# Patient Record
Sex: Male | Born: 1955 | Race: White | Hispanic: No | Marital: Married | Smoking: Never smoker
Health system: Southern US, Community
[De-identification: ages and names within clinical notes are randomized; demographics above are authoritative.]

## PROBLEM LIST (undated history)

## (undated) DIAGNOSIS — I499 Cardiac arrhythmia, unspecified: Secondary | ICD-10-CM

## (undated) DIAGNOSIS — I509 Heart failure, unspecified: Secondary | ICD-10-CM

## (undated) DIAGNOSIS — I4891 Unspecified atrial fibrillation: Secondary | ICD-10-CM

## (undated) DIAGNOSIS — G4730 Sleep apnea, unspecified: Secondary | ICD-10-CM

## (undated) DIAGNOSIS — J302 Other seasonal allergic rhinitis: Secondary | ICD-10-CM

## (undated) DIAGNOSIS — Z87442 Personal history of urinary calculi: Secondary | ICD-10-CM

## (undated) DIAGNOSIS — N400 Enlarged prostate without lower urinary tract symptoms: Secondary | ICD-10-CM

## (undated) DIAGNOSIS — K52832 Lymphocytic colitis: Secondary | ICD-10-CM

## (undated) DIAGNOSIS — E669 Obesity, unspecified: Secondary | ICD-10-CM

## (undated) DIAGNOSIS — K219 Gastro-esophageal reflux disease without esophagitis: Secondary | ICD-10-CM

## (undated) DIAGNOSIS — E782 Mixed hyperlipidemia: Secondary | ICD-10-CM

## (undated) DIAGNOSIS — I4819 Other persistent atrial fibrillation: Secondary | ICD-10-CM

## (undated) DIAGNOSIS — N200 Calculus of kidney: Secondary | ICD-10-CM

## (undated) DIAGNOSIS — IMO0002 Reserved for concepts with insufficient information to code with codable children: Secondary | ICD-10-CM

## (undated) DIAGNOSIS — I10 Essential (primary) hypertension: Secondary | ICD-10-CM

## (undated) HISTORY — DX: Lymphocytic colitis: K52.832

## (undated) HISTORY — DX: Calculus of kidney: N20.0

## (undated) HISTORY — DX: Obesity, unspecified: E66.9

## (undated) HISTORY — DX: Benign prostatic hyperplasia without lower urinary tract symptoms: N40.0

## (undated) HISTORY — DX: Essential (primary) hypertension: I10

## (undated) HISTORY — DX: Other seasonal allergic rhinitis: J30.2

## (undated) HISTORY — DX: Gastro-esophageal reflux disease without esophagitis: K21.9

## (undated) HISTORY — DX: Reserved for concepts with insufficient information to code with codable children: IMO0002

## (undated) HISTORY — DX: Mixed hyperlipidemia: E78.2

## (undated) HISTORY — DX: Other persistent atrial fibrillation: I48.19

---

## 2007-10-01 ENCOUNTER — Emergency Department (HOSPITAL_COMMUNITY): Admission: EM | Admit: 2007-10-01 | Discharge: 2007-10-01 | Payer: Self-pay | Admitting: Emergency Medicine

## 2008-01-12 ENCOUNTER — Ambulatory Visit (HOSPITAL_COMMUNITY): Admission: RE | Admit: 2008-01-12 | Discharge: 2008-01-13 | Payer: Self-pay | Admitting: Surgery

## 2008-09-23 ENCOUNTER — Emergency Department (HOSPITAL_COMMUNITY): Admission: EM | Admit: 2008-09-23 | Discharge: 2008-09-23 | Payer: Self-pay | Admitting: Emergency Medicine

## 2008-09-26 ENCOUNTER — Encounter: Admission: RE | Admit: 2008-09-26 | Discharge: 2008-09-26 | Payer: Self-pay | Admitting: Orthopedic Surgery

## 2008-09-27 ENCOUNTER — Encounter: Admission: RE | Admit: 2008-09-27 | Discharge: 2008-09-27 | Payer: Self-pay | Admitting: Orthopedic Surgery

## 2010-06-09 NOTE — Op Note (Signed)
[name], [name]                 ACCOUNT NO.:  [id number]   MEDICAL RECORD NO.:  [id number]          PATIENT TYPE:  OIB   LOCATION:  [id number]                         FACILITY:  [facility]   PHYSICIAN:  Isabel Caprice. Hassell Done, MD  DATE OF BIRTH:  Apr 02, 1955   DATE OF PROCEDURE:  01/12/2008  DATE OF DISCHARGE:                               OPERATIVE REPORT   PREOPERATIVE INDICATIONS:  Recurrent left inguinal hernia.  The patient  is 55 year old white male who had bilateral inguinal hernia repairs in  1992 by Dr. Elesa Hacker, done open.  He has had a bulging mass in his left  groin getting bigger for the last 2 years.   PROCEDURE:  Laparoscopic left inguinal hernia repair with extra large  Davol 3DMax mesh (large on back order).   SURGEON:  Isabel Caprice. Hassell Done, M.D.   ASSISTANT:  None.   ANESTHESIA:  General.   DESCRIPTION OF PROCEDURE:  [name] was taken to room-1 on Friday,  January 12, 2008, and given general anesthesia.  The abdomen was  prepped with a Techni-Care equivalent by using a chlorhexidine base  prep.  Previously he had clipped himself the night before.  I made a  transverse incision slightly to the left of the umbilicus and did a  digital dissection down along the rectus and then inserted the balloon  dissector.  The scope was then inserted behind that and it was  insufflated.  I got a reasonably good dissection, although because he  had bilateral hernias, there was a rent on the right side that enabled  gas to get into the peritoneum so we had a pneumoperitoneum.  I  counteracted that with a Veress needle placed on the left side which  adequately deflated the abdomen.  I then put my two 5-mm trocars in  place under laparoscopic vision and with those I then began dissecting  this large hernia that was bulging out about the size between a plum and  an orange size mass in his left groin.  Initially it did not want to  come back but I went ahead and worked diligently on the  inside and  gently tugged and eventually got complete reduction of this mass, which  likely was more probably omental material that was stuck up in that  area.  It left this ring-sized defect which I have photographed and  placed in the chart with the large sac.  The sac went back out into this  little sac but the ring was very visible.  I then had everything  dissected nicely.  Because the large 3DMax mesh was on back order, I got  an extra large left.  I inserted this, tacked it across the midline, and  tacked along Cooper ligament as well as all around the hernia defect and  got good coverage.  Laterally where ever I tacked I could feel where the  tacker was so as not to get the lateral femoral cutaneous nerve.  Good  deployment and good coverage of the hernia defect was present.  It was  interesting, in dissecting the sac  I found what appeared to be a Prolene  suture that had actually broke and the knot appeared intact.   Area looked to be good.  There was no bleeding noted.  I then deflated  the abdomen and the preperitoneal space.  The fascial defect on the left  side was closed with a 0 Vicryl and then the wounds were closed with 4-0  Vicryl with  Benzoin and Steri-Strips.  The patient seemed to tolerate the procedure  well and was taken to the recovery room in satisfactory condition.  He  will be given a prescription for Tylox to take if needed for pain.  He  will be followed up in the office in a couple of weeks.      Isabel Caprice Hassell Done, MD  Electronically Signed     MBM/MEDQ  D:  01/12/2008  T:  01/13/2008  Job:  [id number]   cc:   Delanna Ahmadi, M.D.  Fax: [phone]

## 2010-10-30 LAB — BASIC METABOLIC PANEL
CO2: 26 mEq/L (ref 19–32)
Calcium: 9 mg/dL (ref 8.4–10.5)
Chloride: 107 mEq/L (ref 96–112)
Potassium: 4.1 mEq/L (ref 3.5–5.1)
Sodium: 138 mEq/L (ref 135–145)

## 2010-10-30 LAB — HEMOGLOBIN AND HEMATOCRIT, BLOOD
HCT: 42.6 % (ref 39.0–52.0)
Hemoglobin: 14.3 g/dL (ref 13.0–17.0)

## 2011-07-26 DIAGNOSIS — K52832 Lymphocytic colitis: Secondary | ICD-10-CM

## 2011-07-26 HISTORY — DX: Lymphocytic colitis: K52.832

## 2012-01-26 DIAGNOSIS — N400 Enlarged prostate without lower urinary tract symptoms: Secondary | ICD-10-CM

## 2012-01-26 HISTORY — DX: Benign prostatic hyperplasia without lower urinary tract symptoms: N40.0

## 2012-04-25 DIAGNOSIS — N200 Calculus of kidney: Secondary | ICD-10-CM

## 2012-04-25 HISTORY — DX: Calculus of kidney: N20.0

## 2015-02-20 ENCOUNTER — Other Ambulatory Visit: Payer: Self-pay | Admitting: Internal Medicine

## 2015-02-20 ENCOUNTER — Ambulatory Visit
Admission: RE | Admit: 2015-02-20 | Discharge: 2015-02-20 | Disposition: A | Discharge status: Home / Self Care | Payer: BLUE CROSS/BLUE SHIELD | Source: Ambulatory Visit | Attending: Internal Medicine | Admitting: Internal Medicine

## 2015-02-20 DIAGNOSIS — M25571 Pain in right ankle and joints of right foot: Secondary | ICD-10-CM

## 2015-08-08 DIAGNOSIS — R972 Elevated prostate specific antigen [PSA]: Secondary | ICD-10-CM | POA: Diagnosis not present

## 2015-08-08 DIAGNOSIS — N401 Enlarged prostate with lower urinary tract symptoms: Secondary | ICD-10-CM | POA: Diagnosis not present

## 2015-08-08 DIAGNOSIS — R351 Nocturia: Secondary | ICD-10-CM | POA: Diagnosis not present

## 2015-08-12 DIAGNOSIS — I10 Essential (primary) hypertension: Secondary | ICD-10-CM | POA: Diagnosis not present

## 2015-09-02 DIAGNOSIS — E782 Mixed hyperlipidemia: Secondary | ICD-10-CM | POA: Diagnosis not present

## 2015-09-02 DIAGNOSIS — K219 Gastro-esophageal reflux disease without esophagitis: Secondary | ICD-10-CM | POA: Diagnosis not present

## 2015-09-02 DIAGNOSIS — Z0000 Encounter for general adult medical examination without abnormal findings: Secondary | ICD-10-CM | POA: Diagnosis not present

## 2015-09-02 DIAGNOSIS — I10 Essential (primary) hypertension: Secondary | ICD-10-CM | POA: Diagnosis not present

## 2015-09-08 DIAGNOSIS — M25571 Pain in right ankle and joints of right foot: Secondary | ICD-10-CM | POA: Diagnosis not present

## 2016-01-13 DIAGNOSIS — J9801 Acute bronchospasm: Secondary | ICD-10-CM | POA: Diagnosis not present

## 2016-01-13 DIAGNOSIS — J209 Acute bronchitis, unspecified: Secondary | ICD-10-CM | POA: Diagnosis not present

## 2016-03-12 ENCOUNTER — Other Ambulatory Visit: Payer: Self-pay | Admitting: Internal Medicine

## 2016-03-12 DIAGNOSIS — I480 Paroxysmal atrial fibrillation: Secondary | ICD-10-CM

## 2016-03-16 NOTE — Progress Notes (Signed)
Cardiology Office Note   Date:  03/26/2016   ID:  [name], DOB [birth year], MRN [id number]  PCP:  Irven Shelling, MD  Cardiologist:   Jenkins Rouge, MD   Chief Complaint  Patient presents with  . Atrial Fibrillation  . Establish Care      History of Present Illness: [name] is a 61 y.o. male who presents for evaluation of afib.  Seen by Dr Laurann Montana 03/09/16 started on cardizem And eliquis  History of HTN and asthma .  LDL is 119.  Cr 1.25 K 4.0 PLT 214 Hct 41.6 TSH 1.14 03/03/16   Echo 03/17/16 EF 25-30%  Diffuse hypokinesis Root 40 mm Mild MR  LA/RA  severely dilated  RV moderately dilated    Dyspnea much worse last 2 months no chest pain palpitations or syncope does have PND and orthopnea with LE edema  Has been taking eliquis Since 03/03/16  No bleeding issues. No family history of DCM. Denies ETOH.    Long discussion with him about afib. Strategies of rate control, anticoagulation and rhythm control This patients CHA2DS2-VASc Score and unadjusted Ischemic Stroke Rate (% per year) is equal to 2.2 % stroke rate/year from a score of 2  Above score calculated as 1 point each if present [CHF, HTN, DM, Vascular=MI/PAD/Aortic Plaque, Age if 65-74, or Male] Above score calculated as 2 points each if present [Age > 75, or Stroke/TIA/TE]  Also long discussion about diagnosis of CHF and cardiomyopathy. Need for ischemic w/u and conversion To NSR if possible for AV synchrony and improved EF   Past Medical History:  Diagnosis Date  . Asthma, persistent   . BPH (benign prostatic hyperplasia) 2014  . Combined hyperlipidemia   . GERD (gastroesophageal reflux disease)   . Hypertension   . 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 Obesity   . Renal stone 04/2012  . Seasonal allergic rhinitis     History reviewed. No pertinent surgical history.   Current Outpatient Prescriptions  Medication Sig Dispense Refill  . albuterol (PROVENTIL HFA;VENTOLIN HFA) 108  (90 Base) MCG/ACT inhaler Inhale 2 puffs into the lungs every 6 (six) hours as needed for wheezing or shortness of breath.    Marland Kitchen apixaban (ELIQUIS) 5 MG TABS tablet Take 5 mg by mouth 2 (two) times daily.    . Avanafil 200 MG TABS Take 1 tablet by mouth daily as needed.    . carvedilol (COREG) 6.25 MG tablet Take 1 tablet (6.25 mg total) by mouth 2 (two) times daily. 180 tablet 3  . cholestyramine (QUESTRAN) 4 GM/DOSE powder Take 4 g by mouth 2 (two) times daily as needed.    . fluticasone furoate-vilanterol (BREO ELLIPTA) 100-25 MCG/INH AEPB Inhale 1 puff into the lungs daily.    . irbesartan (AVAPRO) 300 MG tablet Take 300 mg by mouth daily.    . pantoprazole (PROTONIX) 40 MG tablet Take 40 mg by mouth daily.    . furosemide (LASIX) 20 MG tablet Take 1 tablet (20 mg total) by mouth daily. 90 tablet 3   No current facility-administered medications for this visit.     Allergies:   Lisinopril    Social History:  The patient  reports that he has never smoked. He has never used smokeless tobacco. He reports that he does not drink alcohol or use drugs.   Family History:  The patient's family history is not on file.    ROS:  Please see the history of present illness.  Otherwise, review of systems are positive for none.   All other systems are reviewed and negative.    PHYSICAL EXAM: VS:  BP 126/90   Pulse 71   Ht 6' (1.829 m)   Wt 244 lb 6.4 oz (110.9 kg)   SpO2 97%   BMI 33.15 kg/m  , BMI Body mass index is 33.15 kg/m. Affect appropriate Healthy:  appears stated age 57: normal Neck supple with no adenopathy JVP normal no bruits no thyromegaly Lungs clear with no wheezing and good diaphragmatic motion Heart:  S1/S2 no murmur, no rub, gallop or click PMI normal Abdomen: benighn, BS positve, no tenderness, no AAA no bruit.  No HSM or HJR Distal pulses intact with no bruits Plus one bilateral edema Neuro non-focal Skin warm and dry No muscular weakness    EKG:  Afib rate  97 ICRBBB no MI    Recent Labs: No results found for requested labs within last 8760 hours.    Lipid Panel No results found for: CHOL, TRIG, HDL, CHOLHDL, VLDL, LDLCALC, LDLDIRECT    Wt Readings from Last 3 Encounters:  03/26/16 244 lb 6.4 oz (110.9 kg)      Other studies Reviewed: Additional studies/ records that were reviewed today include: Notes [facility] Laurann Montana labs .    ASSESSMENT AND PLAN:  1.  Afib: stop cardizem increase coreg to 6.25 bid continue eliquis. Scheduled [place] for 04/07/16 Will be 5 weeks on eliquis. Risk of stroke pacing and need for intubation discussed Endoscopy Called and scheduled for 3/14 2:00 pm   2. Decreased EF continue Avapro cough with ACE increase coreg add lasix 20 mg will need myovue after [place]   3. Dyspnea previously related to asthma now afib and DCM see med changes above   4. Anticoagulation  Continue eliquis on since 03/03/16 has used samples from Dr Laurann Montana  5. HTN:  .nlp  6. Lipids  On [place] labs with Dr Laurann Montana    Current medicines are reviewed at length with the patient today.  The patient does not have concerns regarding medicines.  The following changes have been made:  See above   Labs/ tests ordered today include: Pre [facility] labs   Orders Placed This Encounter  Procedures  . Protime-INR  . CBC with Differential/Platelet  . Basic metabolic panel  . EKG 12-Lead     Disposition:   FU with me post [facility]      Signed, Jenkins Rouge, MD  03/26/2016 2:55 PM    [facility] [place], [place], [place]  [address] Phone: [phone]; Fax: [phone]

## 2016-03-17 ENCOUNTER — Encounter (HOSPITAL_COMMUNITY): Payer: Self-pay

## 2016-03-17 ENCOUNTER — Ambulatory Visit (HOSPITAL_COMMUNITY): Payer: Self-pay | Attending: Internal Medicine

## 2016-03-17 ENCOUNTER — Encounter: Payer: Self-pay | Admitting: Cardiovascular Disease

## 2016-03-17 ENCOUNTER — Other Ambulatory Visit: Payer: Self-pay

## 2016-03-17 DIAGNOSIS — I77819 Aortic ectasia, unspecified site: Secondary | ICD-10-CM | POA: Insufficient documentation

## 2016-03-17 DIAGNOSIS — I480 Paroxysmal atrial fibrillation: Secondary | ICD-10-CM | POA: Insufficient documentation

## 2016-03-17 DIAGNOSIS — I517 Cardiomegaly: Secondary | ICD-10-CM | POA: Insufficient documentation

## 2016-03-17 NOTE — Progress Notes (Signed)
[name] presented for echocardiogram this afternoon referred by Dr. [name] from [facility]. During the test, his HR was noted between 100 and 140 bpm with EF of 25%. DOD (Dr. Angelena Form) was notified and he recommended to call his primary doctor and see if there is another type of medicine he can take to control his HR and recommended to stick with his appointment with Dr. Johnsie Cancel on 03/26/16.  Wyatt Mage, [place] 03/17/2016

## 2016-03-26 ENCOUNTER — Encounter (INDEPENDENT_AMBULATORY_CARE_PROVIDER_SITE_OTHER): Payer: Self-pay

## 2016-03-26 ENCOUNTER — Encounter: Payer: Self-pay | Admitting: Cardiovascular Disease

## 2016-03-26 ENCOUNTER — Ambulatory Visit (INDEPENDENT_AMBULATORY_CARE_PROVIDER_SITE_OTHER): Payer: PRIVATE HEALTH INSURANCE | Admitting: Cardiovascular Disease

## 2016-03-26 VITALS — BP 126/90 | HR 71 | Ht 72.0 in | Wt 244.4 lb

## 2016-03-26 DIAGNOSIS — I4891 Unspecified atrial fibrillation: Secondary | ICD-10-CM

## 2016-03-26 DIAGNOSIS — Z01812 Encounter for preprocedural laboratory examination: Secondary | ICD-10-CM

## 2016-03-26 DIAGNOSIS — Z7689 Persons encountering health services in other specified circumstances: Secondary | ICD-10-CM

## 2016-03-26 MED ORDER — CARVEDILOL 6.25 MG PO TABS: 6.2500 mg | ORAL_TABLET | Freq: Two times a day (BID) | ORAL | 3 refills | Status: DC

## 2016-03-26 MED ORDER — FUROSEMIDE 20 MG PO TABS: 20.0000 mg | ORAL_TABLET | Freq: Every day | ORAL | 3 refills | Status: DC

## 2016-03-26 NOTE — Patient Instructions (Addendum)
Medication Instructions:  Your physician has recommended you make the following change in your medication:  1-STOP cardizem 2-INCREASE Coreg 6.25 mg by mouth twice daily 3-START Lasix 20 mg by mouth daily   Labwork: Your physician recommends that you return for lab work on 04/05/16 for BMET, CBC, PT/INR  Testing/Procedures: Your physician has recommended that you have a Cardioversion (DCCV). Electrical Cardioversion uses a jolt of electricity to your heart either through paddles or wired patches attached to your chest. This is a controlled, usually prescheduled, procedure. Defibrillation is done under light anesthesia in the hospital, and you usually go home the day of the procedure. This is done to get your heart back into a normal rhythm. You are not awake for the procedure. Please see the instruction sheet given to you today.  Follow-Up: Your physician wants you to follow-up in: 1 month with Dr. Johnsie Cancel or PA/NP.   If you need a refill on your cardiac medications before your next appointment, please call your pharmacy.

## 2016-03-29 ENCOUNTER — Telehealth: Payer: Self-pay | Admitting: Cardiovascular Disease

## 2016-03-29 DIAGNOSIS — R0602 Shortness of breath: Secondary | ICD-10-CM

## 2016-03-29 NOTE — Telephone Encounter (Signed)
Patient is having problems breathing when he lays down at night. Patient stated this is not new, but has not improved with the medication changes at his office visit last Friday.  1-STOP cardizem 2-INCREASE Coreg 6.25 mg by mouth twice daily 3-START Lasix 20 mg by mouth daily   Patient stated that one morning he took coreg early at 2:00 am and that did help. Patient stated he is fine through out the day when he is ambulating or sitting. Patient was wondering if his dose needed increasing. Will forward to Dr. Johnsie Cancel for advisement.

## 2016-03-29 NOTE — Telephone Encounter (Signed)
No have him come in for CXR BMET and BNP first

## 2016-03-29 NOTE — Telephone Encounter (Signed)
Left message for patient to call back  

## 2016-03-29 NOTE — Telephone Encounter (Signed)
New Message:   Pt says he can not sleep at night ,when he laid down he can not breathe.Pt wonder if his medicine needs to be changed?

## 2016-03-29 NOTE — Telephone Encounter (Signed)
Patient will come in for lab work tomorrow and have chest xray done tomorrow as well.

## 2016-03-30 ENCOUNTER — Other Ambulatory Visit (INDEPENDENT_AMBULATORY_CARE_PROVIDER_SITE_OTHER): Payer: PRIVATE HEALTH INSURANCE

## 2016-03-30 ENCOUNTER — Ambulatory Visit
Admission: RE | Admit: 2016-03-30 | Discharge: 2016-03-30 | Disposition: A | Discharge status: Home / Self Care | Payer: PRIVATE HEALTH INSURANCE | Source: Ambulatory Visit | Attending: Cardiovascular Disease | Admitting: Cardiovascular Disease

## 2016-03-30 DIAGNOSIS — R0602 Shortness of breath: Secondary | ICD-10-CM

## 2016-03-30 DIAGNOSIS — Z01812 Encounter for preprocedural laboratory examination: Secondary | ICD-10-CM

## 2016-03-30 DIAGNOSIS — I4891 Unspecified atrial fibrillation: Secondary | ICD-10-CM | POA: Diagnosis not present

## 2016-03-31 ENCOUNTER — Telehealth: Payer: Self-pay | Admitting: Cardiovascular Disease

## 2016-03-31 ENCOUNTER — Encounter (HOSPITAL_COMMUNITY): Payer: Self-pay | Admitting: Emergency Medicine

## 2016-03-31 ENCOUNTER — Emergency Department (HOSPITAL_COMMUNITY): Payer: PRIVATE HEALTH INSURANCE

## 2016-03-31 ENCOUNTER — Emergency Department (HOSPITAL_COMMUNITY)
Admission: EM | Admit: 2016-03-31 | Discharge: 2016-03-31 | Disposition: A | Discharge status: Home / Self Care | Payer: PRIVATE HEALTH INSURANCE | Attending: Emergency Medicine | Admitting: Emergency Medicine

## 2016-03-31 DIAGNOSIS — I10 Essential (primary) hypertension: Secondary | ICD-10-CM | POA: Diagnosis not present

## 2016-03-31 DIAGNOSIS — I4891 Unspecified atrial fibrillation: Secondary | ICD-10-CM | POA: Diagnosis not present

## 2016-03-31 DIAGNOSIS — R0602 Shortness of breath: Secondary | ICD-10-CM | POA: Diagnosis present

## 2016-03-31 DIAGNOSIS — Z7901 Long term (current) use of anticoagulants: Secondary | ICD-10-CM | POA: Insufficient documentation

## 2016-03-31 DIAGNOSIS — J45909 Unspecified asthma, uncomplicated: Secondary | ICD-10-CM | POA: Insufficient documentation

## 2016-03-31 DIAGNOSIS — Z79899 Other long term (current) drug therapy: Secondary | ICD-10-CM | POA: Insufficient documentation

## 2016-03-31 LAB — CBC WITH DIFFERENTIAL/PLATELET
BASOS: 0 %
Basophils Absolute: 0 10*3/uL (ref 0.0–0.2)
Basophils Absolute: 0 10*3/uL (ref 0.0–0.1)
Basophils Relative: 0 %
EOS (ABSOLUTE): 0.1 10*3/uL (ref 0.0–0.4)
EOS: 1 %
Eosinophils Absolute: 0 10*3/uL (ref 0.0–0.7)
Eosinophils Relative: 0 %
HCT: 42.4 % (ref 39.0–52.0)
HEMATOCRIT: 40.7 % (ref 37.5–51.0)
Hemoglobin: 13.5 g/dL (ref 13.0–17.7)
Hemoglobin: 14.1 g/dL (ref 13.0–17.0)
IMMATURE GRANS (ABS): 0 10*3/uL (ref 0.0–0.1)
IMMATURE GRANULOCYTES: 0 %
Lymphocytes Absolute: 1.1 10*3/uL (ref 0.7–3.1)
Lymphocytes Relative: 18 %
Lymphs Abs: 1.5 10*3/uL (ref 0.7–4.0)
Lymphs: 17 %
MCH: 29 pg (ref 26.6–33.0)
MCH: 29.6 pg (ref 26.0–34.0)
MCHC: 33.2 g/dL (ref 31.5–35.7)
MCHC: 33.3 g/dL (ref 30.0–36.0)
MCV: 88 fL (ref 79–97)
MCV: 88.9 fL (ref 78.0–100.0)
MONOS ABS: 0.8 10*3/uL (ref 0.1–0.9)
Monocytes Absolute: 0.7 10*3/uL (ref 0.1–1.0)
Monocytes Relative: 9 %
Monocytes: 12 %
NEUTROS ABS: 4.5 10*3/uL (ref 1.4–7.0)
NEUTROS PCT: 70 %
Neutro Abs: 6 10*3/uL (ref 1.7–7.7)
Neutrophils Relative %: 73 %
Platelets: 234 10*3/uL (ref 150–379)
Platelets: 227 10*3/uL (ref 150–400)
RBC: 4.65 x10E6/uL (ref 4.14–5.80)
RBC: 4.77 MIL/uL (ref 4.22–5.81)
RDW: 13.3 % (ref 12.3–15.4)
RDW: 13.4 % (ref 11.5–15.5)
WBC: 6.5 10*3/uL (ref 3.4–10.8)
WBC: 8.2 10*3/uL (ref 4.0–10.5)

## 2016-03-31 LAB — BASIC METABOLIC PANEL
Anion gap: 9 (ref 5–15)
BUN/Creatinine Ratio: 15 (ref 10–24)
BUN: 23 mg/dL (ref 8–27)
BUN: 25 mg/dL — ABNORMAL HIGH (ref 6–20)
CHLORIDE: 107 mmol/L — AB (ref 96–106)
CO2: 20 mmol/L (ref 18–29)
CO2: 25 mmol/L (ref 22–32)
CREATININE: 1.52 mg/dL — AB (ref 0.76–1.27)
Calcium: 8.6 mg/dL (ref 8.6–10.2)
Calcium: 9 mg/dL (ref 8.9–10.3)
Chloride: 109 mmol/L (ref 101–111)
Creatinine, Ser: 1.68 mg/dL — ABNORMAL HIGH (ref 0.61–1.24)
GFR calc Af Amer: 57 mL/min/{1.73_m2} — ABNORMAL LOW (ref >59)
GFR calc Af Amer: 49 mL/min — ABNORMAL LOW (ref >60)
GFR calc non Af Amer: 49 mL/min/{1.73_m2} — ABNORMAL LOW (ref >59)
GFR calc non Af Amer: 43 mL/min — ABNORMAL LOW (ref >60)
GLUCOSE: 95 mg/dL (ref 65–99)
Glucose, Bld: 89 mg/dL (ref 65–99)
Potassium: 4.2 mmol/L (ref 3.5–5.2)
Potassium: 4 mmol/L (ref 3.5–5.1)
SODIUM: 145 mmol/L — AB (ref 134–144)
Sodium: 143 mmol/L (ref 135–145)

## 2016-03-31 LAB — PROTIME-INR
INR: 1.2 (ref 0.8–1.2)
Prothrombin Time: 12.4 s — ABNORMAL HIGH (ref 9.1–12.0)

## 2016-03-31 LAB — TROPONIN I: Troponin I: 0.06 ng/mL (ref <0.03)

## 2016-03-31 LAB — PRO B NATRIURETIC PEPTIDE: NT-PRO BNP: 3730 pg/mL — AB (ref 0–210)

## 2016-03-31 LAB — MAGNESIUM: Magnesium: 2.1 mg/dL (ref 1.7–2.4)

## 2016-03-31 MED ORDER — DILTIAZEM LOAD VIA INFUSION
20.0000 mg | Freq: Once | INTRAVENOUS | Status: AC
  Administered 2016-03-31: 20 mg via INTRAVENOUS
  Filled 2016-03-31: qty 20

## 2016-03-31 MED ORDER — PROPOFOL 10 MG/ML IV BOLUS
0.5000 mg/kg | Freq: Once | INTRAVENOUS | Status: AC
  Administered 2016-03-31: 40 mg via INTRAVENOUS
  Filled 2016-03-31: qty 20

## 2016-03-31 MED ORDER — DILTIAZEM HCL 100 MG IV SOLR
5.0000 mg/h | INTRAVENOUS | Status: DC
  Administered 2016-03-31: 10 mg/h via INTRAVENOUS
  Filled 2016-03-31 (×2): qty 100

## 2016-03-31 NOTE — ED Notes (Signed)
Hooked patient to the monitor patient is resting 

## 2016-03-31 NOTE — Telephone Encounter (Signed)
Called patient about his symptoms. Dr. Johnsie Cancel reviewed patient's chest xray and lab work, and recommend patient to go to ED. Patient will go to ED at [facility]. Patient has new on set A. FIb and Heart Failure. Called ED nurse to inform them that patient is coming. Left message for Trish our office contact for cardiology at the hospital.

## 2016-03-31 NOTE — ED Notes (Signed)
Pharmacy notified of need for cardizem drip

## 2016-03-31 NOTE — ED Notes (Signed)
Dr. Wilson Singer cardiovert at 150J, pt still in a fib HR jumping around from 75-120 bpm in sinus rhythm and a fib. Dr. Wilson Singer verbalized to pause the cardizem and monitor the pt before cardioverting again.

## 2016-03-31 NOTE — ED Provider Notes (Signed)
[facility] DEPT Provider Note   By signing my name below, I, Bea Graff, attest that this documentation has been prepared under the direction and in the presence of Virgel Manifold, MD. Electronically Signed: Bea Graff, ED Scribe. 03/31/16. 4:01 PM.    History   Chief Complaint Chief Complaint  Patient presents with  . Shortness of Breath    The history is provided by the patient and medical records. No language interpreter was used.    [name] is a 61 y.o. male with PMHx of asthma, BPH, HLD, HTN and atrial fibrillation who presents to the Emergency Department complaining of SOB that began about 3 days ago. He reports associated orthopnea for the past three nights. He also reports some mild, intermittent CP and lower extremity swelling. He states he was just recently diagnosed with atrial fibrillation, has already had an ECG and is supposed to be cardioverted in one week by Dr. Johnsie Cancel. He states he called Dr. Johnsie Cancel today and was told to come to the ED. He has not done anything to treat his symptoms. Lying flat increases his SOB. He denies alleviating factors. He denies fever, chills, nausea, vomiting.Marland Kitchen He reports being on Eliquis for the last month approximately.    Past Medical History:  Diagnosis Date  . Asthma, persistent   . BPH (benign prostatic hyperplasia) 2014  . Combined hyperlipidemia   . GERD (gastroesophageal reflux disease)   . Hypertension   . 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 Obesity   . Renal stone 04/2012  . Seasonal allergic rhinitis     There are no active problems to display for this patient.   History reviewed. No pertinent surgical history.     Home Medications    Prior to Admission medications   Medication Sig Start Date End Date Taking? Authorizing Provider  albuterol (PROVENTIL HFA;VENTOLIN HFA) 108 (90 Base) MCG/ACT inhaler Inhale 2 puffs into the lungs every 6 (six) hours as needed for wheezing or shortness of  breath.    Historical Provider, MD  apixaban (ELIQUIS) 5 MG TABS tablet Take 5 mg by mouth 2 (two) times daily.    Historical Provider, MD  Avanafil 200 MG TABS Take 1 tablet by mouth daily as needed.    Historical Provider, MD  carvedilol (COREG) 6.25 MG tablet Take 1 tablet (6.25 mg total) by mouth 2 (two) times daily. 03/26/16   Josue Hector, MD  cholestyramine Lucrezia Starch) 4 GM/DOSE powder Take 4 g by mouth 2 (two) times daily as needed.    Historical Provider, MD  fluticasone furoate-vilanterol (BREO ELLIPTA) 100-25 MCG/INH AEPB Inhale 1 puff into the lungs daily.    Historical Provider, MD  furosemide (LASIX) 20 MG tablet Take 1 tablet (20 mg total) by mouth daily. 03/26/16 06/24/16  Josue Hector, MD  irbesartan (AVAPRO) 300 MG tablet Take 300 mg by mouth daily.    Historical Provider, MD  pantoprazole (PROTONIX) 40 MG tablet Take 40 mg by mouth daily.    Historical Provider, MD    Family History No family history on file.  Social History Social History  Substance Use Topics  . Smoking status: Never Smoker  . Smokeless tobacco: Never Used  . Alcohol use No     Allergies   Lisinopril   Review of Systems Review of Systems A complete 10 system review of systems was obtained and all systems are negative except as noted in the HPI and PMH.    Physical Exam Updated Vital Signs  BP (!) 126/111 (BP Location: Right Arm)   Pulse (!) 160   Temp 98.8 F (37.1 C) (Oral)   Resp 18   Ht 6' (1.829 m)   Wt 244 lb (110.7 kg)   SpO2 100%   BMI 33.09 kg/m   Physical Exam  Constitutional: He is oriented to person, place, and time. He appears well-developed and well-nourished.  HENT:  Head: Normocephalic.  Eyes: EOM are normal.  Neck: Normal range of motion.  Cardiovascular: An irregularly irregular rhythm present. Tachycardia present.   Markedly tachycardic. Symmetric pitting lower extremity edema.  Pulmonary/Chest: Effort normal and breath sounds normal. No respiratory distress. He  has no wheezes. He has no rales.  Abdominal: He exhibits no distension.  Musculoskeletal: Normal range of motion.  Neurological: He is alert and oriented to person, place, and time.  Psychiatric: He has a normal mood and affect.  Nursing note and vitals reviewed.    ED Treatments / Results  DIAGNOSTIC STUDIES: Oxygen Saturation is 100% on RA, normal by my interpretation.   COORDINATION OF CARE: 1:41 PM- Will consult cardiology. Pt verbalizes understanding and agrees to plan.  Medications  diltiazem (CARDIZEM) 1 mg/mL load via infusion 20 mg (20 mg Intravenous Bolus from Bag 03/31/16 1439)    And  diltiazem (CARDIZEM) 100 mg in dextrose 5 % 100 mL (1 mg/mL) infusion (0 mg/hr Intravenous Paused 03/31/16 1515)  propofol (DIPRIVAN) 10 mg/mL bolus/IV push 55.4 mg (40 mg Intravenous Given 03/31/16 1517)    Labs (all labs ordered are listed, but only abnormal results are displayed) Labs Reviewed  BASIC METABOLIC PANEL - Abnormal; Notable for the following:       Result Value   BUN 25 (*)    Creatinine, Ser 1.68 (*)    GFR calc non Af Amer 43 (*)    GFR calc Af Amer 49 (*)    All other components within normal limits  TROPONIN I - Abnormal; Notable for the following:    Troponin I 0.06 (*)    All other components within normal limits  CBC WITH DIFFERENTIAL/PLATELET  MAGNESIUM    EKG  EKG Interpretation  Date/Time:  Wednesday March 31 2016 13:14:15 EST Ventricular Rate:  157 PR Interval:    QRS Duration: 96 QT Interval:  312 QTC Calculation: 504 R Axis:   93 Text Interpretation:  Atrial fibrillation with rapid ventricular response Rightward axis Incomplete right bundle branch block Abnormal ECG Confirmed by Wilson Singer  MD, Zaiyden Strozier [phone]) on 03/31/2016 2:36:28 PM       Radiology Dg Chest 2 View  Result Date: 03/30/2016 CLINICAL DATA:  Preprocedural evaluation, cardioversion. History of atrial fibrillation, shortness of breath, hypertension. EXAM: CHEST  2 VIEW COMPARISON:  Chest  radiograph January 12, 2008 FINDINGS: The cardiac silhouette is mildly enlarged. Mediastinal silhouette is nonsuspicious. Strandy densities LEFT lung base. Pleural thickening RIGHT fissure without pleural effusion. No pneumothorax. Wire fragments project over LEFT scapula, unchanged. Osseous structures are nonsuspicious. IMPRESSION: Mild cardiomegaly.  LEFT lung base atelectasis/ scarring. Electronically Signed   By: Elon Alas M.D.   On: 03/30/2016 14:17   Dg Chest Portable 1 View  Result Date: 03/31/2016 CLINICAL DATA:  Shortness of breath EXAM: PORTABLE CHEST 1 VIEW COMPARISON:  Chest x-ray of 03/30/2016 FINDINGS: Mild left basilar atelectasis or scarring remains. No definite pneumonia is seen. A tiny left pleural effusion cannot be excluded. Cardiomegaly is stable. IMPRESSION: 1. Mild left basilar atelectasis. 2. Cannot exclude small left pleural effusion. Electronically Signed   By:  Ivar Drape M.D.   On: 03/31/2016 14:32    Procedures .Cardioversion Date/Time: 03/31/2016 3:16 PM Performed by: Virgel Manifold Authorized by: Virgel Manifold   Consent:    Consent obtained:  Written   Consent given by:  Patient   Alternatives discussed:  Rate-control medication and anti-coagulation medication Pre-procedure details:    Cardioversion basis:  Elective   Rhythm:  Atrial fibrillation Attempt one:    Cardioversion mode:  Synchronous   Waveform:  Biphasic   Shock (Joules):  150   Shock outcome:  Conversion to normal sinus rhythm Post-procedure details:    Patient status:  Awake   Patient tolerance of procedure:  Tolerated well, no immediate complications       Procedural sedation Performed by: Wilson Singer MD, Ellesse Antenucci Consent: Verbal consent obtained. Risks and benefits: risks, benefits and alternatives were discussed Required items: required blood products, implants, devices, and special equipment available Patient identity confirmed: arm band and provided demographic data Time out:  Immediately prior to procedure a "time out" was called to verify the correct patient, procedure, equipment, support staff and site/side marked as required.  Sedation type: moderate (conscious) sedation NPO time confirmed and considedered  Sedatives: propofol  Physician Time at Bedside: 35  Vitals: Vital signs were monitored during sedation. Cardiac Monitor, pulse oximeter Patient tolerance: Patient tolerated the procedure well with no immediate complications. Comments: Pt with uneventful recovered. Returned to pre-procedural sedation baseline  CRITICAL CARE Performed by: Virgel Manifold Total critical care time: 35 minutes Critical care time was exclusive of separately billable procedures and treating other patients. Critical care was necessary to treat or prevent imminent or life-threatening deterioration. Critical care was time spent personally by me on the following activities: development of treatment plan with patient and/or surrogate as well as nursing, discussions with consultants, evaluation of patient's response to treatment, examination of patient, obtaining history from patient or surrogate, ordering and performing treatments and interventions, ordering and review of laboratory studies, ordering and review of radiographic studies, pulse oximetry and re-evaluation of patient's condition.   (including critical care time)  Medications Ordered in ED Medications  diltiazem (CARDIZEM) 1 mg/mL load via infusion 20 mg (20 mg Intravenous Bolus from Bag 03/31/16 1439)    And  diltiazem (CARDIZEM) 100 mg in dextrose 5 % 100 mL (1 mg/mL) infusion (0 mg/hr Intravenous Paused 03/31/16 1515)  propofol (DIPRIVAN) 10 mg/mL bolus/IV push 55.4 mg (40 mg Intravenous Given 03/31/16 1517)     Initial Impression / Assessment and Plan / ED Course  I have reviewed the triage vital signs and the nursing notes.  Pertinent labs & imaging results that were available during my care of the patient were  reviewed by me and considered in my medical decision making (see chart for details).     81yM with newly diagnosed afib. Seen by PCP last month and started on cardizem/eliquis. Had ECHO and followed up with cardiology. Cardizem stopped and Coreg to 6.25 BID. Scheduled for cardioversion on 3/14. Now in afib with HR 140-160 and pretty symptomatic. Will discuss with cards with regards to continued medical management versus going ahead and cardioverting today.   Has been anticoagulated 3+ weeks. Pt cardioverted. Feels much better. Minimal troponin elevation noted. Suspect demand ischemia. All symptoms resolved after restoration of sinus rhythm. Close outpt cardiology FU.   I personally preformed the services scribed in my presence. The recorded information has been reviewed is accurate. Virgel Manifold, MD.   Final Clinical Impressions(s) / ED Diagnoses   Final  diagnoses:  Atrial fibrillation with rapid ventricular response [facility])    New Prescriptions New Prescriptions   No medications on file     Virgel Manifold, MD 04/13/16 1329

## 2016-03-31 NOTE — Telephone Encounter (Signed)
Pt c/o Shortness Of Breath: STAT if SOB developed within the last 24 hours or pt is noticeably SOB on the phone  1. Are you currently SOB (can you hear that pt is SOB on the phone)? No  2. How long have you been experiencing SOB? Last night  3. Are you SOB when sitting or when up moving around? Laying down   4. Are you currently experiencing any other symptoms? no

## 2016-03-31 NOTE — ED Triage Notes (Signed)
Pt has scheduled cardioversion with Jenkins Rouge MD on 3/14. For the last 2 days pt has been feeling sob and was told my MD to come to ED. Pt in afib on arrival rate 150-160.

## 2016-04-01 ENCOUNTER — Telehealth: Payer: Self-pay | Admitting: *Deleted

## 2016-04-01 ENCOUNTER — Encounter (HOSPITAL_COMMUNITY): Payer: Self-pay | Admitting: Nurse Practitioner

## 2016-04-01 ENCOUNTER — Telehealth: Payer: Self-pay | Admitting: Cardiovascular Disease

## 2016-04-01 ENCOUNTER — Ambulatory Visit (HOSPITAL_COMMUNITY)
Admission: RE | Admit: 2016-04-01 | Discharge: 2016-04-01 | Disposition: A | Discharge status: Home / Self Care | Payer: PRIVATE HEALTH INSURANCE | Source: Ambulatory Visit | Attending: Nurse Practitioner | Admitting: Nurse Practitioner

## 2016-04-01 VITALS — BP 130/90 | HR 79 | Ht 72.0 in | Wt 240.8 lb

## 2016-04-01 DIAGNOSIS — R0602 Shortness of breath: Secondary | ICD-10-CM

## 2016-04-01 DIAGNOSIS — E669 Obesity, unspecified: Secondary | ICD-10-CM | POA: Insufficient documentation

## 2016-04-01 DIAGNOSIS — Z79899 Other long term (current) drug therapy: Secondary | ICD-10-CM | POA: Diagnosis not present

## 2016-04-01 DIAGNOSIS — Z888 Allergy status to other drugs, medicaments and biological substances status: Secondary | ICD-10-CM | POA: Diagnosis not present

## 2016-04-01 DIAGNOSIS — J45909 Unspecified asthma, uncomplicated: Secondary | ICD-10-CM | POA: Insufficient documentation

## 2016-04-01 DIAGNOSIS — E782 Mixed hyperlipidemia: Secondary | ICD-10-CM | POA: Insufficient documentation

## 2016-04-01 DIAGNOSIS — Z87442 Personal history of urinary calculi: Secondary | ICD-10-CM | POA: Insufficient documentation

## 2016-04-01 DIAGNOSIS — I4891 Unspecified atrial fibrillation: Secondary | ICD-10-CM | POA: Insufficient documentation

## 2016-04-01 DIAGNOSIS — N400 Enlarged prostate without lower urinary tract symptoms: Secondary | ICD-10-CM | POA: Diagnosis not present

## 2016-04-01 DIAGNOSIS — K219 Gastro-esophageal reflux disease without esophagitis: Secondary | ICD-10-CM | POA: Insufficient documentation

## 2016-04-01 DIAGNOSIS — Z7901 Long term (current) use of anticoagulants: Secondary | ICD-10-CM | POA: Diagnosis not present

## 2016-04-01 DIAGNOSIS — I10 Essential (primary) hypertension: Secondary | ICD-10-CM | POA: Diagnosis not present

## 2016-04-01 MED ORDER — FUROSEMIDE 20 MG PO TABS: 20.0000 mg | ORAL_TABLET | ORAL | 3 refills | Status: DC

## 2016-04-01 NOTE — Telephone Encounter (Signed)
Patient notified 04/01/16

## 2016-04-01 NOTE — Telephone Encounter (Signed)
-----   Message from Juluis Mire, RN sent at 04/01/2016  1:59 PM EST ----- Regarding: sleep study Pt needs sleep study for afib. Pt will be expecting call. Thanks! [facility]

## 2016-04-01 NOTE — Telephone Encounter (Signed)
Called patient about his questions. Patient stated he slept fine last night after discharge from ED. Patient stated today he is having SOB again, and is concerned. Asked patient to get his BP and HR and our office would call him back. Consulted [place] PA and she recommends patient coming in for an EKG. Will forward to triage.

## 2016-04-01 NOTE — Telephone Encounter (Signed)
Spoke w patient. He felt like he was back in A Fib this morning, having some Shortness of breath. Did not have HR or BP readings when we spoke but had talked w Pam and was aware of potential plan for EKG visit. I discussed w him and informed him we would find opening on APP or RN schedule for evaluation - informed him APP would be most appropriate -- given recent cardioversion and recurrence of symptoms, he would likely need med titration, referral for EP consult, etc.  Called A Fib clinic to discuss case, possible add on. Stacy voiced Butch Penny able to see patient at 1:30pm today.   I called patient back to confirm instruction and give directions to clinic. He's agreeable to this and voiced thanks for call -- will show for A Fib clinic appt.

## 2016-04-01 NOTE — Telephone Encounter (Signed)
-----   Message from Juluis Mire, RN sent at 04/01/2016  1:59 PM EST ----- Regarding: sleep study Pt needs sleep study for afib. Pt will be expecting call. Thanks! [facility]

## 2016-04-01 NOTE — Telephone Encounter (Signed)
New message     Pt needs you to call he would like to speak to you about the cardio version done in the emergency room yesterday

## 2016-04-01 NOTE — Progress Notes (Signed)
Primary Care Physician: Irven Shelling, MD Referring Physician: [facility] ER f/u Cardiologist: Dr. Carlyn Reichert [name] is a 61 y.o. male with a h/o new onset afib dx by PCP and started on cardizem and eliquis. He was then seen by Dr. Johnsie Cancel and set up for cardioversion 3/14. Echo showed LV dysfunction 25-30%. He presented to the ER yesterday because he was not tolerating the afib with v rate of 160 bpm and he was cardioverted in the ER.  He called the [facility] office to report that he felt short of breath this am, improved from in afib but not back to baseline, and thought he was back in afib. EKG shows SR. His weight is down, he is not fluid overloaded and by recent labs, he may be dry. He slept well last night. He is not using excess caffeine, no alcohol, no tobacco but usually sleeps poorly, wakes up multiple times, has been told that he snores and has daytime somnolence. Dr, Johnsie Cancel wanted to test for ischemia when he returned to [place].  Today, he denies symptoms of palpitations, chest pain, orthopnea, PND, lower extremity edema, dizziness, presyncope, syncope, or neurologic sequela. Positive for shortness of breath. The patient is tolerating medications without difficulties and is otherwise without complaint today.   Past Medical History:  Diagnosis Date  . Asthma, persistent   . BPH (benign prostatic hyperplasia) 2014  . Combined hyperlipidemia   . GERD (gastroesophageal reflux disease)   . Hypertension   . 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 Obesity   . Renal stone 04/2012  . Seasonal allergic rhinitis    No past surgical history on file.  Current Outpatient Prescriptions  Medication Sig Dispense Refill  . albuterol (PROVENTIL HFA;VENTOLIN HFA) 108 (90 Base) MCG/ACT inhaler Inhale 2 puffs into the lungs every 6 (six) hours as needed for wheezing or shortness of breath.    Marland Kitchen apixaban (ELIQUIS) 5 MG TABS tablet Take 5 mg by mouth 2 (two) times daily.    . carvedilol  (COREG) 6.25 MG tablet Take 1 tablet (6.25 mg total) by mouth 2 (two) times daily. 180 tablet 3  . fluticasone furoate-vilanterol (BREO ELLIPTA) 100-25 MCG/INH AEPB Inhale 1 puff into the lungs daily.    Derrill Memo ON 04/02/2016] furosemide (LASIX) 20 MG tablet Take 1 tablet (20 mg total) by mouth every Monday, Wednesday, and Friday. 90 tablet 3  . irbesartan (AVAPRO) 300 MG tablet Take 300 mg by mouth daily.    . pantoprazole (PROTONIX) 40 MG tablet Take 40 mg by mouth daily.     No current facility-administered medications for this encounter.     Allergies  Allergen Reactions  . Lisinopril Cough    Social History   Social History  . Marital status: Married    Spouse name: N/A  . Number of children: N/A  . Years of education: N/A   Occupational History  . 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 Smoking status: Never Smoker  . Smokeless tobacco: Never Used  . Alcohol use No  . Drug use: No  . Sexual activity: Not on file   Other Topics Concern  . Not on file   Social History Narrative  . No narrative on file    No family history on file.  ROS- All systems are reviewed and negative except as per the HPI above  Physical Exam: Vitals:   04/01/16 1321  BP: 130/90  Pulse: 79  Weight: 240  lb 12.8 oz (109.2 kg)  Height: 6' (1.829 m)   Wt Readings from Last 3 Encounters:  04/01/16 240 lb 12.8 oz (109.2 kg)  03/31/16 244 lb (110.7 kg)  03/26/16 244 lb 6.4 oz (110.9 kg)    Labs: Lab Results  Component Value Date   NA 143 03/31/2016   K 4.0 03/31/2016   CL 109 03/31/2016   CO2 25 03/31/2016   GLUCOSE 89 03/31/2016   BUN 25 (H) 03/31/2016   CREATININE 1.68 (H) 03/31/2016   CALCIUM 9.0 03/31/2016   MG 2.1 03/31/2016   Lab Results  Component Value Date   INR 1.2 03/30/2016   No results found for: CHOL, HDL, LDLCALC, TRIG   GEN- The patient is well appearing, alert and oriented x 3 today.   Head- normocephalic, atraumatic Eyes-  Sclera clear,  conjunctiva pink Ears- hearing intact Oropharynx- clear Neck- supple, no JVP Lymph- no cervical lymphadenopathy Lungs- Clear to ausculation bilaterally, normal work of breathing Heart- Slightly irregular(pc's) rate and rhythm, no murmurs, rubs or gallops, PMI not laterally displaced GI- soft, NT, ND, + BS Extremities- no clubbing, cyanosis, or edema MS- no significant deformity or atrophy Skin- no rash or lesion Psych- euthymic mood, full affect Neuro- strength and sensation are intact  EKG- SR with PAC's pr int 140 bpm, qrs int 100 ms, qtc 525 ms(prolonged) Echo-LV EF: 25% -   30%  ------------------------------------------------------------------- Indications:      Atrial fibrillation (I48).  ------------------------------------------------------------------- History:   Risk factors:  Hypertension. Obese. Dyslipidemia.  ------------------------------------------------------------------- Study Conclusions  - Left ventricle: The cavity size was severely dilated. Wall   thickness was normal. Systolic function was severely reduced. The   estimated ejection fraction was in the range of 25% to 30%.   Diffuse hypokinesis. The study is not technically sufficient to   allow evaluation of LV diastolic function. - Aorta: Aortic root dimension: 40 mm (ED). Ascending aortic   diameter: 39 mm (S). - Ascending aorta: The ascending aorta was mildly dilated. - Mitral valve: Mildly thickened leaflets . There was mild   regurgitation. - Left atrium: Severely dilated. - Right ventricle: Moderately dilated. Wall thickness was normal.   Systolic function is moderately reduced. - Right atrium: Severely dilated. - Tricuspid valve: There was mild regurgitation. - Pulmonary arteries: PA peak pressure: 31 mm Hg (S). - Inferior vena cava: The vessel was dilated. The respirophasic   diameter changes were blunted (< 50%), consistent with elevated   central venous pressure.  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   Assessment and Plan: 1. New onset symptomatic afib Successful cardioversion yesterday although he still feels short of breath Weight is down 4 lbs since on lasix No evidence of excess fluid today Decrease lasix to 20 mg 3x a week with creat/bun yesterday at 1.68/25 Watch for increased fluid weight with reduction of lasix Continue eliquis without missed doses Continue carvedilol Sleep study  Will message Dr. Johnsie Cancel to let him know cardioversion was cancelled for 3/14 and see if he wants to go ahead and schedule stress test before he sees him back 4/17.  Geroge Baseman [name], [facility] [address] [place], [place] [address] [phone]

## 2016-04-01 NOTE — Patient Instructions (Signed)
Your physician has recommended you make the following change in your medication:  1)Decrease lasix to 20mg  on Monday, Wednesday, Friday only. Weigh yourself every morning. If more than 3lb weight gain overnight please call.

## 2016-04-02 NOTE — Telephone Encounter (Signed)
This encounter was created in error - please disregard.

## 2016-04-04 ENCOUNTER — Emergency Department (HOSPITAL_COMMUNITY)
Admission: EM | Admit: 2016-04-04 | Discharge: 2016-04-04 | Disposition: A | Discharge status: Home / Self Care | Payer: PRIVATE HEALTH INSURANCE | Attending: Emergency Medicine | Admitting: Emergency Medicine

## 2016-04-04 ENCOUNTER — Encounter (HOSPITAL_COMMUNITY): Payer: Self-pay

## 2016-04-04 ENCOUNTER — Emergency Department (HOSPITAL_COMMUNITY): Payer: PRIVATE HEALTH INSURANCE

## 2016-04-04 DIAGNOSIS — J45909 Unspecified asthma, uncomplicated: Secondary | ICD-10-CM | POA: Insufficient documentation

## 2016-04-04 DIAGNOSIS — Z7901 Long term (current) use of anticoagulants: Secondary | ICD-10-CM | POA: Diagnosis not present

## 2016-04-04 DIAGNOSIS — I4891 Unspecified atrial fibrillation: Secondary | ICD-10-CM | POA: Diagnosis not present

## 2016-04-04 DIAGNOSIS — I10 Essential (primary) hypertension: Secondary | ICD-10-CM | POA: Insufficient documentation

## 2016-04-04 DIAGNOSIS — R0602 Shortness of breath: Secondary | ICD-10-CM | POA: Diagnosis present

## 2016-04-04 LAB — CBC WITH DIFFERENTIAL/PLATELET
BASOS PCT: 0 %
Basophils Absolute: 0 10*3/uL (ref 0.0–0.1)
Eosinophils Absolute: 0.1 10*3/uL (ref 0.0–0.7)
Eosinophils Relative: 2 %
HEMATOCRIT: 42 % (ref 39.0–52.0)
Hemoglobin: 13.9 g/dL (ref 13.0–17.0)
LYMPHS ABS: 1.2 10*3/uL (ref 0.7–4.0)
LYMPHS PCT: 20 %
MCH: 29.6 pg (ref 26.0–34.0)
MCHC: 33.1 g/dL (ref 30.0–36.0)
MCV: 89.4 fL (ref 78.0–100.0)
MONO ABS: 0.8 10*3/uL (ref 0.1–1.0)
MONOS PCT: 13 %
NEUTROS ABS: 3.9 10*3/uL (ref 1.7–7.7)
Neutrophils Relative %: 65 %
Platelets: 220 10*3/uL (ref 150–400)
RBC: 4.7 MIL/uL (ref 4.22–5.81)
RDW: 13.5 % (ref 11.5–15.5)
WBC: 6 10*3/uL (ref 4.0–10.5)

## 2016-04-04 LAB — BASIC METABOLIC PANEL
Anion gap: 7 (ref 5–15)
BUN: 19 mg/dL (ref 6–20)
CHLORIDE: 111 mmol/L (ref 101–111)
CO2: 23 mmol/L (ref 22–32)
Calcium: 8.9 mg/dL (ref 8.9–10.3)
Creatinine, Ser: 1.43 mg/dL — ABNORMAL HIGH (ref 0.61–1.24)
GFR calc Af Amer: >60 mL/min (ref >60)
GFR calc non Af Amer: 52 mL/min — ABNORMAL LOW (ref >60)
GLUCOSE: 85 mg/dL (ref 65–99)
POTASSIUM: 3.9 mmol/L (ref 3.5–5.1)
Sodium: 141 mmol/L (ref 135–145)

## 2016-04-04 LAB — I-STAT TROPONIN, ED: Troponin i, poc: 0.05 ng/mL (ref 0.00–0.08)

## 2016-04-04 MED ORDER — SODIUM CHLORIDE 0.9 % IV SOLN
INTRAVENOUS | Status: DC
  Administered 2016-04-04: 15:00:00 via INTRAVENOUS

## 2016-04-04 MED ORDER — ETOMIDATE 2 MG/ML IV SOLN
16.0000 mg | Freq: Once | INTRAVENOUS | Status: DC
  Filled 2016-04-04: qty 10

## 2016-04-04 MED ORDER — ONDANSETRON HCL 4 MG/2ML IJ SOLN
4.0000 mg | Freq: Once | INTRAMUSCULAR | Status: AC
  Administered 2016-04-04: 4 mg via INTRAVENOUS
  Filled 2016-04-04: qty 2

## 2016-04-04 MED ORDER — ETOMIDATE 2 MG/ML IV SOLN
INTRAVENOUS | Status: AC | PRN
  Administered 2016-04-04: 6 mg via INTRAVENOUS
  Administered 2016-04-04: 10 mg via INTRAVENOUS

## 2016-04-04 NOTE — Progress Notes (Signed)
Yes schedule exercise myovue thanks

## 2016-04-04 NOTE — ED Provider Notes (Signed)
[facility] DEPT Provider Note   CSN: [id number] Arrival date & time: 04/04/16  1062     History   Chief Complaint No chief complaint on file.   HPI [name] is a 61 y.o. male.  Patient with fairly new diagnosis atrial fibrillation. Patient is on Coreg. Patient will present emergency department rapid atrial fib on March 7. Was cardioverted. Went to normal sinus rhythm. Last evening patient started at the same feeling of shortness of breath is not able to tell palpitations no chest pain. Came in today an EKG showed rapid atrial flutter with heart rate 150-160.       Past Medical History:  Diagnosis Date  . Asthma, persistent   . BPH (benign prostatic hyperplasia) 2014  . Combined hyperlipidemia   . GERD (gastroesophageal reflux disease)   . Hypertension   . 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 Obesity   . Renal stone 04/2012  . Seasonal allergic rhinitis     There are no active problems to display for this patient.   History reviewed. No pertinent surgical history.     Home Medications    Prior to Admission medications   Medication Sig Start Date End Date Taking? Authorizing Provider  albuterol (PROVENTIL HFA;VENTOLIN HFA) 108 (90 Base) MCG/ACT inhaler Inhale 2 puffs into the lungs every 6 (six) hours as needed for wheezing or shortness of breath.   Yes Historical Provider, MD  apixaban (ELIQUIS) 5 MG TABS tablet Take 5 mg by mouth 2 (two) times daily.   Yes Historical Provider, MD  carvedilol (COREG) 6.25 MG tablet Take 1 tablet (6.25 mg total) by mouth 2 (two) times daily. 03/26/16  Yes Josue Hector, MD  fluticasone furoate-vilanterol (BREO ELLIPTA) 100-25 MCG/INH AEPB Inhale 1 puff into the lungs daily.   Yes Historical Provider, MD  furosemide (LASIX) 20 MG tablet Take 1 tablet (20 mg total) by mouth every Monday, Wednesday, and Friday. 04/02/16 07/01/16 Yes Sherran Needs, NP  irbesartan (AVAPRO) 300 MG tablet Take 300 mg by mouth daily.   Yes  Historical Provider, MD  pantoprazole (PROTONIX) 40 MG tablet Take 40 mg by mouth daily.   Yes Historical Provider, MD  pseudoephedrine-acetaminophen (TYLENOL SINUS) 30-500 MG TABS tablet Take 2 tablets by mouth every 4 (four) hours as needed (congestion).   Yes Historical Provider, MD    Family History No family history on file.  Social History Social History  Substance Use Topics  . Smoking status: Never Smoker  . Smokeless tobacco: Never Used  . Alcohol use No     Allergies   Lisinopril   Review of Systems Review of Systems  Constitutional: Negative for diaphoresis and fever.  HENT: Negative for congestion.   Eyes: Negative for visual disturbance.  Respiratory: Positive for shortness of breath.   Cardiovascular: Negative for chest pain.  Gastrointestinal: Negative for abdominal pain, nausea and vomiting.  Genitourinary: Negative for hematuria.  Musculoskeletal: Negative for back pain.  Neurological: Positive for weakness. Negative for syncope and headaches.  Hematological: Bruises/bleeds easily.  Psychiatric/Behavioral: Negative for confusion.     Physical Exam Updated Vital Signs BP (!) 129/101   Pulse (!) 50   Temp 97.5 F (36.4 C) (Oral)   Resp 13   Ht 6' (1.829 m)   Wt 108.9 kg   SpO2 98%   BMI 32.55 kg/m   Physical Exam  Constitutional: He is oriented to person, place, and time. He appears well-developed and well-nourished. No distress.  HENT:  Head: Normocephalic and atraumatic.  Mouth/Throat: Oropharynx is clear and moist.  Eyes: Conjunctivae and EOM are normal. Pupils are equal, round, and reactive to light.  Neck: Normal range of motion. Neck supple.  Cardiovascular:  Tachycardic and irregular  Pulmonary/Chest: Effort normal and breath sounds normal. No respiratory distress.  Abdominal: Soft. Bowel sounds are normal. There is no tenderness.  Musculoskeletal: Normal range of motion. He exhibits no edema.  Neurological: He is alert and oriented  to person, place, and time. No cranial nerve deficit or sensory deficit. He exhibits normal muscle tone. Coordination normal.  Skin: Skin is warm. No rash noted.  Nursing note and vitals reviewed.    ED Treatments / Results  Labs (all labs ordered are listed, but only abnormal results are displayed) Labs Reviewed  BASIC METABOLIC PANEL - Abnormal; Notable for the following:       Result Value   Creatinine, Ser 1.43 (*)    GFR calc non Af Amer 52 (*)    All other components within normal limits  CBC WITH DIFFERENTIAL/PLATELET  Randolm Idol, ED    EKG  EKG Interpretation  Date/Time:  Sunday April 04 2016 14:35:08 EDT Ventricular Rate:  82 PR Interval:    QRS Duration: 104 QT Interval:  367 QTC Calculation: 429 R Axis:   96 Text Interpretation:  Sinus rhythm Multiform ventricular premature complexes Consider left atrial enlargement Right axis deviation Borderline T wave abnormalities Post cardioversion Back to sinus rhythm Confirmed by Rogene Houston  MD, Nicki Reaper ([id number]) on 04/04/2016 3:09:46 PM Also confirmed by Rogene Houston  MD, Arvella Massingale [id number]), editor [place] CT, Leda Gauze [phone])  on 04/04/2016 3:26:25 PM       Radiology Dg Chest 2 View  Result Date: 04/04/2016 CLINICAL DATA:  Shortness of breath since last night. EXAM: CHEST  2 VIEW COMPARISON:  03/31/2016 FINDINGS: There is a trace left pleural effusion. There is no focal consolidation. There is mild bilateral interstitial thickening. There is no pneumothorax. There is mild stable cardiomegaly. The osseous structures are unremarkable. IMPRESSION: 1. Cardiomegaly with mild interstitial edema. Electronically Signed   By: Kathreen Devoid   On: 04/04/2016 10:48    Procedures .Cardioversion Date/Time: 04/04/2016 3:43 PM Performed by: Fredia Sorrow Authorized by: Fredia Sorrow   Consent:    Consent obtained:  Written   Consent given by:  Patient   Risks discussed:  Death, induced arrhythmia, pain and cutaneous burn   Alternatives  discussed:  Rate-control medication Pre-procedure details:    Cardioversion basis:  Emergent   Rhythm:  Atrial fibrillation   Electrode placement:  Anterior-posterior Attempt one:    Cardioversion mode:  Synchronous   Waveform:  Biphasic   Shock (Joules):  120   Shock outcome:  Conversion to normal sinus rhythm Post-procedure details:    Patient status:  Awake   Patient tolerance of procedure:  Tolerated well, no immediate complications   (including critical care time)  CRITICAL CARE Performed by: Hurley Sobel Total critical care time: 30 minutes Critical care time was exclusive of separately billable procedures and treating other patients. Critical care was necessary to treat or prevent imminent or life-threatening deterioration. Critical care was time spent personally by me on the following activities: development of treatment plan with patient and/or surrogate as well as nursing, discussions with consultants, evaluation of patient's response to treatment, examination of patient, obtaining history from patient or surrogate, ordering and performing treatments and interventions, ordering and review of laboratory studies, ordering and review of radiographic studies, pulse oximetry and re-evaluation of  patient's condition.  Procedural sedation Performed by: Fredia Sorrow Consent: Verbal consent obtained. Risks and benefits: risks, benefits and alternatives were discussed Required items: required blood products, implants, devices, and special equipment available Patient identity confirmed: arm band and provided demographic data Time out: Immediately prior to procedure a "time out" was called to verify the correct patient, procedure, equipment, support staff and site/side marked as required.  Sedation type: moderate (conscious) sedation NPO time confirmed and considedered  Sedatives: ETOMIDATE  Physician Time at Bedside: 30  Vitals: Vital signs were monitored during sedation.  Cardiac Monitor, pulse oximeter Patient tolerance: Patient tolerated the procedure well with no immediate complications. Comments: Pt with uneventful recovered. Returned to pre-procedural sedation baseline     Medications Ordered in ED Medications  0.9 %  sodium chloride infusion ( Intravenous New Bag/Given 04/04/16 1430)  etomidate (AMIDATE) injection 16 mg (not administered)  ondansetron (ZOFRAN) injection 4 mg (4 mg Intravenous Given 04/04/16 1430)  etomidate (AMIDATE) injection (6 mg Intravenous Given 04/04/16 1434)     Initial Impression / Assessment and Plan / ED Course  I have reviewed the triage vital signs and the nursing notes.  Pertinent labs & imaging results that were available during my care of the patient were reviewed by me and considered in my medical decision making (see chart for details).     Patient status post cardioversion on March 7 which was Wednesday. Patient last night started to have recurrent symptoms of feeling short of breath which usually means heart rates going fast. He is not able to experience palpitations.  Patient's labs here without sniffing abnormality chest x-ray negative. Patient's blood pressure fine. Patient's anticoagulated with Eliquis.  Patient followed by cardiology. Patient is on Coreg.  Discussed cardioversion with patient again. This time cardioverted using etomidate for sedation and cardioverted at 120 J. Went well patient returns normal sinus rhythm. Outpatient follow-up with cardiology tomorrow. He will return for any newer worse symptoms.  Final Clinical Impressions(s) / ED Diagnoses   Final diagnoses:  Atrial fibrillation with rapid ventricular response [facility])    New Prescriptions New Prescriptions   No medications on file     Fredia Sorrow, MD 04/04/16 1550

## 2016-04-04 NOTE — ED Notes (Signed)
Pt cardioverted with 120 J synced.  Rhythm now SR, EKG done.

## 2016-04-04 NOTE — ED Triage Notes (Signed)
Patient complains of shortness of breath since last night, had cardioversion on Wednesday for A. Fib, denies CP. Appears congested and coughing on arrival, NAD

## 2016-04-04 NOTE — Discharge Instructions (Signed)
Make appointment to follow-up with cardiology early this week. If her heart rate starts going fast again and last for 40 minutes return. Return for any new or worse symptoms.

## 2016-04-04 NOTE — ED Notes (Signed)
Pt awake now.

## 2016-04-05 ENCOUNTER — Other Ambulatory Visit: Payer: PRIVATE HEALTH INSURANCE

## 2016-04-05 ENCOUNTER — Ambulatory Visit (HOSPITAL_COMMUNITY)
Admission: RE | Admit: 2016-04-05 | Discharge: 2016-04-05 | Disposition: A | Discharge status: Home / Self Care | Payer: PRIVATE HEALTH INSURANCE | Source: Ambulatory Visit | Attending: Nurse Practitioner | Admitting: Nurse Practitioner

## 2016-04-05 ENCOUNTER — Telehealth: Payer: Self-pay | Admitting: Cardiovascular Disease

## 2016-04-05 ENCOUNTER — Encounter (HOSPITAL_COMMUNITY): Payer: Self-pay | Admitting: Nurse Practitioner

## 2016-04-05 VITALS — BP 160/110 | HR 83 | Ht 72.0 in | Wt 241.8 lb

## 2016-04-05 DIAGNOSIS — K219 Gastro-esophageal reflux disease without esophagitis: Secondary | ICD-10-CM | POA: Insufficient documentation

## 2016-04-05 DIAGNOSIS — I481 Persistent atrial fibrillation: Secondary | ICD-10-CM

## 2016-04-05 DIAGNOSIS — Z7901 Long term (current) use of anticoagulants: Secondary | ICD-10-CM | POA: Diagnosis not present

## 2016-04-05 DIAGNOSIS — Z79899 Other long term (current) drug therapy: Secondary | ICD-10-CM | POA: Diagnosis not present

## 2016-04-05 DIAGNOSIS — I4819 Other persistent atrial fibrillation: Secondary | ICD-10-CM

## 2016-04-05 DIAGNOSIS — Z87442 Personal history of urinary calculi: Secondary | ICD-10-CM | POA: Insufficient documentation

## 2016-04-05 DIAGNOSIS — I10 Essential (primary) hypertension: Secondary | ICD-10-CM | POA: Diagnosis not present

## 2016-04-05 DIAGNOSIS — E669 Obesity, unspecified: Secondary | ICD-10-CM | POA: Diagnosis not present

## 2016-04-05 DIAGNOSIS — I4891 Unspecified atrial fibrillation: Secondary | ICD-10-CM | POA: Insufficient documentation

## 2016-04-05 DIAGNOSIS — N400 Enlarged prostate without lower urinary tract symptoms: Secondary | ICD-10-CM | POA: Insufficient documentation

## 2016-04-05 DIAGNOSIS — J45909 Unspecified asthma, uncomplicated: Secondary | ICD-10-CM | POA: Insufficient documentation

## 2016-04-05 DIAGNOSIS — E782 Mixed hyperlipidemia: Secondary | ICD-10-CM | POA: Insufficient documentation

## 2016-04-05 DIAGNOSIS — Z888 Allergy status to other drugs, medicaments and biological substances status: Secondary | ICD-10-CM | POA: Insufficient documentation

## 2016-04-05 MED ORDER — CARVEDILOL 12.5 MG PO TABS: 12.5000 mg | ORAL_TABLET | Freq: Two times a day (BID) | ORAL | 3 refills | Status: DC

## 2016-04-05 NOTE — Patient Instructions (Signed)
Your physician has recommended you make the following change in your medication:  1)Increase coreg to 12.5mg  twice a day 2)May use coreg 6.25mg  -- as needed in between doses if you have breakthrough afib.  Avoid decongestants.

## 2016-04-05 NOTE — Telephone Encounter (Signed)
New message   Pt has a question about what procedures he will possibly have done in the future and requests a call back.

## 2016-04-05 NOTE — Progress Notes (Signed)
Primary Care Physician: Irven Shelling, MD Referring Physician: [facility] ER f/u Cardiologist: Dr. Carlyn Reichert [name] is a 61 y.o. male with a h/o new onset afib dx by PCP and started on cardizem and eliquis. He was then seen by Dr. Johnsie Cancel and set up for cardioversion 3/14. Echo showed LV dysfunction 25-30%. He presented to the ER yesterday because he was not tolerating the afib with v rate of 160 bpm and he was cardioverted in the ER.  He called the [facility] street office to report that he felt short of breath this am, improved from in afib but not back to baseline, and thought he was back in afib. EKG shows SR. His weight is down, he is not fluid overloaded and by recent labs, he may be dry. He slept well last night. He is not using excess caffeine, no alcohol, no tobacco but usually sleeps poorly, wakes up multiple times, has been told that he snores and has daytime somnolence. Dr, Johnsie Cancel wanted to test for ischemia when he returned to [place].  Return to afib clinic f/u ER visit yesterday for return of afib with rvr. Successfully cardioverted. He reports that he took a sinus preparation over the weekend that had pseudoephedrine in it, which may have been a trigger. He is in SR today. BP is up and will increase carvedilol to help with BP and discourage afib.   Today, he denies symptoms of palpitations, chest pain, orthopnea, PND, lower extremity edema, dizziness, presyncope, syncope, or neurologic sequela. Positive for shortness of breath. The patient is tolerating medications without difficulties and is otherwise without complaint today.   Past Medical History:  Diagnosis Date  . Asthma, persistent   . BPH (benign prostatic hyperplasia) 2014  . Combined hyperlipidemia   . GERD (gastroesophageal reflux disease)   . Hypertension   . 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 Obesity   . Renal stone 04/2012  . Seasonal allergic rhinitis    No past surgical history on file.  Current  Outpatient Prescriptions  Medication Sig Dispense Refill  . albuterol (PROVENTIL HFA;VENTOLIN HFA) 108 (90 Base) MCG/ACT inhaler Inhale 2 puffs into the lungs every 6 (six) hours as needed for wheezing or shortness of breath.    Marland Kitchen apixaban (ELIQUIS) 5 MG TABS tablet Take 5 mg by mouth 2 (two) times daily.    . carvedilol (COREG) 12.5 MG tablet Take 1 tablet (12.5 mg total) by mouth 2 (two) times daily. 60 tablet 3  . fluticasone furoate-vilanterol (BREO ELLIPTA) 100-25 MCG/INH AEPB Inhale 1 puff into the lungs daily.    . furosemide (LASIX) 20 MG tablet Take 1 tablet (20 mg total) by mouth every Monday, Wednesday, and Friday. 90 tablet 3  . irbesartan (AVAPRO) 300 MG tablet Take 300 mg by mouth daily.    . pantoprazole (PROTONIX) 40 MG tablet Take 40 mg by mouth daily.    . pseudoephedrine-acetaminophen (TYLENOL SINUS) 30-500 MG TABS tablet Take 2 tablets by mouth every 4 (four) hours as needed (congestion).     No current facility-administered medications for this encounter.     Allergies  Allergen Reactions  . Lisinopril Cough    Social History   Social History  . Marital status: Married    Spouse name: N/A  . Number of children: N/A  . Years of education: N/A   Occupational History  . 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 Smoking status: Never Smoker  . Smokeless tobacco:  Never Used  . Alcohol use No  . Drug use: No  . Sexual activity: Not on file   Other Topics Concern  . Not on file   Social History Narrative  . No narrative on file    No family history on file.  ROS- All systems are reviewed and negative except as per the HPI above  Physical Exam: Vitals:   04/05/16 1106  BP: (!) 160/110  Pulse: 83  Weight: 241 lb 12.8 oz (109.7 kg)  Height: 6' (1.829 m)   Wt Readings from Last 3 Encounters:  04/05/16 241 lb 12.8 oz (109.7 kg)  04/04/16 240 lb (108.9 kg)  04/01/16 240 lb 12.8 oz (109.2 kg)    Labs: Lab Results  Component Value  Date   NA 141 04/04/2016   K 3.9 04/04/2016   CL 111 04/04/2016   CO2 23 04/04/2016   GLUCOSE 85 04/04/2016   BUN 19 04/04/2016   CREATININE 1.43 (H) 04/04/2016   CALCIUM 8.9 04/04/2016   MG 2.1 03/31/2016   Lab Results  Component Value Date   INR 1.2 03/30/2016   No results found for: CHOL, HDL, LDLCALC, TRIG   GEN- The patient is well appearing, alert and oriented x 3 today.   Head- normocephalic, atraumatic Eyes-  Sclera clear, conjunctiva pink Ears- hearing intact Oropharynx- clear Neck- supple, no JVP Lymph- no cervical lymphadenopathy Lungs- Clear to ausculation bilaterally, normal work of breathing Heart- Regular rate and rhythm, no murmurs, rubs or gallops, PMI not laterally displaced GI- soft, NT, ND, + BS Extremities- no clubbing, cyanosis, or edema MS- no significant deformity or atrophy Skin- no rash or lesion Psych- euthymic mood, full affect Neuro- strength and sensation are intact  EKG- SR, pr int 123 ms, qrs int 98 ms, qtc 535 ms, IRBBB. Twave abnormality, consider anterior ischemia.  Echo- LV EF: 25% -   30%  ------------------------------------------------------------------- Indications:      Atrial fibrillation (I48).  ------------------------------------------------------------------- History:   Risk factors:  Hypertension. Obese. Dyslipidemia.  ------------------------------------------------------------------- Study Conclusions  - Left ventricle: The cavity size was severely dilated. Wall   thickness was normal. Systolic function was severely reduced. The   estimated ejection fraction was in the range of 25% to 30%.   Diffuse hypokinesis. The study is not technically sufficient to   allow evaluation of LV diastolic function. - Aorta: Aortic root dimension: 40 mm (ED). Ascending aortic   diameter: 39 mm (S). - Ascending aorta: The ascending aorta was mildly dilated. - Mitral valve: Mildly thickened leaflets . There was mild    regurgitation. - Left atrium: Severely dilated.54 mm - Right ventricle: Moderately dilated. Wall thickness was normal.   Systolic function is moderately reduced. - Right atrium: Severely dilated. - Tricuspid valve: There was mild regurgitation. - Pulmonary arteries: PA peak pressure: 31 mm Hg (S). - Inferior vena cava: The vessel was dilated. The respirophasic   diameter changes were blunted (< 50%), consistent with elevated   central venous pressure.  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   Assessment and Plan: 1. New onset symptomatic afib Successful cardioversion 3/7 with unfortunate return to afib 3/11, with another successful cardioversion in the ER. Possible trigger was that pt took sinus medicine over the weekend with psuedophredrine Weight is stable and  labs improved with lasix 3x a week BP is elevated and will increase carvedilol to 12.5 mg bid, as well as to encourage SR and/or hopefully Afib will not be as fast if he returns to afib. Can take an extra 6.25 mg as needed for breakthrough afib Discussed antiarrythmic's with pt. He is not a candidate for multaq or flecainide due to EF of 20%. His EKG was reviewed with Dr. Rayann Heman and he has a prolonged qtc and is not a candidate for sotalol or tikosyn. With left atrium size of 54 mm, it may be difficult to keep in SR. Not an ideal  ablation candidate with enlarged left atrium. That leaves amiodarone( can tolerate qtc of up to 600 ms) and I would hesitate to start yet, because of young age, unless it remains difficult to keep in [place]. I am hoping the decongestant was the trigger and he was asked to avoid stimulants going forward. He needs a stress test with reduced EF to assess for ischemia and EKG suggests anterior ischemia. Dr, Kyla Balzarine  office is to schedule. Continue eliquis without missed doses Sleep study pending  Geroge Baseman. Garl Speigner, [facility] [address] [place], [place] [address] [phone]

## 2016-04-05 NOTE — Telephone Encounter (Signed)
Pt states he had cardioversion last week in ER. Pt states he became short of breath yesterday, went to ED yesterday, had cardioversion in ED yesterday.

## 2016-04-05 NOTE — Telephone Encounter (Signed)
Pt states ED told him yesterday he needed sooner follow up for his atrial fib since he had been cardioverted 2 times in the last week.  Pt states he is some short of breath today, feels like SR is not going to hold, requesting appt today.  Pt advised he has been scheduled to see Roderic Palau, NP in [facility]  today at 11:30AM. Pt is scheduled to see Dr Johnsie Cancel 05/11/16 and is requesting sooner appt with Dr Johnsie Cancel. Pt advised I have rescheduled 05/11/16 appt with Dr Johnsie Cancel to 04/16/16 8:15AM.

## 2016-04-07 ENCOUNTER — Ambulatory Visit (HOSPITAL_COMMUNITY): Admit: 2016-04-07 | Payer: PRIVATE HEALTH INSURANCE | Admitting: Cardiovascular Disease

## 2016-04-07 ENCOUNTER — Telehealth: Payer: Self-pay

## 2016-04-07 ENCOUNTER — Encounter (HOSPITAL_COMMUNITY): Payer: Self-pay

## 2016-04-07 DIAGNOSIS — R0602 Shortness of breath: Secondary | ICD-10-CM

## 2016-04-07 SURGERY — CARDIOVERSION
Anesthesia: Monitor Anesthesia Care

## 2016-04-07 NOTE — Progress Notes (Signed)
See note from 04/02/14 yes can schedule lexiscan myovue

## 2016-04-07 NOTE — Telephone Encounter (Signed)
-----   Message from Josue Hector, MD sent at 04/07/2016  4:36 PM EDT ----- Yes lexiscan myovue

## 2016-04-07 NOTE — Telephone Encounter (Signed)
Called patient about Dr. Johnsie Cancel wanting him to have a Lexiscan myoview. Informed patient of instructions for stress test and someone will be calling to schedule patient for test.

## 2016-04-09 ENCOUNTER — Ambulatory Visit (HOSPITAL_COMMUNITY)
Admission: RE | Admit: 2016-04-09 | Discharge: 2016-04-09 | Disposition: A | Discharge status: Home / Self Care | Payer: PRIVATE HEALTH INSURANCE | Source: Ambulatory Visit | Attending: Nurse Practitioner | Admitting: Nurse Practitioner

## 2016-04-09 ENCOUNTER — Telehealth: Payer: Self-pay | Admitting: Student

## 2016-04-09 ENCOUNTER — Telehealth (HOSPITAL_COMMUNITY): Payer: Self-pay | Admitting: *Deleted

## 2016-04-09 ENCOUNTER — Encounter (HOSPITAL_COMMUNITY): Payer: Self-pay | Admitting: Nurse Practitioner

## 2016-04-09 ENCOUNTER — Other Ambulatory Visit (HOSPITAL_COMMUNITY): Payer: Self-pay | Admitting: *Deleted

## 2016-04-09 VITALS — BP 144/98 | HR 143 | Ht 72.0 in

## 2016-04-09 DIAGNOSIS — I4891 Unspecified atrial fibrillation: Secondary | ICD-10-CM | POA: Insufficient documentation

## 2016-04-09 DIAGNOSIS — E782 Mixed hyperlipidemia: Secondary | ICD-10-CM | POA: Insufficient documentation

## 2016-04-09 DIAGNOSIS — Z7901 Long term (current) use of anticoagulants: Secondary | ICD-10-CM | POA: Diagnosis not present

## 2016-04-09 DIAGNOSIS — J45909 Unspecified asthma, uncomplicated: Secondary | ICD-10-CM | POA: Diagnosis not present

## 2016-04-09 DIAGNOSIS — N400 Enlarged prostate without lower urinary tract symptoms: Secondary | ICD-10-CM | POA: Insufficient documentation

## 2016-04-09 DIAGNOSIS — I481 Persistent atrial fibrillation: Secondary | ICD-10-CM

## 2016-04-09 DIAGNOSIS — I452 Bifascicular block: Secondary | ICD-10-CM | POA: Insufficient documentation

## 2016-04-09 DIAGNOSIS — Z888 Allergy status to other drugs, medicaments and biological substances status: Secondary | ICD-10-CM | POA: Diagnosis not present

## 2016-04-09 DIAGNOSIS — Z79899 Other long term (current) drug therapy: Secondary | ICD-10-CM | POA: Insufficient documentation

## 2016-04-09 DIAGNOSIS — Z87442 Personal history of urinary calculi: Secondary | ICD-10-CM | POA: Diagnosis not present

## 2016-04-09 DIAGNOSIS — K219 Gastro-esophageal reflux disease without esophagitis: Secondary | ICD-10-CM | POA: Diagnosis not present

## 2016-04-09 DIAGNOSIS — E669 Obesity, unspecified: Secondary | ICD-10-CM | POA: Diagnosis not present

## 2016-04-09 DIAGNOSIS — I10 Essential (primary) hypertension: Secondary | ICD-10-CM | POA: Diagnosis not present

## 2016-04-09 DIAGNOSIS — I4819 Other persistent atrial fibrillation: Secondary | ICD-10-CM

## 2016-04-09 MED ORDER — AMIODARONE HCL 200 MG PO TABS: 400.0000 mg | ORAL_TABLET | Freq: Two times a day (BID) | ORAL | 1 refills | Status: DC

## 2016-04-09 MED ORDER — APIXABAN 5 MG PO TABS: 5.0000 mg | ORAL_TABLET | Freq: Two times a day (BID) | ORAL | 3 refills | Status: DC

## 2016-04-09 NOTE — Patient Instructions (Signed)
Your physician has recommended you make the following change in your medication:  1)Amiodarone 400mg  twice a day (2 tablets of the 200mg  tablets twice a day)  2)keep coreg at current dose of 12.5mg  twice a day unless you go into normal rhythm and you heart rate is consistently staying less than 50 -- in this case you would go down to coreg 6.25mg  twice a day

## 2016-04-09 NOTE — Telephone Encounter (Signed)
Patient called the answering service reporting his HR has been elevated overnight and he was unable to sleep (varying in the 80's - 130's). Breathing at baseline.   He took an additional 6.25mg  of Coreg at 2300 then another 6.25mg  at 0300. BP most recently in 130's/low-100's. Instructed him to take his morning dose of 12.5mg .  Sent staff message to Roderic Palau, NP with the atrial fibrillation clinic in regards to further medication adjustments as he was just seen there this past Monday. Patient will call the clinic if he has not heard back by noon today.  Signed, Erma Heritage, PA-C 04/09/2016, 6:56 AM

## 2016-04-09 NOTE — Telephone Encounter (Signed)
Called patient per note from [place] PA from earlier this morning. Pt states he has been short of breath over night and this morning HR has ranged from 80-120s. Pt sounds very anxious over the phone. Discussed with Roderic Palau NP will bring in for EKG to confirm rhythm and possibly look at starting Amiodarone. Pt agreeable to this. Will come at 11am today.

## 2016-04-09 NOTE — Progress Notes (Signed)
Primary Care Physician: Irven Shelling, MD Referring Physician: [facility] ER f/u Cardiologist: Dr. Carlyn Reichert DEMPSY DAMIANO is a 61 y.o. male with a h/o new onset afib dx by PCP and started on cardizem and eliquis. He was then seen by Dr. Johnsie Cancel and set up for cardioversion 3/14. Echo showed LV dysfunction 25-30%. He presented to the ER yesterday because he was not tolerating the afib with v rate of 160 bpm and he was cardioverted in the ER.  He called the [facility] street office to report that he felt short of breath this am, improved from in afib but not back to baseline, and thought he was back in afib. EKG shows SR. His weight is down, he is not fluid overloaded and by recent labs, he may be dry. He slept well last night. He is not using excess caffeine, no alcohol, no tobacco but usually sleeps poorly, wakes up multiple times, has been told that he snores and has daytime somnolence. Dr, Johnsie Cancel wanted to test for ischemia when he returned to [place].  Return to afib clinic f/u ER visit yesterday for return of afib with rvr. Successfully cardioverted. He reports that he took a sinus preparation over the weekend that had pseudoephedrine in it, which may have been a trigger. He is in SR today. BP is up and will increase carvedilol to help with BP and discourage afib.   Pt returns to afib clinic this am for return of afib with rvr last night. He again has trouble sleeping and feels short of breath when is afib. He is not a good candidate for any antiarrhythmic other than amiodarone due to long qtc. H/o asthma. Has a left atrium of 54 mm which is undermining ability to stay in SR. EF is also reduced at 20-25%. He continues to stay on xarelto.  Today, he denies symptoms of palpitations, chest pain, orthopnea, PND, lower extremity edema, dizziness, presyncope, syncope, or neurologic sequela. Positive for shortness of breath. The patient is tolerating medications without difficulties and is otherwise without  complaint today.   Past Medical History:  Diagnosis Date  . Asthma, persistent   . BPH (benign prostatic hyperplasia) 2014  . Combined hyperlipidemia   . GERD (gastroesophageal reflux disease)   . Hypertension   . 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 Obesity   . Renal stone 04/2012  . Seasonal allergic rhinitis    No past surgical history on file.  Current Outpatient Prescriptions  Medication Sig Dispense Refill  . albuterol (PROVENTIL HFA;VENTOLIN HFA) 108 (90 Base) MCG/ACT inhaler Inhale 2 puffs into the lungs every 6 (six) hours as needed for wheezing or shortness of breath.    Marland Kitchen apixaban (ELIQUIS) 5 MG TABS tablet Take 5 mg by mouth 2 (two) times daily.    . carvedilol (COREG) 12.5 MG tablet Take 1 tablet (12.5 mg total) by mouth 2 (two) times daily. 60 tablet 3  . fluticasone furoate-vilanterol (BREO ELLIPTA) 100-25 MCG/INH AEPB Inhale 1 puff into the lungs daily.    . furosemide (LASIX) 20 MG tablet Take 1 tablet (20 mg total) by mouth every Monday, Wednesday, and Friday. 90 tablet 3  . irbesartan (AVAPRO) 300 MG tablet Take 300 mg by mouth daily.    . pantoprazole (PROTONIX) 40 MG tablet Take 40 mg by mouth daily.    . pseudoephedrine-acetaminophen (TYLENOL SINUS) 30-500 MG TABS tablet Take 2 tablets by mouth every 4 (four) hours as needed (congestion).    Marland Kitchen amiodarone (PACERONE)  200 MG tablet Take 2 tablets (400 mg total) by mouth 2 (two) times daily. 90 tablet 1   No current facility-administered medications for this encounter.     Allergies  Allergen Reactions  . Lisinopril Cough    Social History   Social History  . Marital status: Married    Spouse name: N/A  . Number of children: N/A  . Years of education: N/A   Occupational History  . 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 Smoking status: Never Smoker  . Smokeless tobacco: Never Used  . Alcohol use No  . Drug use: No  . Sexual activity: Not on file   Other Topics Concern  .  Not on file   Social History Narrative  . No narrative on file    No family history on file.  ROS- All systems are reviewed and negative except as per the HPI above  Physical Exam: Vitals:   04/09/16 1115  BP: (!) 144/98  Pulse: (!) 143  Height: 6' (1.829 m)   Wt Readings from Last 3 Encounters:  04/05/16 241 lb 12.8 oz (109.7 kg)  04/04/16 240 lb (108.9 kg)  04/01/16 240 lb 12.8 oz (109.2 kg)    Labs: Lab Results  Component Value Date   NA 141 04/04/2016   K 3.9 04/04/2016   CL 111 04/04/2016   CO2 23 04/04/2016   GLUCOSE 85 04/04/2016   BUN 19 04/04/2016   CREATININE 1.43 (H) 04/04/2016   CALCIUM 8.9 04/04/2016   MG 2.1 03/31/2016   Lab Results  Component Value Date   INR 1.2 03/30/2016   No results found for: CHOL, HDL, LDLCALC, TRIG   GEN- The patient is well appearing, alert and oriented x 3 today.   Head- normocephalic, atraumatic Eyes-  Sclera clear, conjunctiva pink Ears- hearing intact Oropharynx- clear Neck- supple, no JVP Lymph- no cervical lymphadenopathy Lungs- Clear to ausculation bilaterally, normal work of breathing Heart- Regular rate and rhythm, no murmurs, rubs or gallops, PMI not laterally displaced GI- soft, NT, ND, + BS Extremities- no clubbing, cyanosis, or edema MS- no significant deformity or atrophy Skin- no rash or lesion Psych- euthymic mood, full affect Neuro- strength and sensation are intact  EKG- afib at 143 bpm, qrs int 96 ms, qtc 496 ms, IRBBB, LAFB  Echo- LV EF: 25% -   30%  ------------------------------------------------------------------- Indications:      Atrial fibrillation (I48).  ------------------------------------------------------------------- History:   Risk factors:  Hypertension. Obese. Dyslipidemia.  ------------------------------------------------------------------- Study Conclusions  - Left ventricle: The cavity size was severely dilated. Wall   thickness was normal. Systolic function was  severely reduced. The   estimated ejection fraction was in the range of 25% to 30%.   Diffuse hypokinesis. The study is not technically sufficient to   allow evaluation of LV diastolic function. - Aorta: Aortic root dimension: 40 mm (ED). Ascending aortic   diameter: 39 mm (S). - Ascending aorta: The ascending aorta was mildly dilated. - Mitral valve: Mildly thickened leaflets . There was mild   regurgitation. - Left atrium: Severely dilated.54 mm - Right ventricle: Moderately dilated. Wall thickness was normal.   Systolic function is moderately reduced. - Right atrium: Severely dilated. - Tricuspid valve: There was mild regurgitation. - Pulmonary arteries: PA peak pressure: 31 mm Hg (S). - Inferior vena cava: The vessel was dilated. The respirophasic   diameter changes were blunted (< 50%), consistent with elevated   central venous pressure.  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   Assessment and Plan: 1. New onset symptomatic afib Successful cardioversion 3/7 with unfortunate return to afib 3/11, with another successful cardioversion in the ER. Now back in symptomatic afib Weight is stable  Continue carvedilol at 12.5 mg bid Discussed antiarrythmic's with pt. He is not a candidate for multaq or flecainide due to EF of 20%. His EKG shows a  prolonged qtc(in SR over 500 ms) and is not a candidate for sotalol or tikosyn. With left atrium size of 54 mm, it may be difficult to keep in SR. Not an ideal  ablation candidate with enlarged left atrium. That leaves amiodarone( can tolerate qtc of up to 600 ms) and discussed with Dr. Rayann Heman and we feel that this is his only option right now. Hopefully, if he can stay in SR, he may remodel left atrium and be an candidate for ablation. He will start  amiodarone 400 mg bid and recheck here on Monday. Depending if he has control of v rate, will decide if stress test has be delayed. Dr. Rayann Heman feels that if he does not have control of v rate by Monday or if he becomes more symptomatic, he will be placed in the hospital for possible IV amiodarone at which time a cath can be done.  Dr, Kyla Balzarine office has scheduled a stress test for Tuesday. Will decide on Monday if this will need to be cancelled or resheduled. Continue eliquis without missed doses Sleep study pending  I told pt that if his condition over the weekend  gets worse go to ER with probable admission.  Pt discussed with Dr. Rayann Heman who assisted in the plan of care.    Geroge Baseman Taylore Hinde, [facility] [address] [place], [place] [address] [phone]

## 2016-04-12 ENCOUNTER — Encounter (HOSPITAL_COMMUNITY): Payer: Self-pay | Admitting: Nurse Practitioner

## 2016-04-12 ENCOUNTER — Other Ambulatory Visit (HOSPITAL_COMMUNITY): Payer: Self-pay | Admitting: *Deleted

## 2016-04-12 ENCOUNTER — Ambulatory Visit (HOSPITAL_COMMUNITY)
Admission: RE | Admit: 2016-04-12 | Discharge: 2016-04-12 | Disposition: A | Discharge status: Home / Self Care | Payer: No Typology Code available for payment source | Source: Ambulatory Visit | Attending: Nurse Practitioner | Admitting: Nurse Practitioner

## 2016-04-12 VITALS — BP 132/96 | HR 118 | Ht 72.0 in | Wt 237.0 lb

## 2016-04-12 DIAGNOSIS — K219 Gastro-esophageal reflux disease without esophagitis: Secondary | ICD-10-CM | POA: Insufficient documentation

## 2016-04-12 DIAGNOSIS — Z87442 Personal history of urinary calculi: Secondary | ICD-10-CM | POA: Diagnosis not present

## 2016-04-12 DIAGNOSIS — Z79899 Other long term (current) drug therapy: Secondary | ICD-10-CM | POA: Diagnosis not present

## 2016-04-12 DIAGNOSIS — E782 Mixed hyperlipidemia: Secondary | ICD-10-CM | POA: Diagnosis not present

## 2016-04-12 DIAGNOSIS — I10 Essential (primary) hypertension: Secondary | ICD-10-CM | POA: Diagnosis not present

## 2016-04-12 DIAGNOSIS — I4819 Other persistent atrial fibrillation: Secondary | ICD-10-CM

## 2016-04-12 DIAGNOSIS — J45909 Unspecified asthma, uncomplicated: Secondary | ICD-10-CM | POA: Insufficient documentation

## 2016-04-12 DIAGNOSIS — I481 Persistent atrial fibrillation: Secondary | ICD-10-CM | POA: Diagnosis not present

## 2016-04-12 DIAGNOSIS — I4891 Unspecified atrial fibrillation: Secondary | ICD-10-CM | POA: Insufficient documentation

## 2016-04-12 DIAGNOSIS — N400 Enlarged prostate without lower urinary tract symptoms: Secondary | ICD-10-CM | POA: Diagnosis not present

## 2016-04-12 DIAGNOSIS — E669 Obesity, unspecified: Secondary | ICD-10-CM | POA: Insufficient documentation

## 2016-04-12 NOTE — Progress Notes (Signed)
Primary Care Physician: Irven Shelling, MD Referring Physician: [facility] ER f/u Cardiologist: Dr. Carlyn Reichert [name] is a 61 y.o. male with a h/o new onset afib dx by PCP and started on cardizem and eliquis. He was then seen by Dr. Johnsie Cancel and set up for cardioversion 3/14. Echo showed LV dysfunction 25-30%. He presented to the ER yesterday because he was not tolerating the afib with v rate of 160 bpm and he was cardioverted in the ER.  He called the [facility] street office to report that he felt short of breath this am, improved from in afib but not back to baseline, and thought he was back in afib. EKG shows SR. His weight is down, he is not fluid overloaded and by recent labs, he may be dry. He slept well last night. He is not using excess caffeine, no alcohol, no tobacco but usually sleeps poorly, wakes up multiple times, has been told that he snores and has daytime somnolence. Dr, Johnsie Cancel wanted to test for ischemia when he returned to [place].  Return to afib clinic f/u ER visit yesterday for return of afib with rvr. Successfully cardioverted. He reports that he took a sinus preparation over the weekend that had pseudoephedrine in it, which may have been a trigger. He is in SR today. BP is up and will increase carvedilol to help with BP and discourage afib.   Pt returns to afib clinic this am for return of afib with rvr last night. He again has trouble sleeping and feels short of breath when is afib. He is not a good candidate for any antiarrhythmic other than amiodarone due to long qtc. H/o asthma. Has a left atrium of 54 mm which is undermining ability to stay in SR. EF is also reduced at 20-25%. He continues to stay on xarelto.  F/u in afib clinic after start of amiodarone load Friday pm. He feels better with less shortness of breath despite being in afib . His heart rate is improved but still around 118 bpm. I suspect this to improve with more loading of amiodarone. He had a stress test  scheduled for tomorrow but will have to cancel until his v rate is better controlled.   Today, he denies symptoms of palpitations, chest pain, orthopnea, PND, lower extremity edema, dizziness, presyncope, syncope, or neurologic sequela. Positive for shortness of breath, improved. The patient is tolerating medications without difficulties and is otherwise without complaint today.   Past Medical History:  Diagnosis Date  . Asthma, persistent   . BPH (benign prostatic hyperplasia) 2014  . Combined hyperlipidemia   . GERD (gastroesophageal reflux disease)   . Hypertension   . 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 Obesity   . Renal stone 04/2012  . Seasonal allergic rhinitis    No past surgical history on file.  Current Outpatient Prescriptions  Medication Sig Dispense Refill  . albuterol (PROVENTIL HFA;VENTOLIN HFA) 108 (90 Base) MCG/ACT inhaler Inhale 2 puffs into the lungs every 6 (six) hours as needed for wheezing or shortness of breath.    Marland Kitchen amiodarone (PACERONE) 200 MG tablet Take 2 tablets (400 mg total) by mouth 2 (two) times daily. 90 tablet 1  . apixaban (ELIQUIS) 5 MG TABS tablet Take 1 tablet (5 mg total) by mouth 2 (two) times daily. 60 tablet 3  . carvedilol (COREG) 12.5 MG tablet Take 1 tablet (12.5 mg total) by mouth 2 (two) times daily. 60 tablet 3  . fluticasone furoate-vilanterol (BREO  ELLIPTA) 100-25 MCG/INH AEPB Inhale 1 puff into the lungs daily.    . furosemide (LASIX) 20 MG tablet Take 1 tablet (20 mg total) by mouth every Monday, Wednesday, and Friday. 90 tablet 3  . irbesartan (AVAPRO) 300 MG tablet Take 300 mg by mouth daily.    . pantoprazole (PROTONIX) 40 MG tablet Take 40 mg by mouth daily.     No current facility-administered medications for this encounter.     Allergies  Allergen Reactions  . Lisinopril Cough    Social History   Social History  . Marital status: Married    Spouse name: N/A  . Number of children: N/A  . Years of education:  N/A   Occupational History  . 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 Smoking status: Never Smoker  . Smokeless tobacco: Never Used  . Alcohol use No  . Drug use: No  . Sexual activity: Not on file   Other Topics Concern  . Not on file   Social History Narrative  . No narrative on file    No family history on file.  ROS- All systems are reviewed and negative except as per the HPI above  Physical Exam: Vitals:   04/12/16 1054  BP: (!) 132/96  Pulse: (!) 118  SpO2: 95%  Weight: 237 lb (107.5 kg)  Height: 6' (1.829 m)   Wt Readings from Last 3 Encounters:  04/12/16 237 lb (107.5 kg)  04/05/16 241 lb 12.8 oz (109.7 kg)  04/04/16 240 lb (108.9 kg)    Labs: Lab Results  Component Value Date   NA 141 04/04/2016   K 3.9 04/04/2016   CL 111 04/04/2016   CO2 23 04/04/2016   GLUCOSE 85 04/04/2016   BUN 19 04/04/2016   CREATININE 1.43 (H) 04/04/2016   CALCIUM 8.9 04/04/2016   MG 2.1 03/31/2016   Lab Results  Component Value Date   INR 1.2 03/30/2016   No results found for: CHOL, HDL, LDLCALC, TRIG   GEN- The patient is well appearing, alert and oriented x 3 today.   Head- normocephalic, atraumatic Eyes-  Sclera clear, conjunctiva pink Ears- hearing intact Oropharynx- clear Neck- supple, no JVP Lymph- no cervical lymphadenopathy Lungs- Clear to ausculation bilaterally, normal work of breathing Heart- Regular rate and rhythm, no murmurs, rubs or gallops, PMI not laterally displaced GI- soft, NT, ND, + BS Extremities- no clubbing, cyanosis, or edema MS- no significant deformity or atrophy Skin- no rash or lesion Psych- euthymic mood, full affect Neuro- strength and sensation are intact  EKG- afib at 118 bpm, qrs int 104 ms, qtc 398 ms, IRBBB,  Non specific t wave abnormality  Echo- LV EF: 25% -   30%  ------------------------------------------------------------------- Indications:      Atrial fibrillation  (I48).  ------------------------------------------------------------------- History:   Risk factors:  Hypertension. Obese. Dyslipidemia.  ------------------------------------------------------------------- Study Conclusions  - Left ventricle: The cavity size was severely dilated. Wall   thickness was normal. Systolic function was severely reduced. The   estimated ejection fraction was in the range of 25% to 30%.   Diffuse hypokinesis. The study is not technically sufficient to   allow evaluation of LV diastolic function. - Aorta: Aortic root dimension: 40 mm (ED). Ascending aortic   diameter: 39 mm (S). - Ascending aorta: The ascending aorta was mildly dilated. - Mitral valve: Mildly thickened leaflets . There was mild   regurgitation. - Left atrium: Severely dilated.54 mm - Right ventricle: Moderately dilated. Wall thickness  was normal.   Systolic function is moderately reduced. - Right atrium: Severely dilated. - Tricuspid valve: There was mild regurgitation. - Pulmonary arteries: PA peak pressure: 31 mm Hg (S). - Inferior vena cava: The vessel was dilated. The respirophasic   diameter changes were blunted (< 50%), consistent with elevated   central venous pressure.  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   Assessment and Plan: 1. New onset symptomatic afib Successful cardioversion 3/7 with unfortunate return to afib 3/11, with another successful cardioversion in the ER,. with ERAF symptomatic . Weight is stable  Continue carvedilol at 12.5 mg bid He is not a candidate for multaq or flecainide due to EF of 20%. His EKG shows a  prolonged qtc(in SR over 500 ms) and is not a candidate for sotalol or tikosyn. With left atrium size of 54 mm, it may be difficult to keep in SR. Not  an ideal  ablation candidate with enlarged left atrium. That left amiodarone when discussed with Dr. Rayann Heman on Friday which was started at 400 mg bid x one week, we  felt that this is his only option right now. Hopefully, if he can stay in SR, he may remodel left atrium and be an candidate for ablation.  He is feeling better today but still too fast to have a stress test tomorrow, test cancelled.  It was discussed on Friday, because pt was so symptomatic, depending on status today, it might be necessary to admit for a cath today. He would have to be bridged since his last cardioversion was just a week ago, as not to interrupt anticoagulation. However, he does appear improved today so will defer this to Dr. Johnsie Cancel for determination of work up for possible ischemia when he sees on Friday. Continue eliquis without missed doses Sleep study pending Decrease amiodarone to 200 mg bid on Saturday the 24th  I will see back back in two weeks for timing of cardioversion after loading drug for a month.   Geroge Baseman Liliann File, [facility] [address] [place], [name] [address] [phone]

## 2016-04-12 NOTE — Progress Notes (Deleted)
Cardiology Office Note   Date:  04/12/2016   ID:  [name], DOB 07/03/1955, MRN [id number]  PCP:  Irven Shelling, MD  Cardiologist:   Jenkins Rouge, MD   No chief complaint on file.     History of Present Illness: [name] is a 61 y.o. male first seen in March 2017 for evaluation of afib.   Seen by Dr Laurann Montana 03/09/16 started on cardizem And eliquis  History of HTN and asthma .  LDL is 119.  Cr 1.25 K 4.0 PLT 214 Hct 41.6 TSH 1.14 03/03/16   Echo 03/17/16 EF 25-30%  Diffuse hypokinesis Root 40 mm Mild MR  LA/RA  severely dilated  RV moderately dilated    Dyspnea much worse last 2 months no chest pain palpitations or syncope does have PND and orthopnea with LE edema  Has been taking eliquis Since 03/03/16  No bleeding issues. No family history of DCM. Denies ETOH.    Long discussion with him about afib. Strategies of rate control, anticoagulation and rhythm control This patients CHA2DS2-VASc Score and unadjusted Ischemic Stroke Rate (% per year) is equal to 2.2 % stroke rate/year from a score of 2  Above score calculated as 1 point each if present [CHF, HTN, DM, Vascular=MI/PAD/Aortic Plaque, Age if 65-74, or Male] Above score calculated as 2 points each if present [Age > 75, or Stroke/TIA/TE]  Subsequently was anticoagulated Hospitalized and cardioverted in ER 04/04/16  Reverted back to afib And started on Amiodarone by Doristine Devoid. 04/09/16.  Less dyspnea despite being in afib still  His QT Is prolonged and not a candidate for tikosyn or sotolol.    Sleep study pending and still needs ischemic evaluation.  HR too high and myovue cancelled last week   Past Medical History:  Diagnosis Date  . Asthma, persistent   . BPH (benign prostatic hyperplasia) 2014  . Combined hyperlipidemia   . GERD (gastroesophageal reflux disease)   . Hypertension   . 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 Obesity   . Renal stone 04/2012  . Seasonal allergic rhinitis      No past surgical history on file.   Current Outpatient Prescriptions  Medication Sig Dispense Refill  . albuterol (PROVENTIL HFA;VENTOLIN HFA) 108 (90 Base) MCG/ACT inhaler Inhale 2 puffs into the lungs every 6 (six) hours as needed for wheezing or shortness of breath.    Marland Kitchen amiodarone (PACERONE) 200 MG tablet Take 2 tablets (400 mg total) by mouth 2 (two) times daily. 90 tablet 1  . apixaban (ELIQUIS) 5 MG TABS tablet Take 1 tablet (5 mg total) by mouth 2 (two) times daily. 60 tablet 3  . carvedilol (COREG) 12.5 MG tablet Take 1 tablet (12.5 mg total) by mouth 2 (two) times daily. 60 tablet 3  . fluticasone furoate-vilanterol (BREO ELLIPTA) 100-25 MCG/INH AEPB Inhale 1 puff into the lungs daily.    . furosemide (LASIX) 20 MG tablet Take 1 tablet (20 mg total) by mouth every Monday, Wednesday, and Friday. 90 tablet 3  . irbesartan (AVAPRO) 300 MG tablet Take 300 mg by mouth daily.    . pantoprazole (PROTONIX) 40 MG tablet Take 40 mg by mouth daily.     No current facility-administered medications for this visit.     Allergies:   Lisinopril    Social History:  The patient  reports that he has never smoked. He has never used smokeless tobacco. He reports that he does not drink alcohol or use  drugs.   Family History:  The patient's family history is not on file.    ROS:  Please see the history of present illness.   Otherwise, review of systems are positive for none.   All other systems are reviewed and negative.    PHYSICAL EXAM: VS:  There were no vitals taken for this visit. , BMI There is no height or weight on file to calculate BMI. Affect appropriate Healthy:  appears stated age 34: normal Neck supple with no adenopathy JVP normal no bruits no thyromegaly Lungs clear with no wheezing and good diaphragmatic motion Heart:  S1/S2 no murmur, no rub, gallop or click PMI normal Abdomen: benighn, BS positve, no tenderness, no AAA no bruit.  No HSM or HJR Distal pulses  intact with no bruits Plus one bilateral edema Neuro non-focal Skin warm and dry No muscular weakness    EKG:  Afib rate 97 ICRBBB no MI    Recent Labs: 03/30/2016: NT-Pro BNP 3,730 03/31/2016: Magnesium 2.1 04/04/2016: BUN 19; Creatinine, Ser 1.43; Hemoglobin 13.9; Platelets 220; Potassium 3.9; Sodium 141    Lipid Panel No results found for: CHOL, TRIG, HDL, CHOLHDL, VLDL, LDLCALC, LDLDIRECT    Wt Readings from Last 3 Encounters:  04/12/16 237 lb (107.5 kg)  04/05/16 241 lb 12.8 oz (109.7 kg)  04/04/16 240 lb (108.9 kg)      Other studies Reviewed: Additional studies/ records that were reviewed today include: Notes [facility] Laurann Montana labs .    ASSESSMENT AND PLAN:  1.  Afib: continue eliquis, coreg and amiodarone will arrange f/u [facility] in about 4 weeks   2. Decreased EF rate control better will arrange for myovue next week try to avoid cath And stopping anticoagulation.   3. Dyspnea previously related to asthma now afib and DCM see med changes above   4. Anticoagulation  Continue eliquis on since 03/03/16 has used samples from Dr Laurann Montana  5. HTN:  Well controlled.  Continue current medications and low sodium Dash type diet.    6. Lipids  On questran labs with Dr Jacqulyn Cane

## 2016-04-13 ENCOUNTER — Encounter (HOSPITAL_COMMUNITY): Payer: PRIVATE HEALTH INSURANCE

## 2016-04-15 ENCOUNTER — Emergency Department (HOSPITAL_COMMUNITY): Payer: PRIVATE HEALTH INSURANCE

## 2016-04-15 ENCOUNTER — Encounter (HOSPITAL_COMMUNITY): Payer: Self-pay

## 2016-04-15 DIAGNOSIS — I130 Hypertensive heart and chronic kidney disease with heart failure and stage 1 through stage 4 chronic kidney disease, or unspecified chronic kidney disease: Principal | ICD-10-CM | POA: Diagnosis present

## 2016-04-15 DIAGNOSIS — J45909 Unspecified asthma, uncomplicated: Secondary | ICD-10-CM | POA: Diagnosis present

## 2016-04-15 DIAGNOSIS — N183 Chronic kidney disease, stage 3 (moderate): Secondary | ICD-10-CM | POA: Diagnosis present

## 2016-04-15 DIAGNOSIS — R001 Bradycardia, unspecified: Secondary | ICD-10-CM | POA: Diagnosis not present

## 2016-04-15 DIAGNOSIS — I428 Other cardiomyopathies: Secondary | ICD-10-CM | POA: Diagnosis present

## 2016-04-15 DIAGNOSIS — Z6832 Body mass index (BMI) 32.0-32.9, adult: Secondary | ICD-10-CM

## 2016-04-15 DIAGNOSIS — G4730 Sleep apnea, unspecified: Secondary | ICD-10-CM | POA: Diagnosis present

## 2016-04-15 DIAGNOSIS — I480 Paroxysmal atrial fibrillation: Secondary | ICD-10-CM | POA: Diagnosis present

## 2016-04-15 DIAGNOSIS — E876 Hypokalemia: Secondary | ICD-10-CM | POA: Diagnosis present

## 2016-04-15 DIAGNOSIS — N400 Enlarged prostate without lower urinary tract symptoms: Secondary | ICD-10-CM | POA: Diagnosis present

## 2016-04-15 DIAGNOSIS — K219 Gastro-esophageal reflux disease without esophagitis: Secondary | ICD-10-CM | POA: Diagnosis present

## 2016-04-15 DIAGNOSIS — Z9109 Other allergy status, other than to drugs and biological substances: Secondary | ICD-10-CM

## 2016-04-15 DIAGNOSIS — E669 Obesity, unspecified: Secondary | ICD-10-CM | POA: Diagnosis present

## 2016-04-15 DIAGNOSIS — Z7901 Long term (current) use of anticoagulants: Secondary | ICD-10-CM

## 2016-04-15 DIAGNOSIS — I5043 Acute on chronic combined systolic (congestive) and diastolic (congestive) heart failure: Secondary | ICD-10-CM | POA: Diagnosis present

## 2016-04-15 DIAGNOSIS — Z79899 Other long term (current) drug therapy: Secondary | ICD-10-CM

## 2016-04-15 DIAGNOSIS — Z7951 Long term (current) use of inhaled steroids: Secondary | ICD-10-CM

## 2016-04-15 DIAGNOSIS — I240 Acute coronary thrombosis not resulting in myocardial infarction: Secondary | ICD-10-CM | POA: Diagnosis present

## 2016-04-15 DIAGNOSIS — E782 Mixed hyperlipidemia: Secondary | ICD-10-CM | POA: Diagnosis present

## 2016-04-15 DIAGNOSIS — N179 Acute kidney failure, unspecified: Secondary | ICD-10-CM | POA: Diagnosis present

## 2016-04-15 LAB — CBC
HCT: 40.6 % (ref 39.0–52.0)
Hemoglobin: 13.5 g/dL (ref 13.0–17.0)
MCH: 29.5 pg (ref 26.0–34.0)
MCHC: 33.3 g/dL (ref 30.0–36.0)
MCV: 88.8 fL (ref 78.0–100.0)
PLATELETS: 196 10*3/uL (ref 150–400)
RBC: 4.57 MIL/uL (ref 4.22–5.81)
RDW: 13.9 % (ref 11.5–15.5)
WBC: 6.5 10*3/uL (ref 4.0–10.5)

## 2016-04-15 LAB — BASIC METABOLIC PANEL
ANION GAP: 10 (ref 5–15)
BUN: 31 mg/dL — ABNORMAL HIGH (ref 6–20)
CALCIUM: 8.8 mg/dL — AB (ref 8.9–10.3)
CO2: 22 mmol/L (ref 22–32)
CREATININE: 2.1 mg/dL — AB (ref 0.61–1.24)
Chloride: 109 mmol/L (ref 101–111)
GFR, EST AFRICAN AMERICAN: 38 mL/min — AB (ref >60)
GFR, EST NON AFRICAN AMERICAN: 33 mL/min — AB (ref >60)
Glucose, Bld: 108 mg/dL — ABNORMAL HIGH (ref 65–99)
Potassium: 3.6 mmol/L (ref 3.5–5.1)
Sodium: 141 mmol/L (ref 135–145)

## 2016-04-15 LAB — I-STAT TROPONIN, ED: TROPONIN I, POC: 0.04 ng/mL (ref 0.00–0.08)

## 2016-04-15 NOTE — ED Triage Notes (Signed)
Pt reports he started feeling bad tonight around 5 pm and describes symptoms such as nausea, dizziness, lightheadedness, weakness and heart palpitations. HR-107 afib at triage. Hx of afib

## 2016-04-15 NOTE — ED Notes (Signed)
Pt denies CP

## 2016-04-16 ENCOUNTER — Telehealth: Payer: Self-pay | Admitting: Cardiovascular Disease

## 2016-04-16 ENCOUNTER — Ambulatory Visit: Payer: PRIVATE HEALTH INSURANCE | Admitting: Cardiovascular Disease

## 2016-04-16 ENCOUNTER — Other Ambulatory Visit (HOSPITAL_COMMUNITY): Payer: PRIVATE HEALTH INSURANCE

## 2016-04-16 ENCOUNTER — Inpatient Hospital Stay (HOSPITAL_COMMUNITY)
Admission: EM | Admit: 2016-04-16 | Discharge: 2016-04-27 | DRG: 291 | Disposition: A | Discharge status: Home / Self Care | Payer: PRIVATE HEALTH INSURANCE | Attending: Cardiovascular Disease | Admitting: Cardiovascular Disease

## 2016-04-16 DIAGNOSIS — I5021 Acute systolic (congestive) heart failure: Secondary | ICD-10-CM | POA: Diagnosis present

## 2016-04-16 DIAGNOSIS — I5041 Acute combined systolic (congestive) and diastolic (congestive) heart failure: Secondary | ICD-10-CM | POA: Diagnosis present

## 2016-04-16 DIAGNOSIS — I4891 Unspecified atrial fibrillation: Secondary | ICD-10-CM

## 2016-04-16 DIAGNOSIS — I5023 Acute on chronic systolic (congestive) heart failure: Secondary | ICD-10-CM

## 2016-04-16 LAB — BRAIN NATRIURETIC PEPTIDE: B Natriuretic Peptide: 1201.3 pg/mL — ABNORMAL HIGH (ref 0.0–100.0)

## 2016-04-16 LAB — HIV ANTIBODY (ROUTINE TESTING W REFLEX): HIV Screen 4th Generation wRfx: NONREACTIVE

## 2016-04-16 LAB — TSH: TSH: 1.522 u[IU]/mL (ref 0.350–4.500)

## 2016-04-16 LAB — MRSA PCR SCREENING: MRSA BY PCR: NEGATIVE

## 2016-04-16 MED ORDER — ONDANSETRON HCL 4 MG/2ML IJ SOLN: 4.0000 mg | Freq: Four times a day (QID) | INTRAMUSCULAR | Status: DC | PRN

## 2016-04-16 MED ORDER — PANTOPRAZOLE SODIUM 40 MG PO TBEC
40.0000 mg | DELAYED_RELEASE_TABLET | Freq: Every day | ORAL | Status: DC
  Administered 2016-04-16 – 2016-04-27 (×12): 40 mg via ORAL
  Filled 2016-04-16 (×12): qty 1

## 2016-04-16 MED ORDER — FUROSEMIDE 10 MG/ML IJ SOLN
80.0000 mg | Freq: Two times a day (BID) | INTRAMUSCULAR | Status: DC
  Administered 2016-04-16 – 2016-04-19 (×8): 80 mg via INTRAVENOUS
  Filled 2016-04-16 (×8): qty 8

## 2016-04-16 MED ORDER — SODIUM CHLORIDE 0.9 % IV SOLN
250.0000 mL | INTRAVENOUS | Status: DC | PRN
  Administered 2016-04-23: 14:00:00 via INTRAVENOUS

## 2016-04-16 MED ORDER — SODIUM CHLORIDE 0.9% FLUSH: 3.0000 mL | INTRAVENOUS | Status: DC | PRN

## 2016-04-16 MED ORDER — APIXABAN 5 MG PO TABS
5.0000 mg | ORAL_TABLET | Freq: Two times a day (BID) | ORAL | Status: DC
  Administered 2016-04-16 – 2016-04-17 (×3): 5 mg via ORAL
  Filled 2016-04-16 (×4): qty 1

## 2016-04-16 MED ORDER — CARVEDILOL 12.5 MG PO TABS
12.5000 mg | ORAL_TABLET | Freq: Two times a day (BID) | ORAL | Status: DC
  Administered 2016-04-16 – 2016-04-19 (×8): 12.5 mg via ORAL
  Filled 2016-04-16 (×8): qty 1

## 2016-04-16 MED ORDER — ACETAMINOPHEN 325 MG PO TABS: 650.0000 mg | ORAL_TABLET | ORAL | Status: DC | PRN

## 2016-04-16 MED ORDER — AMIODARONE HCL 200 MG PO TABS
400.0000 mg | ORAL_TABLET | Freq: Two times a day (BID) | ORAL | Status: DC
  Administered 2016-04-16 – 2016-04-19 (×7): 400 mg via ORAL
  Filled 2016-04-16 (×7): qty 2

## 2016-04-16 MED ORDER — SODIUM CHLORIDE 0.9 % IV BOLUS (SEPSIS)
500.0000 mL | Freq: Once | INTRAVENOUS | Status: AC
  Administered 2016-04-16: 500 mL via INTRAVENOUS

## 2016-04-16 MED ORDER — LIVING BETTER WITH HEART FAILURE BOOK
Freq: Once | Status: AC
  Administered 2016-04-16: 17:00:00

## 2016-04-16 MED ORDER — SODIUM CHLORIDE 0.9% FLUSH
3.0000 mL | Freq: Two times a day (BID) | INTRAVENOUS | Status: DC
  Administered 2016-04-16 – 2016-04-20 (×8): 3 mL via INTRAVENOUS

## 2016-04-16 NOTE — ED Provider Notes (Signed)
[facility] DEPT Provider Note   CSN: [id number] Arrival date & time: 04/15/16  2252  By signing my name below, I, Oleh Genin, attest that this documentation has been prepared under the direction and in the presence of Sherwood Gambler, MD. Electronically Signed: Oleh Genin, Scribe. 04/16/16. 2:15 AM.   History   Chief Complaint Chief Complaint  Patient presents with  . Atrial Fibrillation    HPI [name] is a 61 y.o. male with recent history of new onset A-fib on carvedilol, amiodarone, and Eliquis who presents to the ED for evaluation of lightheadedness. This patient states that approximately 7-8 hours ago he was at home eating when he gradually became lightheaded and globally weak with racing heart rate. At interview, he states that he is experiencing only mild lightheadedness and otherwise in good health. No chest pain or dyspnea today. He has developed bilateral pedal edema in the recent weeks which have worsened in the last 2 days. When asked, does report some orthopnea; but does not feel these symptoms are necessarily worsening. No vomiting or diarrhea. Of note, he was recently seen by cardiology who decreased his amiodarone to 200mg  bid in an appointment 4 days ago.  The history is provided by the patient. No language interpreter was used.    Past Medical History:  Diagnosis Date  . Asthma, persistent   . BPH (benign prostatic hyperplasia) 2014  . Combined hyperlipidemia   . GERD (gastroesophageal reflux disease)   . Hypertension   . 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 Obesity   . Renal stone 04/2012  . Seasonal allergic rhinitis     Patient Active Problem List   Diagnosis Date Noted  . Acute systolic heart failure ([place]) 04/16/2016    History reviewed. No pertinent surgical history.     Home Medications    Prior to Admission medications   Medication Sig Start Date End Date Taking? Authorizing Provider  albuterol (PROVENTIL  HFA;VENTOLIN HFA) 108 (90 Base) MCG/ACT inhaler Inhale 2 puffs into the lungs every 6 (six) hours as needed for wheezing or shortness of breath.   Yes Historical Provider, MD  amiodarone (PACERONE) 200 MG tablet Take 2 tablets (400 mg total) by mouth 2 (two) times daily. 04/09/16  Yes Sherran Needs, NP  apixaban (ELIQUIS) 5 MG TABS tablet Take 1 tablet (5 mg total) by mouth 2 (two) times daily. 04/09/16  Yes Sherran Needs, NP  carvedilol (COREG) 12.5 MG tablet Take 1 tablet (12.5 mg total) by mouth 2 (two) times daily. 04/05/16  Yes Sherran Needs, NP  fluticasone furoate-vilanterol (BREO ELLIPTA) 100-25 MCG/INH AEPB Inhale 1 puff into the lungs daily.   Yes Historical Provider, MD  furosemide (LASIX) 20 MG tablet Take 1 tablet (20 mg total) by mouth every Monday, Wednesday, and Friday. 04/02/16 07/01/16 Yes Sherran Needs, NP  irbesartan (AVAPRO) 300 MG tablet Take 300 mg by mouth daily.   Yes Historical Provider, MD  pantoprazole (PROTONIX) 40 MG tablet Take 40 mg by mouth daily.   Yes Historical Provider, MD    Family History No family history on file.  Social History Social History  Substance Use Topics  . Smoking status: Never Smoker  . Smokeless tobacco: Never Used  . Alcohol use No     Allergies   Lisinopril   Review of Systems Review of Systems  Respiratory: Negative for shortness of breath.   Cardiovascular: Positive for palpitations and leg swelling. Negative for chest pain.  Orthopnea.  All other systems reviewed and are negative.    Physical Exam Updated Vital Signs BP 111/90   Pulse 80   Temp 98 F (36.7 C) (Oral)   Resp 13   Ht 6' (1.829 m)   Wt 238 lb 15.7 oz (108.4 kg)   SpO2 98%   BMI 32.41 kg/m   Physical Exam  Constitutional: He is oriented to person, place, and time. He appears well-developed and well-nourished.  HENT:  Head: Normocephalic and atraumatic.  Right Ear: External ear normal.  Left Ear: External ear normal.  Nose: Nose normal.    Eyes: Right eye exhibits no discharge. Left eye exhibits no discharge.  Neck: Neck supple.  Cardiovascular: Normal heart sounds.   Irregularly irregular. HR in the low 100s.   Pulmonary/Chest: Effort normal and breath sounds normal.  Abdominal: Soft. There is no tenderness.  Musculoskeletal:  Pitting edema in the bilateral lower legs, ankles, and feet.   Neurological: He is alert and oriented to person, place, and time.  Skin: Skin is warm and dry.  Nursing note and vitals reviewed.    ED Treatments / Results  Labs (all labs ordered are listed, but only abnormal results are displayed) Labs Reviewed  BASIC METABOLIC PANEL - Abnormal; Notable for the following:       Result Value   Glucose, Bld 108 (*)    BUN 31 (*)    Creatinine, Ser 2.10 (*)    Calcium 8.8 (*)    GFR calc non Af Amer 33 (*)    GFR calc Af Amer 38 (*)    All other components within normal limits  BRAIN NATRIURETIC PEPTIDE - Abnormal; Notable for the following:    B Natriuretic Peptide 1,201.3 (*)    All other components within normal limits  CBC  I-STAT TROPOININ, ED    EKG  EKG Interpretation  Date/Time:  Thursday April 15 2016 23:01:27 EDT Ventricular Rate:  107 PR Interval:    QRS Duration: 114 QT Interval:  390 QTC Calculation: 520 R Axis:   107 Text Interpretation:  Atrial fibrillation with rapid ventricular response Rightward axis Incomplete right bundle branch block Nonspecific T wave abnormality Prolonged QT Abnormal ECG similar to Apr 12 2016 Confirmed by Regenia Skeeter MD, Jesica Goheen [phone]) on 04/16/2016 1:16:14 AM       Radiology Dg Chest 2 View  Result Date: 04/15/2016 CLINICAL DATA:  AFib shortness of breath and cough EXAM: CHEST  2 VIEW COMPARISON:  04/04/2016 FINDINGS: Small left-sided pleural effusion. Scarring at the right lung base. No consolidation. Stable cardiomegaly without overt failure. Minimal wedging at approximate L1 level. IMPRESSION: 1. Cardiomegaly without overt failure 2. Trace  left pleural effusion. 3. Stable scarring at the right lung base. Electronically Signed   By: Donavan Foil M.D.   On: 04/15/2016 23:38    Procedures Procedures (including critical care time)  Medications Ordered in ED Medications  sodium chloride 0.9 % bolus 500 mL (0 mLs Intravenous Stopped 04/16/16 0259)     Initial Impression / Assessment and Plan / ED Course  I have reviewed the triage vital signs and the nursing notes.  Pertinent labs & imaging results that were available during my care of the patient were reviewed by me and considered in my medical decision making (see chart for details).  Clinical Course as of Apr 16 416  Fri Apr 16, 2016  0224 Consult cards.  Given elevated BUN/creatinine, will try small fluid bolus  [SG]  0236 D/w cards, they  will see  [SG]    Clinical Course User Index [SG] Sherwood Gambler, MD    Patient appears stable but has had a gradual decline over 1 month. I don't think repeat emergent cardioversion is indicated. Now has acute on chronic renal failure, and likely CHF. No distress. Discussed with cards, they will admit.  Final Clinical Impressions(s) / ED Diagnoses   Final diagnoses:  Atrial fibrillation with RVR (HCC)  Acute on chronic systolic congestive heart failure (HCC)    New Prescriptions New Prescriptions   No medications on file   I personally performed the services described in this documentation, which was scribed in my presence. The recorded information has been reviewed and is accurate.    Sherwood Gambler, MD 04/16/16 [phone]

## 2016-04-16 NOTE — Telephone Encounter (Signed)
New Message  pt voiced is at ED and admitted pt to [facility].

## 2016-04-16 NOTE — ED Notes (Signed)
Attempted report x1. 

## 2016-04-16 NOTE — Telephone Encounter (Signed)
Called patient to let him know that Dr. Johnsie Cancel is aware that he is in the hospital. Patient just wanted to let Dr. Johnsie Cancel know why he had to cancel his appointment this morning.

## 2016-04-16 NOTE — ED Notes (Signed)
Main lab contacted to add on BNP

## 2016-04-16 NOTE — Care Management Note (Addendum)
Case Management Note  Patient Details  Name: [name] MRN: [id number] Date of Birth: [birth year]  Subjective/Objective:   From home with wife,   Presents with CHF, afib, AKI, abnormal cxr, conts on amio drip and heparin drip, picc placed yesterday, urine output worsening,conts on cvp monitoring , he has medication coverage and transportation, PCP is Dr. Laurann Montana.   NCM will cont to follow for dc needs.                Action/Plan:   Expected Discharge Date:                  Expected Discharge Plan:  Home/Self Care  In-House Referral:     Discharge planning Services  CM Consult  Post Acute Care Choice:    Choice offered to:     DME Arranged:    DME Agency:     HH Arranged:    HH Agency:     Status of Service:  In process, will continue to follow  If discussed at Long Length of Stay Meetings, dates discussed:    Additional Comments:  Zenon Mayo, RN 04/16/2016, 4:33 PM

## 2016-04-16 NOTE — H&P (Signed)
History & Physical    Patient ID: [name] MRN: [id number], DOB/AGE: 61-02-1955   Admit date: 04/16/2016   Primary Physician: Irven Shelling, MD Primary Cardiologist: Johnsie Cancel  Past Medical History    Past Medical History:  Diagnosis Date  . Asthma, persistent   . BPH (benign prostatic hyperplasia) 2014  . Combined hyperlipidemia   . GERD (gastroesophageal reflux disease)   . Hypertension   . 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 Obesity   . Renal stone 04/2012  . Seasonal allergic rhinitis     History reviewed. No pertinent surgical history.   Allergies  Allergies  Allergen Reactions  . Lisinopril Cough    History of Present Illness    Mr Boeve has a recent diagnosis of syst HF and Afib, he presented to the ED twice in the last month with Afib with RVR and was DCCV both times. NSR lasted only for days each time. He established care with Dr. Johnsie Cancel in Feb 2018 given his CV comorbidities.  He has been complaining of months long worsening of dyspnea.  Today he presents with a one day history of acutely progressive fatigues and SOB. Also with progressive swelling of LEE over last week. + for PND and orthopnea. No missed meds. He suspected again that his HR must have been high and hoped for another DCCV in the ED. IN the ED his HR is found to be low 100's. He is normotensive and has a new AKI (1.4-->2.1). ED concerned for dehydration and ordered IVF. Cards consult placed.   Echo 03/17/16 EF 25-30%  Diffuse hypokinesis Root 40 mm Mild MR  LA/RA  severely dilated  RV moderately dilated   Home Medications    Prior to Admission medications   Medication Sig Start Date End Date Taking? Authorizing Provider  albuterol (PROVENTIL HFA;VENTOLIN HFA) 108 (90 Base) MCG/ACT inhaler Inhale 2 puffs into the lungs every 6 (six) hours as needed for wheezing or shortness of breath.   Yes Historical Provider, MD  amiodarone (PACERONE) 200 MG tablet Take 2 tablets (400  mg total) by mouth 2 (two) times daily. 04/09/16  Yes Sherran Needs, NP  apixaban (ELIQUIS) 5 MG TABS tablet Take 1 tablet (5 mg total) by mouth 2 (two) times daily. 04/09/16  Yes Sherran Needs, NP  carvedilol (COREG) 12.5 MG tablet Take 1 tablet (12.5 mg total) by mouth 2 (two) times daily. 04/05/16  Yes Sherran Needs, NP  fluticasone furoate-vilanterol (BREO ELLIPTA) 100-25 MCG/INH AEPB Inhale 1 puff into the lungs daily.   Yes Historical Provider, MD  furosemide (LASIX) 20 MG tablet Take 1 tablet (20 mg total) by mouth every Monday, Wednesday, and Friday. 04/02/16 07/01/16 Yes Sherran Needs, NP  irbesartan (AVAPRO) 300 MG tablet Take 300 mg by mouth daily.   Yes Historical Provider, MD  pantoprazole (PROTONIX) 40 MG tablet Take 40 mg by mouth daily.   Yes Historical Provider, MD    Family History    No family history on file.  Social History    Social History   Social History  . Marital status: Married    Spouse name: N/A  . Number of children: N/A  . Years of education: N/A   Occupational History  . 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 Smoking status: Never Smoker  . Smokeless tobacco: Never Used  . Alcohol use No  . Drug use: No  . Sexual activity: Not on file  Other Topics Concern  . Not on file   Social History Narrative  . No narrative on file     Review of Systems    General:  No chills, fever, night sweats or weight changes.  Cardiovascular:  No chest pain, dyspnea on exertion, edema, orthopnea, palpitations, paroxysmal nocturnal dyspnea. Dermatological: No rash, lesions/masses Respiratory: No cough, dyspnea Urologic: No hematuria, dysuria Abdominal:   No nausea, vomiting, diarrhea, bright red blood per rectum, melena, or hematemesis Neurologic:  No visual changes, wkns, changes in mental status. All other systems reviewed and are otherwise negative except as noted above.  Physical Exam    Blood pressure 102/86, pulse (!) 107,  temperature 98 F (36.7 C), temperature source Oral, resp. rate (!) 21, height 6' (1.829 m), weight 108.9 kg (240 lb), SpO2 97 %.  General: ill appearing Psych: Normal affect. Neuro: Alert and oriented X 3. Moves all extremities spontaneously. HEENT: Normal  Neck: JVP elevated Lungs:  Resp regular and unlabored, CTA. Heart: RRR no s3, s4, or murmurs. Abdomen: Soft, non-tender, non-distended, BS + x 4.  Extremities: cold, 3 LEEE.  Labs    Troponin [facility] of Care Test)  Recent Labs  04/15/16 2322  TROPIPOC 0.04   No results for input(s): CKTOTAL, CKMB, TROPONINI in the last 72 hours. Lab Results  Component Value Date   WBC 6.5 04/15/2016   HGB 13.5 04/15/2016   HCT 40.6 04/15/2016   MCV 88.8 04/15/2016   PLT 196 04/15/2016    Recent Labs Lab 04/15/16 2309  NA 141  K 3.6  CL 109  CO2 22  BUN 31*  CREATININE 2.10*  CALCIUM 8.8*  GLUCOSE 108*   No results found for: CHOL, HDL, LDLCALC, TRIG No results found for: [facility]   Radiology Studies     CXR with bilateral pulm edema  ECG & Cardiac Imaging    Afib HR of 100  Assessment & Plan    Mr Capano has syst HF with unknown etiology. He was planned for a nuclear stress which was aborted given his tachycardia. He now presents with acute on chronic HF. He is fluid up and cool exam. He has a new AKI. Afib with Chadsvasc of 3. ECG with narrow complex tachycardia.   Plan:  - Will hold ARB - Will start aggressive iv diuresis with 80mg  BID - He will need LHC and possible RHC this admisison once renal function recovers. Would not be surprised if he has a very low cardiac index. I kept the Apixaban on for now.  - Once fluid optimized he will need to be uptitrated on his HF meds.  - DCCV is prolabially futile at this stage but can consider Afib ablation in the future once stabilized given increasing evidence of improved outcomes in HFrEF with rhythm control.  - Repeat TTE ordered. Will need to be considered for ICD in the  future,   Signed, Cristina Gong, MD 04/16/2016, 2:53 AM

## 2016-04-16 NOTE — Progress Notes (Signed)
Progress Note  Patient Name: [name] Date of Encounter: 04/16/2016  Primary Cardiologist: Dr. Johnsie Cancel   Subjective   Breathing ok sitting up. He still has orthopnea.   Inpatient Medications    Scheduled Meds: . amiodarone  400 mg Oral BID  . apixaban  5 mg Oral BID  . carvedilol  12.5 mg Oral BID  . furosemide  80 mg Intravenous BID  . pantoprazole  40 mg Oral Daily  . sodium chloride flush  3 mL Intravenous Q12H   Continuous Infusions:  PRN Meds: sodium chloride, acetaminophen, ondansetron (ZOFRAN) IV, sodium chloride flush   Vital Signs    Vitals:   04/16/16 0709 04/16/16 0730 04/16/16 0808 04/16/16 0811  BP: (!) 128/107 (!) 141/117 (!) 135/109   Pulse: (!) 111 (!) 121 (!) 108   Resp: 15 12 (!) 31   Temp:   98 F (36.7 C)   TempSrc:   Oral   SpO2: 96% 96% 100%   Weight:   239 lb 3.2 oz (108.5 kg) 239 lb 3.2 oz (108.5 kg)  Height:   6' (1.829 m)     Intake/Output Summary (Last 24 hours) at 04/16/16 1026 Last data filed at 04/16/16 0900  Gross per 24 hour  Intake              370 ml  Output              500 ml  Net             -130 ml   Filed Weights   04/16/16 0304 04/16/16 0808 04/16/16 0811  Weight: 238 lb 15.7 oz (108.4 kg) 239 lb 3.2 oz (108.5 kg) 239 lb 3.2 oz (108.5 kg)    Telemetry    afib w/ RVR - Personally Reviewed  ECG    afib w/ RVR - Personally Reviewed  Physical Exam   GEN: No acute distress.   Neck: No JVD Cardiac: irregularly irregular rhythm, tachy rate, no murmurs, rubs, or gallops.  Respiratory: faint rales at the bases. GI: Soft, nontender, non-distended  MS: 3+ bilateral LE edema; No deformity. Neuro:  Nonfocal  Psych: Normal affect   Labs    Chemistry Recent Labs Lab 04/15/16 2309  NA 141  K 3.6  CL 109  CO2 22  GLUCOSE 108*  BUN 31*  CREATININE 2.10*  CALCIUM 8.8*  GFRNONAA 33*  GFRAA 38*  ANIONGAP 10     Hematology Recent Labs Lab 04/15/16 2309  WBC 6.5  RBC 4.57  HGB 13.5  HCT 40.6    MCV 88.8  MCH 29.5  MCHC 33.3  RDW 13.9  PLT 196    Cardiac EnzymesNo results for input(s): TROPONINI in the last 168 hours.  Recent Labs Lab 04/15/16 2322  TROPIPOC 0.04     BNP Recent Labs Lab 04/16/16 0200  BNP 1,201.3*     DDimer No results for input(s): DDIMER in the last 168 hours.   Radiology    Dg Chest 2 View  Result Date: 04/15/2016 CLINICAL DATA:  AFib shortness of breath and cough EXAM: CHEST  2 VIEW COMPARISON:  04/04/2016 FINDINGS: Small left-sided pleural effusion. Scarring at the right lung base. No consolidation. Stable cardiomegaly without overt failure. Minimal wedging at approximate L1 level. IMPRESSION: 1. Cardiomegaly without overt failure 2. Trace left pleural effusion. 3. Stable scarring at the right lung base. Electronically Signed   By: Donavan Foil M.D.   On: 04/15/2016 23:38    Cardiac Studies  2D Echo 03/17/16  Study Conclusions  - Left ventricle: The cavity size was severely dilated. Wall   thickness was normal. Systolic function was severely reduced. The   estimated ejection fraction was in the range of 25% to 30%.   Diffuse hypokinesis. The study is not technically sufficient to   allow evaluation of LV diastolic function. - Aorta: Aortic root dimension: 40 mm (ED). Ascending aortic   diameter: 39 mm (S). - Ascending aorta: The ascending aorta was mildly dilated. - Mitral valve: Mildly thickened leaflets . There was mild   regurgitation. - Left atrium: Severely dilated. - Right ventricle: Moderately dilated. Wall thickness was normal.   Systolic function is moderately reduced. - Right atrium: Severely dilated. - Tricuspid valve: There was mild regurgitation. - Pulmonary arteries: PA peak pressure: 31 mm Hg (S). - Inferior vena cava: The vessel was dilated. The respirophasic   diameter changes were blunted (< 50%), consistent with elevated   central venous pressure.  Impressions:  - LVEF 25-30%, severely dilated LV with  normal wall thickness,   dilated aortic root to 4 cm, mild MR, severe biatrial enlargment,   moderate RVE with reduced RV systolic function, mild TR, RVSP 31   mmHg, dilated IVC.  Patient Profile     61 y/o male, followed by Dr. Johnsie Cancel, with recent diagnosis of systolic HF (EF 03-50%) and Afib, on Eliquis. He has not had any ischemic eval (outpatient NST was ordered, never completed). He has difficult to control afib and has been followed recently in the Afib clinic. He presented to the ED twice in the last month with Afib with RVR and was DCCV both times. NSR lasted only for days each time. Recently started on oral amiodarone (not a candidate for other antiarrythmics). He presented to the ED overnight with complaint of dyspnea and progressive LEE. BNP 1,200 and BMP suggest AKI.  CXR with cardiomegaly, trace left pleural effusion and scarring at the right lung base.   Assessment & Plan     1. Acute on Chronic Systolic CHF: EF 09-38%. Pt is volume overloaded with 3+ bilateral LEE.  Continue diuresis with IV Lasix. Strict I/os and daily weights. Monitor renal function closely. Recommend R/LHC once he is diuresed.   2. Atrial Fibrillation: afib has been difficult to control. He has had 2 failed DCCV within the last month. He is being followed in the afib clinic and recently started on oral amiodarone. He is not a candidate for other therapies. Not a candidate for multaq or flecainide due to EF of 20%. His EKG shows a  prolonged qtc(in SR over 500 ms) and is not a candidate for sotalol or tikosyn. With left atrium size of 54 mm, it may be difficult to keep in SR. Not an ideal  ablation candidate with enlarged left atrium. Continue amiodarone for now. Monitor closely on telemetry. Hopefully, rate will improve with diuresis.   3. Acute Kidney Injury: Bump in Scr to 2.10. Baseline SCr is 1.4-1.6. BUN is 31 currently. Hopefully renal function will improve with diuresis.   4. Abnormal CXR: CXR shows  scarring at the right lung base. He was recently started on amiodarone. Currently on 400 mg BID. PFTs were ordered by the afib clinic on 04/12/16, but not yet completed. We will need to obtain PFTs to see if amiodarone should be continued.   Signed, Lyda Jester, PA-C  04/16/2016, 10:26 AM    I have personally seen and examined this patient with Lyda Jester, PA-C. I  agree with the assessment and plan as outlined above.  He will be diuresed today with IV Lasix. Rate is 100s on po amiodarone. Will need cath before discharge. Will need to hold his anticoagulation over the weekend. Long discussion with pt and his wife this am about his presentation.   The patient was seen earlier today by the fellow on call and admitted. NO billing charges placed by me today.  Lauree Chandler 04/16/2016 11:18 AM

## 2016-04-17 ENCOUNTER — Inpatient Hospital Stay (HOSPITAL_COMMUNITY): Payer: PRIVATE HEALTH INSURANCE

## 2016-04-17 DIAGNOSIS — I5023 Acute on chronic systolic (congestive) heart failure: Secondary | ICD-10-CM | POA: Diagnosis not present

## 2016-04-17 DIAGNOSIS — R9431 Abnormal electrocardiogram [ECG] [EKG]: Secondary | ICD-10-CM | POA: Diagnosis not present

## 2016-04-17 LAB — ECHOCARDIOGRAM COMPLETE
AOASC: 31 cm
CHL CUP MV DEC (S): 208
E decel time: 208 msec
FS: 7 % — AB (ref 28–44)
HEIGHTINCHES: 72 in
IV/PV OW: 1.07
LA ID, A-P, ES: 44 mm
LA diam end sys: 44 mm
LA vol index: 59.1 mL/m2
LADIAMINDEX: 1.91 cm/m2
LAVOL: 136 mL
LAVOLA4C: 168 mL
LV PW d: 10.7 mm — AB (ref 0.6–1.1)
LVOT SV: 39 mL
LVOT VTI: 10.3 cm
LVOT area: 3.8 cm2
LVOT diameter: 22 mm
LVOTPV: 77.7 cm/s
MV VTI: 108 cm
MVPG: 3 mmHg
MVPKEVEL: 81 m/s
PISA EROA: 0.18 cm2
TAPSE: 19.5 mm
WEIGHTICAEL: 3820.13 [oz_av]

## 2016-04-17 LAB — BASIC METABOLIC PANEL
Anion gap: 11 (ref 5–15)
BUN: 29 mg/dL — ABNORMAL HIGH (ref 6–20)
CO2: 27 mmol/L (ref 22–32)
Calcium: 8.9 mg/dL (ref 8.9–10.3)
Chloride: 105 mmol/L (ref 101–111)
Creatinine, Ser: 2.21 mg/dL — ABNORMAL HIGH (ref 0.61–1.24)
GFR calc Af Amer: 35 mL/min — ABNORMAL LOW (ref >60)
GFR calc non Af Amer: 31 mL/min — ABNORMAL LOW (ref >60)
Glucose, Bld: 94 mg/dL (ref 65–99)
Potassium: 3 mmol/L — ABNORMAL LOW (ref 3.5–5.1)
Sodium: 143 mmol/L (ref 135–145)

## 2016-04-17 MED ORDER — POTASSIUM CHLORIDE CRYS ER 20 MEQ PO TBCR
40.0000 meq | EXTENDED_RELEASE_TABLET | Freq: Two times a day (BID) | ORAL | Status: AC
  Administered 2016-04-17 (×2): 40 meq via ORAL
  Filled 2016-04-17 (×2): qty 2

## 2016-04-17 MED ORDER — PERFLUTREN LIPID MICROSPHERE
1.0000 mL | INTRAVENOUS | Status: AC | PRN
  Administered 2016-04-17: 3 mL via INTRAVENOUS
  Filled 2016-04-17 (×2): qty 10

## 2016-04-17 MED ORDER — FLUTICASONE FUROATE-VILANTEROL 100-25 MCG/INH IN AEPB
1.0000 | INHALATION_SPRAY | Freq: Every day | RESPIRATORY_TRACT | Status: DC
  Administered 2016-04-17 – 2016-04-27 (×9): 1 via RESPIRATORY_TRACT
  Filled 2016-04-17: qty 28

## 2016-04-17 NOTE — Progress Notes (Signed)
  Echocardiogram 2D Echocardiogram has been performed.  [name] 04/17/2016, 3:59 PM

## 2016-04-17 NOTE — Progress Notes (Signed)
Subjective:    SOB improving.   Objective:   Temp:  [97.8 F (36.6 C)-98.3 F (36.8 C)] 97.8 F (36.6 C) (03/24 0410) Pulse Rate:  [43-116] 116 (03/24 0726) Resp:  [11-29] 19 (03/24 0726) BP: (109-134)/(67-107) 134/107 (03/24 0726) SpO2:  [96 %-98 %] 97 % (03/24 0726) Weight:  [238 lb 12.1 oz (108.3 kg)] 238 lb 12.1 oz (108.3 kg) (03/24 0500) Last BM Date: 04/16/16  Filed Weights   04/16/16 0808 04/16/16 0811 04/17/16 0500  Weight: 239 lb 3.2 oz (108.5 kg) 239 lb 3.2 oz (108.5 kg) 238 lb 12.1 oz (108.3 kg)    Intake/Output Summary (Last 24 hours) at 04/17/16 1103 Last data filed at 04/17/16 1051  Gross per 24 hour  Intake             1200 ml  Output             4425 ml  Net            -3225 ml    Telemetry:afib, rates 100-120  Exam:  General: NAD  HEENT: sclera clear, throat clear  Resp: CTAB  Cardiac: irreg, no m/r/g  GI: abodomen soft, NT, Nd  MSK: 1-2+ bilateral LE edema  Neuro: no focal deficits  Psych: appropriate affect  Lab Results:  Basic Metabolic Panel:  Recent Labs Lab 04/15/16 2309 04/17/16 0240  NA 141 143  K 3.6 3.0*  CL 109 105  CO2 22 27  GLUCOSE 108* 94  BUN 31* 29*  CREATININE 2.10* 2.21*  CALCIUM 8.8* 8.9    Liver Function Tests: No results for input(s): AST, ALT, ALKPHOS, BILITOT, PROT, ALBUMIN in the last 168 hours.  CBC:  Recent Labs Lab 04/15/16 2309  WBC 6.5  HGB 13.5  HCT 40.6  MCV 88.8  PLT 196    Cardiac Enzymes: No results for input(s): CKTOTAL, CKMB, CKMBINDEX, TROPONINI in the last 168 hours.  BNP:  Recent Labs  03/30/16 1039  PROBNP 3,730*    Coagulation: No results for input(s): INR in the last 168 hours.  ECG:   Medications:   Scheduled Medications: . amiodarone  400 mg Oral BID  . apixaban  5 mg Oral BID  . carvedilol  12.5 mg Oral BID  . furosemide  80 mg Intravenous BID  . pantoprazole  40 mg Oral Daily  . sodium chloride flush  3 mL Intravenous Q12H      Infusions:   PRN Medications:  sodium chloride, acetaminophen, ondansetron (ZOFRAN) IV, sodium chloride flush     Assessment/Plan    1. Acute on chronic systolic HF - echo 01/6107 LVEF 25-30%, diffuse hypokinesis. Moderate RV dysfunction, dilated IVC - presented volume overloaded. Negative 2.6 liters yesterday, negative 3.4 liters since admission - he is on lasix 80mg  IV bid, mild uptrend in Cr.  - medical therapy with coreg 12.5mg  bid. ARB held dueot o aki.  - plan for RHC/LHC once diuresed and pending renal function.  - still volume overloaded, continue IV diuresis today.   2. Afib - multiple recent electircal cardioversions.  - currently on amio 400mg  bid, coreg 12.5mg  bid. Eliquis 5mg  bid for stroke prevention - limited antiarrhythmic options due to stuctural heart disease, renal disease, and long QT.  - will need outpatient PFTs on amio - will hold anticoag for possible cath. There is no strong indication for heparin bridging at this time.  - rates low 100s this AM. Soft bp's. We will follow today, consider increasing coreg  pending bp trend. Could consider digoxin, though require caution given his poor renal funciton.   3. AKI - baseline Cr 1.5, up to 2.1 today. Remains volume overloaded. Continue diuresis, follow renal function closely. May improve with decreased venous congestion  4. Hypokalemia - replace orally    [name], M.D.

## 2016-04-18 DIAGNOSIS — I5021 Acute systolic (congestive) heart failure: Secondary | ICD-10-CM | POA: Diagnosis not present

## 2016-04-18 LAB — BASIC METABOLIC PANEL
ANION GAP: 13 (ref 5–15)
BUN: 26 mg/dL — ABNORMAL HIGH (ref 6–20)
CO2: 27 mmol/L (ref 22–32)
CREATININE: 2.01 mg/dL — AB (ref 0.61–1.24)
Calcium: 9.1 mg/dL (ref 8.9–10.3)
Chloride: 103 mmol/L (ref 101–111)
GFR calc non Af Amer: 34 mL/min — ABNORMAL LOW (ref >60)
GFR, EST AFRICAN AMERICAN: 40 mL/min — AB (ref >60)
Glucose, Bld: 99 mg/dL (ref 65–99)
POTASSIUM: 2.9 mmol/L — AB (ref 3.5–5.1)
SODIUM: 143 mmol/L (ref 135–145)

## 2016-04-18 LAB — MAGNESIUM: Magnesium: 2 mg/dL (ref 1.7–2.4)

## 2016-04-18 MED ORDER — POTASSIUM CHLORIDE CRYS ER 20 MEQ PO TBCR
40.0000 meq | EXTENDED_RELEASE_TABLET | ORAL | Status: AC
  Administered 2016-04-18 (×3): 40 meq via ORAL
  Filled 2016-04-18 (×3): qty 2

## 2016-04-18 NOTE — Plan of Care (Signed)
Problem: Cardiac: Goal: Ability to achieve and maintain adequate cardiopulmonary perfusion will improve Outcome: Progressing Pt HR 100-120 aflutter

## 2016-04-18 NOTE — Progress Notes (Signed)
Subjective:    SOB improving.   Objective:   Temp:  [97.2 F (36.2 C)-98.3 F (36.8 C)] 98 F (36.7 C) (03/25 0728) Pulse Rate:  [69-115] 112 (03/25 0728) Resp:  [10-24] 24 (03/25 0728) BP: (98-117)/(85-98) 116/98 (03/25 0728) SpO2:  [92 %-99 %] 99 % (03/25 0813) Weight:  [230 lb 13.2 oz (104.7 kg)] 230 lb 13.2 oz (104.7 kg) (03/25 0417) Last BM Date: 04/18/16  Filed Weights   04/16/16 0811 04/17/16 0500 04/18/16 0417  Weight: 239 lb 3.2 oz (108.5 kg) 238 lb 12.1 oz (108.3 kg) 230 lb 13.2 oz (104.7 kg)    Intake/Output Summary (Last 24 hours) at 04/18/16 1111 Last data filed at 04/18/16 0900  Gross per 24 hour  Intake              720 ml  Output             2925 ml  Net            -2205 ml    Telemetry:afib, rates 100-120  Exam:  General: NAD  HEENT: sclera clear, throat clear  Resp: CTAB  Cardiac: irreg, no m/r/g  GI: abodomen soft, NT, Nd  MSK: 1-2+ bilateral LE edema  Neuro: no focal deficits  Psych: appropriate affect  Lab Results:  Basic Metabolic Panel:  Recent Labs Lab 04/15/16 2309 04/17/16 0240 04/18/16 0259  NA 141 143 143  K 3.6 3.0* 2.9*  CL 109 105 103  CO2 22 27 27   GLUCOSE 108* 94 99  BUN 31* 29* 26*  CREATININE 2.10* 2.21* 2.01*  CALCIUM 8.8* 8.9 9.1  MG  --   --  2.0    Liver Function Tests: No results for input(s): AST, ALT, ALKPHOS, BILITOT, PROT, ALBUMIN in the last 168 hours.  CBC:  Recent Labs Lab 04/15/16 2309  WBC 6.5  HGB 13.5  HCT 40.6  MCV 88.8  PLT 196    Cardiac Enzymes: No results for input(s): CKTOTAL, CKMB, CKMBINDEX, TROPONINI in the last 168 hours.  BNP:  Recent Labs  03/30/16 1039  PROBNP 3,730*    Coagulation: No results for input(s): INR in the last 168 hours.  ECG:   Medications:   Scheduled Medications: . amiodarone  400 mg Oral BID  . carvedilol  12.5 mg Oral BID  . fluticasone furoate-vilanterol  1 puff Inhalation Daily  . furosemide  80 mg Intravenous BID  .  pantoprazole  40 mg Oral Daily  . sodium chloride flush  3 mL Intravenous Q12H    Infusions:   PRN Medications: sodium chloride, acetaminophen, ondansetron (ZOFRAN) IV, sodium chloride flush     Assessment/Plan    1. Acute on chronic systolic HF - echo 01/2876 LVEF 25-30%, diffuse hypokinesis. Moderate RV dysfunction, dilated IVC - presented volume overloaded. Net negative 5.5 liters since admission - he is on lasix 80mg  IV bid, creatinine has improved since yesterday.  - medical therapy with coreg 12.5mg  bid. ARB held due to [facility].  - plan for RHC/LHC once diuresed and pending renal function.  - still volume overloaded, continue IV diuresis today.   2. Afib - multiple recent electircal cardioversions.  - currently on amio 400mg  bid, coreg 12.5mg  bid. Eliquis 5mg  bid for stroke prevention - limited antiarrhythmic options due to stuctural heart disease, renal disease, and long QT.  - Shiri Hodapp need outpatient PFTs on amio - Ronit Cranfield hold anticoag for possible cath. There is no strong indication for heparin bridging at this  time.  - rates low 100s this AM. Soft bp's. We Zael Shuman follow today, consider increasing coreg pending bp trend. Could switch to metoprolol as this has a lesser BP effect. Could consider digoxin, though require caution given his poor renal funciton.   3. AKI - baseline Cr 1.5, up to 2.1 today. Remains volume overloaded. Continue diuresis, renal function improved since yesterday. May improve with decreased venous congestion  4. Hypokalemia - replace orally  Reann Dobias Curt Bears, MD 04/18/2016 11:20 AM

## 2016-04-19 ENCOUNTER — Inpatient Hospital Stay (HOSPITAL_COMMUNITY): Payer: PRIVATE HEALTH INSURANCE

## 2016-04-19 DIAGNOSIS — I5023 Acute on chronic systolic (congestive) heart failure: Secondary | ICD-10-CM | POA: Diagnosis not present

## 2016-04-19 DIAGNOSIS — I5021 Acute systolic (congestive) heart failure: Secondary | ICD-10-CM | POA: Diagnosis not present

## 2016-04-19 DIAGNOSIS — I4891 Unspecified atrial fibrillation: Secondary | ICD-10-CM | POA: Diagnosis not present

## 2016-04-19 LAB — BASIC METABOLIC PANEL
Anion gap: 12 (ref 5–15)
BUN: 26 mg/dL — AB (ref 6–20)
CHLORIDE: 104 mmol/L (ref 101–111)
CO2: 26 mmol/L (ref 22–32)
CREATININE: 2.21 mg/dL — AB (ref 0.61–1.24)
Calcium: 9 mg/dL (ref 8.9–10.3)
GFR calc Af Amer: 35 mL/min — ABNORMAL LOW (ref >60)
GFR, EST NON AFRICAN AMERICAN: 31 mL/min — AB (ref >60)
GLUCOSE: 94 mg/dL (ref 65–99)
POTASSIUM: 3.5 mmol/L (ref 3.5–5.1)
SODIUM: 142 mmol/L (ref 135–145)

## 2016-04-19 LAB — APTT: APTT: 48 s — AB (ref 24–36)

## 2016-04-19 MED ORDER — HEPARIN (PORCINE) IN NACL 100-0.45 UNIT/ML-% IJ SOLN
1100.0000 [IU]/h | INTRAMUSCULAR | Status: DC
  Administered 2016-04-19 – 2016-04-20 (×2): 1400 [IU]/h via INTRAVENOUS
  Administered 2016-04-21 (×2): 1100 [IU]/h via INTRAVENOUS
  Filled 2016-04-19 (×2): qty 250

## 2016-04-19 MED ORDER — TRAZODONE HCL 50 MG PO TABS
50.0000 mg | ORAL_TABLET | Freq: Once | ORAL | Status: AC
Start: 2016-04-19 — End: 2016-04-19
  Administered 2016-04-19: 50 mg via ORAL
  Filled 2016-04-19: qty 1

## 2016-04-19 MED ORDER — LOPERAMIDE HCL 2 MG PO CAPS
2.0000 mg | ORAL_CAPSULE | ORAL | Status: DC | PRN
  Administered 2016-04-19 – 2016-04-26 (×7): 2 mg via ORAL
  Filled 2016-04-19 (×7): qty 1

## 2016-04-19 MED ORDER — POTASSIUM CHLORIDE CRYS ER 20 MEQ PO TBCR
40.0000 meq | EXTENDED_RELEASE_TABLET | ORAL | Status: AC
  Administered 2016-04-19 (×3): 40 meq via ORAL
  Filled 2016-04-19 (×3): qty 2

## 2016-04-19 MED ORDER — AMIODARONE HCL IN DEXTROSE 360-4.14 MG/200ML-% IV SOLN
60.0000 mg/h | INTRAVENOUS | Status: AC
  Administered 2016-04-19: 60 mg/h via INTRAVENOUS
  Filled 2016-04-19: qty 200

## 2016-04-19 MED ORDER — SODIUM CHLORIDE 0.9% FLUSH
10.0000 mL | Freq: Two times a day (BID) | INTRAVENOUS | Status: DC
  Administered 2016-04-19 – 2016-04-20 (×2): 10 mL
  Administered 2016-04-21: 20 mL
  Administered 2016-04-22 – 2016-04-26 (×9): 10 mL

## 2016-04-19 MED ORDER — AMIODARONE LOAD VIA INFUSION
150.0000 mg | Freq: Once | INTRAVENOUS | Status: AC
  Administered 2016-04-19: 150 mg via INTRAVENOUS
  Filled 2016-04-19: qty 83.34

## 2016-04-19 MED ORDER — SODIUM CHLORIDE 0.9% FLUSH: 10.0000 mL | INTRAVENOUS | Status: DC | PRN

## 2016-04-19 MED ORDER — AMIODARONE HCL IN DEXTROSE 360-4.14 MG/200ML-% IV SOLN
30.0000 mg/h | INTRAVENOUS | Status: DC
  Administered 2016-04-19 – 2016-04-21 (×3): 30 mg/h via INTRAVENOUS
  Filled 2016-04-19 (×4): qty 200

## 2016-04-19 NOTE — Progress Notes (Signed)
ANTICOAGULATION CONSULT NOTE - Initial Consult  Pharmacy Consult for Heparin Indication: atrial fibrillation  Allergies  Allergen Reactions  . Lisinopril Cough    Patient Measurements: Height: 6' (182.9 cm) Weight: 221 lb 9 oz (100.5 kg) IBW/kg (Calculated) : 77.6 Heparin Dosing Weight: 98 kg  Vital Signs: Temp: 98.4 F (36.9 C) (03/26 1209) Temp Source: Oral (03/26 1209) BP: 99/82 (03/26 1209) Pulse Rate: 114 (03/26 1209)  Labs:  Recent Labs  04/17/16 0240 04/18/16 0259 04/19/16 0406  CREATININE 2.21* 2.01* 2.21*    Estimated Creatinine Clearance: 43.6 mL/min (A) (by C-G formula based on SCr of 2.21 mg/dL (H)).   Medical History: Past Medical History:  Diagnosis Date  . Asthma, persistent   . BPH (benign prostatic hyperplasia) 2014  . Combined hyperlipidemia   . GERD (gastroesophageal reflux disease)   . Hypertension   . 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 Obesity   . Renal stone 04/2012  . Seasonal allergic rhinitis      Assessment: 2 YOM who presented on 3/23 with SOB, fatigue, and edema concerning for acute on chronic HF exacerbation. The patient was on apixaban PTA for hx Afib - this was held starting on 3/24 for possible cath plans. Pharmacy has now been consulted to start a heparin bridge. Will obtain a baseline aPTT/HL and plan to use aPTTs initially for monitoring (noted AKI so may be holding on to apixaban). The patient's last dose of apixaban was 3/24 AM at 0746.  Goal of Therapy:  Heparin level 0.3-0.7 units/ml Monitor platelets by anticoagulation protocol: Yes   Plan:  1. Start Heparin at 1400 units/hr (14 ml/hr) 2. Daily heparin levels + aPTT until correlated, CBC 3. Will continue to monitor for any signs/symptoms of bleeding and will follow up with aPTT in 6 hours   Thank you for allowing pharmacy to be a part of this patient's care.  Alycia Rossetti, PharmD, BCPS Clinical Pharmacist Pager: [phone] Clinical phone for  04/19/2016 from 7a-3:30p: [phone] If after 3:30p, please call main pharmacy at: [phone] 04/19/2016 2:59 PM

## 2016-04-19 NOTE — Progress Notes (Signed)
1538Peripherally Inserted Central Catheter/Midline Placement  The IV Nurse has discussed with the patient and/or persons authorized to consent for the patient, the purpose of this procedure and the potential benefits and risks involved with this procedure.  The benefits include less needle sticks, lab draws from the catheter, and the patient may be discharged home with the catheter. Risks include, but not limited to, infection, bleeding, blood clot (thrombus formation), and puncture of an artery; nerve damage and irregular heartbeat and possibility to perform a PICC exchange if needed/ordered by physician.  Alternatives to this procedure were also discussed.  Bard Power PICC patient education guide, fact sheet on infection prevention and patient information card has been provided to patient /or left at bedside.    PICC/Midline Placement Documentation  PICC Triple Lumen 96/43/83 PICC Right Basilic 43 cm 0 cm (Active)       Jule Economy Horton 04/19/2016, 6:34 PM

## 2016-04-19 NOTE — Progress Notes (Signed)
Late Entry  Amiodarone drip started at 1615. Immediately after starting drip patient's BP dropped to 76/52, 81/69, and 88/74 (while rechecking BP's every 15 minutes). His pressures did regulate and have since maintained SBP >95 and MAP >80. HR remained stable. Patient stated he did feel slightly dizzy/lightheaded while his pressures were low. Cardiology PA notified - stated it was normal and no need to be concerned at this time. No new orders received.   Patient with only one PIV access at this time. Amiodarone and heparin are not compatible and therefore heparin drip has been unable to be started. Awaiting second PIV placement by IV team OR PICC line placement, per MD orders. Cardiology PA also notified and aware of this.   1830 - IV team currently at bedside to place PICC line.  1840 - Received call from Dr. Burt Knack. Updated with all of the above.   Joellen Jersey, RN

## 2016-04-19 NOTE — Progress Notes (Signed)
Progress Note  Patient Name: [name] Date of Encounter: 04/19/2016  Primary Cardiologist: Dr. Johnsie Cancel  Subjective   Pt doing much better. His weight is down.  19 lbs from admission. His SOB has improved greatly. He can now lay flat. He is currently NPO.  Inpatient Medications    Scheduled Meds: . amiodarone  400 mg Oral BID  . carvedilol  12.5 mg Oral BID  . fluticasone furoate-vilanterol  1 puff Inhalation Daily  . furosemide  80 mg Intravenous BID  . pantoprazole  40 mg Oral Daily  . sodium chloride flush  3 mL Intravenous Q12H   Continuous Infusions:  PRN Meds: sodium chloride, acetaminophen, loperamide, ondansetron (ZOFRAN) IV, sodium chloride flush   Vital Signs    Vitals:   04/19/16 0300 04/19/16 0500 04/19/16 0827 04/19/16 1026  BP: 107/87  (!) 111/96   Pulse: (!) 110  (!) 126   Resp: (!) 9  19   Temp: 97.9 F (36.6 C)  98.4 F (36.9 C)   TempSrc: Oral  Oral   SpO2: 96%  95% 93%  Weight:  221 lb 9 oz (100.5 kg)    Height:        Intake/Output Summary (Last 24 hours) at 04/19/16 1124 Last data filed at 04/19/16 1040  Gross per 24 hour  Intake             1160 ml  Output             2950 ml  Net            -1790 ml   Filed Weights   04/17/16 0500 04/18/16 0417 04/19/16 0500  Weight: 238 lb 12.1 oz (108.3 kg) 230 lb 13.2 oz (104.7 kg) 221 lb 9 oz (100.5 kg)    Telemetry     atrial fibrillation. HR 100-115  Personally Reviewed  ECG    None since 04/16/16 - Personally Reviewed  Physical Exam   GEN: No acute distress but ill appearing. Neck: JVD elevated Cardiac: RRR, no murmurs, rubs, or gallops.  Respiratory: unlabored GI: Soft, nontender, non-distended  MS:  No deformity. +lower extremity edema Neuro:  Nonfocal  Psych: Normal affect   Labs    Chemistry Recent Labs Lab 04/17/16 0240 04/18/16 0259 04/19/16 0406  NA 143 143 142  K 3.0* 2.9* 3.5  CL 105 103 104  CO2 27 27 26   GLUCOSE 94 99 94  BUN 29* 26* 26*  CREATININE  2.21* 2.01* 2.21*  CALCIUM 8.9 9.1 9.0  GFRNONAA 31* 34* 31*  GFRAA 35* 40* 35*  ANIONGAP 11 13 12      Hematology Recent Labs Lab 04/15/16 2309  WBC 6.5  RBC 4.57  HGB 13.5  HCT 40.6  MCV 88.8  MCH 29.5  MCHC 33.3  RDW 13.9  PLT 196     Recent Labs Lab 04/15/16 2322  TROPIPOC 0.04     BNP Recent Labs Lab 04/16/16 0200  BNP 1,201.3*    Radiology   2 view Chest xray  04/15/16 1. Cardiomegaly without overt failure 2. Trace left pleural effusion. 3. Stable scarring at the right lung base.  Cardiac Studies   RH/LH cath pending  Transthoracic Echocardiography 04/17/2016  Study Conclusions  - Left ventricle: The cavity size was severely dilated. There was   mild concentric hypertrophy. Systolic function was severely   reduced. The estimated ejection fraction was in the range of   10-15%. Diffuse hypokinesis. The study is not technically  sufficient to allow evaluation of LV diastolic function. - Aortic valve: There was no regurgitation. - Aortic root: The aortic root was normal in size. - Ascending aorta: The ascending aorta was normal in size. - Mitral valve: There was mild to moderate regurgitation directed   centrally. - Left atrium: The atrium was moderately to severely dilated. - Right ventricle: Systolic function was normal. - Right atrium: The atrium was moderately dilated. - Tricuspid valve: There was mild regurgitation. - Pulmonary arteries: Systolic pressure was within the normal   range. - Inferior vena cava: The vessel was normal in size. - Pericardium, extracardiac: There was no pericardial effusion.  Impressions:  - Compared to the prior study from 03/17/2016 LVEF has decreased   from 25-30% to 10-15%.   There is heavy smoke in the LV apex seen on contrast images   consistent with pre-thrombotic state. No organized thrombus was   seen.   An anticoagulation is recommended.  Patient Profile     61 y/o male, followed by Dr.  Johnsie Cancel, with recent diagnosis of systolic HF (EF 59-45%) and Afib, on Eliquis. He has not had any ischemic eval (outpatient NST was ordered, never completed). He has difficult to control afib and has been followed recently in the Afib clinic. He presented to the ED twice in the last month with Afib with RVR and was DCCV both times. NSR lasted only for days each time. Recently started on oral amiodarone (not a candidate for other antiarrythmics). He presented to the ED with complaint of dyspnea and progressive LEE. BNP 1,200 and BMP suggest AKI.  CXR with cardiomegaly, trace left pleural effusion and scarring at the right lung base.   Assessment & Plan    Acute on chronic systolic HF -- echo 8/59/29: Cavity size severely dilated, severely reduced systolic function, EF 24-46%. Diffuse hypokineses. Unable to assess LV diastolic function, concern for pre-thrombotic state in LV apex-- anticoagulation is recommended. --presented volume overloaded. Net negative 7.35 liters since admission,  Weight down 19 lbs. --he is on lasix 80mg  IV bid, creatinine has declined ( 2.01 <<<  2.21)  recommend decreasing lasix dose to 40 mg IV BID and cont to watch SCr. --medical therapy with coreg 12.5mg  bid. ARB held due to [facility].  --plan for RHC/LHC once diuresed and pending renal function.   Afib --multiple recent electircal cardioversions.  --currently on amio 400mg  bid and coreg 12.5mg  bid.  --Eliquis 5mg  bid discontinued 3/24, blood thinner being held for cath. --limited antiarrhythmic options due to stuctural heart disease, renal disease, and long QT per Dr. Curt Bears --will need outpatient PFTs on amio per Dr. Curt Bears --rates low 100s this AM. Soft bp's. We will follow --Consider increasing coreg pending bp trend. Could switch to metoprolol as this has a lesser BP effect. Could consider digoxin, though require caution given his poor renal function (per Dr. Curt Bears. --switch to Metoprolol for better rate control? BPs  are still soft.  AKI --baseline Cr 1.5, up to 2.21 today. Remains volume overloaded. Continue diuresis but it has slowed down, renal function decreased. Decrease dose of lasix.  Hypokalemia --resolved, K 3.5  Eliquis being held for cath, not currently on blood thinners, does pt need a Heparin bridge due to Echo finding of concern for pre-thrombotic state in LV apex? Currently NPO for possible cath. Is patient cleared for cath today? Will discuss with Dr. Burt Knack.  Kristopher Glee, PA-C  04/19/2016, 11:24 AM    Patient seen, examined. Available data reviewed. Agree with  findings, assessment, and plan as outlined by Delos Haring, PA-C. On exam, pt sitting up in chair, NAD. JVP moderately elevated. Lungs clear, heart is tachy and irregular without murmur. Abdomen is soft, NT, no organomegaly. There is 2+ pedal edema bilaterally.   All data reviewed. Pt > 7L negative and weight down 19# but question accuracy 9# weight loss from yesterday to today. Echo reveiwed and patient has very severe LV dysfunction with LVEF 10-15% and LV smoke/stasis. BP is marginal and he complains of dizziness, nausea - concerning picture for low-output state.   Plan:  Hold off on cardiac cath  Start IV amiodarone for better rate control  Start IV heparin  Place PICC line and measure co-ox/CVP  Consider DCCV once more fully amiodarone-loaded (8 G to date)  The patient is critically ill with multiple organ systems failure and requires high complexity decision making for assessment and support, frequent evaluation and titration of therapies, application of advanced monitoring technologies and extensive interpretation of multiple databases.   Critical Care Time devoted to patient care services described in this note is 35 minutes.  Plan discussed at length with patient and wife. Also discussed his case with Dr Haroldine Laws. May ask Advanced HF team to see patient in formal consultation pending results of  above measures.  Sherren Mocha, M.D. 04/19/2016 1:35 PM

## 2016-04-20 ENCOUNTER — Encounter (HOSPITAL_COMMUNITY): Payer: PRIVATE HEALTH INSURANCE

## 2016-04-20 DIAGNOSIS — N179 Acute kidney failure, unspecified: Secondary | ICD-10-CM | POA: Diagnosis not present

## 2016-04-20 DIAGNOSIS — I480 Paroxysmal atrial fibrillation: Secondary | ICD-10-CM

## 2016-04-20 DIAGNOSIS — I5021 Acute systolic (congestive) heart failure: Secondary | ICD-10-CM | POA: Diagnosis not present

## 2016-04-20 LAB — BASIC METABOLIC PANEL
ANION GAP: 11 (ref 5–15)
BUN: 31 mg/dL — AB (ref 6–20)
CHLORIDE: 104 mmol/L (ref 101–111)
CO2: 27 mmol/L (ref 22–32)
Calcium: 9 mg/dL (ref 8.9–10.3)
Creatinine, Ser: 2.56 mg/dL — ABNORMAL HIGH (ref 0.61–1.24)
GFR calc Af Amer: 30 mL/min — ABNORMAL LOW (ref >60)
GFR calc non Af Amer: 26 mL/min — ABNORMAL LOW (ref >60)
GLUCOSE: 97 mg/dL (ref 65–99)
POTASSIUM: 3.5 mmol/L (ref 3.5–5.1)
Sodium: 142 mmol/L (ref 135–145)

## 2016-04-20 LAB — COOXEMETRY PANEL
Carboxyhemoglobin: 0.6 % (ref 0.5–1.5)
Methemoglobin: 0.9 % (ref 0.0–1.5)
O2 SAT: 57 %
TOTAL HEMOGLOBIN: 14.3 g/dL (ref 12.0–16.0)

## 2016-04-20 LAB — CBC
HEMATOCRIT: 42.1 % (ref 39.0–52.0)
HEMOGLOBIN: 13.8 g/dL (ref 13.0–17.0)
MCH: 29.3 pg (ref 26.0–34.0)
MCHC: 32.8 g/dL (ref 30.0–36.0)
MCV: 89.4 fL (ref 78.0–100.0)
Platelets: 193 10*3/uL (ref 150–400)
RBC: 4.71 MIL/uL (ref 4.22–5.81)
RDW: 14 % (ref 11.5–15.5)
WBC: 5.9 10*3/uL (ref 4.0–10.5)

## 2016-04-20 LAB — HEPARIN LEVEL (UNFRACTIONATED)
HEPARIN UNFRACTIONATED: 0.66 [IU]/mL (ref 0.30–0.70)
HEPARIN UNFRACTIONATED: 0.77 [IU]/mL — AB (ref 0.30–0.70)
Heparin Unfractionated: 0.75 IU/mL — ABNORMAL HIGH (ref 0.30–0.70)

## 2016-04-20 LAB — APTT
aPTT: 70 seconds — ABNORMAL HIGH (ref 24–36)
aPTT: 85 seconds — ABNORMAL HIGH (ref 24–36)

## 2016-04-20 MED ORDER — MILRINONE LACTATE IN DEXTROSE 20-5 MG/100ML-% IV SOLN
0.1250 ug/kg/min | INTRAVENOUS | Status: DC
Start: 2016-04-20 — End: 2016-04-24
  Administered 2016-04-20 – 2016-04-23 (×4): 0.125 ug/kg/min via INTRAVENOUS
  Filled 2016-04-20 (×4): qty 100

## 2016-04-20 MED ORDER — DIGOXIN 125 MCG PO TABS
0.1250 mg | ORAL_TABLET | Freq: Every day | ORAL | Status: DC
  Administered 2016-04-20 – 2016-04-27 (×8): 0.125 mg via ORAL
  Filled 2016-04-20 (×8): qty 1

## 2016-04-20 MED ORDER — TRAZODONE HCL 50 MG PO TABS
50.0000 mg | ORAL_TABLET | Freq: Every evening | ORAL | Status: DC | PRN
  Administered 2016-04-20 – 2016-04-23 (×4): 50 mg via ORAL
  Filled 2016-04-20 (×4): qty 1

## 2016-04-20 MED ORDER — CARVEDILOL 6.25 MG PO TABS
6.2500 mg | ORAL_TABLET | Freq: Two times a day (BID) | ORAL | Status: DC
  Administered 2016-04-20: 6.25 mg via ORAL
  Filled 2016-04-20: qty 1

## 2016-04-20 NOTE — Progress Notes (Addendum)
ANTICOAGULATION CONSULT NOTE - Follow-up Consult  Pharmacy Consult for Heparin Indication: atrial fibrillation  Allergies  Allergen Reactions  . Lisinopril Cough    Patient Measurements: Height: 6' (182.9 cm) Weight: 221 lb 9 oz (100.5 kg) IBW/kg (Calculated) : 77.6 Heparin Dosing Weight: 98 kg  Vital Signs: Temp: 98 F (36.7 C) (03/26 2003) Temp Source: Oral (03/26 2003) BP: 110/82 (03/26 2300) Pulse Rate: 42 (03/26 2300)  Labs:  Recent Labs  04/17/16 0240 04/18/16 0259 04/19/16 0406 04/19/16 2230  APTT  --   --   --  48*  CREATININE 2.21* 2.01* 2.21*  --     Estimated Creatinine Clearance: 43.6 mL/min (A) (by C-G formula based on SCr of 2.21 mg/dL (H)).  Assessment: 49 YOM who presented on 3/23 with SOB, fatigue, and edema concerning for acute on chronic HF exacerbation. The patient was on apixaban PTA for hx Afib - this was held starting on 3/24 for possible cath plans (last dose 3/24 0745). Pt now on heparin bridge. Utilizing aPTTs initially for monitoring as apixaban likely will be affecting heparin level.  PTT 48 sec on gtt at 1400 units/hr - however gtt only started about 3 hours ago so not accurate  Goal of Therapy:  Heparin level 0.3-0.7 units/ml; aPTT 66-102 sec Monitor platelets by anticoagulation protocol: Yes   Plan:  Continue heparin 1400 units/hr F/u a.m. PTT and heparin level (~8 hr post gtt start)  Thank you for allowing pharmacy to be a part of this patient's care.  Sherlon Handing, PharmD, BCPS Clinical pharmacist, pager [phone] 04/20/2016 12:27 AM    Addendum (0430): PTT therapeutic at 70 sec. Heparin level still being affected by apixaban.   Plan: Continue heparin at 1400 units/hr Will f/u 6 hr confirmatory PTT.  Sherlon Handing, PharmD, BCPS Clinical pharmacist, pager [phone] 04/20/2016 4:24 AM

## 2016-04-20 NOTE — Consult Note (Addendum)
Advanced Heart Failure Team Consult Note  Referring Physician: Dr. Burt Knack Primary Physician: [name], MD Primary Cardiologist:  Dr. Johnsie Cancel  Reason for Consultation: Concerns for low output HF.   HPI:    [name] is a 61 y.o. male is with systolic CHF diagnosed in 02/2016 and Afib.  Seen in ED twice in last month and had DCCV both times for Afib RVR. Only held NSR for several days each time.    Pt presented to [facility] 04/16/16 with acutely worsening fatigue, SOB, and LE edema.  + PND and orthopnea.  Pertinent labs on admission include K 3.6, Cr 2.1, BNP 1200, troponin x 1 negative. Has been getting lasix IV BID x 3 days. Creatinine has continued to rise. Has diuresed 6 L and down 17 lbs from admission.   Echo 03/17/16 LVEF 25-30%, No "smoke" or LV thombus noted.   Echo 04/17/16 LVEF 10-15% (although listed as 20-25% in one section), Heavy smoke in LV concerning for development of LV thrombus, Normal RV, mild/mod MR, LAE mod/severe   PICC line placed 04/19/16 for Coox and CVP. Pt also started on IV amiodarone for better rate control with HRs in 100-110s. Started on IV heparin as well.   Pt states he was in usual state of health up until January. Had been working in Biomedical [employer].  Denies any acute illness leading up to his decompensation. States Afib was noted at a regular follow up. Has had worsening fatigue and DOE.  On some days is SOB getting dressed and changing clothes. Has lightheadedness with rapid standing and borderline near-syncope. + tachy palpitations when rates are > 120. States he has been compliant with all of his medications. Never smoker. Denies ETOH.   He appears fatigued. Rhythm currently looks like sinus tach vs artial flutter on amio. Feeling a bit better but still weak. Winded with any exertion. No angina.   Review of Systems: [y] = yes, [ ]  = no  y General: Weight gain [ ] ; Weight loss [ ] ; Anorexia [ ] ; Fatigue [ ] ; Fever [ ] ; Chills [ ] ; Weakness Blue.Reese ]  Cardiac:  Chest pain/pressure [ ] ; Resting SOB [ ] ; Exertional SOB [y]; Orthopnea [ y]; Pedal Edema [y]; Palpitations [y]; Syncope [ ] ; Presyncope [ ] ; Paroxysmal nocturnal dyspnea[ ]   Pulmonary: Cough [y]; Wheezing[ ] ; Hemoptysis[ ] ; Sputum [ ] ; Snoring Blue.Reese ]  GI: Vomiting[ ] ; Dysphagia[ ] ; Melena[ ] ; Hematochezia [ ] ; Heartburn[ ] ; Abdominal pain [ ] ; Constipation [ ] ; Diarrhea [ ] ; BRBPR [ ]   GU: Hematuria[ ] ; Dysuria [ ] ; Nocturia[ ]   Vascular: Pain in legs with walking [ ] ; Pain in feet with lying flat [ ] ; Non-healing sores [ ] ; Stroke [ ] ; TIA [ ] ; Slurred speech [ ] ;  Neuro: Headaches[ ] ; Vertigo[ ] ; Seizures[ ] ; Paresthesias[ ] ;Blurred vision [ ] ; Diplopia [ ] ; Vision changes [ ]   Ortho/Skin: Arthritis [y]; Joint pain [y]; Muscle pain [ ] ; Joint swelling [ ] ; Back Pain [ ] ; Rash [ ]   Psych: Depression[ ] ; Anxiety[ ]   Heme: Bleeding problems [ ] ; Clotting disorders [ ] ; Anemia [ ]   Endocrine: Diabetes [ ] ; Thyroid dysfunction[ ]   Home Medications Prior to Admission medications   Medication Sig Start Date End Date Taking? Authorizing Provider  albuterol (PROVENTIL HFA;VENTOLIN HFA) 108 (90 Base) MCG/ACT inhaler Inhale 2 puffs into the lungs every 6 (six) hours as needed for wheezing or shortness of breath.   Yes Historical Provider, MD  amiodarone (PACERONE) 200  MG tablet Take 2 tablets (400 mg total) by mouth 2 (two) times daily. 04/09/16  Yes Sherran Needs, NP  apixaban (ELIQUIS) 5 MG TABS tablet Take 1 tablet (5 mg total) by mouth 2 (two) times daily. 04/09/16  Yes Sherran Needs, NP  carvedilol (COREG) 12.5 MG tablet Take 1 tablet (12.5 mg total) by mouth 2 (two) times daily. 04/05/16  Yes Sherran Needs, NP  fluticasone furoate-vilanterol (BREO ELLIPTA) 100-25 MCG/INH AEPB Inhale 1 puff into the lungs daily.   Yes Historical Provider, MD  furosemide (LASIX) 20 MG tablet Take 1 tablet (20 mg total) by mouth every Monday, Wednesday, and Friday. 04/02/16 07/01/16 Yes Sherran Needs, NP  irbesartan  (AVAPRO) 300 MG tablet Take 300 mg by mouth daily.   Yes Historical Provider, MD  pantoprazole (PROTONIX) 40 MG tablet Take 40 mg by mouth daily.   Yes Historical Provider, MD    Past Medical History: Past Medical History:  Diagnosis Date  . Asthma, persistent   . BPH (benign prostatic hyperplasia) 2014  . Combined hyperlipidemia   . GERD (gastroesophageal reflux disease)   . Hypertension   . 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 Obesity   . Renal stone 04/2012  . Seasonal allergic rhinitis     Past Surgical History: History reviewed. No pertinent surgical history.  Family History: No family history on file.  Social History: Social History   Social History  . Marital status: Married    Spouse name: N/A  . Number of children: N/A  . Years of education: N/A   Occupational History  . 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 Smoking status: Never Smoker  . Smokeless tobacco: Never Used  . Alcohol use No  . Drug use: No  . Sexual activity: Not Asked   Other Topics Concern  . None   Social History Narrative  . None    Allergies:  Allergies  Allergen Reactions  . Lisinopril Cough    Objective:    Vital Signs:   Temp:  [97 F (36.1 C)-98.1 F (36.7 C)] 97.5 F (36.4 C) (03/27 1100) Pulse Rate:  [35-117] 89 (03/27 0845) Resp:  [9-27] 20 (03/27 0845) BP: (76-126)/(50-103) 104/89 (03/27 0845) SpO2:  [92 %-99 %] 94 % (03/27 0845) Weight:  [221 lb 5.5 oz (100.4 kg)] 221 lb 5.5 oz (100.4 kg) (03/27 0845) Last BM Date: 04/20/16  Weight change: Filed Weights   04/18/16 0417 04/19/16 0500 04/20/16 0845  Weight: 230 lb 13.2 oz (104.7 kg) 221 lb 9 oz (100.5 kg) 221 lb 5.5 oz (100.4 kg)    Intake/Output:   Intake/Output Summary (Last 24 hours) at 04/20/16 1447 Last data filed at 04/20/16 1432  Gross per 24 hour  Intake          2067.48 ml  Output              750 ml  Net          1317.48 ml     Physical Exam: General:  Fatigued  appearing. NAD. Able to talk in full sentences HEENT: Normal. Anicteric  Neck: supple. JVP 6-7cm . Carotids 2+ bilat; no bruits. No lymphadenopathy or thyromegaly appreciated. Cor: PMI nondisplaced. Regular Tachy. Probable s3.  Lungs: Diminished basilar sounds. Mild basilar crckles. No wheeze Abdomen: soft, NT, ND, no HSM. No bruits or masses. +BS  Extremities: no cyanosis, clubbing, rash. 1+ edema. Cool to the touch.  Neuro: alert & orientedx3, cranial  nerves grossly intact. moves all 4 extremities w/o difficulty. Affect flat but appropriate. No asterixis  Telemetry: Sinus tach vs atrial flutter  100-110 Personally reviewed   Labs: Basic Metabolic Panel:  Recent Labs Lab 04/15/16 2309 04/17/16 0240 04/18/16 0259 04/19/16 0406 04/20/16 0306  NA 141 143 143 142 142  K 3.6 3.0* 2.9* 3.5 3.5  CL 109 105 103 104 104  CO2 22 27 27 26 27   GLUCOSE 108* 94 99 94 97  BUN 31* 29* 26* 26* 31*  CREATININE 2.10* 2.21* 2.01* 2.21* 2.56*  CALCIUM 8.8* 8.9 9.1 9.0 9.0  MG  --   --  2.0  --   --     Liver Function Tests: No results for input(s): AST, ALT, ALKPHOS, BILITOT, PROT, ALBUMIN in the last 168 hours. No results for input(s): LIPASE, AMYLASE in the last 168 hours. No results for input(s): AMMONIA in the last 168 hours.  CBC:  Recent Labs Lab 04/15/16 2309 04/20/16 0306  WBC 6.5 5.9  HGB 13.5 13.8  HCT 40.6 42.1  MCV 88.8 89.4  PLT 196 193    Cardiac Enzymes: No results for input(s): CKTOTAL, CKMB, CKMBINDEX, TROPONINI in the last 168 hours.  BNP: BNP (last 3 results)  Recent Labs  04/16/16 0200  BNP 1,201.3*    ProBNP (last 3 results)  Recent Labs  03/30/16 1039  PROBNP 3,730*     CBG: No results for input(s): GLUCAP in the last 168 hours.  Coagulation Studies: No results for input(s): LABPROT, INR in the last 72 hours.  Other results: EKG: 04/20/16 sinus tach vs AFL 110 non specific t wave abnormality Personally reviewed   Imaging: Dg Chest  Port 1 View  Result Date: 04/19/2016 CLINICAL DATA:  Right PICC line EXAM: PORTABLE CHEST 1 VIEW COMPARISON:  04/15/2016 FINDINGS: 1836 hours. Basilar atelectasis noted. The cardio pericardial silhouette is enlarged. Right PICC line tip is positioned over the level of the mid SVC. The visualized bony structures of the thorax are intact. Telemetry leads overlie the chest. IMPRESSION: Right PICC line tip overlies the mid SVC level. Electronically Signed   By: Misty Stanley M.D.   On: 04/19/2016 18:50      Medications:     Current Medications: . fluticasone furoate-vilanterol  1 puff Inhalation Daily  . pantoprazole  40 mg Oral Daily  . sodium chloride flush  10-40 mL Intracatheter Q12H  . sodium chloride flush  3 mL Intravenous Q12H     Infusions: . amiodarone 30 mg/hr (04/19/16 2145)  . heparin 1,400 Units/hr (04/19/16 2005)      Assessment/Plan   1. Acute on chronic systolic CHF 2. Afib vs AFL with RVR 3. Possible LV thrombus 4. 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 Hypotension 6. Snoring - Has sleep study schedule for April 2018  Pt with LVEF 10-15% in setting of poorly controlled Afib.  Pt has never had ischemic work up. Has not had any overt ACS symptoms. 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   Volume status looks OK. CVP 5-6.  NYHA IIIb symptoms.   Pt has component of low output with coox 57%. Hesitant to use milrinone with tachyarrhythmia, but suspect he will need at least short term for optimization.   Pressures soft. Stop BB with concerns for low output. No afterload reduction for now with acute decompensation and hypotension. No ACE/ARB specifically with AKI.   Continue amiodarone gtt for now.  Will need to consider for DCCV. May need TEE with concerns for DCCV.  Length of  Stay: [address]  Annamaria Helling  04/20/2016, 2:47 PM  Advanced Heart Failure Team Pager [phone] (M-F; 7a - 4p)  Please contact [facility] for night-coverage after hours (4p -7a ) and weekends on  [email or web address]  Patient seen and examined with Oda Kilts, PA-C. We discussed all aspects of the encounter. I agree with the assessment and plan as stated above.   Consult requested by Dr. Burt Knack. Echo images, ECG, CXR and previous notes all viewed personally.   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 EF 10-15%. He has failed DC-CV prior to amiodarone load. Now appears to be in sinus tach vs AFL.(I suspect possible AFL). Co-ox marginal. He is tentatively planned for DC-CV tomorrow but I think he needs to be stabilized further first. Will start milrinone and digoxin. Continue gentle diuresis. If rhythm remains unclear can give test dose of ivabradine to see if he slows (sinus will slow. AFL shouldn't).    The AHF team will continue to follow.   Glori Bickers, MD  10:34 PM

## 2016-04-20 NOTE — Progress Notes (Signed)
Progress Note  Patient Name: [name] Date of Encounter: 04/20/2016  Primary Cardiologist: Johnsie Cancel  Subjective   The patient denies dizziness. He denies chest pain or shortness of breath at rest.  Inpatient Medications    Scheduled Meds: . carvedilol  12.5 mg Oral BID  . fluticasone furoate-vilanterol  1 puff Inhalation Daily  . furosemide  80 mg Intravenous BID  . pantoprazole  40 mg Oral Daily  . sodium chloride flush  10-40 mL Intracatheter Q12H  . sodium chloride flush  3 mL Intravenous Q12H   Continuous Infusions: . amiodarone 30 mg/hr (04/19/16 2145)  . heparin 1,400 Units/hr (04/19/16 2005)   PRN Meds: sodium chloride, acetaminophen, loperamide, ondansetron (ZOFRAN) IV, sodium chloride flush, sodium chloride flush   Vital Signs    Vitals:   04/20/16 0300 04/20/16 0400 04/20/16 0500 04/20/16 0700  BP: 98/71 94/80 91/71  103/81  Pulse: 95 (!) 102 (!) 117 (!) 107  Resp: 17 (!) 21 (!) 27 20  Temp:    98.1 F (36.7 C)  TempSrc:    Oral  SpO2: 95% 94% 99% 96%  Weight:      Height:        Intake/Output Summary (Last 24 hours) at 04/20/16 0746 Last data filed at 04/20/16 0700  Gross per 24 hour  Intake           1163.3 ml  Output             1625 ml  Net           -461.7 ml   Filed Weights   04/17/16 0500 04/18/16 0417 04/19/16 0500  Weight: 238 lb 12.1 oz (108.3 kg) 230 lb 13.2 oz (104.7 kg) 221 lb 9 oz (100.5 kg)    Telemetry    Atrial fibrillation, average heart rate 95-100 bpm - Personally Reviewed   Physical Exam  Alert and oriented, pleasant male GEN: No acute distress.   Neck:  JVP 10 cm Cardiac:  irregularly irregular, no murmurs, rubs, or gallops.  Respiratory: Clear to auscultation bilaterally. GI: Soft, nontender, non-distended  MS:  1+ bilateral pedal edema; No deformity. Neuro:  Nonfocal  Psych: Normal affect   Labs    Chemistry Recent Labs Lab 04/18/16 0259 04/19/16 0406 04/20/16 0306  NA 143 142 142  K 2.9* 3.5 3.5    CL 103 104 104  CO2 27 26 27   GLUCOSE 99 94 97  BUN 26* 26* 31*  CREATININE 2.01* 2.21* 2.56*  CALCIUM 9.1 9.0 9.0  GFRNONAA 34* 31* 26*  GFRAA 40* 35* 30*  ANIONGAP 13 12 11      Hematology Recent Labs Lab 04/15/16 2309 04/20/16 0306  WBC 6.5 5.9  RBC 4.57 4.71  HGB 13.5 13.8  HCT 40.6 42.1  MCV 88.8 89.4  MCH 29.5 29.3  MCHC 33.3 32.8  RDW 13.9 14.0  PLT 196 193    Cardiac EnzymesNo results for input(s): TROPONINI in the last 168 hours.  Recent Labs Lab 04/15/16 2322  TROPIPOC 0.04     BNP Recent Labs Lab 04/16/16 0200  BNP 1,201.3*     DDimer No results for input(s): DDIMER in the last 168 hours.   Radiology    Dg Chest Port 1 View  Result Date: 04/19/2016 CLINICAL DATA:  Right PICC line EXAM: PORTABLE CHEST 1 VIEW COMPARISON:  04/15/2016 FINDINGS: 1836 hours. Basilar atelectasis noted. The cardio pericardial silhouette is enlarged. Right PICC line tip is positioned over the level of the mid SVC.  The visualized bony structures of the thorax are intact. Telemetry leads overlie the chest. IMPRESSION: Right PICC line tip overlies the mid SVC level. Electronically Signed   By: Misty Stanley M.D.   On: 04/19/2016 18:50    Cardiac Studies   Transthoracic Echocardiography 04/17/2016  Study Conclusions  - Left ventricle: The cavity size was severely dilated. There was mild concentric hypertrophy. Systolic function was severely reduced. The estimated ejection fraction was in the range of 10-15%. Diffuse hypokinesis. The study is not technically sufficient to allow evaluation of LV diastolic function. - Aortic valve: There was no regurgitation. - Aortic root: The aortic root was normal in size. - Ascending aorta: The ascending aorta was normal in size. - Mitral valve: There was mild to moderate regurgitation directed centrally. - Left atrium: The atrium was moderately to severely dilated. - Right ventricle: Systolic function was normal. -  Right atrium: The atrium was moderately dilated. - Tricuspid valve: There was mild regurgitation. - Pulmonary arteries: Systolic pressure was within the normal range. - Inferior vena cava: The vessel was normal in size. - Pericardium, extracardiac: There was no pericardial effusion.  Impressions:  - Compared to the prior study from 03/17/2016 LVEF has decreased from 25-30% to 10-15%. There is heavy smoke in the LV apex seen on contrast images consistent with pre-thrombotic state. No organized thrombus was seen. An anticoagulation is recommended.  Patient Profile     61 y.o. male acute on chronic systolic heart failure, atrial fibrillation with RVR difficult to control, and severe LV dysfunction with LVEF 10-15%  Assessment & Plan    1. 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 Need to hold diuretics today with worsening renal function. Measure CVP and co-ox this morning now that PICC line is placed. Reduce carvedilol to 6.25 mg twice daily with hold parameters for blood pressure. Consider consultation with the advanced heart failure team pending results of his co-ox.   2. Atrial fibrillation with RVR: Heart rate is better controlled on amiodarone. Anticoagulated with heparin. Consider TEE cardioversion in the next 24-48 hours depending on his clinical course.  3. AKI: Worsening renal function today. Suspect hypoperfusion as primary issue.  Hold diuretics and reduce carvedilol with hold parameters for blood pressure.  Further plans pending co-ox and CVP results. May need inotropic support.   Deatra James, MD  04/20/2016, 7:46 AM

## 2016-04-20 NOTE — Progress Notes (Signed)
ANTICOAGULATION CONSULT NOTE - Follow-up Consult  Pharmacy Consult for Heparin Indication: atrial fibrillation  Patient Measurements: Height: 6' (182.9 cm) Weight: 221 lb 5.5 oz (100.4 kg) IBW/kg (Calculated) : 77.6 Heparin Dosing Weight: 98 kg  Vital Signs: Temp: 97.5 F (36.4 C) (03/27 1100) Temp Source: Oral (03/27 1100) BP: 104/89 (03/27 0845) Pulse Rate: 89 (03/27 0845)  Labs:  Recent Labs  04/18/16 0259 04/19/16 0406 04/19/16 2230 04/20/16 0306 04/20/16 1115  HGB  --   --   --  13.8  --   HCT  --   --   --  42.1  --   PLT  --   --   --  193  --   APTT  --   --  48* 70* 85*  HEPARINUNFRC  --   --  0.66 0.75* 0.77*  CREATININE 2.01* 2.21*  --  2.56*  --     Estimated Creatinine Clearance: 37.6 mL/min (A) (by C-G formula based on SCr of 2.56 mg/dL (H)).  Assessment: 3 YOM who presented on 3/23 with SOB, fatigue, and edema concerning for acute on chronic HF exacerbation. The patient was on apixaban PTA for hx Afib - this was held starting on 3/24 for possible cath plans (last dose 3/24 0745). Pt now on heparin bridge. Utilizing aPTTs initially for monitoring as apixaban likely will be affecting heparin level.  aPTT this morning remains therapeutic (aPTT 85 << 70). Heparin level remains falsely elevated. CBC stable- no bleeding noted.   Goal of Therapy:  Heparin level 0.3-0.7 units/ml; aPTT 66-102 sec Monitor platelets by anticoagulation protocol: Yes   Plan:  1. Continue heparin at 1400 units/hr 2. Will continue to monitor for any signs/symptoms of bleeding and will follow up with aPTT and heparin level in the a.m.   Thank you for allowing pharmacy to be a part of this patient's care.  Alycia Rossetti, PharmD, BCPS Clinical Pharmacist Pager: [phone] Clinical phone for 04/20/2016 from 7a-3:30p: [phone] If after 3:30p, please call main pharmacy at: [phone] 04/20/2016 1:02 PM

## 2016-04-21 ENCOUNTER — Encounter (HOSPITAL_COMMUNITY): Payer: Self-pay | Admitting: Anesthesiology

## 2016-04-21 ENCOUNTER — Other Ambulatory Visit (HOSPITAL_COMMUNITY): Payer: PRIVATE HEALTH INSURANCE

## 2016-04-21 ENCOUNTER — Encounter (HOSPITAL_COMMUNITY)
Admission: EM | Disposition: A | Discharge status: Home / Self Care | Payer: Self-pay | Source: Home / Self Care | Attending: Cardiovascular Disease

## 2016-04-21 DIAGNOSIS — I480 Paroxysmal atrial fibrillation: Secondary | ICD-10-CM | POA: Diagnosis not present

## 2016-04-21 DIAGNOSIS — N179 Acute kidney failure, unspecified: Secondary | ICD-10-CM | POA: Diagnosis not present

## 2016-04-21 DIAGNOSIS — I5021 Acute systolic (congestive) heart failure: Secondary | ICD-10-CM | POA: Diagnosis not present

## 2016-04-21 LAB — CBC
HCT: 45 % (ref 39.0–52.0)
HEMOGLOBIN: 14.8 g/dL (ref 13.0–17.0)
MCH: 29.2 pg (ref 26.0–34.0)
MCHC: 32.9 g/dL (ref 30.0–36.0)
MCV: 88.9 fL (ref 78.0–100.0)
PLATELETS: 209 10*3/uL (ref 150–400)
RBC: 5.06 MIL/uL (ref 4.22–5.81)
RDW: 13.9 % (ref 11.5–15.5)
WBC: 6.7 10*3/uL (ref 4.0–10.5)

## 2016-04-21 LAB — COOXEMETRY PANEL
Carboxyhemoglobin: 0.7 % (ref 0.5–1.5)
Methemoglobin: 0.8 % (ref 0.0–1.5)
O2 Saturation: 69.9 %
TOTAL HEMOGLOBIN: 13.9 g/dL (ref 12.0–16.0)

## 2016-04-21 LAB — BASIC METABOLIC PANEL
Anion gap: 10 (ref 5–15)
BUN: 32 mg/dL — ABNORMAL HIGH (ref 6–20)
CHLORIDE: 105 mmol/L (ref 101–111)
CO2: 26 mmol/L (ref 22–32)
CREATININE: 2.43 mg/dL — AB (ref 0.61–1.24)
Calcium: 9.1 mg/dL (ref 8.9–10.3)
GFR, EST AFRICAN AMERICAN: 32 mL/min — AB (ref >60)
GFR, EST NON AFRICAN AMERICAN: 27 mL/min — AB (ref >60)
Glucose, Bld: 93 mg/dL (ref 65–99)
Potassium: 3.2 mmol/L — ABNORMAL LOW (ref 3.5–5.1)
SODIUM: 141 mmol/L (ref 135–145)

## 2016-04-21 LAB — APTT: APTT: 139 s — AB (ref 24–36)

## 2016-04-21 LAB — MAGNESIUM: MAGNESIUM: 2 mg/dL (ref 1.7–2.4)

## 2016-04-21 LAB — HEPARIN LEVEL (UNFRACTIONATED): HEPARIN UNFRACTIONATED: 1.04 [IU]/mL — AB (ref 0.30–0.70)

## 2016-04-21 SURGERY — ECHOCARDIOGRAM, TRANSESOPHAGEAL
Anesthesia: Monitor Anesthesia Care

## 2016-04-21 MED ORDER — APIXABAN 5 MG PO TABS
5.0000 mg | ORAL_TABLET | Freq: Two times a day (BID) | ORAL | Status: DC
  Administered 2016-04-21 – 2016-04-27 (×13): 5 mg via ORAL
  Filled 2016-04-21 (×13): qty 1

## 2016-04-21 MED ORDER — AMIODARONE HCL IN DEXTROSE 360-4.14 MG/200ML-% IV SOLN
30.0000 mg/h | INTRAVENOUS | Status: DC
  Administered 2016-04-21 – 2016-04-23 (×4): 30 mg/h via INTRAVENOUS
  Filled 2016-04-21 (×4): qty 200

## 2016-04-21 MED ORDER — POTASSIUM CHLORIDE CRYS ER 20 MEQ PO TBCR
20.0000 meq | EXTENDED_RELEASE_TABLET | Freq: Two times a day (BID) | ORAL | Status: DC
  Administered 2016-04-21 – 2016-04-27 (×13): 20 meq via ORAL
  Filled 2016-04-21 (×13): qty 1

## 2016-04-21 NOTE — Progress Notes (Signed)
Davis Gourd, PA called to cancel case due to patient condition.

## 2016-04-21 NOTE — Progress Notes (Signed)
ANTICOAGULATION CONSULT NOTE - Follow Up Consult  Pharmacy Consult for heparin Indication: atrial fibrillation  Labs:  Recent Labs  04/19/16 0406  04/20/16 0306 04/20/16 1115 04/21/16 0351  HGB  --   --  13.8  --  14.8  HCT  --   --  42.1  --  45.0  PLT  --   --  193  --  209  APTT  --   < > 70* 85* 139*  HEPARINUNFRC  --   < > 0.75* 0.77* 1.04*  CREATININE 2.21*  --  2.56*  --  2.43*  < > = values in this interval not displayed.   Assessment: 61yo male now above goal on heparin after two PTTs at goal; had been trending up; lab drawn correctly.  Goal of Therapy:  aPTT 66-102 seconds   Plan:  Will decrease heparin gtt by 3 units/kg/hr to 1100 units/hr and check PTT in 8hr.  Wynona Neat, PharmD, BCPS  04/21/2016,6:24 AM

## 2016-04-21 NOTE — Progress Notes (Signed)
Pt's potassium 3.2.  Text paged Dr.Croitoru at this time since pt is going for tee around 0900.  Reported this to endo nurse to received report.

## 2016-04-21 NOTE — Progress Notes (Signed)
Advanced Heart Failure Rounding Note  PCP: [name], MD Primary Cardiologist: Dr. Johnsie Cancel  Subjective:    Admitted with rapid afib, EF found to be 10-15%. Has had 2 episodes of rapid Afib in the past month both times cardioverted in the ED and only held NSR for a few days each time. Started on Amio gtt on 04/19/16, milrinone started yesterday.   Co ox 57->69.9.   Weight down 16 pounds since admission. Creatinine 2.21->2.01->2.21->2.56->2.43.   Feels good this morning, denies palpitations, does not feel SOB at rest. No on milrinone. Feeling better. Co-ox up. CVP remains low 2-3 range off diuretics. AF rate 110-120 on amio   Objective:   Weight Range: 224 lb 6.9 oz (101.8 kg) Body mass index is 30.44 kg/m.   Vital Signs:   Temp:  [97.4 F (36.3 C)-97.7 F (36.5 C)] 97.7 F (36.5 C) (03/28 0400) Pulse Rate:  [50-127] 106 (03/28 0600) Resp:  [0-27] 18 (03/28 0600) BP: (84-127)/(58-105) 104/89 (03/28 0600) SpO2:  [90 %-98 %] 92 % (03/28 0600) Weight:  [221 lb 5.5 oz (100.4 kg)-224 lb 6.9 oz (101.8 kg)] 224 lb 6.9 oz (101.8 kg) (03/28 0500) Last BM Date: 04/20/16  Weight change: Filed Weights   04/19/16 0500 04/20/16 0845 04/21/16 0500  Weight: 221 lb 9 oz (100.5 kg) 221 lb 5.5 oz (100.4 kg) 224 lb 6.9 oz (101.8 kg)    Intake/Output:   Intake/Output Summary (Last 24 hours) at 04/21/16 0804 Last data filed at 04/21/16 0600  Gross per 24 hour  Intake          2319.76 ml  Output              600 ml  Net          1719.76 ml     Physical Exam: General:  Well appearing male in NAD, sitting up in bed.  HEENT: normal, atraumatic. anicteric Neck: supple. No JVP. Carotids 2+ bilat; no bruits. No lymphadenopathy or thyromegaly appreciated. Cor: PMI nondisplaced. IRR tachy. No murmur Lungs: Diminished in bases. Otherwise clear. No wheeze Abdomen: obese soft, nontender, nondistended. No hepatosplenomegaly. No bruits or masses. Good BS   Extremities: no cyanosis, clubbing,  rash. No edema. Warm Neuro: alert & orientedx3, cranial nerves grossly intact. moves all 4 extremities w/o difficulty. Affect pleasant   Telemetry: AF rates 110-120 Personally reviewed   Labs: CBC  Recent Labs  04/20/16 0306 04/21/16 0351  WBC 5.9 6.7  HGB 13.8 14.8  HCT 42.1 45.0  MCV 89.4 88.9  PLT 193 628   Basic Metabolic Panel  Recent Labs  04/20/16 0306 04/21/16 0351  NA 142 141  K 3.5 3.2*  CL 104 105  CO2 27 26  GLUCOSE 97 93  BUN 31* 32*  CREATININE 2.56* 2.43*  CALCIUM 9.0 9.1  MG  --  2.0    BNP: BNP (last 3 results)  Recent Labs  04/16/16 0200  BNP 1,201.3*    ProBNP (last 3 results)  Recent Labs  03/30/16 1039  PROBNP 3,730*   Imaging/Studies: Transthoracic Echocardiography 04/17/16  Study Conclusions  - Left ventricle: The cavity size was severely dilated. There was   mild concentric hypertrophy. Systolic function was severely   reduced. The estimated ejection fraction was in the range of   10-15%. Diffuse hypokinesis. The study is not technically   sufficient to allow evaluation of LV diastolic function. - Aortic valve: There was no regurgitation. - Aortic root: The aortic root was normal in  size. - Ascending aorta: The ascending aorta was normal in size. - Mitral valve: There was mild to moderate regurgitation directed   centrally. - Left atrium: The atrium was moderately to severely dilated. - Right ventricle: Systolic function was normal. - Right atrium: The atrium was moderately dilated. - Tricuspid valve: There was mild regurgitation. - Pulmonary arteries: Systolic pressure was within the normal   range. - Inferior vena cava: The vessel was normal in size. - Pericardium, extracardiac: There was no pericardial effusion.  Impressions:  - Compared to the prior study from 03/17/2016 LVEF has decreased   from 25-30% to 10-15%.   There is heavy smoke in the LV apex seen on contrast images   consistent with pre-thrombotic  state. No organized thrombus was   seen.   An anticoagulation is recommended.    Medications:     Scheduled Medications: . digoxin  0.125 mg Oral Daily  . fluticasone furoate-vilanterol  1 puff Inhalation Daily  . pantoprazole  40 mg Oral Daily  . sodium chloride flush  10-40 mL Intracatheter Q12H     Infusions: . amiodarone 30 mg/hr (04/21/16 9030)  . heparin 1,100 Units/hr (04/21/16 0923)  . milrinone 0.125 mcg/kg/min (04/21/16 0600)     PRN Medications:  sodium chloride, acetaminophen, loperamide, ondansetron (ZOFRAN) IV, sodium chloride flush, traZODone   Assessment/Plan   1. Acute combined systolic and diastolic CHF, NYHA III 2. NICM  3. Atrial fibrillation with RVR 4. LV thrombus 5. Questionable sleep apnea 6. Acute renal insufficiency  7. 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 He has not had an ischemic work up, denies ACS symptoms. Volume stable, weight down 14 pounds total.   Continues to be in rapid Afib this am, Amio gtt has improved rates. No DC-CV today, needs to be further stabilized. Amio po started on 04/09/16 by the Afib clinic, has gotten about 8gm total Amio so far.  Left atrial size is 54 mm.   No beta blocker while he is decompensated. Avoiding ACE/ARB due to renal insufficiency. Potassium replaced, mg is 2.0   Length of Stay: [place], NP  04/21/2016, 8:04 AM  Advanced Heart Failure Team Pager [phone] (M-F; 7a - 4p)  Please contact [facility] for night-coverage after hours (4p -7a ) and weekends on [email or web address]  Patient seen and examined with Jettie Booze, NP. We discussed all aspects of the encounter. I agree with the assessment and plan as stated above.   More stable today on milrinone and IV amio. Co-ox improved. AF rate down. CVP 2-3. Remains on heparin.   Creatinine improving slowly. (baseline 1.5)   Will plan TEE and DC-CV on Friday. Switch heparin to apixaban today. Discussed  with PharmD.   Hold off on diuretics for now.  Glori Bickers, MD  3:02 PM

## 2016-04-22 DIAGNOSIS — I480 Paroxysmal atrial fibrillation: Secondary | ICD-10-CM | POA: Diagnosis not present

## 2016-04-22 DIAGNOSIS — N179 Acute kidney failure, unspecified: Secondary | ICD-10-CM | POA: Diagnosis not present

## 2016-04-22 DIAGNOSIS — I5021 Acute systolic (congestive) heart failure: Secondary | ICD-10-CM | POA: Diagnosis not present

## 2016-04-22 LAB — CBC
HCT: 39.2 % (ref 39.0–52.0)
Hemoglobin: 12.8 g/dL — ABNORMAL LOW (ref 13.0–17.0)
MCH: 28.8 pg (ref 26.0–34.0)
MCHC: 32.7 g/dL (ref 30.0–36.0)
MCV: 88.1 fL (ref 78.0–100.0)
PLATELETS: 162 10*3/uL (ref 150–400)
RBC: 4.45 MIL/uL (ref 4.22–5.81)
RDW: 13.5 % (ref 11.5–15.5)
WBC: 4.3 10*3/uL (ref 4.0–10.5)

## 2016-04-22 LAB — BASIC METABOLIC PANEL
Anion gap: 11 (ref 5–15)
BUN: 30 mg/dL — AB (ref 6–20)
CALCIUM: 8.9 mg/dL (ref 8.9–10.3)
CO2: 27 mmol/L (ref 22–32)
CREATININE: 2.29 mg/dL — AB (ref 0.61–1.24)
Chloride: 106 mmol/L (ref 101–111)
GFR, EST AFRICAN AMERICAN: 34 mL/min — AB (ref >60)
GFR, EST NON AFRICAN AMERICAN: 29 mL/min — AB (ref >60)
Glucose, Bld: 92 mg/dL (ref 65–99)
Potassium: 3.3 mmol/L — ABNORMAL LOW (ref 3.5–5.1)
SODIUM: 144 mmol/L (ref 135–145)

## 2016-04-22 LAB — COOXEMETRY PANEL
Carboxyhemoglobin: 0.7 % (ref 0.5–1.5)
Methemoglobin: 1 % (ref 0.0–1.5)
O2 Saturation: 83.2 %
TOTAL HEMOGLOBIN: 11.7 g/dL — AB (ref 12.0–16.0)

## 2016-04-22 NOTE — Progress Notes (Signed)
Took consent into patient's room. Patient wants to talk with MD before signing consent. He reported that he wants a better understanding of what will happen. Will notify MD.

## 2016-04-22 NOTE — Progress Notes (Signed)
Advanced Heart Failure Rounding Note  PCP: [name], MD Primary Cardiologist: Dr. Johnsie Cancel  Subjective:    Admitted with rapid afib, EF found to be 10-15%. Has had 2 episodes of rapid Afib in the past month both times cardioverted in the ED and only held NSR for a few days each time. Started on Amio gtt on 04/19/16, milrinone started 04/21/16  Co ox 57->69.9-> 83%  Only 560 ml out in the past 24 hours, CVP is 6 this am. Weight inaccurate today.   Creatinine 2.21->2.01->2.21->2.56->2.43->2.29  Feels well today, denies SOB. Has gotten up and walked in his room.   Remains in AF. Rate improving but still elevated. Feels better on milrinone. Co-ox up. Creatinine improving. CVP 6 off diuretics. Heparin switched to apixaban   Objective:   Weight Range: 231 lb 0.7 oz (104.8 kg) Body mass index is 31.33 kg/m.   Vital Signs:   Temp:  [97.4 F (36.3 C)-97.9 F (36.6 C)] 97.8 F (36.6 C) (03/29 0500) Pulse Rate:  [51-126] 103 (03/29 0500) Resp:  [10-34] 17 (03/29 0500) BP: (86-143)/(66-117) 109/84 (03/29 0500) SpO2:  [93 %-97 %] 96 % (03/29 0500) Weight:  [231 lb 0.7 oz (104.8 kg)] 231 lb 0.7 oz (104.8 kg) (03/29 0500) Last BM Date: 04/20/16  Weight change: Filed Weights   04/20/16 0845 04/21/16 0500 04/22/16 0500  Weight: 221 lb 5.5 oz (100.4 kg) 224 lb 6.9 oz (101.8 kg) 231 lb 0.7 oz (104.8 kg)    Intake/Output:   Intake/Output Summary (Last 24 hours) at 04/22/16 0744 Last data filed at 04/22/16 0700  Gross per 24 hour  Intake          1270.15 ml  Output              560 ml  Net           710.15 ml     Physical Exam: General:  Male, in NAD. Sitting up in bed.  HEENT: Normal. Anicteric   Neck: supple. JVP 6 . Carotids 2+ bilat; no bruits. No lymphadenopathy or thyromegaly appreciated. Cor: PMI nondisplaced. IRR tach. + s3 Lungs: CTAB, normal effort.  Abdomen: Obese. Soft, nontender, nondistended. No hepatosplenomegaly. No bruits or masses. + bowel sounds.    Extremities: no cyanosis, clubbing, rash. Warm no edema  Neuro: alert & orientedx3, cranial nerves grossly intact. moves all 4 extremities w/o difficulty. Affect pleasant   Telemetry: Atrial fib, rates in 100-120. - Personally reviewed .    Labs: CBC  Recent Labs  04/21/16 0351 04/22/16 0415  WBC 6.7 4.3  HGB 14.8 12.8*  HCT 45.0 39.2  MCV 88.9 88.1  PLT 209 027   Basic Metabolic Panel  Recent Labs  04/21/16 0351 04/22/16 0415  NA 141 144  K 3.2* 3.3*  CL 105 106  CO2 26 27  GLUCOSE 93 92  BUN 32* 30*  CREATININE 2.43* 2.29*  CALCIUM 9.1 8.9  MG 2.0  --     BNP: BNP (last 3 results)  Recent Labs  04/16/16 0200  BNP 1,201.3*    ProBNP (last 3 results)  Recent Labs  03/30/16 1039  PROBNP 3,730*   Imaging/Studies: Transthoracic Echocardiography 04/17/16  Study Conclusions  - Left ventricle: The cavity size was severely dilated. There was   mild concentric hypertrophy. Systolic function was severely   reduced. The estimated ejection fraction was in the range of   10-15%. Diffuse hypokinesis. The study is not technically   sufficient to allow evaluation of  LV diastolic function. - Aortic valve: There was no regurgitation. - Aortic root: The aortic root was normal in size. - Ascending aorta: The ascending aorta was normal in size. - Mitral valve: There was mild to moderate regurgitation directed   centrally. - Left atrium: The atrium was moderately to severely dilated. - Right ventricle: Systolic function was normal. - Right atrium: The atrium was moderately dilated. - Tricuspid valve: There was mild regurgitation. - Pulmonary arteries: Systolic pressure was within the normal   range. - Inferior vena cava: The vessel was normal in size. - Pericardium, extracardiac: There was no pericardial effusion.  Impressions:  - Compared to the prior study from 03/17/2016 LVEF has decreased   from 25-30% to 10-15%.   There is heavy smoke in the LV  apex seen on contrast images   consistent with pre-thrombotic state. No organized thrombus was   seen.   An anticoagulation is recommended.    Medications:     Scheduled Medications: . apixaban  5 mg Oral BID  . digoxin  0.125 mg Oral Daily  . fluticasone furoate-vilanterol  1 puff Inhalation Daily  . pantoprazole  40 mg Oral Daily  . potassium chloride  20 mEq Oral BID  . sodium chloride flush  10-40 mL Intracatheter Q12H    Infusions: . amiodarone 30 mg/hr (04/22/16 0650)  . milrinone 0.125 mcg/kg/min (04/21/16 1800)    PRN Medications: sodium chloride, acetaminophen, loperamide, ondansetron (ZOFRAN) IV, sodium chloride flush, traZODone   Assessment/Plan   1. Acute combined systolic and diastolic CHF, NYHA III 2. NICM  3. Atrial fibrillation with RVR 4. LV thrombus 5. Questionable sleep apnea 6. Acute renal insufficiency  7. 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 He has not had an ischemic work up, denies ACS symptoms. Volume stable, CVP 6 today.   Still with  Afib, Amio gtt has improved rates. Plan for TEE/DC-CV on Friday.   No beta blocker while he is decompensated. Avoiding ACE/ARB due to renal insufficiency.    Length of Stay: [place], NP  04/22/2016, 7:44 AM  Advanced Heart Failure Team Pager [phone] (M-F; 7a - 4p)  Please contact [facility] for night-coverage after hours (4p -7a ) and weekends on [email or web address]  Patient seen and examined with Jettie Booze, NP. We discussed all aspects of the encounter. I agree with the assessment and plan as stated above.   More stable on milrinone and IV amio. Co-ox up. Feeling better. Creatinine improving (baseline 1.5). Able to ambulate room.  Volume status ok off diuretics for now. Will hold one more day.   Heparin switched to apixaban for TEE and possible DC-CV tomorrow.  Discussed with PharmD this am.   No b-blocker or ARB currently.   Arbutus Leas, NP   7:44 AM

## 2016-04-23 ENCOUNTER — Inpatient Hospital Stay (HOSPITAL_COMMUNITY): Payer: PRIVATE HEALTH INSURANCE | Admitting: Anesthesiology

## 2016-04-23 ENCOUNTER — Inpatient Hospital Stay (HOSPITAL_COMMUNITY): Payer: PRIVATE HEALTH INSURANCE

## 2016-04-23 ENCOUNTER — Encounter (HOSPITAL_COMMUNITY): Payer: Self-pay | Admitting: *Deleted

## 2016-04-23 ENCOUNTER — Encounter (HOSPITAL_COMMUNITY)
Admission: EM | Disposition: A | Discharge status: Home / Self Care | Payer: Self-pay | Source: Home / Self Care | Attending: Cardiovascular Disease

## 2016-04-23 DIAGNOSIS — I340 Nonrheumatic mitral (valve) insufficiency: Secondary | ICD-10-CM | POA: Diagnosis not present

## 2016-04-23 DIAGNOSIS — N179 Acute kidney failure, unspecified: Secondary | ICD-10-CM | POA: Diagnosis not present

## 2016-04-23 DIAGNOSIS — I5021 Acute systolic (congestive) heart failure: Secondary | ICD-10-CM | POA: Diagnosis not present

## 2016-04-23 DIAGNOSIS — I480 Paroxysmal atrial fibrillation: Secondary | ICD-10-CM | POA: Diagnosis not present

## 2016-04-23 HISTORY — PX: CARDIOVERSION: SHX1299

## 2016-04-23 HISTORY — PX: TEE WITHOUT CARDIOVERSION: SHX5443

## 2016-04-23 LAB — COOXEMETRY PANEL
CARBOXYHEMOGLOBIN: 0.5 % (ref 0.5–1.5)
METHEMOGLOBIN: 0.6 % (ref 0.0–1.5)
O2 SAT: 96 %
Total hemoglobin: 13.3 g/dL (ref 12.0–16.0)

## 2016-04-23 LAB — BASIC METABOLIC PANEL
ANION GAP: 10 (ref 5–15)
BUN: 22 mg/dL — ABNORMAL HIGH (ref 6–20)
CALCIUM: 8.7 mg/dL — AB (ref 8.9–10.3)
CO2: 26 mmol/L (ref 22–32)
Chloride: 105 mmol/L (ref 101–111)
Creatinine, Ser: 2.06 mg/dL — ABNORMAL HIGH (ref 0.61–1.24)
GFR, EST AFRICAN AMERICAN: 39 mL/min — AB (ref >60)
GFR, EST NON AFRICAN AMERICAN: 33 mL/min — AB (ref >60)
Glucose, Bld: 117 mg/dL — ABNORMAL HIGH (ref 65–99)
Potassium: 3.5 mmol/L (ref 3.5–5.1)
SODIUM: 141 mmol/L (ref 135–145)

## 2016-04-23 LAB — CBC
HEMATOCRIT: 39 % (ref 39.0–52.0)
HEMOGLOBIN: 12.7 g/dL — AB (ref 13.0–17.0)
MCH: 28.7 pg (ref 26.0–34.0)
MCHC: 32.6 g/dL (ref 30.0–36.0)
MCV: 88 fL (ref 78.0–100.0)
Platelets: 155 10*3/uL (ref 150–400)
RBC: 4.43 MIL/uL (ref 4.22–5.81)
RDW: 13.4 % (ref 11.5–15.5)
WBC: 5.4 10*3/uL (ref 4.0–10.5)

## 2016-04-23 SURGERY — ECHOCARDIOGRAM, TRANSESOPHAGEAL
Anesthesia: Monitor Anesthesia Care

## 2016-04-23 MED ORDER — DEXMEDETOMIDINE HCL 200 MCG/2ML IV SOLN
INTRAVENOUS | Status: DC | PRN
  Administered 2016-04-23 (×2): 50 ug via INTRAVENOUS

## 2016-04-23 MED ORDER — LIDOCAINE VISCOUS 2 % MT SOLN
OROMUCOSAL | Status: DC | PRN
  Administered 2016-04-23: 10 mL via OROMUCOSAL

## 2016-04-23 MED ORDER — SODIUM CHLORIDE 0.9% FLUSH: 3.0000 mL | INTRAVENOUS | Status: DC | PRN

## 2016-04-23 MED ORDER — PHENYLEPHRINE 40 MCG/ML (10ML) SYRINGE FOR IV PUSH (FOR BLOOD PRESSURE SUPPORT)
PREFILLED_SYRINGE | INTRAVENOUS | Status: DC | PRN
  Administered 2016-04-23: 80 ug via INTRAVENOUS

## 2016-04-23 MED ORDER — BUTAMBEN-TETRACAINE-BENZOCAINE 2-2-14 % EX AERO
INHALATION_SPRAY | CUTANEOUS | Status: DC | PRN
  Administered 2016-04-23: 2 via TOPICAL

## 2016-04-23 MED ORDER — SODIUM CHLORIDE 0.9 % IV SOLN: 250.0000 mL | INTRAVENOUS | Status: DC

## 2016-04-23 MED ORDER — PROPOFOL 10 MG/ML IV BOLUS
INTRAVENOUS | Status: DC | PRN
  Administered 2016-04-23: 20 mg via INTRAVENOUS
  Administered 2016-04-23 (×2): 30 mg via INTRAVENOUS

## 2016-04-23 MED ORDER — SODIUM CHLORIDE 0.9% FLUSH
3.0000 mL | Freq: Two times a day (BID) | INTRAVENOUS | Status: DC
  Administered 2016-04-23 – 2016-04-26 (×6): 3 mL via INTRAVENOUS

## 2016-04-23 MED ORDER — SODIUM CHLORIDE 0.9 % IV SOLN: INTRAVENOUS | Status: DC

## 2016-04-23 MED ORDER — LIDOCAINE VISCOUS 2 % MT SOLN
OROMUCOSAL | Status: AC
  Filled 2016-04-23: qty 15

## 2016-04-23 NOTE — Progress Notes (Signed)
Call placed to Dr. Haroldine Laws, advised that patient HR after cardioversion is 45 and BP 90/63. Verbal order received to turn off amiodarone infusion. This was done. Patient and family notified of this and reason for discontinue.

## 2016-04-23 NOTE — Transfer of Care (Signed)
Immediate Anesthesia Transfer of Care Note  Patient: [name]  Procedure(s) Performed: Procedure(s): TRANSESOPHAGEAL ECHOCARDIOGRAM (TEE) (N/A) CARDIOVERSION (N/A)  Patient Location: PACU and Endoscopy Unit  Anesthesia Type:General  Level of Consciousness: awake, alert , oriented and patient cooperative  Airway & Oxygen Therapy: Patient Spontanous Breathing and Patient connected to nasal cannula oxygen  Post-op Assessment: Report given to RN, Post -op Vital signs reviewed and stable and Patient moving all extremities  Post vital signs: Reviewed and stable  Last Vitals:  Vitals:   04/23/16 1334 04/23/16 1500  BP: (!) 125/101 97/62  Pulse:    Resp: 19 12  Temp: 36.6 C 36.7 C    Last Pain:  Vitals:   04/23/16 1500  TempSrc: Oral         Complications: No apparent anesthesia complications

## 2016-04-23 NOTE — Interval H&P Note (Signed)
History and Physical Interval Note:  04/23/2016 2:24 PM  [name]  has presented today for surgery, with the diagnosis of a fib  The various methods of treatment have been discussed with the patient and family. After consideration of risks, benefits and other options for treatment, the patient has consented to  Procedure(s): TRANSESOPHAGEAL ECHOCARDIOGRAM (TEE) (N/A) CARDIOVERSION (N/A) as a surgical intervention .  The patient's history has been reviewed, patient examined, no change in status, stable for surgery.  I have reviewed the patient's chart and labs.  Questions were answered to the patient's satisfaction.     [name], Quillian Quince

## 2016-04-23 NOTE — CV Procedure (Signed)
    TRANSESOPHAGEAL ECHOCARDIOGRAM GUIDED DIRECT CURRENT CARDIOVERSION  NAME:  [name]   MRN: [id number] DOB:  [birth year]   ADMIT DATE: 04/16/2016  INDICATIONS:  Atrial fibrillation  PROCEDURE:   Informed consent was obtained prior to the procedure. The risks, benefits and alternatives for the procedure were discussed and the patient comprehended these risks.  Risks include, but are not limited to, cough, sore throat, vomiting, nausea, somnolence, esophageal and stomach trauma or perforation, bleeding, low blood pressure, aspiration, pneumonia, infection, trauma to the teeth and death.    After a procedural time-out, the patient was sedated by the anesthesia service. Given the patient tenuous cardiac status, the anesthsia service, under the direction of Dr. Orene Desanctis, was hesitant to sedate the patient fully.Additional sedation was requested.  We initially had some difficulty getting the probe down due to the patient's gag reflex. The patient was further sedated and the probe was then inserted easily. Multiple views were obtained. The patient had some difficulty tolerating the procedure due to persistent gagging so the TEE was completed as quickly as possible and the probe was removed.   The defibrillation pads were placed in the AP position and patient then underwent deeper sedation and DC-CV was performed with 200J synchronized shock x 1 with prompt conversion to sinus bradycardia.  FINDINGS:  LEFT VENTRICLE: Dilated. EF = 15% Severe global HK, No LV thrombus or smoke.   RIGHT VENTRICLE: Dilated. Systolic function severely reduced.  LEFT ATRIUM: Markedly dilated  LEFT ATRIAL APPENDAGE: No clot  RIGHT ATRIUM: Dilated  AORTIC VALVE:  Trileaflet. No AI/AS  MITRAL VALVE:  Normal. Moderate central MR  TRICUSPID VALVE: Normal. Moderate central TR.  PULMONIC VALVE: Normal. Trivial PR   INTERATRIAL SEPTUM: ? Small PFO. No ASD  PERICARDIUM: No effusion  DESCENDING AORTA: Mild  plaque   Juan Bickers, MD  7:06 PM

## 2016-04-23 NOTE — Progress Notes (Signed)
Advanced Heart Failure Rounding Note  PCP: [name], MD Primary Cardiologist: Dr. Johnsie Cancel  Subjective:    Admitted with rapid afib, EF found to be 10-15%. Has had 2 episodes of rapid Afib in the past month both times cardioverted in the ED and only held NSR for a few days each time. Started on Amio gtt on 04/19/16, milrinone started 04/21/16  Co ox 57->69.9-> 83%  CVP 9 this am. Weight stable. HR in the 90-100's Afib.  Feels good. No dyspnea or othopnea. Ambulating room. No bleeding. Heparin switched to Eliquis,   Creatinine 2.21->2.01->2.21->2.56->2.43->2.29->2.06  Feels well this morning, denies palpitations and SOB. Answered his questions about the procedure today.   Objective:   Weight Range: 227 lb 11.2 oz (103.3 kg) Body mass index is 30.88 kg/m.   Vital Signs:   Temp:  [97.6 F (36.4 C)-98.1 F (36.7 C)] 97.8 F (36.6 C) (03/30 0403) Pulse Rate:  [100-108] 106 (03/30 0403) Resp:  [12] 12 (03/30 0403) BP: (101-118)/(72-91) 118/91 (03/30 0403) SpO2:  [97 %-99 %] 97 % (03/30 0403) Weight:  [225 lb 15.5 oz (102.5 kg)-227 lb 11.2 oz (103.3 kg)] 227 lb 11.2 oz (103.3 kg) (03/30 0640) Last BM Date: 04/22/16  Weight change: Filed Weights   04/22/16 0801 04/23/16 0403 04/23/16 0640  Weight: 225 lb 15.5 oz (102.5 kg) 227 lb 1.2 oz (103 kg) 227 lb 11.2 oz (103.3 kg)    Intake/Output:   Intake/Output Summary (Last 24 hours) at 04/23/16 0732 Last data filed at 04/23/16 0600  Gross per 24 hour  Intake              752 ml  Output              575 ml  Net              177 ml     Physical Exam: General:  Male, in NAD. Sitting up in chair  HEENT: Normal.   Neck: supple. JVP 7 . Carotids 2+ bilat; no bruits. No lymphadenopathy or thyromegaly appreciated. Cor: PMI nondisplaced. Irregularly irregular,tachy.  no murmurs.  Lungs: CTAB no wheeze Abdomen: Obese. Soft, nontender, nondistended. No hepatosplenomegaly. No bruits or masses. + bowel sounds.  Extremities: no  cyanosis, clubbing, rash. No edema.  Warm Neuro: alert & orientedx3, cranial nerves grossly intact. moves all 4 extremities w/o difficulty. Affect pleasant    Telemetry: Atrial fib, rates in 90-115.Personally reviewed  .    Labs: CBC  Recent Labs  04/22/16 0415 04/23/16 0520  WBC 4.3 5.4  HGB 12.8* 12.7*  HCT 39.2 39.0  MCV 88.1 88.0  PLT 162 716   Basic Metabolic Panel  Recent Labs  04/21/16 0351 04/22/16 0415 04/23/16 0520  NA 141 144 141  K 3.2* 3.3* 3.5  CL 105 106 105  CO2 26 27 26   GLUCOSE 93 92 117*  BUN 32* 30* 22*  CREATININE 2.43* 2.29* 2.06*  CALCIUM 9.1 8.9 8.7*  MG 2.0  --   --     BNP: BNP (last 3 results)  Recent Labs  04/16/16 0200  BNP 1,201.3*    ProBNP (last 3 results)  Recent Labs  03/30/16 1039  PROBNP 3,730*   Imaging/Studies: Transthoracic Echocardiography 04/17/16  Study Conclusions  - Left ventricle: The cavity size was severely dilated. There was   mild concentric hypertrophy. Systolic function was severely   reduced. The estimated ejection fraction was in the range of   10-15%. Diffuse hypokinesis. The study is  not technically   sufficient to allow evaluation of LV diastolic function. - Aortic valve: There was no regurgitation. - Aortic root: The aortic root was normal in size. - Ascending aorta: The ascending aorta was normal in size. - Mitral valve: There was mild to moderate regurgitation directed   centrally. - Left atrium: The atrium was moderately to severely dilated. - Right ventricle: Systolic function was normal. - Right atrium: The atrium was moderately dilated. - Tricuspid valve: There was mild regurgitation. - Pulmonary arteries: Systolic pressure was within the normal   range. - Inferior vena cava: The vessel was normal in size. - Pericardium, extracardiac: There was no pericardial effusion.  Impressions:  - Compared to the prior study from 03/17/2016 LVEF has decreased   from 25-30% to  10-15%.   There is heavy smoke in the LV apex seen on contrast images   consistent with pre-thrombotic state. No organized thrombus was   seen.   An anticoagulation is recommended.    Medications:     Scheduled Medications: . apixaban  5 mg Oral BID  . digoxin  0.125 mg Oral Daily  . fluticasone furoate-vilanterol  1 puff Inhalation Daily  . pantoprazole  40 mg Oral Daily  . potassium chloride  20 mEq Oral BID  . sodium chloride flush  10-40 mL Intracatheter Q12H    Infusions: . amiodarone 30 mg/hr (04/23/16 0724)  . milrinone 0.125 mcg/kg/min (04/22/16 1710)    PRN Medications: sodium chloride, acetaminophen, loperamide, ondansetron (ZOFRAN) IV, sodium chloride flush, traZODone   Assessment/Plan   1. Acute combined systolic and diastolic CHF, NYHA III 2. NICM  3. Atrial fibrillation with RVR 4. LV thrombus 5. Questionable sleep apnea 6. Acute renal insufficiency  7. 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 He has not had an ischemic work up, denies ACS symptoms. Volume stable, CVP 9 today.   Still with  Afib, Amio gtt has improved rates. Plan for TEE/DC-CV today.   Creatinine improving, he feels better. No beta blocker. On Apixaban. Diuretics on hold, CVP is 9 and weight is stable. MD to advise on timing for stopping milrinone.    Length of Stay: [place], NP  04/23/2016, 7:32 AM Advanced Heart Failure Team  Pager [phone] M-F 7am-4pm.  Please contact [facility] for night-coverage after hours (4p -7a ) and weekends on [email or web address]  Patient seen and examined with Jettie Booze, NP. We discussed all aspects of the encounter. I agree with the assessment and plan as stated above.   He is much improved on milrinone. Co-ox better. Creatinine improving. Volume status going back up. Wil continue milrinone through cardioversion. Resume diuretics.   AF rate still elevated despite IV amio. Will plan TEE & DCCV today if  no clot. We discussed the procedure in detail with him and his family.   If DCCV fails will need to consider AVN ablation and CRT given degree of his cardiomyopathy.   Continue to supp electrolytes as needed.   Glori Bickers, MD  2:22 PM

## 2016-04-23 NOTE — Anesthesia Preprocedure Evaluation (Signed)
Anesthesia Evaluation  Patient identified by MRN, date of birth, ID band Patient awake    Airway Mallampati: II   Neck ROM: Full    Dental no notable dental hx.    Pulmonary asthma ,    breath sounds clear to auscultation       Cardiovascular hypertension, +CHF   Rhythm:Irregular Rate:Normal  Low EF by ECHO   Neuro/Psych    GI/Hepatic GERD  ,  Endo/Other    Renal/GU Renal InsufficiencyRenal disease     Musculoskeletal   Abdominal   Peds  Hematology   Anesthesia Other Findings   Reproductive/Obstetrics                             Anesthesia Physical Anesthesia Plan  ASA: III  Anesthesia Plan: MAC   Post-op Pain Management:    Induction: Intravenous  Airway Management Planned: Nasal Cannula  Additional Equipment:   Intra-op Plan:   Post-operative Plan:   Informed Consent: I have reviewed the patients History and Physical, chart, labs and discussed the procedure including the risks, benefits and alternatives for the proposed anesthesia with the patient or authorized representative who has indicated his/her understanding and acceptance.     Plan Discussed with:   Anesthesia Plan Comments:         Anesthesia Quick Evaluation

## 2016-04-23 NOTE — H&P (View-Only) (Signed)
Advanced Heart Failure Rounding Note  PCP: [name], MD Primary Cardiologist: Dr. Johnsie Cancel  Subjective:    Admitted with rapid afib, EF found to be 10-15%. Has had 2 episodes of rapid Afib in the past month both times cardioverted in the ED and only held NSR for a few days each time. Started on Amio gtt on 04/19/16, milrinone started 04/21/16  Co ox 57->69.9-> 83%  CVP 9 this am. Weight stable. HR in the 90-100's Afib.  Feels good. No dyspnea or othopnea. Ambulating room. No bleeding. Heparin switched to Eliquis,   Creatinine 2.21->2.01->2.21->2.56->2.43->2.29->2.06  Feels well this morning, denies palpitations and SOB. Answered his questions about the procedure today.   Objective:   Weight Range: 227 lb 11.2 oz (103.3 kg) Body mass index is 30.88 kg/m.   Vital Signs:   Temp:  [97.6 F (36.4 C)-98.1 F (36.7 C)] 97.8 F (36.6 C) (03/30 0403) Pulse Rate:  [100-108] 106 (03/30 0403) Resp:  [12] 12 (03/30 0403) BP: (101-118)/(72-91) 118/91 (03/30 0403) SpO2:  [97 %-99 %] 97 % (03/30 0403) Weight:  [225 lb 15.5 oz (102.5 kg)-227 lb 11.2 oz (103.3 kg)] 227 lb 11.2 oz (103.3 kg) (03/30 0640) Last BM Date: 04/22/16  Weight change: Filed Weights   04/22/16 0801 04/23/16 0403 04/23/16 0640  Weight: 225 lb 15.5 oz (102.5 kg) 227 lb 1.2 oz (103 kg) 227 lb 11.2 oz (103.3 kg)    Intake/Output:   Intake/Output Summary (Last 24 hours) at 04/23/16 0732 Last data filed at 04/23/16 0600  Gross per 24 hour  Intake              752 ml  Output              575 ml  Net              177 ml     Physical Exam: General:  Male, in NAD. Sitting up in chair  HEENT: Normal.   Neck: supple. JVP 7 . Carotids 2+ bilat; no bruits. No lymphadenopathy or thyromegaly appreciated. Cor: PMI nondisplaced. Irregularly irregular,tachy.  no murmurs.  Lungs: CTAB no wheeze Abdomen: Obese. Soft, nontender, nondistended. No hepatosplenomegaly. No bruits or masses. + bowel sounds.  Extremities: no  cyanosis, clubbing, rash. No edema.  Warm Neuro: alert & orientedx3, cranial nerves grossly intact. moves all 4 extremities w/o difficulty. Affect pleasant    Telemetry: Atrial fib, rates in 90-115.Personally reviewed  .    Labs: CBC  Recent Labs  04/22/16 0415 04/23/16 0520  WBC 4.3 5.4  HGB 12.8* 12.7*  HCT 39.2 39.0  MCV 88.1 88.0  PLT 162 675   Basic Metabolic Panel  Recent Labs  04/21/16 0351 04/22/16 0415 04/23/16 0520  NA 141 144 141  K 3.2* 3.3* 3.5  CL 105 106 105  CO2 26 27 26   GLUCOSE 93 92 117*  BUN 32* 30* 22*  CREATININE 2.43* 2.29* 2.06*  CALCIUM 9.1 8.9 8.7*  MG 2.0  --   --     BNP: BNP (last 3 results)  Recent Labs  04/16/16 0200  BNP 1,201.3*    ProBNP (last 3 results)  Recent Labs  03/30/16 1039  PROBNP 3,730*   Imaging/Studies: Transthoracic Echocardiography 04/17/16  Study Conclusions  - Left ventricle: The cavity size was severely dilated. There was   mild concentric hypertrophy. Systolic function was severely   reduced. The estimated ejection fraction was in the range of   10-15%. Diffuse hypokinesis. The study is  not technically   sufficient to allow evaluation of LV diastolic function. - Aortic valve: There was no regurgitation. - Aortic root: The aortic root was normal in size. - Ascending aorta: The ascending aorta was normal in size. - Mitral valve: There was mild to moderate regurgitation directed   centrally. - Left atrium: The atrium was moderately to severely dilated. - Right ventricle: Systolic function was normal. - Right atrium: The atrium was moderately dilated. - Tricuspid valve: There was mild regurgitation. - Pulmonary arteries: Systolic pressure was within the normal   range. - Inferior vena cava: The vessel was normal in size. - Pericardium, extracardiac: There was no pericardial effusion.  Impressions:  - Compared to the prior study from 03/17/2016 LVEF has decreased   from 25-30% to  10-15%.   There is heavy smoke in the LV apex seen on contrast images   consistent with pre-thrombotic state. No organized thrombus was   seen.   An anticoagulation is recommended.    Medications:     Scheduled Medications: . apixaban  5 mg Oral BID  . digoxin  0.125 mg Oral Daily  . fluticasone furoate-vilanterol  1 puff Inhalation Daily  . pantoprazole  40 mg Oral Daily  . potassium chloride  20 mEq Oral BID  . sodium chloride flush  10-40 mL Intracatheter Q12H    Infusions: . amiodarone 30 mg/hr (04/23/16 0724)  . milrinone 0.125 mcg/kg/min (04/22/16 1710)    PRN Medications: sodium chloride, acetaminophen, loperamide, ondansetron (ZOFRAN) IV, sodium chloride flush, traZODone   Assessment/Plan   1. Acute combined systolic and diastolic CHF, NYHA III 2. NICM  3. Atrial fibrillation with RVR 4. LV thrombus 5. Questionable sleep apnea 6. Acute renal insufficiency  7. 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 He has not had an ischemic work up, denies ACS symptoms. Volume stable, CVP 9 today.   Still with  Afib, Amio gtt has improved rates. Plan for TEE/DC-CV today.   Creatinine improving, he feels better. No beta blocker. On Apixaban. Diuretics on hold, CVP is 9 and weight is stable. MD to advise on timing for stopping milrinone.    Length of Stay: [place], NP  04/23/2016, 7:32 AM Advanced Heart Failure Team  Pager [phone] M-F 7am-4pm.  Please contact [facility] for night-coverage after hours (4p -7a ) and weekends on [email or web address]  Patient seen and examined with Jettie Booze, NP. We discussed all aspects of the encounter. I agree with the assessment and plan as stated above.   He is much improved on milrinone. Co-ox better. Creatinine improving. Volume status going back up. Wil continue milrinone through cardioversion. Resume diuretics.   AF rate still elevated despite IV amio. Will plan TEE & DCCV today if  no clot. We discussed the procedure in detail with him and his family.   If DCCV fails will need to consider AVN ablation and CRT given degree of his cardiomyopathy.   Continue to supp electrolytes as needed.   Glori Bickers, MD  2:22 PM

## 2016-04-24 DIAGNOSIS — N179 Acute kidney failure, unspecified: Secondary | ICD-10-CM | POA: Diagnosis not present

## 2016-04-24 DIAGNOSIS — I480 Paroxysmal atrial fibrillation: Secondary | ICD-10-CM | POA: Diagnosis not present

## 2016-04-24 DIAGNOSIS — I5021 Acute systolic (congestive) heart failure: Secondary | ICD-10-CM | POA: Diagnosis not present

## 2016-04-24 LAB — BASIC METABOLIC PANEL
Anion gap: 9 (ref 5–15)
BUN: 19 mg/dL (ref 6–20)
CHLORIDE: 108 mmol/L (ref 101–111)
CO2: 25 mmol/L (ref 22–32)
CREATININE: 1.87 mg/dL — AB (ref 0.61–1.24)
Calcium: 9 mg/dL (ref 8.9–10.3)
GFR, EST AFRICAN AMERICAN: 43 mL/min — AB (ref >60)
GFR, EST NON AFRICAN AMERICAN: 37 mL/min — AB (ref >60)
Glucose, Bld: 85 mg/dL (ref 65–99)
POTASSIUM: 3.9 mmol/L (ref 3.5–5.1)
SODIUM: 142 mmol/L (ref 135–145)

## 2016-04-24 LAB — CBC
HEMATOCRIT: 40.6 % (ref 39.0–52.0)
HEMOGLOBIN: 13.2 g/dL (ref 13.0–17.0)
MCH: 28.8 pg (ref 26.0–34.0)
MCHC: 32.5 g/dL (ref 30.0–36.0)
MCV: 88.5 fL (ref 78.0–100.0)
Platelets: 175 10*3/uL (ref 150–400)
RBC: 4.59 MIL/uL (ref 4.22–5.81)
RDW: 13.5 % (ref 11.5–15.5)
WBC: 7.1 10*3/uL (ref 4.0–10.5)

## 2016-04-24 LAB — COOXEMETRY PANEL
Carboxyhemoglobin: 1 % (ref 0.5–1.5)
Methemoglobin: 0.9 % (ref 0.0–1.5)
O2 Saturation: 69.3 %
Total hemoglobin: 13.9 g/dL (ref 12.0–16.0)

## 2016-04-24 MED ORDER — AMIODARONE HCL 200 MG PO TABS
200.0000 mg | ORAL_TABLET | Freq: Two times a day (BID) | ORAL | Status: DC
  Administered 2016-04-24 – 2016-04-27 (×7): 200 mg via ORAL
  Filled 2016-04-24 (×7): qty 1

## 2016-04-24 MED ORDER — MILRINONE LACTATE IN DEXTROSE 20-5 MG/100ML-% IV SOLN
0.0625 ug/kg/min | INTRAVENOUS | Status: DC
  Administered 2016-04-25: 0.0625 ug/kg/min via INTRAVENOUS
  Filled 2016-04-24: qty 100

## 2016-04-24 MED ORDER — FUROSEMIDE 40 MG PO TABS
40.0000 mg | ORAL_TABLET | Freq: Two times a day (BID) | ORAL | Status: DC
  Administered 2016-04-24 – 2016-04-27 (×6): 40 mg via ORAL
  Filled 2016-04-24 (×6): qty 1

## 2016-04-24 NOTE — Plan of Care (Signed)
Problem: Cardiac: Goal: Ability to achieve and maintain adequate cardiopulmonary perfusion will improve Outcome: Progressing Pts VSS, A&Ox 4 and getting OOB independently. Pt with steady and balanced gait with low risk of fall. Pt remains on milrinone drip and started on Amiodarone tablets. Pt in NSR, afebrile and BP WNL. Pt with good appetite and tolerating diet at this time. PO lasix given and awaiting an increase in urine output. Family at bedside, will continue to monitor.

## 2016-04-24 NOTE — Progress Notes (Signed)
Advanced Heart Failure Rounding Note  PCP: [name], MD Primary Cardiologist: Dr. Johnsie Cancel  Subjective:    Admitted with rapid afib, EF found to be 10-15%. Has had 2 episodes of rapid Afib in the past month both times cardioverted in the ED and only held NSR for a few days each time. Started on Amio gtt on 04/19/16, milrinone started 04/21/16  Co ox 57->69.9-> 83%->69.3%  Underwent DC-CV 3/30. IV amio stopped due to bradycardia. Remains in NSR HR 60s. Denies SOB, orthopnea or PND. + edema   Creatinine 2.21->2.01->2.21->2.56->2.43->2.29->2.06->1.87    Objective:   Weight Range: 103.4 kg (228 lb) Body mass index is 30.92 kg/m.   Vital Signs:   Temp:  [97.6 F (36.4 C)-98 F (36.7 C)] 97.8 F (36.6 C) (03/31 0754) Pulse Rate:  [32-73] 62 (03/31 1100) Resp:  [12-22] 20 (03/31 1100) BP: (86-129)/(57-101) 110/62 (03/31 1100) SpO2:  [93 %-97 %] 94 % (03/31 1100) Weight:  [103.4 kg (228 lb)] 103.4 kg (228 lb) (03/31 0500) Last BM Date: 04/24/16  Weight change: Filed Weights   04/23/16 0403 04/23/16 0640 04/24/16 0500  Weight: 103 kg (227 lb 1.2 oz) 103.3 kg (227 lb 11.2 oz) 103.4 kg (228 lb)    Intake/Output:   Intake/Output Summary (Last 24 hours) at 04/24/16 1130 Last data filed at 04/24/16 0900  Gross per 24 hour  Intake            798.8 ml  Output              525 ml  Net            273.8 ml     Physical Exam: General:  Male, in NAD. Sitting up in chair  HEENT: Normal.   Neck: supple. JVP 8  Carotids 2+ bilat; no bruits. No lymphadenopathy or thyromegaly appreciated. Cor: PMI nondisplaced. RRR no murmurs.  Lungs: clear Abdomen: Obese. Soft, nontender, nondistended No hepatosplenomegaly. No bruits or masses. + bowel sounds.  Extremities: no cyanosis, clubbing, rash. 2+ edema.  Warm Neuro: alert & orientedx3, cranial nerves grossly intact. moves all 4 extremities w/o difficulty. Affect pleasant    Telemetry: NSR 60s Personally reviewed   .     Labs: CBC  Recent Labs  04/23/16 0520 04/24/16 0445  WBC 5.4 7.1  HGB 12.7* 13.2  HCT 39.0 40.6  MCV 88.0 88.5  PLT 155 592   Basic Metabolic Panel  Recent Labs  04/23/16 0520 04/24/16 0445  NA 141 142  K 3.5 3.9  CL 105 108  CO2 26 25  GLUCOSE 117* 85  BUN 22* 19  CREATININE 2.06* 1.87*  CALCIUM 8.7* 9.0    BNP: BNP (last 3 results)  Recent Labs  04/16/16 0200  BNP 1,201.3*    ProBNP (last 3 results)  Recent Labs  03/30/16 1039  PROBNP 3,730*   Imaging/Studies: Transthoracic Echocardiography 04/17/16  Study Conclusions  - Left ventricle: The cavity size was severely dilated. There was   mild concentric hypertrophy. Systolic function was severely   reduced. The estimated ejection fraction was in the range of   10-15%. Diffuse hypokinesis. The study is not technically   sufficient to allow evaluation of LV diastolic function. - Aortic valve: There was no regurgitation. - Aortic root: The aortic root was normal in size. - Ascending aorta: The ascending aorta was normal in size. - Mitral valve: There was mild to moderate regurgitation directed   centrally. - Left atrium: The atrium was moderately to severely  dilated. - Right ventricle: Systolic function was normal. - Right atrium: The atrium was moderately dilated. - Tricuspid valve: There was mild regurgitation. - Pulmonary arteries: Systolic pressure was within the normal   range. - Inferior vena cava: The vessel was normal in size. - Pericardium, extracardiac: There was no pericardial effusion.  Impressions:  - Compared to the prior study from 03/17/2016 LVEF has decreased   from 25-30% to 10-15%.   There is heavy smoke in the LV apex seen on contrast images   consistent with pre-thrombotic state. No organized thrombus was   seen.   An anticoagulation is recommended.    Medications:     Scheduled Medications: . apixaban  5 mg Oral BID  . digoxin  0.125 mg Oral Daily  .  fluticasone furoate-vilanterol  1 puff Inhalation Daily  . pantoprazole  40 mg Oral Daily  . potassium chloride  20 mEq Oral BID  . sodium chloride flush  10-40 mL Intracatheter Q12H  . sodium chloride flush  3 mL Intravenous Q12H    Infusions: . sodium chloride    . amiodarone Stopped (04/23/16 1520)  . milrinone 0.125 mcg/kg/min (04/24/16 0800)    PRN Medications: sodium chloride, acetaminophen, loperamide, ondansetron (ZOFRAN) IV, sodium chloride flush, sodium chloride flush, traZODone   Assessment/Plan   1. Acute combined systolic and diastolic CHF, NYHA III 2. NICM  3. Atrial fibrillation with RVR --s/p DC-CV 3/30 4. LV thrombus --resolved 5. Questionable sleep apnea 6. Acute renal insufficiency  7. Hypokalemia  He is s/p DC-CV yesterday. Amio stopped post-prcedure due to low HR. Now improved. Remains in NSR. Co-ox and renal function continue to improve.   Now with 2+ edema. Weight up 3 pounds over past 2 days.   Goal now will be to try to get him in a suitable home regimen. Will wean milrinone to 0.125. Start po amio. Resume lasix. Follow co-ox and renal function.   Continue apixaban.    Length of Stay: 8   Glori Bickers, MD  04/24/2016, 11:30 AM Advanced Heart Failure Team  Pager [phone] M-F 7am-4pm.  Please contact [facility] for night-coverage after hours (4p -7a ) and weekends on [email or web address]

## 2016-04-25 DIAGNOSIS — I5021 Acute systolic (congestive) heart failure: Secondary | ICD-10-CM | POA: Diagnosis not present

## 2016-04-25 DIAGNOSIS — N179 Acute kidney failure, unspecified: Secondary | ICD-10-CM | POA: Diagnosis not present

## 2016-04-25 DIAGNOSIS — I480 Paroxysmal atrial fibrillation: Secondary | ICD-10-CM | POA: Diagnosis not present

## 2016-04-25 LAB — COOXEMETRY PANEL
Carboxyhemoglobin: 1 % (ref 0.5–1.5)
Methemoglobin: 1 % (ref 0.0–1.5)
O2 Saturation: 84 %
Total hemoglobin: 14.3 g/dL (ref 12.0–16.0)

## 2016-04-25 LAB — BASIC METABOLIC PANEL
ANION GAP: 10 (ref 5–15)
BUN: 21 mg/dL — ABNORMAL HIGH (ref 6–20)
CO2: 26 mmol/L (ref 22–32)
Calcium: 9.2 mg/dL (ref 8.9–10.3)
Chloride: 105 mmol/L (ref 101–111)
Creatinine, Ser: 2.18 mg/dL — ABNORMAL HIGH (ref 0.61–1.24)
GFR calc Af Amer: 36 mL/min — ABNORMAL LOW (ref >60)
GFR calc non Af Amer: 31 mL/min — ABNORMAL LOW (ref >60)
GLUCOSE: 90 mg/dL (ref 65–99)
POTASSIUM: 3.9 mmol/L (ref 3.5–5.1)
Sodium: 141 mmol/L (ref 135–145)

## 2016-04-25 LAB — CBC
HCT: 42.1 % (ref 39.0–52.0)
Hemoglobin: 13.7 g/dL (ref 13.0–17.0)
MCH: 28.9 pg (ref 26.0–34.0)
MCHC: 32.5 g/dL (ref 30.0–36.0)
MCV: 88.8 fL (ref 78.0–100.0)
PLATELETS: 181 10*3/uL (ref 150–400)
RBC: 4.74 MIL/uL (ref 4.22–5.81)
RDW: 13.8 % (ref 11.5–15.5)
WBC: 5.6 10*3/uL (ref 4.0–10.5)

## 2016-04-25 NOTE — Progress Notes (Signed)
Advanced Heart Failure Rounding Note  PCP: [name], MD Primary Cardiologist: Dr. Johnsie Cancel  Subjective:    Admitted with rapid afib, EF found to be 10-15%. Has had 2 episodes of rapid Afib in the past month both times cardioverted in the ED and only held NSR for a few days each time. Started on Amio gtt on 04/19/16, milrinone started 04/21/16  Co ox 57->69.9-> 83%->69.3%->84% (?)  Underwent DC-CV 3/30.   Milrinone turned down to 0.0625 yesterday. Feels well. Maintaining NSR. CVP 4. Weight down a pound.   Creatinine 2.21->2.01->2.21->2.56->2.43->2.29->2.06->1.87->2.18    Objective:   Weight Range: 103.4 kg (227 lb 15.3 oz) Body mass index is 30.92 kg/m.   Vital Signs:   Temp:  [97.8 F (36.6 C)-98 F (36.7 C)] 97.9 F (36.6 C) (04/01 0744) Pulse Rate:  [58-69] 65 (04/01 0744) Resp:  [20-24] 20 (04/01 0357) BP: (114-133)/(74-94) 133/91 (04/01 0744) SpO2:  [94 %-100 %] 96 % (04/01 0744) Weight:  [103.4 kg (227 lb 15.3 oz)] 103.4 kg (227 lb 15.3 oz) (04/01 0357) Last BM Date: 04/24/16  Weight change: Filed Weights   04/23/16 0640 04/24/16 0500 04/25/16 0357  Weight: 103.3 kg (227 lb 11.2 oz) 103.4 kg (228 lb) 103.4 kg (227 lb 15.3 oz)    Intake/Output:   Intake/Output Summary (Last 24 hours) at 04/25/16 1221 Last data filed at 04/25/16 0953  Gross per 24 hour  Intake            645.1 ml  Output             2925 ml  Net          -2279.9 ml     Physical Exam: General:  Male, in NAD. Sitting up in chair  HEENT: Normal.  anicteric Neck: supple. JVP flat Carotids 2+ bilat; no bruits. No lymphadenopathy or thyromegaly appreciated. Cor: PMI nondisplaced.RRR no murmur Lungs: clear no wheeze Abdomen: Obese. Soft, nontender, nondistended No hepatosplenomegaly. No bruits or masses. + bowel sounds.  Extremities: no cyanosis, clubbing, rash. 1-2+ edema. TED hose  Warm Neuro: alert & orientedx3, cranial nerves grossly intact. moves all 4 extremities w/o difficulty.  Affect pleasant    Telemetry: NSR 60s. Personally reviewed    .    Labs: CBC  Recent Labs  04/24/16 0445 04/25/16 0520  WBC 7.1 5.6  HGB 13.2 13.7  HCT 40.6 42.1  MCV 88.5 88.8  PLT 175 580   Basic Metabolic Panel  Recent Labs  04/24/16 0445 04/25/16 0520  NA 142 141  K 3.9 3.9  CL 108 105  CO2 25 26  GLUCOSE 85 90  BUN 19 21*  CREATININE 1.87* 2.18*  CALCIUM 9.0 9.2    BNP: BNP (last 3 results)  Recent Labs  04/16/16 0200  BNP 1,201.3*    ProBNP (last 3 results)  Recent Labs  03/30/16 1039  PROBNP 3,730*   Imaging/Studies: Transthoracic Echocardiography 04/17/16  Study Conclusions  - Left ventricle: The cavity size was severely dilated. There was   mild concentric hypertrophy. Systolic function was severely   reduced. The estimated ejection fraction was in the range of   10-15%. Diffuse hypokinesis. The study is not technically   sufficient to allow evaluation of LV diastolic function. - Aortic valve: There was no regurgitation. - Aortic root: The aortic root was normal in size. - Ascending aorta: The ascending aorta was normal in size. - Mitral valve: There was mild to moderate regurgitation directed   centrally. - Left atrium:  The atrium was moderately to severely dilated. - Right ventricle: Systolic function was normal. - Right atrium: The atrium was moderately dilated. - Tricuspid valve: There was mild regurgitation. - Pulmonary arteries: Systolic pressure was within the normal   range. - Inferior vena cava: The vessel was normal in size. - Pericardium, extracardiac: There was no pericardial effusion.  Impressions:  - Compared to the prior study from 03/17/2016 LVEF has decreased   from 25-30% to 10-15%.   There is heavy smoke in the LV apex seen on contrast images   consistent with pre-thrombotic state. No organized thrombus was   seen.   An anticoagulation is recommended.    Medications:     Scheduled  Medications: . amiodarone  200 mg Oral BID  . apixaban  5 mg Oral BID  . digoxin  0.125 mg Oral Daily  . fluticasone furoate-vilanterol  1 puff Inhalation Daily  . furosemide  40 mg Oral BID  . pantoprazole  40 mg Oral Daily  . potassium chloride  20 mEq Oral BID  . sodium chloride flush  10-40 mL Intracatheter Q12H  . sodium chloride flush  3 mL Intravenous Q12H    Infusions: . sodium chloride    . milrinone 0.0625 mcg/kg/min (04/25/16 0539)    PRN Medications: sodium chloride, acetaminophen, loperamide, ondansetron (ZOFRAN) IV, sodium chloride flush, sodium chloride flush, traZODone   Assessment/Plan   1. Acute combined systolic and diastolic CHF, NYHA III 2. NICM  3. Atrial fibrillation with RVR --s/p DC-CV 3/30 4. LV thrombus --resolved 5. Questionable sleep apnea 6. Acute renal insufficiency  7. Hypokalemia  He is s/p DC-CV on 3/30. Maintaining NSR on po amio. Will continue. On Eliquis for [facility]  Co-ox stable on low-dose milrinone. Will stop milrinone today.   Still with some peripheral edema. CVP down. Will continue po lasix. May need to cut back.    Renal function a bit worse today. Unclear if related to diuresis or output. Will follow closely. Baseline creatine seems to be 1.4-1.6   Length of Stay: 9   Glori Bickers, MD  04/25/2016, 12:21 PM Advanced Heart Failure Team  Pager [phone] M-F 7am-4pm.  Please contact [facility] for night-coverage after hours (4p -7a ) and weekends on [email or web address]

## 2016-04-26 ENCOUNTER — Encounter (HOSPITAL_COMMUNITY): Payer: Self-pay | Admitting: Internal Medicine

## 2016-04-26 ENCOUNTER — Ambulatory Visit (HOSPITAL_COMMUNITY): Payer: PRIVATE HEALTH INSURANCE | Admitting: Nurse Practitioner

## 2016-04-26 DIAGNOSIS — N179 Acute kidney failure, unspecified: Secondary | ICD-10-CM | POA: Diagnosis not present

## 2016-04-26 DIAGNOSIS — I480 Paroxysmal atrial fibrillation: Secondary | ICD-10-CM | POA: Diagnosis not present

## 2016-04-26 DIAGNOSIS — I5021 Acute systolic (congestive) heart failure: Secondary | ICD-10-CM | POA: Diagnosis not present

## 2016-04-26 LAB — CBC
HCT: 40.6 % (ref 39.0–52.0)
Hemoglobin: 13.3 g/dL (ref 13.0–17.0)
MCH: 28.9 pg (ref 26.0–34.0)
MCHC: 32.8 g/dL (ref 30.0–36.0)
MCV: 88.1 fL (ref 78.0–100.0)
PLATELETS: 170 10*3/uL (ref 150–400)
RBC: 4.61 MIL/uL (ref 4.22–5.81)
RDW: 13.7 % (ref 11.5–15.5)
WBC: 5.2 10*3/uL (ref 4.0–10.5)

## 2016-04-26 LAB — BASIC METABOLIC PANEL
ANION GAP: 9 (ref 5–15)
BUN: 18 mg/dL (ref 6–20)
CHLORIDE: 105 mmol/L (ref 101–111)
CO2: 27 mmol/L (ref 22–32)
Calcium: 9 mg/dL (ref 8.9–10.3)
Creatinine, Ser: 2.09 mg/dL — ABNORMAL HIGH (ref 0.61–1.24)
GFR calc non Af Amer: 33 mL/min — ABNORMAL LOW (ref >60)
GFR, EST AFRICAN AMERICAN: 38 mL/min — AB (ref >60)
Glucose, Bld: 80 mg/dL (ref 65–99)
POTASSIUM: 3.5 mmol/L (ref 3.5–5.1)
SODIUM: 141 mmol/L (ref 135–145)

## 2016-04-26 LAB — COOXEMETRY PANEL
CARBOXYHEMOGLOBIN: 1.1 % (ref 0.5–1.5)
Methemoglobin: 0.9 % (ref 0.0–1.5)
O2 Saturation: 79.2 %
Total hemoglobin: 13.8 g/dL (ref 12.0–16.0)

## 2016-04-26 MED ORDER — POTASSIUM CHLORIDE CRYS ER 20 MEQ PO TBCR
20.0000 meq | EXTENDED_RELEASE_TABLET | Freq: Once | ORAL | Status: AC
  Administered 2016-04-26: 20 meq via ORAL
  Filled 2016-04-26: qty 1

## 2016-04-26 NOTE — Progress Notes (Signed)
Advanced Heart Failure Rounding Note  PCP: [name], MD Primary Cardiologist: Dr. Johnsie Cancel  Subjective:    Admitted with rapid afib, EF found to be 10-15%. Has had 2 episodes of rapid Afib in the past month both times cardioverted in the ED and only held NSR for a few days each time. Started on Amio gtt on 04/19/16, milrinone started 04/21/16  Co ox 57->69.9-> 83%->69.3%->84% -> 79.2% OFF milrinone  Underwent DC-CV 3/30. Remains in NSR.   Milrinone turned off 04/25/16. Feeling good. CVP stable 3-4. Weight unchanged. Still with some LE edema despite TED hose. Weight down 13 pounds from admit but up 6 pounds from lowest weight in hospital.   Creatinine 1.87 -> 2.18 -> 2.09. Out 1.8 L.   Objective:   Weight Range: 227 lb 8.2 oz (103.2 kg) Body mass index is 30.86 kg/m.   Vital Signs:   Temp:  [97.5 F (36.4 C)-98.5 F (36.9 C)] 98.5 F (36.9 C) (04/02 0806) Pulse Rate:  [51-69] 51 (04/02 0806) Resp:  [9-20] 9 (04/02 0806) BP: (121-144)/(83-100) 140/98 (04/02 0806) SpO2:  [93 %-97 %] 97 % (04/02 0806) Weight:  [227 lb 8.2 oz (103.2 kg)] 227 lb 8.2 oz (103.2 kg) (04/02 0342) Last BM Date: 04/24/16  Weight change: Filed Weights   04/24/16 0500 04/25/16 0357 04/26/16 0342  Weight: 228 lb (103.4 kg) 227 lb 15.3 oz (103.4 kg) 227 lb 8.2 oz (103.2 kg)    Intake/Output:   Intake/Output Summary (Last 24 hours) at 04/26/16 0817 Last data filed at 04/26/16 0344  Gross per 24 hour  Intake                0 ml  Output             1850 ml  Net            -1850 ml     Physical Exam: General:  Lying in bed, NAD.   HEENT: Normal  Neck: Supple. JVP flat. Carotids 2+ bilat; no bruits. No thyromegaly or nodule noted.   Cor: PMI non-displaced. RRR. No murmur.  Lungs: CTAB, no wheeze.  Abdomen: Obese, soft, NT, ND, no HSM. No bruits or masses. +BS  Extremities: No cyanosis, clubbing, rash. 1-2+ edema at ankles and above ted hose. Warm.  Neuro: Alert & oriented x 3. Cranial nerves  grossly intact. Moves all 4 extremities w/o difficulty. Affect pleasant    Telemetry: Personally reviewed, NSR 60s.   Labs: CBC  Recent Labs  04/25/16 0520 04/26/16 0545  WBC 5.6 5.2  HGB 13.7 13.3  HCT 42.1 40.6  MCV 88.8 88.1  PLT 181 967   Basic Metabolic Panel  Recent Labs  04/25/16 0520 04/26/16 0545  NA 141 141  K 3.9 3.5  CL 105 105  CO2 26 27  GLUCOSE 90 80  BUN 21* 18  CREATININE 2.18* 2.09*  CALCIUM 9.2 9.0   BNP: BNP (last 3 results)  Recent Labs  04/16/16 0200  BNP 1,201.3*    ProBNP (last 3 results)  Recent Labs  03/30/16 1039  PROBNP 3,730*   Imaging/Studies: Transthoracic Echocardiography 04/17/16  Study Conclusions  - Left ventricle: The cavity size was severely dilated. There was   mild concentric hypertrophy. Systolic function was severely   reduced. The estimated ejection fraction was in the range of   10-15%. Diffuse hypokinesis. The study is not technically   sufficient to allow evaluation of LV diastolic function. - Aortic valve: There was no regurgitation. -  Aortic root: The aortic root was normal in size. - Ascending aorta: The ascending aorta was normal in size. - Mitral valve: There was mild to moderate regurgitation directed   centrally. - Left atrium: The atrium was moderately to severely dilated. - Right ventricle: Systolic function was normal. - Right atrium: The atrium was moderately dilated. - Tricuspid valve: There was mild regurgitation. - Pulmonary arteries: Systolic pressure was within the normal   range. - Inferior vena cava: The vessel was normal in size. - Pericardium, extracardiac: There was no pericardial effusion.  Impressions:  - Compared to the prior study from 03/17/2016 LVEF has decreased   from 25-30% to 10-15%.   There is heavy smoke in the LV apex seen on contrast images   consistent with pre-thrombotic state. No organized thrombus was   seen.   An anticoagulation is  recommended.  Medications:     Scheduled Medications: . amiodarone  200 mg Oral BID  . apixaban  5 mg Oral BID  . digoxin  0.125 mg Oral Daily  . fluticasone furoate-vilanterol  1 puff Inhalation Daily  . furosemide  40 mg Oral BID  . pantoprazole  40 mg Oral Daily  . potassium chloride  20 mEq Oral BID  . sodium chloride flush  10-40 mL Intracatheter Q12H  . sodium chloride flush  3 mL Intravenous Q12H    Infusions: . sodium chloride      PRN Medications: sodium chloride, acetaminophen, loperamide, ondansetron (ZOFRAN) IV, sodium chloride flush, sodium chloride flush, traZODone   Assessment/Plan   1. Acute combined systolic and diastolic CHF, NYHA III - Echo 04/17/16 LVEF 10-15% 2. NICM  3. Atrial fibrillation with RVR --s/p DC-CV 3/30 4. LV thrombus --resolved 5. Questionable sleep apnea 6. Acute renal insufficiency  7. Hypokalemia  Pt is maintaining NSR s/p DC-CV on 3/30. Stable on Eliquis.   Coox stable OFF milrinone.   Has persistent 1-2+ peripheral edema. CVP 3-4. Creatinine down a bit on Lasix 40 mg BID, but remains at 2.09 compared to baseline of 1.4 -> 1.6.    Pt relatively stable. BUN stable. K 3.5, will supp.   Continue ted hose. Ambulate. Will have PT see. May need HHPT.   Likely home in next 24-48 hours.   Length of Stay: [address]  Annamaria Helling  04/26/2016, 8:17 AM Advanced Heart Failure Team  Pager [phone] M-F 7am-4pm.  Please contact [facility] for night-coverage after hours (4p -7a ) and weekends on [email or web address]  Patient seen and examined with the above-signed Advanced Practice Provider and/or Housestaff. I personally reviewed laboratory data, imaging studies and relevant notes. I independently examined the patient and formulated the important aspects of the plan. I have edited the note to reflect any of my changes or salient points. I have personally discussed the plan with the patient and/or family.  He is stable today. Now  off milrinone. Co-ox looks good. Renal function improved. Maintaining NSR on po amio. On Eliquis. Still with some mild peripheral edema but CVP low. Would continue po diuretics. With recent AKI would not push diuresis much more for now.   Possibly home tomorrow.   Glori Bickers, MD  8:47 AM

## 2016-04-26 NOTE — Progress Notes (Signed)
PT Cancellation Note  Patient Details Name: NORTH ESTERLINE MRN: [id number] DOB: 1955-12-27   Cancelled Treatment:    Reason Eval/Treat Not Completed: PT screened, no needs identified, will sign off. Per nursing and pt the pt has been amb in hallways for multiple laps without any difficulty.   Shary Decamp Maycok 04/26/2016, 2:10 PM  [employer] [place]

## 2016-04-26 NOTE — Anesthesia Postprocedure Evaluation (Addendum)
Anesthesia Post Note  Patient: ROXAS CLYMER  Procedure(s) Performed: Procedure(s) (LRB): TRANSESOPHAGEAL ECHOCARDIOGRAM (TEE) (N/A) CARDIOVERSION (N/A)  Patient location during evaluation: Endoscopy Anesthesia Type: MAC Level of consciousness: awake and alert Pain management: pain level controlled Vital Signs Assessment: post-procedure vital signs reviewed and stable Respiratory status: spontaneous breathing, nonlabored ventilation, respiratory function stable and patient connected to nasal cannula oxygen Cardiovascular status: stable and blood pressure returned to baseline Anesthetic complications: no       Last Vitals:  Vitals:   04/26/16 0342 04/26/16 0806  BP: (!) 144/96 (!) 140/98  Pulse: 69 (!) 51  Resp: 17 (!) 9  Temp: 36.6 C 36.9 C    Last Pain:  Vitals:   04/26/16 0806  TempSrc: Oral                 Ivyrose Hashman,JAMES TERRILL

## 2016-04-27 DIAGNOSIS — I480 Paroxysmal atrial fibrillation: Secondary | ICD-10-CM | POA: Diagnosis not present

## 2016-04-27 DIAGNOSIS — I5021 Acute systolic (congestive) heart failure: Secondary | ICD-10-CM | POA: Diagnosis not present

## 2016-04-27 LAB — CBC
HCT: 42.7 % (ref 39.0–52.0)
HEMOGLOBIN: 14 g/dL (ref 13.0–17.0)
MCH: 28.9 pg (ref 26.0–34.0)
MCHC: 32.8 g/dL (ref 30.0–36.0)
MCV: 88.2 fL (ref 78.0–100.0)
Platelets: 192 10*3/uL (ref 150–400)
RBC: 4.84 MIL/uL (ref 4.22–5.81)
RDW: 13.4 % (ref 11.5–15.5)
WBC: 6.1 10*3/uL (ref 4.0–10.5)

## 2016-04-27 LAB — COOXEMETRY PANEL
CARBOXYHEMOGLOBIN: 1 % (ref 0.5–1.5)
Methemoglobin: 0.9 % (ref 0.0–1.5)
O2 Saturation: 66.1 %
Total hemoglobin: 14.1 g/dL (ref 12.0–16.0)

## 2016-04-27 LAB — BASIC METABOLIC PANEL
ANION GAP: 10 (ref 5–15)
BUN: 17 mg/dL (ref 6–20)
CHLORIDE: 103 mmol/L (ref 101–111)
CO2: 29 mmol/L (ref 22–32)
Calcium: 9.5 mg/dL (ref 8.9–10.3)
Creatinine, Ser: 2.02 mg/dL — ABNORMAL HIGH (ref 0.61–1.24)
GFR calc non Af Amer: 34 mL/min — ABNORMAL LOW (ref >60)
GFR, EST AFRICAN AMERICAN: 40 mL/min — AB (ref >60)
GLUCOSE: 86 mg/dL (ref 65–99)
POTASSIUM: 3.9 mmol/L (ref 3.5–5.1)
Sodium: 142 mmol/L (ref 135–145)

## 2016-04-27 LAB — DIGOXIN LEVEL: Digoxin Level: 0.5 ng/mL — ABNORMAL LOW (ref 0.8–2.0)

## 2016-04-27 MED ORDER — POTASSIUM CHLORIDE CRYS ER 20 MEQ PO TBCR: 20.0000 meq | EXTENDED_RELEASE_TABLET | Freq: Two times a day (BID) | ORAL | 6 refills | Status: DC

## 2016-04-27 MED ORDER — DIGOXIN 125 MCG PO TABS: 0.1250 mg | ORAL_TABLET | Freq: Every day | ORAL | 6 refills | Status: DC

## 2016-04-27 MED ORDER — FUROSEMIDE 40 MG PO TABS: 40.0000 mg | ORAL_TABLET | Freq: Two times a day (BID) | ORAL | 6 refills | Status: DC

## 2016-04-27 MED ORDER — AMIODARONE HCL 200 MG PO TABS: 200.0000 mg | ORAL_TABLET | Freq: Two times a day (BID) | ORAL | 6 refills | Status: DC

## 2016-04-27 MED ORDER — ACETAMINOPHEN 325 MG PO TABS: 650.0000 mg | ORAL_TABLET | Freq: Four times a day (QID) | ORAL | Status: AC | PRN

## 2016-04-27 NOTE — Progress Notes (Signed)
DC instructions given to patient and spouse. Medication regimen, new Rx, activity level, follow up appt explained with pt. Questions answered. Rx given to patient. VSS. eICU and CCMD notified of discharge. PICC line DC by IV team, dressing CDI. PIV DC, hemostasis achieved. All belongings sent home with patient and spouse at time of DC. Pt DC via wheelchair by RN to private vehicle driven by spouse.

## 2016-04-27 NOTE — Discharge Instructions (Signed)
Digoxin tablets or capsules What is this medicine? DIGOXIN (di JOX in) is used to treat congestive heart failure and heart rhythm problems. This medicine may be used for other purposes; ask your health care provider or pharmacist if you have questions. COMMON BRAND NAME(S): Digitek, Lanoxicaps, Lanoxin What should I tell my health care provider before I take this medicine? They need to know if you have any of these conditions: -certain heart rhythm disorders -heart disease or recent heart attack -kidney or liver disease -an unusual or allergic reaction to digoxin, other medicines, foods, dyes, or preservatives -pregnant or trying to get pregnant -breast-feeding How should I use this medicine? Take this medicine by mouth with a glass of water. Follow the directions on the prescription label. Take your doses at regular intervals. Do not take your medicine more often than directed. Talk to your pediatrician regarding the use of this medicine in children. Special care may be needed. Overdosage: If you think you have taken too much of this medicine contact a poison control center or emergency room at once. NOTE: This medicine is only for you. Do not share this medicine with others. What if I miss a dose? If you miss a dose, take it as soon as you can. If it is almost time for your next dose, take only that dose. Do not take double or extra doses. What may interact with this medicine? -activated charcoal -albuterol -alprazolam -antacids -antiviral medicines for HIV or AIDS like ritonavir and saquinavir -calcium -certain antibiotics like azithromycin, clarithromycin, erythromycin, gentamicin, neomycin, trimethoprim, and tetracycline -certain medicines for blood pressure, heart disease, irregular heart beat -certain medicines for cancer -certain medicines for cholesterol like atorvastatin, cholestyramine, and colestipol -certain medicines for diabetes, like acarbose, exenatide, miglitol, and  metformin -certain medicines for fungal infections like ketoconazole and itraconazole -certain medicines for stomach problems like omeprazole, esomeprazole, lansoprazole, rabeprazole, metoclopramide, and sucralfate -conivaptan -cyclosporine -diphenoxylate -epinephrine -kaolin; pectin -nefazodone -NSAIDS, medicines for pain and inflammation, like celecoxib, ibuprofen, or naproxen -penicillamine -phenytoin -propantheline -quinine -phenytoin -rifampin -succinylcholine -St. John's Wort -sulfasalazine -teriparatide -thyroid hormones -tolvaptan This list may not describe all possible interactions. Give your health care provider a list of all the medicines, herbs, non-prescription drugs, or dietary supplements you use. Also tell them if you smoke, drink alcohol, or use illegal drugs. Some items may interact with your medicine. What should I watch for while using this medicine? Visit your doctor or health care professional for regular checks on your progress. Do not stop taking this medicine without the advice of your doctor or health care professional, even if you feel better. Do not change the brand you are taking, other brands may affect you differently. Check your heart rate and blood pressure regularly while you are taking this medicine. Ask your doctor or health care professional what your heart rate and blood pressure should be, and when you should contact him or her. Your doctor or health care professional also may schedule regular blood tests and electrocardiograms to check your progress. Watch your diet. Less digoxin may be absorbed from the stomach if you have a diet high in bran fiber. Do not treat yourself for coughs, colds or allergies without asking your doctor or health care professional for advice. Some ingredients can increase possible side effects. What side effects may I notice from receiving this medicine? Side effects that you should report to your doctor or health care  professional as soon as possible: -allergic reactions like skin rash, itching or hives, swelling of  the face, lips, or tongue -changes in behavior, mood, or mental ability -changes in vision -confusion -fast, irregular heartbeat -feeling faint or lightheaded, falls -headache -nausea, vomiting -unusual bleeding, bruising -unusually weak or tired Side effects that usually do not require medical attention (report to your doctor or health care professional if they continue or are bothersome): -breast enlargement in men and women -diarrhea This list may not describe all possible side effects. Call your doctor for medical advice about side effects. You may report side effects to FDA at 1-800-FDA-1088. Where should I keep my medicine? Keep out of the reach of children. Store at room temperature between 15 and 30 degrees C (59 and 86 degrees F). Protect from light and moisture. Throw away any unused medicine after the expiration date. NOTE: This sheet is a summary. It may not cover all possible information. If you have questions about this medicine, talk to your doctor, pharmacist, or health care provider.  2018 Elsevier/Gold Standard (2015-12-31 15:40:26)

## 2016-04-27 NOTE — Progress Notes (Signed)
Advanced Heart Failure Rounding Note  PCP: [name], MD Primary Cardiologist: Dr. Johnsie Cancel  Subjective:    Admitted with rapid afib, EF found to be 10-15%. Has had 2 episodes of rapid Afib in the past month both times cardioverted in the ED and only held NSR for a few days each time. Started on Amio gtt on 04/19/16, milrinone started 04/21/16  Co ox 57->69.9-> 83%->69.3%->84% -> 79.2% -> 66.1% OFF milrinone.   Underwent DC-CV 3/30. Remains in NSR.   Milrinone turned off 04/25/16.   Feeling near his baseline. Wants to go home. Denies SOB or CP. Ankles still mildly swollen. No lightheadedness or dizziness.     Creatinine 1.87 -> 2.18 -> 2.09 -> 2.02.  Out 800 cc.  Weight shows down 10 lbs. Doubt accuracy.   Objective:   Weight Range: 217 lb 12.8 oz (98.8 kg) Body mass index is 29.54 kg/m.   Vital Signs:   Temp:  [97.4 F (36.3 C)-98.2 F (36.8 C)] 97.8 F (36.6 C) (04/03 0700) Pulse Rate:  [50-75] 50 (04/03 0700) Resp:  [10-51] 51 (04/03 0700) BP: (121-136)/(74-97) 132/92 (04/03 0700) SpO2:  [94 %-99 %] 99 % (04/03 0700) Weight:  [217 lb 12.8 oz (98.8 kg)] 217 lb 12.8 oz (98.8 kg) (04/03 0438) Last BM Date: 04/26/16  Weight change: Filed Weights   04/25/16 0357 04/26/16 0342 04/27/16 0438  Weight: 227 lb 15.3 oz (103.4 kg) 227 lb 8.2 oz (103.2 kg) 217 lb 12.8 oz (98.8 kg)    Intake/Output:   Intake/Output Summary (Last 24 hours) at 04/27/16 0851 Last data filed at 04/27/16 0700  Gross per 24 hour  Intake             1490 ml  Output             2300 ml  Net             -810 ml     Physical Exam: General:  Sitting in chair. NAD.    HEENT: Normal Neck: Supple. JVP 6-7 cm. Carotids 2+ bilat; no bruits. No thyromegaly or nodule noted.   Cor: PMI non-displaced. RRR. No murmur.   Lungs: Clear, normal effort.  Abdomen: Obese, soft, NT, ND, no HSM. No bruits or masses. +BS  Extremities: No cyanosis, clubbing, or rash. Trace to 1 + ankle edema. Warm.  Neuro: Alert  & oriented x 3. Cranial nerves grossly intact. Moves all 4 extremities w/o difficulty. Affect pleasant    Telemetry: Personally reviewed, NSR 60s   Labs: CBC  Recent Labs  04/26/16 0545 04/27/16 0443  WBC 5.2 6.1  HGB 13.3 14.0  HCT 40.6 42.7  MCV 88.1 88.2  PLT 170 093   Basic Metabolic Panel  Recent Labs  04/26/16 0545 04/27/16 0443  NA 141 142  K 3.5 3.9  CL 105 103  CO2 27 29  GLUCOSE 80 86  BUN 18 17  CREATININE 2.09* 2.02*  CALCIUM 9.0 9.5   BNP: BNP (last 3 results)  Recent Labs  04/16/16 0200  BNP 1,201.3*    ProBNP (last 3 results)  Recent Labs  03/30/16 1039  PROBNP 3,730*   Imaging/Studies: Transthoracic Echocardiography 04/17/16  Study Conclusions  - Left ventricle: The cavity size was severely dilated. There was   mild concentric hypertrophy. Systolic function was severely   reduced. The estimated ejection fraction was in the range of   10-15%. Diffuse hypokinesis. The study is not technically   sufficient to allow evaluation of LV  diastolic function. - Aortic valve: There was no regurgitation. - Aortic root: The aortic root was normal in size. - Ascending aorta: The ascending aorta was normal in size. - Mitral valve: There was mild to moderate regurgitation directed   centrally. - Left atrium: The atrium was moderately to severely dilated. - Right ventricle: Systolic function was normal. - Right atrium: The atrium was moderately dilated. - Tricuspid valve: There was mild regurgitation. - Pulmonary arteries: Systolic pressure was within the normal   range. - Inferior vena cava: The vessel was normal in size. - Pericardium, extracardiac: There was no pericardial effusion.  Impressions:  - Compared to the prior study from 03/17/2016 LVEF has decreased   from 25-30% to 10-15%.   There is heavy smoke in the LV apex seen on contrast images   consistent with pre-thrombotic state. No organized thrombus was   seen.   An  anticoagulation is recommended.  Medications:     Scheduled Medications: . amiodarone  200 mg Oral BID  . apixaban  5 mg Oral BID  . digoxin  0.125 mg Oral Daily  . fluticasone furoate-vilanterol  1 puff Inhalation Daily  . furosemide  40 mg Oral BID  . pantoprazole  40 mg Oral Daily  . potassium chloride  20 mEq Oral BID  . sodium chloride flush  10-40 mL Intracatheter Q12H  . sodium chloride flush  3 mL Intravenous Q12H    Infusions: . sodium chloride      PRN Medications: sodium chloride, acetaminophen, loperamide, ondansetron (ZOFRAN) IV, sodium chloride flush, sodium chloride flush, traZODone   Assessment/Plan   1. Acute combined systolic and diastolic CHF, NYHA III - Echo 04/17/16 LVEF 10-15% 2. NICM  3. Atrial fibrillation with RVR --s/p DC-CV 3/30 4. LV thrombus --resolved 5. Questionable sleep apnea 6. Acute renal insufficiency  7. Hypokalemia  Remains in NSR s/p DCV 04/23/16. Stable on Eliquis. No bleeding.   Mixed venous saturation stable OFF milrinone. Will pull PICC line for home.   Volume status looks OK. Creatinine remains slightly up from baseline. Will continue lasix 40 mg BID for now. May need to consider decreasing at outpatient visit. Continues to have trace to 1+ peripheral edema.    BUN and K stable.   Continue ted hose. No indication for HHPT. Pt ambulating halls without difficulty.   Pt OK for home today with close follow up. Please see d/c summary for further.   Length of Stay: [address]  Annamaria Helling  04/27/2016, 8:51 AM Advanced Heart Failure Team  Pager [phone] M-F 7am-4pm.  Please contact [facility] for night-coverage after hours (4p -7a ) and weekends on [email or web address]  Patient seen and examined with the above-signed Advanced Practice Provider and/or Housestaff. I personally reviewed laboratory data, imaging studies and relevant notes. I independently examined the patient and formulated the important aspects of the  plan. I have edited the note to reflect any of my changes or salient points. I have personally discussed the plan with the patient and/or family.  He looks great. Volume status looks good. Maintaining NSR. Co-ox 66%. Renal function stable. Can go home today with close f/u in HF Clinic.  Glori Bickers, MD  11:39 AM

## 2016-04-27 NOTE — Discharge Summary (Signed)
Advanced Heart Failure Discharge Note  Discharge Summary   Patient ID: [name] MRN: [id number], DOB/AGE: 1955-11-18 61 y.o. Admit date: 04/16/2016 D/C date:     04/27/2016   Primary Discharge Diagnoses:  1. 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 Paroxysmal Afib with RVR s/p DCV 04/23/16 3. LV thrombus - resolved on follow up TEE 4. ? Sleep apnea - needs sleep study 5. AKI on CKD III 6. Hypokalemia - resolved.  Hospital Course:   [name] is a 61 y.o. male with systolic CHF diagnosed in 02/2016 and Afib.  Seen in ED twice in last month and had DCCV both times for Afib RVR. Only held NSR for several days each time.    Echo 03/17/16 LVEF 25-30%, No "smoke" or LV thombus noted.   Pt presented to [facility] 04/16[facility]16 with acutely worsening fatigue, SOB, and LE edema.  + PND and orthopnea.  Pertinent labs on admission include K 3.6, Cr 2.1, BNP 1200, troponin x 1 negative. Has been getting lasix IV BID x 3 days. Creatinine continued to rise.  HF team asked to see with decreased EF on echo below as compared to previous and difficult diuresis.   Started on milrinone with marginal mixed venous saturation at 57% and for optimization prior to TEE/DCCV.  Pt noted symptomatic improvement on milrinone and had gradual improvement of creatinine with increased renal perfusion. Eliquis resumed for anticoag leading up to DCCV.   Pt underwent TEE + DCCV 04/23/16 with return to NSR. With return to NSR we were able to wean off of milrinone.  Creatinine improved but remained slightly above his baseline.   Overall pt diuresed 9.5 L and down 22 lbs from admission weight.   Pt was examined on am of 04/27/16 and determined to be stable for discharge to home with close follow up as below.  Pt only on a limited HF regimen on discharge due to marginal/low output and AKI.  Cardiomyopathy thought to be mostly tachy-mediated with Afib RVR over past few months.   Echo 04/17/16  LVEF 10-15% (although listed as 20-25% in one section), Heavy smoke in LV concerning for development of LV thrombus, Normal RV, mild/mod MR, LAE mod/severe   Discharge Weight: 217 lbs Discharge Vitals: Blood pressure (!) 121/95, pulse (!) 55, temperature 97.5 F (36.4 C), temperature source Oral, resp. rate 11, height 6' (1.829 m), weight 217 lb 12.8 oz (98.8 kg), SpO2 93 %.  Labs: Lab Results  Component Value Date   WBC 6.1 04/27/2016   HGB 14.0 04/27/2016   HCT 42.7 04/27/2016   MCV 88.2 04/27/2016   PLT 192 04/27/2016     Recent Labs Lab 04/27/16 0443  NA 142  K 3.9  CL 103  CO2 29  BUN 17  CREATININE 2.02*  CALCIUM 9.5  GLUCOSE 86   No results found for: CHOL, HDL, LDLCALC, TRIG BNP (last 3 results)  Recent Labs  04/16/16 0200  BNP 1,201.3*    ProBNP (last 3 results)  Recent Labs  03/30/16 1039  PROBNP 3,730*     Diagnostic Studies/Procedures   No results found.  Discharge Medications   Allergies as of 04/27/2016      Reactions   Lisinopril Cough      Medication List    STOP taking these medications   carvedilol 12.5 MG tablet Commonly known as:  COREG   irbesartan 300 MG tablet Commonly known as:  AVAPRO  TAKE these medications   acetaminophen 325 MG tablet Commonly known as:  TYLENOL Take 2 tablets (650 mg total) by mouth every 6 (six) hours as needed for mild pain or headache.   albuterol 108 (90 Base) MCG/ACT inhaler Commonly known as:  PROVENTIL HFA;VENTOLIN HFA Inhale 2 puffs into the lungs every 6 (six) hours as needed for wheezing or shortness of breath.   amiodarone 200 MG tablet Commonly known as:  PACERONE Take 1 tablet (200 mg total) by mouth 2 (two) times daily. What changed:  how much to take   apixaban 5 MG Tabs tablet Commonly known as:  ELIQUIS Take 1 tablet (5 mg total) by mouth 2 (two) times daily.   digoxin 0.125 MG tablet Commonly known as:  LANOXIN Take 1 tablet (0.125 mg total) by mouth daily.     fluticasone furoate-vilanterol 100-25 MCG/INH Aepb Commonly known as:  BREO ELLIPTA Inhale 1 puff into the lungs daily.   furosemide 40 MG tablet Commonly known as:  LASIX Take 1 tablet (40 mg total) by mouth 2 (two) times daily. What changed:  medication strength  how much to take  when to take this   pantoprazole 40 MG tablet Commonly known as:  PROTONIX Take 40 mg by mouth daily.   potassium chloride SA 20 MEQ tablet Commonly known as:  K-DUR,KLOR-CON Take 1 tablet (20 mEq total) by mouth 2 (two) times daily.       Disposition   The patient will be discharged in stable condition to home. Discharge Instructions    (HEART FAILURE PATIENTS) Call MD:  Anytime you have any of the following symptoms: 1) 3 pound weight gain in 24 hours or 5 pounds in 1 week 2) shortness of breath, with or without a dry hacking cough 3) swelling in the hands, feet or stomach 4) if you have to sleep on extra pillows at night in order to breathe.    Complete by:  As directed    Diet - low sodium heart healthy    Complete by:  As directed    Heart Failure patients record your daily weight using the same scale at the same time of day    Complete by:  As directed    Increase activity slowly    Complete by:  As directed      Follow-up Information    [facility] Follow up on 05/05/2016.   Specialty:  Cardiology Why:  at 1000 am for post hospital follow up. Please bring all of your medications to your visit. The code for parking is 6000. Leisure centre manager through [employer] on [address], [employer] parking on the right. Can also park in lower ED lot & enter through blue awning Contact information: [address] [id number] [place] [phone]            Duration of Discharge Encounter: Greater than 35 minutes   Signed, Annamaria Helling 04/27/2016, 5:29 PM   Patient seen and examined with the above-signed  Advanced Practice Provider and/or Housestaff. I personally reviewed laboratory data, imaging studies and relevant notes. I independently examined the patient and formulated the important aspects of the plan. I have edited the note to reflect any of my changes or salient points. I have personally discussed the plan with the patient and/or family.  He is much improved. Co-ox ok off inotropes. Maintaining NSR. Renal fucntion and volume status stable. [place] for d/c today with close f/u in HF clinic.  Continue Eliquis   Glori Bickers, MD  11:00 PM

## 2016-04-30 ENCOUNTER — Telehealth (HOSPITAL_COMMUNITY): Payer: Self-pay | Admitting: *Deleted

## 2016-04-30 NOTE — Telephone Encounter (Signed)
Patient called and left VM on triage line saying that he has been taking lasix and last night he woke up having the urge to urinate and was unable to void but just little amounts all through the night.    I called the patient back and asked for further information.  He reported that he took his lasix at 6am and at 5pm and voided significant amounts after taking each dose.  He stated that he voided several times after his 5pm dose prior to going to bed.  He does not have any swelling, weight gain, or shortness of breath.  I explained to him that he may want to contact his PCP that these symptoms were not heart related.  He understands and no further questions at this time.

## 2016-05-01 ENCOUNTER — Telehealth: Payer: Self-pay | Admitting: Physician Assistant

## 2016-05-01 NOTE — Telephone Encounter (Signed)
[name],  I agree. Though hard to know which drug is the culprit. Could be amio or others.   Heather and Doroteo Bradford - Can someone see him Monday or at least call to make sure it is better? Thanks -db

## 2016-05-01 NOTE — Telephone Encounter (Signed)
61 y.o. 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 He was discharged last week after admission for acute on chronic CHF. He underwent cardioversion augmented by amiodarone. He was followed by the heart failure team and required inotropic support with milrinone. He was also placed on digoxin.   He called asking service today with complaint of a rash. He describes this as a erythematous papular rash that is pruritic. He noted on his legs a few days before discharge. Since then, it has spread to his lower back and lower abdomen. He denies difficult swallowing or labial edema. He denies dyspnea, weight gain, edema, chest pain.  PLAN: 1. I have asked him to hold digoxin for now as this seems to be a likely culprit. 2. I asked him to take Benadryl around-the-clock over the next 24-48 hours. 3. If he has side effects to Benadryl, he can switch to Allegra once daily 4. Hydrocortisone 1% cream to rash when necessary 5. I would suggest that he be seen Monday in the heart failure clinic. I will leave them a message. 6. He will report to the ED if his rash worsens or he develops difficulty swallowing or labial edema.  He agrees with this plan. Richardson Dopp, PA-C   05/01/2016 12:11 PM

## 2016-05-03 NOTE — Telephone Encounter (Signed)
Great!  Thank you. Richardson Dopp, PA-C    05/03/2016 1:03 PM

## 2016-05-03 NOTE — Telephone Encounter (Signed)
Spoke with [name] who stated his last dose of digoxin was on Saturday and he started using hydrocortisone cream on the affected areas. He states that by Sunday he noticed significant improvement. I have advised him to remain off of the digoxin. He has an appointment with Juan Grinder, NP-C on Wednesday that he is aware of.   [name] K. Velva Harman, PharmD, BCPS, CPP Clinical Pharmacist Pager: [phone] Phone: [phone] 05/03/2016 11:20 AM

## 2016-05-03 NOTE — Telephone Encounter (Signed)
Left VM for pt to call back and discuss.   Ruta Hinds. Velva Harman, PharmD, BCPS, CPP Clinical Pharmacist Pager: [phone] Phone: [phone] 05/03/2016 9:45 AM

## 2016-05-05 ENCOUNTER — Encounter (HOSPITAL_COMMUNITY): Payer: Self-pay

## 2016-05-05 ENCOUNTER — Ambulatory Visit (HOSPITAL_BASED_OUTPATIENT_CLINIC_OR_DEPARTMENT_OTHER): Payer: PRIVATE HEALTH INSURANCE | Attending: Cardiology | Admitting: Cardiology

## 2016-05-05 ENCOUNTER — Ambulatory Visit (HOSPITAL_COMMUNITY)
Admit: 2016-05-05 | Discharge: 2016-05-05 | Disposition: A | Discharge status: Home / Self Care | Payer: PRIVATE HEALTH INSURANCE | Attending: Cardiology | Admitting: Cardiology

## 2016-05-05 VITALS — Ht 72.0 in | Wt 224.0 lb

## 2016-05-05 VITALS — BP 142/70 | HR 61 | Wt 227.0 lb

## 2016-05-05 DIAGNOSIS — I4891 Unspecified atrial fibrillation: Secondary | ICD-10-CM | POA: Insufficient documentation

## 2016-05-05 DIAGNOSIS — I513 Intracardiac thrombosis, not elsewhere classified: Secondary | ICD-10-CM | POA: Diagnosis not present

## 2016-05-05 DIAGNOSIS — Z79899 Other long term (current) drug therapy: Secondary | ICD-10-CM | POA: Insufficient documentation

## 2016-05-05 DIAGNOSIS — I10 Essential (primary) hypertension: Secondary | ICD-10-CM

## 2016-05-05 DIAGNOSIS — I4819 Other persistent atrial fibrillation: Secondary | ICD-10-CM

## 2016-05-05 DIAGNOSIS — I5022 Chronic systolic (congestive) heart failure: Secondary | ICD-10-CM

## 2016-05-05 DIAGNOSIS — E669 Obesity, unspecified: Secondary | ICD-10-CM | POA: Insufficient documentation

## 2016-05-05 DIAGNOSIS — N183 Chronic kidney disease, stage 3 unspecified: Secondary | ICD-10-CM

## 2016-05-05 DIAGNOSIS — Z8249 Family history of ischemic heart disease and other diseases of the circulatory system: Secondary | ICD-10-CM | POA: Insufficient documentation

## 2016-05-05 DIAGNOSIS — R001 Bradycardia, unspecified: Secondary | ICD-10-CM | POA: Insufficient documentation

## 2016-05-05 DIAGNOSIS — R110 Nausea: Secondary | ICD-10-CM | POA: Insufficient documentation

## 2016-05-05 DIAGNOSIS — G4733 Obstructive sleep apnea (adult) (pediatric): Secondary | ICD-10-CM | POA: Diagnosis not present

## 2016-05-05 DIAGNOSIS — N400 Enlarged prostate without lower urinary tract symptoms: Secondary | ICD-10-CM | POA: Insufficient documentation

## 2016-05-05 DIAGNOSIS — I481 Persistent atrial fibrillation: Secondary | ICD-10-CM | POA: Diagnosis not present

## 2016-05-05 DIAGNOSIS — Z888 Allergy status to other drugs, medicaments and biological substances status: Secondary | ICD-10-CM | POA: Insufficient documentation

## 2016-05-05 DIAGNOSIS — Z7901 Long term (current) use of anticoagulants: Secondary | ICD-10-CM | POA: Insufficient documentation

## 2016-05-05 DIAGNOSIS — I429 Cardiomyopathy, unspecified: Secondary | ICD-10-CM | POA: Insufficient documentation

## 2016-05-05 DIAGNOSIS — J45909 Unspecified asthma, uncomplicated: Secondary | ICD-10-CM | POA: Insufficient documentation

## 2016-05-05 DIAGNOSIS — E782 Mixed hyperlipidemia: Secondary | ICD-10-CM | POA: Insufficient documentation

## 2016-05-05 DIAGNOSIS — R21 Rash and other nonspecific skin eruption: Secondary | ICD-10-CM | POA: Insufficient documentation

## 2016-05-05 DIAGNOSIS — I493 Ventricular premature depolarization: Secondary | ICD-10-CM | POA: Insufficient documentation

## 2016-05-05 DIAGNOSIS — I240 Acute coronary thrombosis not resulting in myocardial infarction: Secondary | ICD-10-CM

## 2016-05-05 DIAGNOSIS — I130 Hypertensive heart and chronic kidney disease with heart failure and stage 1 through stage 4 chronic kidney disease, or unspecified chronic kidney disease: Secondary | ICD-10-CM | POA: Insufficient documentation

## 2016-05-05 DIAGNOSIS — Z87442 Personal history of urinary calculi: Secondary | ICD-10-CM | POA: Insufficient documentation

## 2016-05-05 DIAGNOSIS — I4581 Long QT syndrome: Secondary | ICD-10-CM | POA: Insufficient documentation

## 2016-05-05 DIAGNOSIS — K219 Gastro-esophageal reflux disease without esophagitis: Secondary | ICD-10-CM | POA: Insufficient documentation

## 2016-05-05 LAB — COMPREHENSIVE METABOLIC PANEL
ALT: 23 U/L (ref 17–63)
AST: 21 U/L (ref 15–41)
Albumin: 3.3 g/dL — ABNORMAL LOW (ref 3.5–5.0)
Alkaline Phosphatase: 56 U/L (ref 38–126)
Anion gap: 11 (ref 5–15)
BUN: 20 mg/dL (ref 6–20)
CHLORIDE: 106 mmol/L (ref 101–111)
CO2: 25 mmol/L (ref 22–32)
Calcium: 9 mg/dL (ref 8.9–10.3)
Creatinine, Ser: 1.8 mg/dL — ABNORMAL HIGH (ref 0.61–1.24)
GFR calc Af Amer: 45 mL/min — ABNORMAL LOW (ref >60)
GFR, EST NON AFRICAN AMERICAN: 39 mL/min — AB (ref >60)
Glucose, Bld: 103 mg/dL — ABNORMAL HIGH (ref 65–99)
POTASSIUM: 4 mmol/L (ref 3.5–5.1)
Sodium: 142 mmol/L (ref 135–145)
Total Bilirubin: 0.7 mg/dL (ref 0.3–1.2)
Total Protein: 5.9 g/dL — ABNORMAL LOW (ref 6.5–8.1)

## 2016-05-05 MED ORDER — ISOSORBIDE MONONITRATE ER 30 MG PO TB24: 30.0000 mg | ORAL_TABLET | Freq: Every day | ORAL | 3 refills | Status: DC

## 2016-05-05 MED ORDER — HYDRALAZINE HCL 25 MG PO TABS: 12.5000 mg | ORAL_TABLET | Freq: Three times a day (TID) | ORAL | 3 refills | Status: DC

## 2016-05-05 MED ORDER — AMIODARONE HCL 200 MG PO TABS: 200.0000 mg | ORAL_TABLET | Freq: Every day | ORAL | 3 refills | Status: DC

## 2016-05-05 NOTE — Progress Notes (Signed)
Advanced Heart Failure Clinic Note    Primary Cardiologist: Dr. Johnsie Cancel  HPI: [name] is a 61 year old male with a past medical history of tachy mediated cardiomyopathy, atrial fibrillation (on Eliquis), systolic CHF (EF 62-37%).   He was first diagnosed with Afib in 02/2016, seen in the Afib clinic and the ED for this. He underwent 2 DC-CV and only held NSR for a few days. He started Amiodarone.   Admitted 04/16/16-04/27/16 with rapid afib and decompensation. He was started on milrinone for marginal mixed venous sat of 57% and optimization with plans for repeat DCCV after loading with IV Amiodarone. Underwent TEE/DCCV on 04/23/16 with restoration of NSR.He was continued on Eliquis for anticoagulation at discharge, as he did have some evidence of LV thrombus on Echo, however it appeared to have resolved when he had a TEE on 04/23/16. Overall diuresed 9.5L and discharge weight was 217 pounds.   He returns today for HF follow up. He was discharged on digoxin and developed a rash, so his digoxin was stopped last week and his rash has resolved. He is feeling fatigued, is nauseous most days. Weights at home 219-222 pounds. Sleeps flat, denies PND. Can walk around the grocery store without SOB, no SOB with stairs. Eating healthy, no high salt. Drinking less than 2L a day. Taking medications with compliance, exercising about 15 minutes a day.   Past Medical History:  Diagnosis Date  . Asthma, persistent   . BPH (benign prostatic hyperplasia) 2014  . Combined hyperlipidemia   . GERD (gastroesophageal reflux disease)   . Hypertension   . 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 Obesity   . Renal stone 04/2012  . Seasonal allergic rhinitis     Current Outpatient Prescriptions  Medication Sig Dispense Refill  . acetaminophen (TYLENOL) 325 MG tablet Take 2 tablets (650 mg total) by mouth every 6 (six) hours as needed for mild pain or headache.    . albuterol (PROVENTIL HFA;VENTOLIN HFA) 108 (90  Base) MCG/ACT inhaler Inhale 2 puffs into the lungs every 6 (six) hours as needed for wheezing or shortness of breath.    Marland Kitchen amiodarone (PACERONE) 200 MG tablet Take 1 tablet (200 mg total) by mouth 2 (two) times daily. 60 tablet 6  . apixaban (ELIQUIS) 5 MG TABS tablet Take 1 tablet (5 mg total) by mouth 2 (two) times daily. 60 tablet 3  . fluticasone furoate-vilanterol (BREO ELLIPTA) 100-25 MCG/INH AEPB Inhale 1 puff into the lungs daily.    . furosemide (LASIX) 40 MG tablet Take 1 tablet (40 mg total) by mouth 2 (two) times daily. 60 tablet 6  . pantoprazole (PROTONIX) 40 MG tablet Take 40 mg by mouth daily.    . potassium chloride SA (K-DUR,KLOR-CON) 20 MEQ tablet Take 1 tablet (20 mEq total) by mouth 2 (two) times daily. 60 tablet 6  . tamsulosin (FLOMAX) 0.4 MG CAPS capsule Take 0.4 mg by mouth at bedtime.     No current facility-administered medications for this encounter.     Allergies  Allergen Reactions  . Lisinopril Cough  . Digoxin And Related Rash      Social History   Social History  . Marital status: Married    Spouse name: N/A  . Number of children: N/A  . Years of education: N/A   Occupational History  . 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 Smoking status: Never Smoker  . Smokeless tobacco: Never Used  . Alcohol use No  .  Drug use: No  . Sexual activity: Not on file   Other Topics Concern  . Not on file   Social History Narrative  . No narrative on file      Family History  Problem Relation Age of Onset  . Hypertension Mother     Vitals:   05/05/16 1001  BP: (!) 142/70  Pulse: 61  SpO2: 95%  Weight: 227 lb (103 kg)     PHYSICAL EXAM: General:  Well appearing male in NAD.  HEENT: normal, atraumatic  Neck: supple. no JVD. Carotids 2+ bilat; no bruits. No lymphadenopathy or thyromegaly appreciated. Cor: PMI nondisplaced. Regular rate & rhythm. No rubs, gallops or murmurs. Lungs: clear Abdomen: soft, nontender,  nondistended. No hepatosplenomegaly. No bruits or masses. Good bowel sounds. Extremities: no cyanosis, clubbing, rash, edema Neuro: alert & oriented x 3, cranial nerves grossly intact. moves all 4 extremities w/o difficulty. Affect pleasant.  ECG: NSR, QTc .485    ASSESSMENT & PLAN: 1. Chronic systolic CHF: NICM, felt to be tachymediated. He has no ischemic symptoms, works outside and has never had any chest pain. He does not have a family history of CAD, EKG non ischemic.  - NYHA II - Volume status stable, continue Lasix 40mg  BID.  - renal function inhibits starting ARNI.  - No dig as he had a rash and his creatinine is 2.02 - no beta blocker, HR is 59.  - Start hydralazine 12.5mg  TID and 30mg  Imdur daily for afterload reduction.  - No spiro with elevated creatinine.  - Echo in June 2018.  - At some point he will need an ischemic evaluation.  - Encouraged him to continue exercising. Can increase his walking time to 19min  2. Paroxsymal atrial fibrillation - in NSR today.  - Reduce Amiodarone to 200mg  daily, as his HR is 59.  - Talked about the importance of eye exams.  - TSH 03/2016 - 1.52 - CMET today.  3. Hypertension - Hypertensive, will start hydralazine and Imdur as above.  4. History of LV thrombus - No evidence of thrombus on TEE in March 2018 - Continue Eliquis 5. Chronic kidney disease stage III:  - creatinine 2.01, will get CMET today   Juan Leas, NP 05/05/16

## 2016-05-05 NOTE — Patient Instructions (Signed)
Labs today (will call for abnormal results, otherwise no news is good news)  START taking Hydralazine 12.5 mg (0.5 Tablet) Three Times Daily  START taking Imdur 30 mg (1 Tablet) Once Daily  Decrease Amiodarone to 200 mg (1 Tablet) Once Daily  Follow up in 2 Weeks

## 2016-05-05 NOTE — Progress Notes (Signed)
Advanced Heart Failure Medication Review by a Pharmacist  Does the patient  feel that his/her medications are working for him/her?  yes  Has the patient been experiencing any side effects to the medications prescribed?  no  Does the patient measure his/her own blood pressure or blood glucose at home?  no   Does the patient have any problems obtaining medications due to transportation or finances?   no  Understanding of regimen: good Understanding of indications: good Potential of compliance: good Patient understands to avoid NSAIDs. Patient understands to avoid decongestants.  Issues to address at subsequent visits: None   Pharmacist comments:  [name] is a pleasant 61 yo M presenting with his medication list from his most recent hospital discharge. He states that he rash is almost completely resolved since d/c'ing digoxin last Saturday. He did not have any other medication-related questions or concerns for me at this time.   Ruta Hinds. Velva Harman, PharmD, BCPS, CPP Clinical Pharmacist Pager: [phone] Phone: [phone] 05/05/2016 10:09 AM      Time with patient: 10 minutes Preparation and documentation time: 2 minutes Total time: 12 minutes

## 2016-05-07 ENCOUNTER — Telehealth (HOSPITAL_COMMUNITY): Payer: Self-pay | Admitting: *Deleted

## 2016-05-07 NOTE — Telephone Encounter (Signed)
Patient called triage line asking what he can take over the counter for allergies and for nausea.  I spoke with Doroteo Bradford, PharmD and she advises patient to take Claritin or Zyrtec for allergies and meclizine or Dramamine for nausea.  Patient understands and no further questions at this time.

## 2016-05-09 NOTE — Procedures (Signed)
Patient Name: [name], [name] Date: 05/05/2016 Gender: Male D.O.B: August 18, 1955 Age (years): 34 Referring Provider: Fransico Him MD, ABSM Height (inches): 72 Interpreting Physician: Fransico Him MD, ABSM Weight (lbs): 224 RPSGT: Carolin Coy BMI: 30 MRN: [id number] Neck Size: 14.00  CLINICAL INFORMATION Sleep Study Type: Split Night CPAP  Indication for sleep study: Excessive Daytime Sleepiness  Epworth Sleepiness Score:7  SLEEP STUDY TECHNIQUE As per the AASM Manual for the Scoring of Sleep and Associated Events v2.3 (April 2016) with a hypopnea requiring 4% desaturations.  The channels recorded and monitored were frontal, central and occipital EEG, electrooculogram (EOG), submentalis EMG (chin), nasal and oral airflow, thoracic and abdominal wall motion, anterior tibialis EMG, snore microphone, electrocardiogram, and pulse oximetry. Continuous positive airway pressure (CPAP) was initiated when the patient met split night criteria and was titrated according to treat sleep-disordered breathing.  MEDICATIONS Medications self-administered by patient taken the night of the study : AMIODARONE, ELIQUIS, HYDRALAZINE, POTASSIUM CHLORIDE SA, FLOMAX  RESPIRATORY PARAMETERS Diagnostic Total AHI (/hr): 13.8  RDI (/hr):22.1  OA Index (/hr): 0.8  CA Index (/hr): 6.7 REM AHI (/hr): 3.8  NREM AHI (/hr):16.6  Supine AHI (/hr):80.0  Non-supine AHI (/hr): 10.88 Min O2 Sat (%):87.00  Mean O2 (%): 92.09  Time below 88% (min):0.4      Titration Optimal Pressure (cm):N/A  AHI at Optimal Pressure (/hr):N/A  Min O2 at Optimal Pressure (%):N/A Supine % at Optimal (%):N/A  Sleep % at Optimal (%):N/A      SLEEP ARCHITECTURE The recording time for the entire night was 381.3 minutes.  During a baseline period of 206.8 minutes, the patient slept for 143.9 minutes in REM and nonREM, yielding a sleep efficiency of 69.6%. Sleep onset after lights out was 8.9 minutes with a REM latency  of 94.0 minutes. The patient spent 13.20% of the night in stage N1 sleep, 64.56% in stage N2 sleep, 0.00% in stage N3 and 22.24% in REM.  During the titration period of 168.5 minutes, the patient slept for 69.5 minutes in REM and nonREM, yielding a sleep efficiency of 41.2%. Sleep onset after CPAP initiation was 14.1 minutes with a REM latency of N/A minutes. The patient spent 56.12% of the night in stage N1 sleep, 43.88% in stage N2 sleep, 0.00% in stage N3 and 0.00% in REM.  CARDIAC DATA The 2 lead EKG demonstrated sinus rhythm. The mean heart rate was 50.81 beats per minute. Other EKG findings include: PVCs.  LEG MOVEMENT DATA The total Periodic Limb Movements of Sleep (PLMS) were 0. The PLMS index was 0.00 .  IMPRESSIONS - Mild obstructive sleep apnea occurred during the diagnostic portion of the study (AHI = 13.8 /hour). An optimal PAP pressure could not be selected due to ongoing respiratory events.  - Moderate central sleep apnea occurred during the diagnostic portion of the study (CAI = 6.7/hour). - The patient had minimal or no oxygen desaturation during the diagnostic portion of the study (Min O2 = 87.00%) - The patient snored with Soft snoring volume during the diagnostic portion of the study. - EKG findings include PVCs. - Clinically significant periodic limb movements did not occur during sleep.  DIAGNOSIS - Obstructive Sleep Apnea (327.23 [G47.33 ICD-10])  RECOMMENDATIONS - Recommend repeat CPAP titration full night study. - Avoid alcohol, sedatives and other CNS depressants that may worsen sleep apnea and disrupt normal sleep architecture. - Sleep hygiene should be reviewed to assess factors that may improve sleep quality. - Weight management and regular exercise should be initiated  or continued.   [place], American Board of Sleep Medicine  ELECTRONICALLY SIGNED ON:  05/09/2016, 5:20 PM [place] PH: [phone]   FX: [phone]  [phone] [place]

## 2016-05-11 ENCOUNTER — Ambulatory Visit: Payer: PRIVATE HEALTH INSURANCE | Admitting: Cardiovascular Disease

## 2016-05-18 ENCOUNTER — Telehealth: Payer: Self-pay | Admitting: *Deleted

## 2016-05-18 DIAGNOSIS — G4733 Obstructive sleep apnea (adult) (pediatric): Secondary | ICD-10-CM

## 2016-05-18 NOTE — Telephone Encounter (Signed)
-----   Message from Sueanne Margarita, MD sent at 05/09/2016  5:25 PM EDT ----- Please let patient know that they have sleep apnea but unsuccessful CPAP titration and recommend repeat full night CPAP titration. Please set up titration in the sleep lab.

## 2016-05-18 NOTE — Telephone Encounter (Signed)
Informed patient of cpap titration results and recommendations and he verbalized understanding. Patient understands he will be set up to repeat the full night  in lab cpap titration. Patient understands to call if he has not been contacted via the mail with a date and time of his titration in the coming week. Cpap titration ordered for scheduling

## 2016-05-18 NOTE — Addendum Note (Signed)
Encounter addended by: Arbutus Leas, NP on: 05/18/2016  9:24 AM<BR>    Actions taken: LOS modified, Follow-up modified

## 2016-05-19 ENCOUNTER — Ambulatory Visit (HOSPITAL_COMMUNITY)
Admission: RE | Admit: 2016-05-19 | Discharge: 2016-05-19 | Disposition: A | Discharge status: Home / Self Care | Payer: PRIVATE HEALTH INSURANCE | Source: Ambulatory Visit | Attending: Internal Medicine | Admitting: Internal Medicine

## 2016-05-19 ENCOUNTER — Encounter (HOSPITAL_COMMUNITY): Payer: Self-pay

## 2016-05-19 VITALS — BP 180/90 | HR 58 | Wt 230.0 lb

## 2016-05-19 DIAGNOSIS — E782 Mixed hyperlipidemia: Secondary | ICD-10-CM | POA: Insufficient documentation

## 2016-05-19 DIAGNOSIS — Z006 Encounter for examination for normal comparison and control in clinical research program: Secondary | ICD-10-CM

## 2016-05-19 DIAGNOSIS — R51 Headache: Secondary | ICD-10-CM | POA: Insufficient documentation

## 2016-05-19 DIAGNOSIS — I10 Essential (primary) hypertension: Secondary | ICD-10-CM | POA: Diagnosis not present

## 2016-05-19 DIAGNOSIS — G4733 Obstructive sleep apnea (adult) (pediatric): Secondary | ICD-10-CM

## 2016-05-19 DIAGNOSIS — I5022 Chronic systolic (congestive) heart failure: Secondary | ICD-10-CM | POA: Insufficient documentation

## 2016-05-19 DIAGNOSIS — J45909 Unspecified asthma, uncomplicated: Secondary | ICD-10-CM | POA: Insufficient documentation

## 2016-05-19 DIAGNOSIS — N183 Chronic kidney disease, stage 3 unspecified: Secondary | ICD-10-CM

## 2016-05-19 DIAGNOSIS — I4891 Unspecified atrial fibrillation: Secondary | ICD-10-CM | POA: Diagnosis not present

## 2016-05-19 DIAGNOSIS — I429 Cardiomyopathy, unspecified: Secondary | ICD-10-CM | POA: Insufficient documentation

## 2016-05-19 DIAGNOSIS — Z87442 Personal history of urinary calculi: Secondary | ICD-10-CM | POA: Insufficient documentation

## 2016-05-19 DIAGNOSIS — Z8249 Family history of ischemic heart disease and other diseases of the circulatory system: Secondary | ICD-10-CM | POA: Insufficient documentation

## 2016-05-19 DIAGNOSIS — R21 Rash and other nonspecific skin eruption: Secondary | ICD-10-CM | POA: Insufficient documentation

## 2016-05-19 DIAGNOSIS — E669 Obesity, unspecified: Secondary | ICD-10-CM | POA: Insufficient documentation

## 2016-05-19 DIAGNOSIS — N400 Enlarged prostate without lower urinary tract symptoms: Secondary | ICD-10-CM | POA: Insufficient documentation

## 2016-05-19 DIAGNOSIS — K219 Gastro-esophageal reflux disease without esophagitis: Secondary | ICD-10-CM | POA: Insufficient documentation

## 2016-05-19 DIAGNOSIS — Z888 Allergy status to other drugs, medicaments and biological substances status: Secondary | ICD-10-CM | POA: Insufficient documentation

## 2016-05-19 DIAGNOSIS — I240 Acute coronary thrombosis not resulting in myocardial infarction: Secondary | ICD-10-CM

## 2016-05-19 DIAGNOSIS — I513 Intracardiac thrombosis, not elsewhere classified: Secondary | ICD-10-CM

## 2016-05-19 DIAGNOSIS — Z79899 Other long term (current) drug therapy: Secondary | ICD-10-CM | POA: Insufficient documentation

## 2016-05-19 DIAGNOSIS — I130 Hypertensive heart and chronic kidney disease with heart failure and stage 1 through stage 4 chronic kidney disease, or unspecified chronic kidney disease: Secondary | ICD-10-CM | POA: Insufficient documentation

## 2016-05-19 DIAGNOSIS — Z7901 Long term (current) use of anticoagulants: Secondary | ICD-10-CM | POA: Insufficient documentation

## 2016-05-19 LAB — BASIC METABOLIC PANEL
Anion gap: 8 (ref 5–15)
BUN: 23 mg/dL — AB (ref 6–20)
CO2: 24 mmol/L (ref 22–32)
CREATININE: 1.84 mg/dL — AB (ref 0.61–1.24)
Calcium: 8.9 mg/dL (ref 8.9–10.3)
Chloride: 108 mmol/L (ref 101–111)
GFR calc Af Amer: 44 mL/min — ABNORMAL LOW (ref >60)
GFR, EST NON AFRICAN AMERICAN: 38 mL/min — AB (ref >60)
GLUCOSE: 81 mg/dL (ref 65–99)
POTASSIUM: 3.8 mmol/L (ref 3.5–5.1)
Sodium: 140 mmol/L (ref 135–145)

## 2016-05-19 MED ORDER — FUROSEMIDE 40 MG PO TABS: ORAL_TABLET | ORAL | 6 refills | Status: DC

## 2016-05-19 MED ORDER — HYDRALAZINE HCL 25 MG PO TABS: 25.0000 mg | ORAL_TABLET | Freq: Three times a day (TID) | ORAL | 6 refills | Status: DC

## 2016-05-19 NOTE — Progress Notes (Signed)
Advanced Heart Failure Clinic Note    Primary Cardiologist: Dr. Johnsie Hamilton  HPI: [name] is a 61 year old male with a past medical history of tachy mediated cardiomyopathy, atrial fibrillation (on Eliquis), systolic CHF (EF 50-93%).   He was first diagnosed with Afib in 02/2016, seen in the Afib clinic and the ED for this. He underwent 2 DC-CV and only held NSR for a few days. He started Amiodarone.   Admitted 04/16/16-04/27/16 with rapid afib and decompensation. He was started on milrinone for marginal mixed venous sat of 57% and optimization with plans for repeat DCCV after loading with IV Amiodarone. Underwent TEE/DCCV on 04/23/16 with restoration of NSR.He was continued on Eliquis for anticoagulation at discharge, as he did have some evidence of LV thrombus on Echo, however it appeared to have resolved when he had a TEE on 04/23/16. Overall diuresed 9.5L and discharge weight was 217 pounds.   He returns today for HF follow up. Overall feeling well. Weight up 6 pounds since appt. 2 weeks ago. Has not been weighing himself at home, he has increased his oral intake as his appetite has returned. Drinking more than 2L a day, eating some high sodium foods but for the most part he eats healthy. He has been walking around the yard, riding on a lawn mower without SOB. He denies SOB with stairs. Has not been taking lasix BID every day, some days he only takes it once a day. Notes that the Imdur we started last visit has caused headaches.   Past Medical History:  Diagnosis Date  . Asthma, persistent   . BPH (benign prostatic hyperplasia) 2014  . Combined hyperlipidemia   . GERD (gastroesophageal reflux disease)   . Hypertension   . 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 Obesity   . Renal stone 04/2012  . Seasonal allergic rhinitis     Current Outpatient Prescriptions  Medication Sig Dispense Refill  . acetaminophen (TYLENOL) 325 MG tablet Take 2 tablets (650 mg total) by mouth every 6 (six)  hours as needed for mild pain or headache.    . albuterol (PROVENTIL HFA;VENTOLIN HFA) 108 (90 Base) MCG/ACT inhaler Inhale 2 puffs into the lungs every 6 (six) hours as needed for wheezing or shortness of breath.    Marland Kitchen amiodarone (PACERONE) 200 MG tablet Take 1 tablet (200 mg total) by mouth daily. 30 tablet 3  . apixaban (ELIQUIS) 5 MG TABS tablet Take 1 tablet (5 mg total) by mouth 2 (two) times daily. 60 tablet 3  . fluticasone furoate-vilanterol (BREO ELLIPTA) 100-25 MCG/INH AEPB Inhale 1 puff into the lungs daily.    . furosemide (LASIX) 40 MG tablet Take 1 tablet (40 mg total) by mouth 2 (two) times daily. 60 tablet 6  . hydrALAZINE (APRESOLINE) 25 MG tablet Take 0.5 tablets (12.5 mg total) by mouth 3 (three) times daily. 45 tablet 3  . isosorbide mononitrate (IMDUR) 30 MG 24 hr tablet Take 1 tablet (30 mg total) by mouth daily. 30 tablet 3  . pantoprazole (PROTONIX) 40 MG tablet Take 40 mg by mouth daily.    . potassium chloride SA (K-DUR,KLOR-CON) 20 MEQ tablet Take 1 tablet (20 mEq total) by mouth 2 (two) times daily. 60 tablet 6  . tamsulosin (FLOMAX) 0.4 MG CAPS capsule Take 0.4 mg by mouth at bedtime.     No current facility-administered medications for this encounter.     Allergies  Allergen Reactions  . Lisinopril Cough  . Digoxin And Related Rash  Social History   Social History  . Marital status: Married    Spouse name: N/A  . Number of children: N/A  . Years of education: N/A   Occupational History  . 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 Smoking status: Never Smoker  . Smokeless tobacco: Never Used  . Alcohol use No  . Drug use: No  . Sexual activity: Not on file   Other Topics Concern  . Not on file   Social History Narrative  . No narrative on file      Family History  Problem Relation Age of Onset  . Hypertension Mother     Vitals:   05/19/16 1415  BP: (!) 180/90  Pulse: (!) 58  SpO2: 97%  Weight: 230 lb (104.3 kg)      PHYSICAL EXAM: General:  Well appearing male in NAD.  HEENT: normal, atraumatic  Neck: supple. no JVD. Carotids 2+ bilat; no bruits. No lymphadenopathy or thyromegaly appreciated. Cor: PMI nondisplaced. Regular rate & rhythm. No rubs, gallops or murmurs. Lungs: clear Abdomen: soft, nontender, nondistended. No hepatosplenomegaly. No bruits or masses. Good bowel sounds. Extremities: no cyanosis, clubbing, rash, edema Neuro: alert & oriented x 3, cranial nerves grossly intact. moves all 4 extremities w/o difficulty. Affect pleasant.  ECG: NSR, rate of 60.    ASSESSMENT & PLAN: 1. Chronic systolic CHF: EF 87-56%.  NICM, felt to be tachymediated. He has no ischemic symptoms, works outside and has never had any chest pain. He does not have a family history of CAD, EKG non ischemic.  - NYHA II - Volume slightly elevated today.  - REDs vest 38%. Increase Lasix to 60mg  in the am and 40mg  in the pm  - No digoxin as it caused a rash - He is intolerant to Imdur, causes headaches, will discontinue.  - HR is 58, will hold off on adding beta blocker.  - No spiro with elevated creatinine.  - No Entresto with CKD. Will check BMET today.  - Echo in June 2018.  - Consider an ischemic evaluation in the future.  2. Paroxsymal atrial fibrillation - EKG today shows NSR with rate of 60.  - Continue amiodarone 200mg  daily.  - TSH 03/2016 - 1.52 3. Hypertension - BP elevated today.  - Will increase his hydralazine to 25mg  TID.  4. History of LV thrombus - No evidence of thrombus on TEE in March 2018 - Continue Eliquis.  5. Chronic kidney disease stage III:  - BMET today.   6. OSA - had sleep study but with unsuccessful CPAP titration. Recommended a full night sleep study with CPAP titration which has been scheduled. - talked with Dr. Landis Hamilton office yesterday, and they are going to reschedule his sleep study.   BMET today. Increase hydralazine and lasix as above. Will have him follow up in 2  weeks in APP clinic and then follow up with Dr. Haroldine Hamilton with an Echo. Research nurses talked with him today about the BEAT - HF study.    Arbutus Leas, NP 05/19/16

## 2016-05-19 NOTE — Patient Instructions (Addendum)
Vest reading today: 38%  EKG today: Normal sinus rhythm  INCREASE Hydralazine to 25 mg (1 whole tab) twice daily.  INCREASE Lasix to 60 mg (1.5 tabs) in am and 40 mg (1 tab) in pm.  Routine lab work today. Will notify you of abnormal results, otherwise no news is good news!  Follow up 2 weeks.  Follow up in 1 month with echocardiogram and appointment with Dr. Haroldine Laws.  Do the following things EVERYDAY: 1) Weigh yourself in the morning before breakfast. Write it down and keep it in a log. 2) Take your medicines as prescribed 3) Eat low salt foods-Limit salt (sodium) to 2000 mg per day.  4) Stay as active as you can everyday 5) Limit all fluids for the day to less than 2 liters

## 2016-05-19 NOTE — Progress Notes (Signed)
Spoke to patient about BeAT-HF Study. Patient viewed BeAT-HF video and information booklet given. Patient agrees with plan for Study Coordinator to call him in a couple of days.

## 2016-05-25 ENCOUNTER — Emergency Department (HOSPITAL_COMMUNITY)
Admission: EM | Admit: 2016-05-25 | Discharge: 2016-05-25 | Disposition: A | Discharge status: Home / Self Care | Payer: PRIVATE HEALTH INSURANCE | Attending: Emergency Medicine | Admitting: Emergency Medicine

## 2016-05-25 ENCOUNTER — Encounter (HOSPITAL_COMMUNITY): Payer: Self-pay | Admitting: Emergency Medicine

## 2016-05-25 ENCOUNTER — Telehealth (HOSPITAL_COMMUNITY): Payer: Self-pay | Admitting: *Deleted

## 2016-05-25 ENCOUNTER — Emergency Department (HOSPITAL_COMMUNITY): Payer: PRIVATE HEALTH INSURANCE

## 2016-05-25 DIAGNOSIS — I5021 Acute systolic (congestive) heart failure: Secondary | ICD-10-CM | POA: Insufficient documentation

## 2016-05-25 DIAGNOSIS — J45909 Unspecified asthma, uncomplicated: Secondary | ICD-10-CM | POA: Insufficient documentation

## 2016-05-25 DIAGNOSIS — Z79899 Other long term (current) drug therapy: Secondary | ICD-10-CM | POA: Insufficient documentation

## 2016-05-25 DIAGNOSIS — R002 Palpitations: Secondary | ICD-10-CM

## 2016-05-25 DIAGNOSIS — I110 Hypertensive heart disease with heart failure: Secondary | ICD-10-CM | POA: Insufficient documentation

## 2016-05-25 LAB — CBC
HEMATOCRIT: 42.7 % (ref 39.0–52.0)
Hemoglobin: 14.3 g/dL (ref 13.0–17.0)
MCH: 29.1 pg (ref 26.0–34.0)
MCHC: 33.5 g/dL (ref 30.0–36.0)
MCV: 86.8 fL (ref 78.0–100.0)
Platelets: 272 10*3/uL (ref 150–400)
RBC: 4.92 MIL/uL (ref 4.22–5.81)
RDW: 14 % (ref 11.5–15.5)
WBC: 6 10*3/uL (ref 4.0–10.5)

## 2016-05-25 LAB — I-STAT TROPONIN, ED
TROPONIN I, POC: 0.01 ng/mL (ref 0.00–0.08)
Troponin i, poc: 0.02 ng/mL (ref 0.00–0.08)

## 2016-05-25 LAB — BASIC METABOLIC PANEL
Anion gap: 8 (ref 5–15)
BUN: 25 mg/dL — AB (ref 6–20)
CHLORIDE: 104 mmol/L (ref 101–111)
CO2: 24 mmol/L (ref 22–32)
Calcium: 9.1 mg/dL (ref 8.9–10.3)
Creatinine, Ser: 2.03 mg/dL — ABNORMAL HIGH (ref 0.61–1.24)
GFR calc Af Amer: 39 mL/min — ABNORMAL LOW (ref >60)
GFR calc non Af Amer: 34 mL/min — ABNORMAL LOW (ref >60)
GLUCOSE: 106 mg/dL — AB (ref 65–99)
POTASSIUM: 3.5 mmol/L (ref 3.5–5.1)
Sodium: 136 mmol/L (ref 135–145)

## 2016-05-25 NOTE — ED Provider Notes (Signed)
[facility] DEPT Provider Note   CSN: [id number] Arrival date & time: 05/25/16  1858     History   Chief Complaint Chief Complaint  Patient presents with  . Palpitations    HPI [name] is a 61 y.o. male.  The history is provided by the patient and medical records.  Palpitations   This is a new problem. Episode onset: this morning. Episode frequency: intermittently. The problem has been resolved. The problem is associated with an unknown factor. Associated symptoms include chest pain (chronic) and irregular heartbeat. Pertinent negatives include no diaphoresis, no fever, no near-syncope, no abdominal pain, no nausea, no vomiting, no headaches, no back pain, no dizziness, no weakness, no cough and no shortness of breath. He has tried nothing for the symptoms. Risk factors include being male and obesity. His past medical history is significant for heart disease.    Past Medical History:  Diagnosis Date  . Asthma, persistent   . BPH (benign prostatic hyperplasia) 2014  . Combined hyperlipidemia   . GERD (gastroesophageal reflux disease)   . Hypertension   . 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 Obesity   . Renal stone 04/2012  . Seasonal allergic rhinitis     Patient Active Problem List   Diagnosis Date Noted  . Acute systolic heart failure ([place]) 04/16/2016    Past Surgical History:  Procedure Laterality Date  . CARDIOVERSION N/A 04/23/2016   Procedure: CARDIOVERSION;  Surgeon: Jolaine Artist, MD;  Location: [facility] ENDOSCOPY;  Service: Cardiovascular;  Laterality: N/A;  . TEE WITHOUT CARDIOVERSION N/A 04/23/2016   Procedure: TRANSESOPHAGEAL ECHOCARDIOGRAM (TEE);  Surgeon: Jolaine Artist, MD;  Location: [facility] ENDOSCOPY;  Service: Cardiovascular;  Laterality: N/A;       Home Medications    Prior to Admission medications   Medication Sig Start Date End Date Taking? Authorizing Provider  acetaminophen (TYLENOL) 325 MG tablet Take 2 tablets (650 mg total)  by mouth every 6 (six) hours as needed for mild pain or headache. 04/27/16   Shirley Friar, PA-C  albuterol (PROVENTIL HFA;VENTOLIN HFA) 108 (90 Base) MCG/ACT inhaler Inhale 2 puffs into the lungs every 6 (six) hours as needed for wheezing or shortness of breath.    Historical Provider, MD  amiodarone (PACERONE) 200 MG tablet Take 1 tablet (200 mg total) by mouth daily. 05/05/16   Arbutus Leas, NP  apixaban (ELIQUIS) 5 MG TABS tablet Take 1 tablet (5 mg total) by mouth 2 (two) times daily. 04/09/16   Sherran Needs, NP  fluticasone furoate-vilanterol (BREO ELLIPTA) 100-25 MCG/INH AEPB Inhale 1 puff into the lungs daily.    Historical Provider, MD  furosemide (LASIX) 40 MG tablet Take 60 mg (1.5 tabs) in am and 40 mg (1 tab) in pm 05/19/16   Arbutus Leas, NP  hydrALAZINE (APRESOLINE) 25 MG tablet Take 1 tablet (25 mg total) by mouth 3 (three) times daily. 05/19/16   Arbutus Leas, NP  pantoprazole (PROTONIX) 40 MG tablet Take 40 mg by mouth daily.    Historical Provider, MD  potassium chloride SA (K-DUR,KLOR-CON) 20 MEQ tablet Take 1 tablet (20 mEq total) by mouth 2 (two) times daily. 04/27/16   Shirley Friar, PA-C  tamsulosin (FLOMAX) 0.4 MG CAPS capsule Take 0.4 mg by mouth at bedtime.    Historical Provider, MD    Family History Family History  Problem Relation Age of Onset  . Hypertension Mother     Social History Social History  Substance Use  Topics  . Smoking status: Never Smoker  . Smokeless tobacco: Never Used  . Alcohol use No     Allergies   Lisinopril and Digoxin and related   Review of Systems Review of Systems  Constitutional: Negative for chills, diaphoresis and fever.  HENT: Negative for ear pain and sore throat.   Eyes: Negative for pain and visual disturbance.  Respiratory: Negative for cough and shortness of breath.   Cardiovascular: Positive for chest pain (chronic) and palpitations. Negative for near-syncope.  Gastrointestinal: Negative for abdominal  pain, nausea and vomiting.  Genitourinary: Negative for dysuria and hematuria.  Musculoskeletal: Negative for arthralgias and back pain.  Skin: Negative for color change and rash.  Neurological: Negative for dizziness, seizures, syncope, weakness and headaches.  All other systems reviewed and are negative.    Physical Exam Updated Vital Signs BP (!) 144/92 (BP Location: Right Arm)   Pulse 76   Resp 17   Ht 6' (1.829 m)   Wt 99.8 kg   SpO2 96%   BMI 29.84 kg/m   Physical Exam  Constitutional: He appears well-developed and well-nourished.  HENT:  Head: Normocephalic and atraumatic.  Eyes: Conjunctivae are normal.  Neck: Neck supple.  Cardiovascular: Normal rate and regular rhythm.   No murmur heard. HR 60s on exam  Pulmonary/Chest: Effort normal and breath sounds normal. No respiratory distress.  Abdominal: Soft. There is no tenderness.  Musculoskeletal: He exhibits no edema.  Neurological: He is alert.  Skin: Skin is warm and dry.  Psychiatric: He has a normal mood and affect.  Nursing note and vitals reviewed.    ED Treatments / Results  Labs (all labs ordered are listed, but only abnormal results are displayed) Labs Reviewed  BASIC METABOLIC PANEL - Abnormal; Notable for the following:       Result Value   Glucose, Bld 106 (*)    BUN 25 (*)    Creatinine, Ser 2.03 (*)    GFR calc non Af Amer 34 (*)    GFR calc Af Amer 39 (*)    All other components within normal limits  CBC  I-STAT TROPOININ, ED    EKG  EKG Interpretation None       Radiology No results found.  Procedures Procedures (including critical care time)  Medications Ordered in ED Medications - No data to display   Initial Impression / Assessment and Plan / ED Course  I have reviewed the triage vital signs and the nursing notes.  Pertinent labs & imaging results that were available during my care of the patient were reviewed by me and considered in my medical decision making (see  chart for details).    Pt with h/o tachy mediated cardiomyopathy, atrial fibrillation (on Amiodarone & Eliquis), systolic CHF (EF 30-86%) presents with "skipped" heartbeats. Takes his HR & BP at home frequently since d/c from the hospital 19mo ago. This morning, thought he felt a few intermittent "skipped" beats; called his cardiologist's office who told him to f/u with his regularly scheduled appointment. He ate dinner, had a loose bowel movement, and then took his pulse again; this time he thought it "skipped" more beats, so he decided to come in for evaluation. Endorses small amount of "low level" chronic left-sided chest pain since d/c that has not worsened; denies F/C, diaphoresis, lightheadedness, syncope, SOB, N/V, leg swelling.  VS & exam as above. EKG: NSR @ 70bpm w/right axis & no signs of ischemia; similar to prior tracings. CXR WNL. Labs remarkable for  Crt 2.03 (appears baseline), troponin 0.02 (last two results from March '18 were 0.05 & 0.04).  Repeat troponin 0.01.   Cardiology consulted by phone & reviewed the Pt's EKG; no concerning findings on the tracing. Believe he is safe for d/c home with his regularly scheduled f/u.  Explained all results to the Pt. Will discharge the Pt home. Recommending follow-up with PCP & Cardiology. ED return precautions provided. Pt acknowledged understanding of, and concurrence with the plan. All questions answered to his satisfaction. In stable condition at the time of discharge.  Final Clinical Impressions(s) / ED Diagnoses   Final diagnoses:  Heart palpitations    New Prescriptions New Prescriptions   No medications on file     Jenny Reichmann, MD 05/25/16 6010    Duffy Bruce, MD 05/27/16 1130

## 2016-05-25 NOTE — ED Notes (Signed)
Pt ambulated pt from B17 around the nurses desk in [place] [facility] and back to room. Pt denies any sob, cp and palpatations

## 2016-05-25 NOTE — ED Triage Notes (Signed)
Pt sts palpitations with hx of afib; pt sts some pain in left side of chest starting today

## 2016-05-25 NOTE — Telephone Encounter (Signed)
Patient called triage line reporting that his HR is in the 60's and he BP has started to come down in the 150's.  Patient said he is feeling great but said when he is checking his pulse he sometimes will skip a beat.    I spoke with Oda Kilts, and he said that as long as his HR is normal and he feels good then he shouldn't be concerned.  But when he comes back in next week for his 2 week follow up we can do another EKG if he wants.  Patient understands and no further questions at this time.

## 2016-05-25 NOTE — ED Notes (Signed)
Patient to X-ray

## 2016-06-02 ENCOUNTER — Encounter (HOSPITAL_COMMUNITY): Payer: Self-pay

## 2016-06-02 ENCOUNTER — Ambulatory Visit (HOSPITAL_COMMUNITY)
Admission: RE | Admit: 2016-06-02 | Discharge: 2016-06-02 | Disposition: A | Discharge status: Home / Self Care | Payer: PRIVATE HEALTH INSURANCE | Source: Ambulatory Visit | Attending: Cardiology | Admitting: Cardiology

## 2016-06-02 VITALS — BP 146/72 | HR 83 | Wt 228.0 lb

## 2016-06-02 DIAGNOSIS — I130 Hypertensive heart and chronic kidney disease with heart failure and stage 1 through stage 4 chronic kidney disease, or unspecified chronic kidney disease: Secondary | ICD-10-CM | POA: Insufficient documentation

## 2016-06-02 DIAGNOSIS — N183 Chronic kidney disease, stage 3 unspecified: Secondary | ICD-10-CM

## 2016-06-02 DIAGNOSIS — I240 Acute coronary thrombosis not resulting in myocardial infarction: Secondary | ICD-10-CM

## 2016-06-02 DIAGNOSIS — I513 Intracardiac thrombosis, not elsewhere classified: Secondary | ICD-10-CM | POA: Diagnosis not present

## 2016-06-02 DIAGNOSIS — I10 Essential (primary) hypertension: Secondary | ICD-10-CM | POA: Diagnosis not present

## 2016-06-02 DIAGNOSIS — G4733 Obstructive sleep apnea (adult) (pediatric): Secondary | ICD-10-CM | POA: Diagnosis not present

## 2016-06-02 DIAGNOSIS — I428 Other cardiomyopathies: Secondary | ICD-10-CM | POA: Diagnosis not present

## 2016-06-02 DIAGNOSIS — I4891 Unspecified atrial fibrillation: Secondary | ICD-10-CM

## 2016-06-02 DIAGNOSIS — Z7901 Long term (current) use of anticoagulants: Secondary | ICD-10-CM | POA: Insufficient documentation

## 2016-06-02 DIAGNOSIS — I5022 Chronic systolic (congestive) heart failure: Secondary | ICD-10-CM | POA: Diagnosis not present

## 2016-06-02 DIAGNOSIS — Z79899 Other long term (current) drug therapy: Secondary | ICD-10-CM | POA: Diagnosis not present

## 2016-06-02 LAB — BASIC METABOLIC PANEL
ANION GAP: 8 (ref 5–15)
BUN: 26 mg/dL — ABNORMAL HIGH (ref 6–20)
CALCIUM: 8.9 mg/dL (ref 8.9–10.3)
CO2: 24 mmol/L (ref 22–32)
CREATININE: 1.85 mg/dL — AB (ref 0.61–1.24)
Chloride: 107 mmol/L (ref 101–111)
GFR, EST AFRICAN AMERICAN: 44 mL/min — AB (ref >60)
GFR, EST NON AFRICAN AMERICAN: 38 mL/min — AB (ref >60)
Glucose, Bld: 90 mg/dL (ref 65–99)
Potassium: 3.5 mmol/L (ref 3.5–5.1)
Sodium: 139 mmol/L (ref 135–145)

## 2016-06-02 MED ORDER — HYDRALAZINE HCL 50 MG PO TABS: 50.0000 mg | ORAL_TABLET | Freq: Three times a day (TID) | ORAL | 6 refills | Status: DC

## 2016-06-02 NOTE — Progress Notes (Signed)
Advanced Heart Failure Clinic Note    Primary Cardiologist: Dr. Johnsie Cancel  HPI: Mr. College is a 61 year old male with a past medical history of tachy mediated cardiomyopathy, atrial fibrillation (on Eliquis), systolic CHF (EF 01-60%).   He was first diagnosed with Afib in 02/2016, seen in the Afib clinic and the ED for this. He underwent 2 DC-CV and only held NSR for a few days. He started Amiodarone.   Admitted 04/16/16-04/27/16 with rapid afib and decompensation. He was started on milrinone for marginal mixed venous sat of 57% and optimization with plans for repeat DCCV after loading with IV Amiodarone. Underwent TEE/DCCV on 04/23/16 with restoration of NSR.He was continued on Eliquis for anticoagulation at discharge, as he did have some evidence of LV thrombus on Echo, however it appeared to have resolved when he had a TEE on 04/23/16. Overall diuresed 9.5L and discharge weight was 217 pounds.   Returns today for HF follow up. He went to the ED on 05/25/16 for palpitations, EKG showed NSR. He notes that his BP has been elevated lately with SBP in the 140-150's. Weights at home 218-221 pounds. Eating some high salt foods - canned soups, hamburgers. Drinking Gatorade daily. Taking all medications as prescribed. Not exercising formally, walking more. Does not get SOB with walking or mowing the lawn.   Past Medical History:  Diagnosis Date  . Asthma, persistent   . BPH (benign prostatic hyperplasia) 2014  . Combined hyperlipidemia   . GERD (gastroesophageal reflux disease)   . Hypertension   . 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 Obesity   . Renal stone 04/2012  . Seasonal allergic rhinitis     Current Outpatient Prescriptions  Medication Sig Dispense Refill  . acetaminophen (TYLENOL) 325 MG tablet Take 2 tablets (650 mg total) by mouth every 6 (six) hours as needed for mild pain or headache.    . albuterol (PROVENTIL HFA;VENTOLIN HFA) 108 (90 Base) MCG/ACT inhaler Inhale 2 puffs into  the lungs every 6 (six) hours as needed for wheezing or shortness of breath.    Marland Kitchen amiodarone (PACERONE) 200 MG tablet Take 1 tablet (200 mg total) by mouth daily. 30 tablet 3  . apixaban (ELIQUIS) 5 MG TABS tablet Take 1 tablet (5 mg total) by mouth 2 (two) times daily. 60 tablet 3  . fluticasone furoate-vilanterol (BREO ELLIPTA) 100-25 MCG/INH AEPB Inhale 1 puff into the lungs daily.    . furosemide (LASIX) 40 MG tablet Take 60 mg (1.5 tabs) in am and 40 mg (1 tab) in pm (Patient taking differently: Take 40-60 mg by mouth See admin instructions. 60 mg in the morning and 40 mg in the evening) 75 tablet 6  . hydrALAZINE (APRESOLINE) 25 MG tablet Take 1 tablet (25 mg total) by mouth 3 (three) times daily. 90 tablet 6  . pantoprazole (PROTONIX) 40 MG tablet Take 40 mg by mouth daily.    . potassium chloride SA (K-DUR,KLOR-CON) 20 MEQ tablet Take 1 tablet (20 mEq total) by mouth 2 (two) times daily. 60 tablet 6  . tamsulosin (FLOMAX) 0.4 MG CAPS capsule Take 0.4 mg by mouth at bedtime.     No current facility-administered medications for this encounter.     Allergies  Allergen Reactions  . Isosorbide Other (See Comments)    HEADACHES from Imdur  . Lisinopril Cough  . Digoxin And Related Rash      Social History   Social History  . Marital status: Married    Spouse  name: N/A  . Number of children: N/A  . Years of education: N/A   Occupational History  . 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 Smoking status: Never Smoker  . Smokeless tobacco: Never Used  . Alcohol use No  . Drug use: No  . Sexual activity: Not on file   Other Topics Concern  . Not on file   Social History Narrative  . No narrative on file      Family History  Problem Relation Age of Onset  . Hypertension Mother     Vitals:   06/02/16 1500  BP: (!) 146/72  Pulse: 83  SpO2: 98%  Weight: 228 lb (103.4 kg)     PHYSICAL EXAM: General:  Well appearing male, NAD.  HEENT: Normal,  atraumatic.  Neck: supple. No JVD. Carotids 2+ bilat; no bruits. No lymphadenopathy or thyromegaly appreciated. Cor: PMI nondisplaced Regular rate and rhythm No rubs, gallops or murmurs. Lungs: Clear in all lobes bilaterally, normal effort. No wheezing.  Abdomen: soft, nontender, nondistended. No hepatosplenomegaly. No bruits or masses. Good bowel sounds.  Extremities: no cyanosis, clubbing, rash. Warm. No edema.  Neuro: alert & oriented x 3, cranial nerves grossly intact. moves all 4 extremities w/o difficulty. Affect pleasant.  ECG: Sinus brady HR 53.    ASSESSMENT & PLAN: 1. Chronic systolic CHF: EF 26-71%.  NICM, felt to be tachymediated. He has no ischemic symptoms, works outside and has never had any chest pain. He does not have a family history of CAD, EKG non ischemic.  - NYHA II - Volume status stable today.  - Continue Lasix to 60mg  in the am and 40mg  in the pm  - No digoxin as it caused a rash - He is intolerant to Imdur, causes headaches - HR is 53, will hold off on adding beta blocker.  - No spiro with elevated creatinine.  - No Entresto with CKD. Will check BMET today. - Increase hydralazine to 50mg  TID.   - Echo in June 2018.  - Consider an ischemic evaluation in the future.  2. Paroxsymal atrial fibrillation - EKG today - Sinus brady 53.  - Continue amiodarone 200mg  daily.  - TSH 03/2016 - 1.52 3. Hypertension - BP elevated today.  - Will increase his hydralazine to 50mg  TID.  4. History of LV thrombus - No evidence of thrombus on TEE in March 2018 - Continue Eliquis.  5. Chronic kidney disease stage III:  - BMET today.   6. OSA - Needs repeat sleep study, scheduled for June 5th.    BMET today, follow up in 4 weeks. Will follow up with Dr. Haroldine Laws in 6 weeks with an Echo.    Arbutus Leas, NP 06/02/16

## 2016-06-02 NOTE — Patient Instructions (Signed)
INCREASE Hydralazine to 50 mg three times daily. Can double up on your current 25 mg tablets (Take 2 tabs three times daily). New Rx has been sent to your pharmacy for 50 mg tablets (Take 1 tab three times daily).  Routine lab work today. Will notify you of abnormal results, otherwise no news is good news!  Follow up 4 weeks.  Do the following things EVERYDAY: 1) Weigh yourself in the morning before breakfast. Write it down and keep it in a log. 2) Take your medicines as prescribed 3) Eat low salt foods-Limit salt (sodium) to 2000 mg per day.  4) Stay as active as you can everyday 5) Limit all fluids for the day to less than 2 liters

## 2016-06-02 NOTE — Telephone Encounter (Addendum)
Cpap titration scheduled for Tuesday June 29 2016. Patient notified

## 2016-06-22 ENCOUNTER — Ambulatory Visit (HOSPITAL_COMMUNITY)
Admission: RE | Admit: 2016-06-22 | Discharge: 2016-06-22 | Disposition: A | Discharge status: Home / Self Care | Payer: No Typology Code available for payment source | Source: Ambulatory Visit | Attending: Internal Medicine | Admitting: Internal Medicine

## 2016-06-22 DIAGNOSIS — I509 Heart failure, unspecified: Secondary | ICD-10-CM | POA: Insufficient documentation

## 2016-06-22 DIAGNOSIS — R001 Bradycardia, unspecified: Secondary | ICD-10-CM | POA: Insufficient documentation

## 2016-06-25 ENCOUNTER — Telehealth (HOSPITAL_COMMUNITY): Payer: Self-pay

## 2016-06-25 MED ORDER — SACUBITRIL-VALSARTAN 24-26 MG PO TABS: 1.0000 | ORAL_TABLET | Freq: Two times a day (BID) | ORAL | 6 refills | Status: DC

## 2016-06-25 NOTE — Telephone Encounter (Signed)
Effie Berkshire, RN  Arbutus Leas, NP  Cc: Kennieth Rad, RN        Left another VM today, will forward to Susie to follow up on Monday if he does not call back.  Will go ahead and send Rx to listed primary pharmacy in chart.   Previous Messages    ----- Message -----  From: Arbutus Leas, NP  Sent: 06/24/2016  3:58 PM  To: Effie Berkshire, RN  Subject: Start Delene Loll for [name]          Please call Mr. Tuzzolino and tell him that I spoke with Dr. Haroldine Laws and he would like for him to go ahead and start Entreso 24/26mg  BID. Can give him 30 day free card. Keep appt. With me next week.   Thank you.

## 2016-06-25 NOTE — Addendum Note (Signed)
Addendum  created 06/25/16 1312 by Finnlee Silvernail, MD   Sign clinical note    

## 2016-06-25 NOTE — Telephone Encounter (Signed)
Patient aware and agreeable to start Entresto 24/26 mg tablet twice daily. No questions/cocners, reminded of next week's apt.  Renee Pain, RN

## 2016-06-29 ENCOUNTER — Encounter (HOSPITAL_BASED_OUTPATIENT_CLINIC_OR_DEPARTMENT_OTHER): Payer: PRIVATE HEALTH INSURANCE

## 2016-07-01 ENCOUNTER — Other Ambulatory Visit (HOSPITAL_COMMUNITY): Payer: Self-pay | Admitting: *Deleted

## 2016-07-01 ENCOUNTER — Ambulatory Visit (HOSPITAL_COMMUNITY)
Admission: RE | Admit: 2016-07-01 | Discharge: 2016-07-01 | Disposition: A | Discharge status: Home / Self Care | Payer: PRIVATE HEALTH INSURANCE | Source: Ambulatory Visit | Attending: Internal Medicine | Admitting: Internal Medicine

## 2016-07-01 VITALS — BP 140/80 | HR 77 | Wt 227.0 lb

## 2016-07-01 DIAGNOSIS — E782 Mixed hyperlipidemia: Secondary | ICD-10-CM | POA: Insufficient documentation

## 2016-07-01 DIAGNOSIS — N183 Chronic kidney disease, stage 3 unspecified: Secondary | ICD-10-CM

## 2016-07-01 DIAGNOSIS — R51 Headache: Secondary | ICD-10-CM | POA: Insufficient documentation

## 2016-07-01 DIAGNOSIS — G4733 Obstructive sleep apnea (adult) (pediatric): Secondary | ICD-10-CM | POA: Diagnosis not present

## 2016-07-01 DIAGNOSIS — I10 Essential (primary) hypertension: Secondary | ICD-10-CM | POA: Diagnosis not present

## 2016-07-01 DIAGNOSIS — I513 Intracardiac thrombosis, not elsewhere classified: Secondary | ICD-10-CM | POA: Diagnosis not present

## 2016-07-01 DIAGNOSIS — I130 Hypertensive heart and chronic kidney disease with heart failure and stage 1 through stage 4 chronic kidney disease, or unspecified chronic kidney disease: Secondary | ICD-10-CM | POA: Insufficient documentation

## 2016-07-01 DIAGNOSIS — I240 Acute coronary thrombosis not resulting in myocardial infarction: Secondary | ICD-10-CM

## 2016-07-01 DIAGNOSIS — I5022 Chronic systolic (congestive) heart failure: Secondary | ICD-10-CM | POA: Insufficient documentation

## 2016-07-01 DIAGNOSIS — R0602 Shortness of breath: Secondary | ICD-10-CM | POA: Diagnosis not present

## 2016-07-01 DIAGNOSIS — I4891 Unspecified atrial fibrillation: Secondary | ICD-10-CM | POA: Insufficient documentation

## 2016-07-01 DIAGNOSIS — R21 Rash and other nonspecific skin eruption: Secondary | ICD-10-CM | POA: Insufficient documentation

## 2016-07-01 DIAGNOSIS — I429 Cardiomyopathy, unspecified: Secondary | ICD-10-CM | POA: Insufficient documentation

## 2016-07-01 DIAGNOSIS — Z888 Allergy status to other drugs, medicaments and biological substances status: Secondary | ICD-10-CM | POA: Insufficient documentation

## 2016-07-01 DIAGNOSIS — E669 Obesity, unspecified: Secondary | ICD-10-CM | POA: Insufficient documentation

## 2016-07-01 DIAGNOSIS — K219 Gastro-esophageal reflux disease without esophagitis: Secondary | ICD-10-CM | POA: Insufficient documentation

## 2016-07-01 DIAGNOSIS — I5021 Acute systolic (congestive) heart failure: Secondary | ICD-10-CM

## 2016-07-01 DIAGNOSIS — Z79899 Other long term (current) drug therapy: Secondary | ICD-10-CM | POA: Insufficient documentation

## 2016-07-01 DIAGNOSIS — Z8249 Family history of ischemic heart disease and other diseases of the circulatory system: Secondary | ICD-10-CM | POA: Insufficient documentation

## 2016-07-01 DIAGNOSIS — N400 Enlarged prostate without lower urinary tract symptoms: Secondary | ICD-10-CM | POA: Insufficient documentation

## 2016-07-01 DIAGNOSIS — Z7901 Long term (current) use of anticoagulants: Secondary | ICD-10-CM | POA: Insufficient documentation

## 2016-07-01 DIAGNOSIS — Z87442 Personal history of urinary calculi: Secondary | ICD-10-CM | POA: Insufficient documentation

## 2016-07-01 DIAGNOSIS — J45909 Unspecified asthma, uncomplicated: Secondary | ICD-10-CM | POA: Insufficient documentation

## 2016-07-01 LAB — BASIC METABOLIC PANEL
Anion gap: 8 (ref 5–15)
BUN: 21 mg/dL — ABNORMAL HIGH (ref 6–20)
CALCIUM: 8.8 mg/dL — AB (ref 8.9–10.3)
CO2: 25 mmol/L (ref 22–32)
CREATININE: 1.94 mg/dL — AB (ref 0.61–1.24)
Chloride: 106 mmol/L (ref 101–111)
GFR calc Af Amer: 41 mL/min — ABNORMAL LOW (ref >60)
GFR calc non Af Amer: 36 mL/min — ABNORMAL LOW (ref >60)
GLUCOSE: 88 mg/dL (ref 65–99)
Potassium: 3.4 mmol/L — ABNORMAL LOW (ref 3.5–5.1)
Sodium: 139 mmol/L (ref 135–145)

## 2016-07-01 MED ORDER — APIXABAN 5 MG PO TABS: 5.0000 mg | ORAL_TABLET | Freq: Two times a day (BID) | ORAL | 3 refills | Status: DC

## 2016-07-01 MED ORDER — SACUBITRIL-VALSARTAN 24-26 MG PO TABS: 1.0000 | ORAL_TABLET | Freq: Two times a day (BID) | ORAL | 11 refills | Status: DC

## 2016-07-01 NOTE — Patient Instructions (Signed)
Keep Follow up appointment

## 2016-07-01 NOTE — Progress Notes (Signed)
Advanced Heart Failure Medication Review by a Pharmacist  Does the patient  feel that his/her medications are working for him/her?  yes  Has the patient been experiencing any side effects to the medications prescribed?  no  Does the patient measure his/her own blood pressure or blood glucose at home?  yes   Does the patient have any problems obtaining medications due to transportation or finances?   yes  Understanding of regimen: good Understanding of indications: good Potential of compliance: excellent Patient understands to avoid NSAIDs. Patient understands to avoid decongestants.  Issues to address at subsequent visits: PAP status for eliquis and entresto, insurance status.   Pharmacist comments: Patient presents without pill bottles or med list but has good understanding of regimen. Patient reports that the "pounding" in his chest has greatly improved since starting Entresto. Patient's home BP/HR log with improved with SBP to 130s, HR in 60s. Patient denies side effects. Per patient, entresto and eliquis are expensive and does not have health insurance. Referred patient CSW and provided patient with PAP for both drugs.    Time with patient: 12 mins  Preparation and documentation time: 5 mins Total time: 17 mins  Carlean Jews, Pharm.D. PGY1 Pharmacy Resident 6/7/20183:54 PM Pager [phone]

## 2016-07-01 NOTE — Progress Notes (Signed)
Advanced Heart Failure Clinic Note    Primary Cardiologist: Dr. Johnsie Cancel  HPI: [name] is a 61 year old male with a past medical history of tachy mediated cardiomyopathy, atrial fibrillation (on Eliquis), systolic CHF (EF 34-19%).   He was first diagnosed with Afib in 02/2016, seen in the Afib clinic and the ED for this. He underwent 2 DC-CV and only held NSR for a few days. He started Amiodarone.   Admitted 04/16/16-04/27/16 with rapid afib and decompensation. He was started on milrinone for marginal mixed venous sat of 57% and optimization with plans for repeat DCCV after loading with IV Amiodarone. Underwent TEE/DCCV on 04/23/16 with restoration of NSR.He was continued on Eliquis for anticoagulation at discharge, as he did have some evidence of LV thrombus on Echo, however it appeared to have resolved when he had a TEE on 04/23/16. Overall diuresed 9.5L and discharge weight was 217 pounds.   He returns today for HF follow up. Says he feels like a new person since starting the [place]. Breathing much better, has more energy. Weights at home 225-227. Taking all medications as prescribed. Eating low sodium foods, drinking less than 2L a day. He has stopped drinking gatorade. He is working in the yard without SOB.   Past Medical History:  Diagnosis Date  . Asthma, persistent   . BPH (benign prostatic hyperplasia) 2014  . Combined hyperlipidemia   . GERD (gastroesophageal reflux disease)   . Hypertension   . 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 Obesity   . Renal stone 04/2012  . Seasonal allergic rhinitis     Current Outpatient Prescriptions  Medication Sig Dispense Refill  . acetaminophen (TYLENOL) 325 MG tablet Take 2 tablets (650 mg total) by mouth every 6 (six) hours as needed for mild pain or headache.    . albuterol (PROVENTIL HFA;VENTOLIN HFA) 108 (90 Base) MCG/ACT inhaler Inhale 2 puffs into the lungs every 6 (six) hours as needed for wheezing or shortness of breath.    Marland Kitchen  amiodarone (PACERONE) 200 MG tablet Take 1 tablet (200 mg total) by mouth daily. 30 tablet 3  . apixaban (ELIQUIS) 5 MG TABS tablet Take 1 tablet (5 mg total) by mouth 2 (two) times daily. 60 tablet 3  . fluticasone furoate-vilanterol (BREO ELLIPTA) 100-25 MCG/INH AEPB Inhale 1 puff into the lungs daily.    . furosemide (LASIX) 40 MG tablet Take 60 mg (1.5 tabs) in am and 40 mg (1 tab) in pm (Patient taking differently: Take 40-60 mg by mouth See admin instructions. 60 mg in the morning and 40 mg in the evening) 75 tablet 6  . hydrALAZINE (APRESOLINE) 50 MG tablet Take 1 tablet (50 mg total) by mouth 3 (three) times daily. 90 tablet 6  . pantoprazole (PROTONIX) 40 MG tablet Take 40 mg by mouth daily.    . potassium chloride SA (K-DUR,KLOR-CON) 20 MEQ tablet Take 1 tablet (20 mEq total) by mouth 2 (two) times daily. 60 tablet 6  . sacubitril-valsartan (ENTRESTO) 24-26 MG Take 1 tablet by mouth 2 (two) times daily. [email or web address] for free 30 day trial. 60 tablet 6  . tamsulosin (FLOMAX) 0.4 MG CAPS capsule Take 0.4 mg by mouth at bedtime.     No current facility-administered medications for this encounter.     Allergies  Allergen Reactions  . Isosorbide Other (See Comments)    HEADACHES from Imdur  . Lisinopril Cough  . Digoxin And Related Rash      Social History  Social History  . Marital status: Married    Spouse name: N/A  . Number of children: N/A  . Years of education: N/A   Occupational History  . 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 Smoking status: Never Smoker  . Smokeless tobacco: Never Used  . Alcohol use No  . Drug use: No  . Sexual activity: Not on file   Other Topics Concern  . Not on file   Social History Narrative  . No narrative on file      Family History  Problem Relation Age of Onset  . Hypertension Mother     Vitals:   07/01/16 1538  BP: 140/80  Pulse: 77  SpO2: 95%  Weight: 227 lb (103 kg)      PHYSICAL EXAM: General:  Well appearing male, NAD.   HEENT: Normal, atraumatic.  Neck: supple. No JVD. Carotids 2+ bilat; no bruits. No lymphadenopathy or thyromegaly appreciated. Cor: PMI nondisplaced Regular rate and rhythm, bradycardic. No rubs, gallops or murmurs. Lungs: Clear bilaterally, normal effort. No wheezing.  Abdomen: soft, nontender, nondistended. No hepatosplenomegaly. No bruits or masses. + bowel sounds  Extremities: no cyanosis, clubbing, rash. Warm, no edema.  Neuro: alert & oriented x 3, cranial nerves grossly intact. moves all 4 extremities w/o difficulty. Affect pleasant.  ASSESSMENT & PLAN: 1. Chronic systolic CHF: EF 90-30%.  NICM, felt to be tachymediated. He has no ischemic symptoms, works outside and has never had any chest pain. He does not have a family history of CAD, EKG non ischemic.  - NYHA II - Volume status stable on exam.  - Continue lasix 60/40mg . If he develops dizziness or orthostatic symptoms I have instructed him to reduce Lasix to 40mg  BID.  - No digoxin as it caused a rash - Intolerant to Imdur, caused headaches.  - No beta blocker with bradycardia.  - Started Entresto 24/26 mg BID last week, will check BMET today. He feels great since starting this. Can increase if BMET stable.  - Continue hydralazine 50mg  TID.   - Will see Dr. Haroldine Laws in 2 weeks with an Echo.  2. Paroxsymal atrial fibrillation - NSR, sinus brady today.  - Continue amiodarone 200mg  daily.  - TSH 03/2016 - 1.52 3. Hypertension - Improved with Entresto.  4. History of LV thrombus - No evidence of thrombus on TEE in March 2018 - Continue Eliquis.  5. Chronic kidney disease stage III:  - BMET today.  6. OSA - He has scheduled sleep study.    Follow up as scheduled with Dr. Peri Maris, NP 07/01/16

## 2016-07-05 ENCOUNTER — Telehealth: Payer: Self-pay

## 2016-07-05 NOTE — Telephone Encounter (Signed)
I CALLED PATIENT  TO VERIFY INSURANCE PATIENT STATED HE DID HAVE CURRENT INSURANCE. I TOLD HIM I WILL CALL TO CHECK TO SEE IF IT REQUIRES PRECERT FOR HIS SLEEP STUDY & ECHO PT AGREED. AFTER CALLING INSURANCE COMPANIES I FOUND OUT- * ECHO CPT 93306 & SLEEP STUDY CPT  [phone] -Patient has two [place] is only a accidental supp policy so it will not work for sleep study or echo or office visit. Id# [id number] . The other ploicy EBIX  id# [id number] TERMED   06/03/2016 so patient is self pay. I called patient BACK  is is aware he is self pay and stated he was going to give insurance companies a call

## 2016-07-07 NOTE — Telephone Encounter (Signed)
Late entry:  Per Lubertha Basque (pre-cert) patient is aware he is self pay.  Charlean Sanfilippo, CMA        Patient has two plans the life preferred plan is only a accidental supp policy so it will not work for sleep study or echo or office visit. Id# [id number] . The other ploicy ebis id# [id number] expired 06/03/2016 so patient is self pay. I called patient is is aware he is self pay

## 2016-07-08 ENCOUNTER — Telehealth: Payer: Self-pay | Admitting: Licensed Clinical Social Worker

## 2016-07-08 NOTE — Telephone Encounter (Signed)
CSW contacted patient per request to assist with insurance. 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 Patient requesting assistance with options for insurance. CSW referred patient to [place] program and to explore AHA in the Fall during open enrollment. Patient verbalizes understanding and will follow up as needed. Raquel Sarna, [place], [place]

## 2016-07-12 ENCOUNTER — Ambulatory Visit (HOSPITAL_BASED_OUTPATIENT_CLINIC_OR_DEPARTMENT_OTHER)
Admission: RE | Admit: 2016-07-12 | Discharge: 2016-07-12 | Disposition: A | Discharge status: Home / Self Care | Payer: Self-pay | Source: Ambulatory Visit | Attending: Internal Medicine | Admitting: Internal Medicine

## 2016-07-12 ENCOUNTER — Ambulatory Visit (HOSPITAL_COMMUNITY)
Admission: RE | Admit: 2016-07-12 | Discharge: 2016-07-12 | Disposition: A | Discharge status: Home / Self Care | Payer: Self-pay | Source: Ambulatory Visit | Attending: Internal Medicine | Admitting: Internal Medicine

## 2016-07-12 ENCOUNTER — Encounter (HOSPITAL_COMMUNITY): Payer: Self-pay | Admitting: Internal Medicine

## 2016-07-12 VITALS — BP 122/78 | HR 58 | Wt 224.5 lb

## 2016-07-12 DIAGNOSIS — Z7901 Long term (current) use of anticoagulants: Secondary | ICD-10-CM | POA: Insufficient documentation

## 2016-07-12 DIAGNOSIS — J45909 Unspecified asthma, uncomplicated: Secondary | ICD-10-CM | POA: Insufficient documentation

## 2016-07-12 DIAGNOSIS — N183 Chronic kidney disease, stage 3 unspecified: Secondary | ICD-10-CM

## 2016-07-12 DIAGNOSIS — Z79899 Other long term (current) drug therapy: Secondary | ICD-10-CM | POA: Insufficient documentation

## 2016-07-12 DIAGNOSIS — G4733 Obstructive sleep apnea (adult) (pediatric): Secondary | ICD-10-CM

## 2016-07-12 DIAGNOSIS — I429 Cardiomyopathy, unspecified: Secondary | ICD-10-CM | POA: Insufficient documentation

## 2016-07-12 DIAGNOSIS — I4891 Unspecified atrial fibrillation: Secondary | ICD-10-CM | POA: Insufficient documentation

## 2016-07-12 DIAGNOSIS — Z8249 Family history of ischemic heart disease and other diseases of the circulatory system: Secondary | ICD-10-CM | POA: Insufficient documentation

## 2016-07-12 DIAGNOSIS — I130 Hypertensive heart and chronic kidney disease with heart failure and stage 1 through stage 4 chronic kidney disease, or unspecified chronic kidney disease: Secondary | ICD-10-CM | POA: Insufficient documentation

## 2016-07-12 DIAGNOSIS — R51 Headache: Secondary | ICD-10-CM | POA: Insufficient documentation

## 2016-07-12 DIAGNOSIS — E669 Obesity, unspecified: Secondary | ICD-10-CM | POA: Insufficient documentation

## 2016-07-12 DIAGNOSIS — I5022 Chronic systolic (congestive) heart failure: Secondary | ICD-10-CM

## 2016-07-12 DIAGNOSIS — R21 Rash and other nonspecific skin eruption: Secondary | ICD-10-CM | POA: Insufficient documentation

## 2016-07-12 DIAGNOSIS — I480 Paroxysmal atrial fibrillation: Secondary | ICD-10-CM

## 2016-07-12 DIAGNOSIS — Z87442 Personal history of urinary calculi: Secondary | ICD-10-CM | POA: Insufficient documentation

## 2016-07-12 DIAGNOSIS — R42 Dizziness and giddiness: Secondary | ICD-10-CM | POA: Insufficient documentation

## 2016-07-12 DIAGNOSIS — K219 Gastro-esophageal reflux disease without esophagitis: Secondary | ICD-10-CM | POA: Insufficient documentation

## 2016-07-12 DIAGNOSIS — I10 Essential (primary) hypertension: Secondary | ICD-10-CM

## 2016-07-12 DIAGNOSIS — E782 Mixed hyperlipidemia: Secondary | ICD-10-CM | POA: Insufficient documentation

## 2016-07-12 DIAGNOSIS — N400 Enlarged prostate without lower urinary tract symptoms: Secondary | ICD-10-CM | POA: Insufficient documentation

## 2016-07-12 DIAGNOSIS — R000 Tachycardia, unspecified: Secondary | ICD-10-CM | POA: Insufficient documentation

## 2016-07-12 LAB — ECHOCARDIOGRAM COMPLETE
AVLVOTPG: 5 mmHg
E/e' ratio: 3.9
EWDT: 401 ms
FS: 28 % (ref 28–44)
IV/PV OW: 1.12
LA diam end sys: 45 mm
LA vol A4C: 90.9 ml
LA vol index: 37.4 mL/m2
LA vol: 84.1 mL
LADIAMINDEX: 2 cm/m2
LASIZE: 45 mm
LV E/e' medial: 3.9
LV E/e'average: 3.9
LV PW d: 8.31 mm — AB (ref 0.6–1.1)
LV TDI E'LATERAL: 12.5
LV TDI E'MEDIAL: 5.98
LV e' LATERAL: 12.5 cm/s
LVDIAVOL: 163 mL — AB (ref 62–150)
LVDIAVOLIN: 72 mL/m2
LVOT SV: 112 mL
LVOT VTI: 24.7 cm
LVOT area: 4.52 cm2
LVOT diameter: 24 mm
LVOT peak vel: 110 cm/s
LVSYSVOL: 82 mL — AB (ref 21–61)
LVSYSVOLIN: 37 mL/m2
MV Dec: 401
MVPKAVEL: 77.1 m/s
MVPKEVEL: 48.8 m/s
RV LATERAL S' VELOCITY: 15.7 cm/s
Simpson's disk: 50
Stroke v: 81 ml
TAPSE: 25.9 mm

## 2016-07-12 LAB — BASIC METABOLIC PANEL
ANION GAP: 8 (ref 5–15)
BUN: 21 mg/dL — ABNORMAL HIGH (ref 6–20)
CO2: 25 mmol/L (ref 22–32)
Calcium: 9.1 mg/dL (ref 8.9–10.3)
Chloride: 107 mmol/L (ref 101–111)
Creatinine, Ser: 1.88 mg/dL — ABNORMAL HIGH (ref 0.61–1.24)
GFR calc non Af Amer: 37 mL/min — ABNORMAL LOW (ref >60)
GFR, EST AFRICAN AMERICAN: 43 mL/min — AB (ref >60)
GLUCOSE: 89 mg/dL (ref 65–99)
POTASSIUM: 3.4 mmol/L — AB (ref 3.5–5.1)
Sodium: 140 mmol/L (ref 135–145)

## 2016-07-12 MED ORDER — SACUBITRIL-VALSARTAN 49-51 MG PO TABS: 1.0000 | ORAL_TABLET | Freq: Two times a day (BID) | ORAL | 3 refills | Status: DC

## 2016-07-12 MED ORDER — FUROSEMIDE 40 MG PO TABS: 40.0000 mg | ORAL_TABLET | Freq: Every day | ORAL | 3 refills | Status: DC

## 2016-07-12 NOTE — Patient Instructions (Signed)
Labs today (will call for abnormal results, otherwise no news is good news)  DECREASE Lasix to 40 mg (1 Tablet) Once Daily  INCREASE Entresto to 49-51mg  (1 Tablet) Twice Daily  We will call you after your lab results to discuss the Heart Catherterization   Follow up in 2 Months

## 2016-07-12 NOTE — Progress Notes (Signed)
Advanced Heart Failure Clinic Note    Primary Cardiologist: Dr. Johnsie Cancel  HPI: [name] is a 61 year old male with a past medical history of tachy mediated cardiomyopathy, atrial fibrillation (on Eliquis), systolic CHF (EF 91-47%).   He was first diagnosed with Afib in 02/2016, seen in the Afib clinic and the ED for this. He underwent 2 DC-CV and only held NSR for a few days. He started Amiodarone.   Admitted 04/16/16-04/27/16 with rapid afib and decompensation. He was started on milrinone for marginal mixed venous sat of 57% and optimization with plans for repeat DCCV after loading with IV Amiodarone. Underwent TEE/DCCV on 04/23/16 with restoration of NSR.He was continued on Eliquis for anticoagulation at discharge, as he did have some evidence of LV thrombus on Echo, however it appeared to have resolved when he had a TEE on 04/23/16. Overall diuresed 9.5L and discharge weight was 217 pounds.   Was seen in HF-NP clinic 2 weeks ago. Was doing very well on Entresto 24-26 bid. Lasix cut abck to 40 bid due to dizziness.   He returns today for HF follow up. Feels very good. Getting around better. Mowing the yard with riding mower. Watching diet closely. Low sodium. Watching fluid intake. Weight stable 221-225. SBP 130-140 range. No edema, orthopnea or PND. Occasional dizziness when standing.  Echo today: EF 40-45% RV mildly HK  Personally reviewed    Past Medical History:  Diagnosis Date  . Asthma, persistent   . BPH (benign prostatic hyperplasia) 2014  . Combined hyperlipidemia   . GERD (gastroesophageal reflux disease)   . Hypertension   . 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 Obesity   . Renal stone 04/2012  . Seasonal allergic rhinitis     Current Outpatient Prescriptions  Medication Sig Dispense Refill  . acetaminophen (TYLENOL) 325 MG tablet Take 2 tablets (650 mg total) by mouth every 6 (six) hours as needed for mild pain or headache.    . albuterol (PROVENTIL HFA;VENTOLIN  HFA) 108 (90 Base) MCG/ACT inhaler Inhale 2 puffs into the lungs every 6 (six) hours as needed for wheezing or shortness of breath.    Marland Kitchen amiodarone (PACERONE) 200 MG tablet Take 1 tablet (200 mg total) by mouth daily. 30 tablet 3  . apixaban (ELIQUIS) 5 MG TABS tablet Take 1 tablet (5 mg total) by mouth 2 (two) times daily. 180 tablet 3  . fluticasone furoate-vilanterol (BREO ELLIPTA) 100-25 MCG/INH AEPB Inhale 1 puff into the lungs daily.    . furosemide (LASIX) 40 MG tablet Take 40 mg by mouth 2 (two) times daily.    . hydrALAZINE (APRESOLINE) 50 MG tablet Take 1 tablet (50 mg total) by mouth 3 (three) times daily. 90 tablet 6  . pantoprazole (PROTONIX) 40 MG tablet Take 40 mg by mouth daily.    . potassium chloride SA (K-DUR,KLOR-CON) 20 MEQ tablet Take 1 tablet (20 mEq total) by mouth 2 (two) times daily. 60 tablet 6  . sacubitril-valsartan (ENTRESTO) 24-26 MG Take 1 tablet by mouth 2 (two) times daily. 60 tablet 11  . tamsulosin (FLOMAX) 0.4 MG CAPS capsule Take 0.4 mg by mouth at bedtime.     No current facility-administered medications for this encounter.     Allergies  Allergen Reactions  . Isosorbide Other (See Comments)    HEADACHES from Imdur  . Lisinopril Cough  . Digoxin And Related Rash      Social History   Social History  . Marital status: Married  Spouse name: N/A  . Number of children: N/A  . Years of education: N/A   Occupational History  . 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 Smoking status: Never Smoker  . Smokeless tobacco: Never Used  . Alcohol use No  . Drug use: No  . Sexual activity: Not on file   Other Topics Concern  . Not on file   Social History Narrative  . No narrative on file      Family History  Problem Relation Age of Onset  . Hypertension Mother     Vitals:   07/12/16 1434  BP: 122/78  Pulse: (!) 58  SpO2: 98%  Weight: 224 lb 8 oz (101.8 kg)     PHYSICAL EXAM: General:  Well appearing. No resp  difficulty HEENT: normal Neck: supple. JVP 5. Carotids 2+ bilat; no bruits. No lymphadenopathy or thryomegaly appreciated. Cor: PMI nondisplaced. Regular rate & rhythm. No rubs, gallops or murmurs. Lungs: clear Abdomen: soft, nontender, nondistended. No hepatosplenomegaly. No bruits or masses. Good bowel sounds. Extremities: no cyanosis, clubbing, rash, trace edema on left Neuro: alert & orientedx3, cranial nerves grossly intact. moves all 4 extremities w/o difficulty. Affect pleasant   ASSESSMENT & PLAN: 1. Chronic systolic CHF: EF 34-28%.  NICM, felt to be tachy mediated. ? AF. Echo today (viewed personally)  EF ~40-45% with RV mild HK  - NYHA II - Volume status stable on exam.  - Increased Entresto to 49/51 bid - No digoxin as it caused a rash - Intolerant to Imdur, caused headaches.  - No beta blocker with bradycardia. - Continue hydralazine 50mg  TID.   - Echo today with improved EF but still ~ 40%. Will plan coronary angio (with limited dye) if Cr < 2.0 2. Paroxsymal atrial fibrillation - Remains in sinus brady - Continue amiodarone 200mg  daily.  - Continue Eliquis - TSH 03/2016 - 1.52 3. Hypertension - BP still mildly elevated. Increasing Entresto.  4. History of LV thrombus - No evidence of thrombus on TEE in March 2018 or on echo today  5. Chronic kidney disease stage III:  - baseline creatinine 1.8-2.0. BMET today.  6. OSA - Sleep study 4/18 with mild OSA. AHI 13.8/hr. Following with Dr. Radford Pax.    Glori Bickers, MD 07/12/16

## 2016-07-12 NOTE — Progress Notes (Signed)
  Echocardiogram 2D Echocardiogram has been performed.  [name] 07/12/2016, 2:45 PM

## 2016-08-04 ENCOUNTER — Other Ambulatory Visit (HOSPITAL_COMMUNITY): Payer: Self-pay | Admitting: Pharmacist

## 2016-08-04 MED ORDER — SACUBITRIL-VALSARTAN 49-51 MG PO TABS: 1.0000 | ORAL_TABLET | Freq: Two times a day (BID) | ORAL | 11 refills | Status: DC

## 2016-08-06 ENCOUNTER — Telehealth (HOSPITAL_COMMUNITY): Payer: Self-pay | Admitting: Pharmacist

## 2016-08-06 NOTE — Telephone Encounter (Signed)
[employer] patient assistance approved for [employer] 24-26 mg BID through 07/06/17.   Ruta Hinds. Velva Harman, PharmD, BCPS, CPP Clinical Pharmacist Pager: [phone] Phone: [phone] 08/06/2016 2:01 PM

## 2016-08-13 ENCOUNTER — Telehealth (HOSPITAL_COMMUNITY): Payer: Self-pay | Admitting: Pharmacist

## 2016-08-13 NOTE — Telephone Encounter (Signed)
[employer] patient assistance approved for Eliquis 5 mg BID through 08/12/17.   Ruta Hinds. Velva Harman, PharmD, BCPS, CPP Clinical Pharmacist Pager: [phone] Phone: [phone] 08/13/2016 12:58 PM

## 2016-08-24 ENCOUNTER — Other Ambulatory Visit (HOSPITAL_COMMUNITY): Payer: Self-pay | Admitting: Cardiology

## 2016-08-24 ENCOUNTER — Other Ambulatory Visit (HOSPITAL_COMMUNITY): Payer: Self-pay | Admitting: Pharmacist

## 2016-08-24 MED ORDER — APIXABAN 5 MG PO TABS: 5.0000 mg | ORAL_TABLET | Freq: Two times a day (BID) | ORAL | 3 refills | Status: DC

## 2016-08-24 NOTE — Telephone Encounter (Signed)
PAP called to request Rx for application completion

## 2016-09-14 ENCOUNTER — Ambulatory Visit (HOSPITAL_COMMUNITY)
Admission: RE | Admit: 2016-09-14 | Discharge: 2016-09-14 | Disposition: A | Discharge status: Home / Self Care | Payer: PRIVATE HEALTH INSURANCE | Source: Ambulatory Visit | Attending: Internal Medicine | Admitting: Internal Medicine

## 2016-09-14 VITALS — BP 140/78 | HR 57 | Wt 234.0 lb

## 2016-09-14 DIAGNOSIS — J45909 Unspecified asthma, uncomplicated: Secondary | ICD-10-CM | POA: Insufficient documentation

## 2016-09-14 DIAGNOSIS — R21 Rash and other nonspecific skin eruption: Secondary | ICD-10-CM | POA: Insufficient documentation

## 2016-09-14 DIAGNOSIS — R000 Tachycardia, unspecified: Secondary | ICD-10-CM | POA: Insufficient documentation

## 2016-09-14 DIAGNOSIS — R51 Headache: Secondary | ICD-10-CM | POA: Insufficient documentation

## 2016-09-14 DIAGNOSIS — I5022 Chronic systolic (congestive) heart failure: Secondary | ICD-10-CM | POA: Insufficient documentation

## 2016-09-14 DIAGNOSIS — K219 Gastro-esophageal reflux disease without esophagitis: Secondary | ICD-10-CM | POA: Insufficient documentation

## 2016-09-14 DIAGNOSIS — Z7901 Long term (current) use of anticoagulants: Secondary | ICD-10-CM | POA: Insufficient documentation

## 2016-09-14 DIAGNOSIS — I10 Essential (primary) hypertension: Secondary | ICD-10-CM

## 2016-09-14 DIAGNOSIS — I429 Cardiomyopathy, unspecified: Secondary | ICD-10-CM | POA: Insufficient documentation

## 2016-09-14 DIAGNOSIS — N400 Enlarged prostate without lower urinary tract symptoms: Secondary | ICD-10-CM | POA: Insufficient documentation

## 2016-09-14 DIAGNOSIS — E669 Obesity, unspecified: Secondary | ICD-10-CM | POA: Insufficient documentation

## 2016-09-14 DIAGNOSIS — Z8249 Family history of ischemic heart disease and other diseases of the circulatory system: Secondary | ICD-10-CM | POA: Insufficient documentation

## 2016-09-14 DIAGNOSIS — Z87442 Personal history of urinary calculi: Secondary | ICD-10-CM | POA: Insufficient documentation

## 2016-09-14 DIAGNOSIS — I480 Paroxysmal atrial fibrillation: Secondary | ICD-10-CM | POA: Insufficient documentation

## 2016-09-14 DIAGNOSIS — Z79899 Other long term (current) drug therapy: Secondary | ICD-10-CM | POA: Insufficient documentation

## 2016-09-14 DIAGNOSIS — G4733 Obstructive sleep apnea (adult) (pediatric): Secondary | ICD-10-CM | POA: Insufficient documentation

## 2016-09-14 DIAGNOSIS — I130 Hypertensive heart and chronic kidney disease with heart failure and stage 1 through stage 4 chronic kidney disease, or unspecified chronic kidney disease: Secondary | ICD-10-CM | POA: Insufficient documentation

## 2016-09-14 DIAGNOSIS — Z888 Allergy status to other drugs, medicaments and biological substances status: Secondary | ICD-10-CM | POA: Insufficient documentation

## 2016-09-14 DIAGNOSIS — E782 Mixed hyperlipidemia: Secondary | ICD-10-CM | POA: Insufficient documentation

## 2016-09-14 LAB — BASIC METABOLIC PANEL
ANION GAP: 8 (ref 5–15)
BUN: 18 mg/dL (ref 6–20)
CALCIUM: 8.9 mg/dL (ref 8.9–10.3)
CHLORIDE: 108 mmol/L (ref 101–111)
CO2: 24 mmol/L (ref 22–32)
Creatinine, Ser: 1.65 mg/dL — ABNORMAL HIGH (ref 0.61–1.24)
GFR calc Af Amer: 50 mL/min — ABNORMAL LOW (ref >60)
GFR, EST NON AFRICAN AMERICAN: 43 mL/min — AB (ref >60)
Glucose, Bld: 82 mg/dL (ref 65–99)
Potassium: 3.5 mmol/L (ref 3.5–5.1)
Sodium: 140 mmol/L (ref 135–145)

## 2016-09-14 MED ORDER — SACUBITRIL-VALSARTAN 97-103 MG PO TABS: 1.0000 | ORAL_TABLET | Freq: Two times a day (BID) | ORAL | 6 refills | Status: DC

## 2016-09-14 MED ORDER — SACUBITRIL-VALSARTAN 97-103 MG PO TABS
1.0000 | ORAL_TABLET | Freq: Two times a day (BID) | ORAL | 6 refills | Status: DC
Start: 2016-09-14 — End: 2016-09-14

## 2016-09-14 NOTE — Progress Notes (Signed)
Rx for [facility] faxed to [employer]  [phone]

## 2016-09-14 NOTE — Progress Notes (Signed)
Advanced Heart Failure Clinic Note    Primary Cardiologist: Dr. Johnsie Cancel  HPI: Mr. Greek is a 61 year old male with a past medical history of tachy mediated cardiomyopathy, atrial fibrillation (on Eliquis), systolic CHF (EF 41-93%).   He was first diagnosed with Afib in 02/2016, seen in the Afib clinic and the ED for this. He underwent 2 DC-CV and only held NSR for a few days. He started Amiodarone.   Admitted 04/16/16-04/27/16 with rapid afib and decompensation. He was started on milrinone for marginal mixed venous sat of 57% and optimization with plans for repeat DCCV after loading with IV Amiodarone. Underwent TEE/DCCV on 04/23/16 with restoration of NSR.He was continued on Eliquis for anticoagulation at discharge, as he did have some evidence of LV thrombus on Echo, however it appeared to have resolved when he had a TEE on 04/23/16. Overall diuresed 9.5L and discharge weight was 217 pounds.   Echo 3/18 EF 40-45% RV mildly HK   He returns today for HF follow up. Feels good. Active out in the yard. If it is hot and he is bending over a lot may get dizzy otherwise much improved. Minimal SOB. No edema, orthopnea or PND. Says he has continually gotten stronger and has more stamina. Watching diet closely. Low sodium. Watching fluid intake. Has gained 10 pounds says he has good appetite. Weight at home 230-231. SBP 130-140 range. No bleeding with apixaban.     Past Medical History:  Diagnosis Date  . Asthma, persistent   . BPH (benign prostatic hyperplasia) 2014  . Combined hyperlipidemia   . GERD (gastroesophageal reflux disease)   . Hypertension   . 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 Obesity   . Renal stone 04/2012  . Seasonal allergic rhinitis     Current Outpatient Prescriptions  Medication Sig Dispense Refill  . acetaminophen (TYLENOL) 325 MG tablet Take 2 tablets (650 mg total) by mouth every 6 (six) hours as needed for mild pain or headache.    . albuterol (PROVENTIL  HFA;VENTOLIN HFA) 108 (90 Base) MCG/ACT inhaler Inhale 2 puffs into the lungs every 6 (six) hours as needed for wheezing or shortness of breath.    Marland Kitchen amiodarone (PACERONE) 200 MG tablet Take 1 tablet (200 mg total) by mouth daily. 30 tablet 3  . apixaban (ELIQUIS) 5 MG TABS tablet Take 1 tablet (5 mg total) by mouth 2 (two) times daily. 180 tablet 3  . fluticasone furoate-vilanterol (BREO ELLIPTA) 100-25 MCG/INH AEPB Inhale 1 puff into the lungs daily.    . furosemide (LASIX) 40 MG tablet Take 1 tablet (40 mg total) by mouth daily. 30 tablet 3  . hydrALAZINE (APRESOLINE) 50 MG tablet Take 1 tablet (50 mg total) by mouth 3 (three) times daily. 90 tablet 6  . pantoprazole (PROTONIX) 40 MG tablet Take 40 mg by mouth daily.    . potassium chloride SA (K-DUR,KLOR-CON) 20 MEQ tablet Take 1 tablet (20 mEq total) by mouth 2 (two) times daily. 60 tablet 6  . sacubitril-valsartan (ENTRESTO) 49-51 MG Take 1 tablet by mouth 2 (two) times daily. 60 tablet 11  . tamsulosin (FLOMAX) 0.4 MG CAPS capsule Take 0.4 mg by mouth at bedtime.     No current facility-administered medications for this encounter.     Allergies  Allergen Reactions  . Isosorbide Other (See Comments)    HEADACHES from Imdur  . Lisinopril Cough  . Digoxin And Related Rash      Social History   Social History  .  Marital status: Married    Spouse name: N/A  . Number of children: N/A  . Years of education: N/A   Occupational History  . 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 Smoking status: Never Smoker  . Smokeless tobacco: Never Used  . Alcohol use No  . Drug use: No  . Sexual activity: Not on file   Other Topics Concern  . Not on file   Social History Narrative  . No narrative on file      Family History  Problem Relation Age of Onset  . Hypertension Mother     Vitals:   09/14/16 1437  BP: 140/78  Pulse: (!) 57  SpO2: 97%  Weight: 234 lb 0.6 oz (106.2 kg)     PHYSICAL EXAM: General:   Well appearing. No resp difficulty HEENT: normal Neck: supple. no JVD. Carotids 2+ bilat; no bruits. No lymphadenopathy or thryomegaly appreciated. Cor: PMI nondisplaced. Brady rate & rhythm. No rubs, gallops or murmurs. Lungs: clear Abdomen: soft, nontender, nondistended. No hepatosplenomegaly. No bruits or masses. Good bowel sounds. Extremities: no cyanosis, clubbing, rash, trace edema Neuro: alert & orientedx3, cranial nerves grossly intact. moves all 4 extremities w/o difficulty. Affect pleasant    ASSESSMENT & PLAN: 1. Chronic systolic CHF: EF 94-17%.  NICM, felt to be tachy mediated. ? AF. Echo 3/18 EF 40-45% with RV mild HK  - Doing well. NYHA I-II - Volume status stable minimally elevated on exam.  - Increase Entresto to 97/103 bid - No digoxin as it caused a rash - Intolerant to Imdur, caused headaches.  - No beta blocker with bradycardia. - Continue hydralazine 50mg  TID.   - We had previously discussed proceeding with coronary angio to exclude CAD but as he is doing well and no angina will hold off. If develops symptoms or EF worse can reconsider.  - BMET today 2. Paroxsymal atrial fibrillation - Remains in sinus brady - Continue amiodarone 200mg  daily - Continue Eliquis - tolerating well without bleeding  - TSH 03/2016 - 1.52 3. Hypertension - BP still slightly elevated. Increasing Entresto.  4. History of LV thrombus - Resolved 5. Chronic kidney disease stage III:  - baseline creatinine 1.8-2.0. Recheck labs today. 6. OSA - Sleep study 4/18 with mild OSA. AHI 13.8/hr. Following with Dr. Radford Pax.    Glori Bickers, MD 09/14/16

## 2016-09-14 NOTE — Patient Instructions (Signed)
Increase Entresto to 97/103 mg Twice daily   Labs today  Your physician recommends that you schedule a follow-up appointment in: 3 months

## 2016-10-01 ENCOUNTER — Telehealth (HOSPITAL_COMMUNITY): Payer: Self-pay | Admitting: Cardiology

## 2016-10-01 NOTE — Telephone Encounter (Signed)
Patient called to request samples of entresto, patient reports he will has a delay with [employer] PAP. Advised our supply is limited however will leave what I could in the front office  Medication Samples have been provided to the patient.  Drug name: entresto       Strength: 49/51        Qty: 14  LOT: [id number]  Exp.Date: 10/19  Dosing instructions: two tabs twice day  The patient has been instructed regarding the correct time, dose, and frequency of taking this medication, including desired effects and most common side effects.   Kerry Dory 10:22 AM 10/01/2016

## 2016-10-06 ENCOUNTER — Other Ambulatory Visit (HOSPITAL_COMMUNITY): Payer: Self-pay | Admitting: *Deleted

## 2016-10-06 MED ORDER — AMIODARONE HCL 200 MG PO TABS: 200.0000 mg | ORAL_TABLET | Freq: Every day | ORAL | 3 refills | Status: DC

## 2016-11-19 ENCOUNTER — Telehealth (HOSPITAL_COMMUNITY): Payer: Self-pay | Admitting: *Deleted

## 2016-11-19 NOTE — Telephone Encounter (Signed)
Received pt's Eliquis from [employer], left pt detailed mess on his ID VM stating we had medication and it would be at front desk for pt to p/u

## 2016-12-10 ENCOUNTER — Other Ambulatory Visit (HOSPITAL_COMMUNITY): Payer: Self-pay | Admitting: Student

## 2016-12-21 ENCOUNTER — Encounter (HOSPITAL_COMMUNITY): Payer: Self-pay | Admitting: Internal Medicine

## 2016-12-21 ENCOUNTER — Ambulatory Visit (HOSPITAL_COMMUNITY)
Admission: RE | Admit: 2016-12-21 | Discharge: 2016-12-21 | Disposition: A | Discharge status: Home / Self Care | Payer: PRIVATE HEALTH INSURANCE | Source: Ambulatory Visit | Attending: Internal Medicine | Admitting: Internal Medicine

## 2016-12-21 VITALS — BP 148/88 | HR 44 | Wt 234.0 lb

## 2016-12-21 DIAGNOSIS — K219 Gastro-esophageal reflux disease without esophagitis: Secondary | ICD-10-CM | POA: Insufficient documentation

## 2016-12-21 DIAGNOSIS — I5022 Chronic systolic (congestive) heart failure: Secondary | ICD-10-CM | POA: Insufficient documentation

## 2016-12-21 DIAGNOSIS — G4733 Obstructive sleep apnea (adult) (pediatric): Secondary | ICD-10-CM | POA: Insufficient documentation

## 2016-12-21 DIAGNOSIS — E782 Mixed hyperlipidemia: Secondary | ICD-10-CM | POA: Insufficient documentation

## 2016-12-21 DIAGNOSIS — Z7901 Long term (current) use of anticoagulants: Secondary | ICD-10-CM | POA: Insufficient documentation

## 2016-12-21 DIAGNOSIS — I480 Paroxysmal atrial fibrillation: Secondary | ICD-10-CM | POA: Diagnosis not present

## 2016-12-21 DIAGNOSIS — E669 Obesity, unspecified: Secondary | ICD-10-CM | POA: Insufficient documentation

## 2016-12-21 DIAGNOSIS — N400 Enlarged prostate without lower urinary tract symptoms: Secondary | ICD-10-CM | POA: Insufficient documentation

## 2016-12-21 DIAGNOSIS — Z87442 Personal history of urinary calculi: Secondary | ICD-10-CM | POA: Insufficient documentation

## 2016-12-21 DIAGNOSIS — Z79899 Other long term (current) drug therapy: Secondary | ICD-10-CM | POA: Insufficient documentation

## 2016-12-21 DIAGNOSIS — Z8249 Family history of ischemic heart disease and other diseases of the circulatory system: Secondary | ICD-10-CM | POA: Insufficient documentation

## 2016-12-21 DIAGNOSIS — I130 Hypertensive heart and chronic kidney disease with heart failure and stage 1 through stage 4 chronic kidney disease, or unspecified chronic kidney disease: Secondary | ICD-10-CM | POA: Insufficient documentation

## 2016-12-21 DIAGNOSIS — J45909 Unspecified asthma, uncomplicated: Secondary | ICD-10-CM | POA: Insufficient documentation

## 2016-12-21 DIAGNOSIS — Z888 Allergy status to other drugs, medicaments and biological substances status: Secondary | ICD-10-CM | POA: Insufficient documentation

## 2016-12-21 DIAGNOSIS — R000 Tachycardia, unspecified: Secondary | ICD-10-CM | POA: Insufficient documentation

## 2016-12-21 DIAGNOSIS — L270 Generalized skin eruption due to drugs and medicaments taken internally: Secondary | ICD-10-CM | POA: Insufficient documentation

## 2016-12-21 DIAGNOSIS — I429 Cardiomyopathy, unspecified: Secondary | ICD-10-CM | POA: Insufficient documentation

## 2016-12-21 NOTE — Patient Instructions (Signed)
You may take benadryl at night and apply hydrocortisone for rash.  Please talk with your pharmacist regarding medications as discussed with Dr. Daphene Calamity.   Please follow up in 2 months.

## 2016-12-21 NOTE — Progress Notes (Signed)
Advanced Heart Failure Clinic Note    Primary Cardiologist: Dr. Johnsie Cancel  HPI: [name] is a 61 year old male with a past medical history of tachy mediated cardiomyopathy, atrial fibrillation (on Eliquis), systolic CHF (EF 42-68%).   He was first diagnosed with Afib in 02/2016, seen in the Afib clinic and the ED for this. He underwent 2 DC-CV and only held NSR for a few days. He started Amiodarone.   Admitted 04/16/16-04/27/16 with rapid afib and decompensation. He was started on milrinone for marginal mixed venous sat of 57% and optimization with plans for repeat DCCV after loading with IV Amiodarone. Underwent TEE/DCCV on 04/23/16 with restoration of NSR.He was continued on Eliquis for anticoagulation at discharge, as he did have some evidence of LV thrombus on Echo, however it appeared to have resolved when he had a TEE on 04/23/16. Overall diuresed 9.5L and discharge weight was 217 pounds.   Echo 3/18 EF 40-45% RV mildly HK   He returns today for HF follow up. Feels good from a cardiac perspective. Active. Denies CP or SOB. No palpitations. Weight stable. Watching fluid intake. No bleeding with Eliquis. C/o rash on hips and trunk.     Past Medical History:  Diagnosis Date  . Asthma, persistent   . BPH (benign prostatic hyperplasia) 2014  . Combined hyperlipidemia   . GERD (gastroesophageal reflux disease)   . Hypertension   . 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 Obesity   . Renal stone 04/2012  . Seasonal allergic rhinitis     Current Outpatient Medications  Medication Sig Dispense Refill  . acetaminophen (TYLENOL) 325 MG tablet Take 2 tablets (650 mg total) by mouth every 6 (six) hours as needed for mild pain or headache.    . albuterol (PROVENTIL HFA;VENTOLIN HFA) 108 (90 Base) MCG/ACT inhaler Inhale 2 puffs into the lungs every 6 (six) hours as needed for wheezing or shortness of breath.    Marland Kitchen amiodarone (PACERONE) 200 MG tablet Take 1 tablet (200 mg total) by mouth  daily. 30 tablet 3  . apixaban (ELIQUIS) 5 MG TABS tablet Take 1 tablet (5 mg total) by mouth 2 (two) times daily. 180 tablet 3  . fluticasone furoate-vilanterol (BREO ELLIPTA) 100-25 MCG/INH AEPB Inhale 1 puff into the lungs daily.    . furosemide (LASIX) 40 MG tablet Take 1 tablet (40 mg total) by mouth daily. 30 tablet 3  . hydrALAZINE (APRESOLINE) 50 MG tablet Take 1 tablet (50 mg total) by mouth 3 (three) times daily. 90 tablet 6  . KLOR-CON M20 20 MEQ tablet TAKE 1 TABLET BY MOUTH TWICE A DAY 60 tablet 6  . pantoprazole (PROTONIX) 40 MG tablet Take 40 mg by mouth daily.    . sacubitril-valsartan (ENTRESTO) 97-103 MG Take 1 tablet by mouth 2 (two) times daily. 60 tablet 6  . tamsulosin (FLOMAX) 0.4 MG CAPS capsule Take 0.4 mg by mouth at bedtime.     No current facility-administered medications for this encounter.     Allergies  Allergen Reactions  . Isosorbide Other (See Comments)    HEADACHES from Imdur  . Lisinopril Cough  . Digoxin And Related Rash      Social History   Socioeconomic History  . Marital status: Married    Spouse name: Not on file  . Number of children: Not on file  . Years of education: Not on file  . Highest education level: Not on file  Social Needs  . Financial resource strain: Not on  file  . Food insecurity - worry: Not on file  . Food insecurity - inability: Not on file  . Transportation needs - medical: Not on file  . Transportation needs - non-medical: Not on file  Occupational History  . Occupation: GREENHOUSE MANAGEMENT  Tobacco Use  . Smoking status: Never Smoker  . Smokeless tobacco: Never Used  Substance and Sexual Activity  . Alcohol use: No  . Drug use: No  . Sexual activity: Not on file  Other Topics Concern  . Not on file  Social History Narrative  . Not on file      Family History  Problem Relation Age of Onset  . Hypertension Mother     Vitals:   12/21/16 1438  BP: (!) 148/88  Pulse: (!) 44  SpO2: 96%  Weight:  234 lb (106.1 kg)     PHYSICAL EXAM: General:  Well appearing. No resp difficulty HEENT: normal Neck: supple. no JVD. Carotids 2+ bilat; no bruits. No lymphadenopathy or thryomegaly appreciated. Cor: PMI nondisplaced. Brady regular No rubs, gallops or murmurs. Lungs: clear Abdomen: soft, nontender, nondistended. No hepatosplenomegaly. No bruits or masses. Good bowel sounds. Extremities: no cyanosis, clubbing, rash, edema Neuro: alert & orientedx3, cranial nerves grossly intact. moves all 4 extremities w/o difficulty. Affect pleasant Skin: Maculopapular rash on both thighs and under bot arms and spread to chest   ASSESSMENT & PLAN: 1. Chronic systolic CHF: EF 81-01%.  NICM, felt to be tachy mediated. ? AF. Echo 3/18 EF 40-45% with RV mild HK  - Doing well. NYHA I-II - Volume status looks good  - Continue Entresto to 97/103 bid - Intolerant to Imdur, caused headaches.  - No beta blocker with bradycardia. - Continue hydralazine 50mg  TID.   - Repeat echo next visit to reassess for LV recovery - We had previously discussed proceeding with coronary angio to exclude CAD but as he is doing well and no angina will hold off. If develops symptoms or EF worse can reconsider.  2. Paroxsymal atrial fibrillation - Remains in sinus brady - Continue amiodarone 200mg  daily - Continue Eliquis - tolerating well without bleeding.check LFTs and TFTs at next visit 3. Hypertension - BP still slightly elevated but will not adjust meds today given drug rash and need to identify offending agent  4. History of LV thrombus - Resolved 5. Chronic kidney disease stage III:  - baseline creatinine 1.8-2.0. Recheck labs today. 6. OSA - Sleep study 4/18 with mild OSA. AHI 13.8/hr. Following with Dr. Radford Pax.  7. Rash - clearly looks like a drug rash. Denies new medications or new soaps, etc. I spoke with our pharmacist who suggested it might be a change in the carrier in one of his meds.He confirmed that he  refilled 4 meds last week and rash started afterwards. He will speak to his Pharmacy ([facility]) and check if any of those generic meds were switched to a new company. Use topical hydrocortisone and nighttime benadryl for symptomatic relief. Contact us or PCP if getting worse.   Glori Bickers, MD 12/21/16

## 2017-01-11 ENCOUNTER — Other Ambulatory Visit (HOSPITAL_COMMUNITY): Payer: Self-pay | Admitting: Internal Medicine

## 2017-01-21 ENCOUNTER — Other Ambulatory Visit (HOSPITAL_COMMUNITY): Payer: Self-pay | Admitting: Cardiology

## 2017-01-21 MED ORDER — AMIODARONE HCL 200 MG PO TABS: 200.0000 mg | ORAL_TABLET | Freq: Every day | ORAL | 3 refills | Status: DC

## 2017-02-11 ENCOUNTER — Telehealth (HOSPITAL_COMMUNITY): Payer: Self-pay | Admitting: *Deleted

## 2017-02-11 NOTE — Telephone Encounter (Signed)
Received patient's eliquis from [facility], 3 bottles.  Called and spoke with patient and will leave at front desk for him to pickup.

## 2017-02-15 DIAGNOSIS — J4530 Mild persistent asthma, uncomplicated: Secondary | ICD-10-CM | POA: Diagnosis not present

## 2017-02-15 DIAGNOSIS — I10 Essential (primary) hypertension: Secondary | ICD-10-CM | POA: Diagnosis not present

## 2017-02-15 DIAGNOSIS — I481 Persistent atrial fibrillation: Secondary | ICD-10-CM | POA: Diagnosis not present

## 2017-02-15 DIAGNOSIS — I5020 Unspecified systolic (congestive) heart failure: Secondary | ICD-10-CM | POA: Diagnosis not present

## 2017-02-21 ENCOUNTER — Encounter (HOSPITAL_COMMUNITY): Payer: Self-pay | Admitting: Internal Medicine

## 2017-02-21 ENCOUNTER — Ambulatory Visit (HOSPITAL_COMMUNITY)
Admission: RE | Admit: 2017-02-21 | Discharge: 2017-02-21 | Disposition: A | Discharge status: Home / Self Care | Payer: BLUE CROSS/BLUE SHIELD | Source: Ambulatory Visit | Attending: Internal Medicine | Admitting: Internal Medicine

## 2017-02-21 ENCOUNTER — Other Ambulatory Visit: Payer: Self-pay

## 2017-02-21 VITALS — BP 120/70 | HR 58 | Wt 233.5 lb

## 2017-02-21 DIAGNOSIS — R21 Rash and other nonspecific skin eruption: Secondary | ICD-10-CM | POA: Insufficient documentation

## 2017-02-21 DIAGNOSIS — R000 Tachycardia, unspecified: Secondary | ICD-10-CM | POA: Diagnosis not present

## 2017-02-21 DIAGNOSIS — I130 Hypertensive heart and chronic kidney disease with heart failure and stage 1 through stage 4 chronic kidney disease, or unspecified chronic kidney disease: Secondary | ICD-10-CM | POA: Insufficient documentation

## 2017-02-21 DIAGNOSIS — Z79899 Other long term (current) drug therapy: Secondary | ICD-10-CM | POA: Diagnosis not present

## 2017-02-21 DIAGNOSIS — I480 Paroxysmal atrial fibrillation: Secondary | ICD-10-CM | POA: Insufficient documentation

## 2017-02-21 DIAGNOSIS — I429 Cardiomyopathy, unspecified: Secondary | ICD-10-CM | POA: Diagnosis not present

## 2017-02-21 DIAGNOSIS — Z7902 Long term (current) use of antithrombotics/antiplatelets: Secondary | ICD-10-CM | POA: Insufficient documentation

## 2017-02-21 DIAGNOSIS — Z888 Allergy status to other drugs, medicaments and biological substances status: Secondary | ICD-10-CM | POA: Insufficient documentation

## 2017-02-21 DIAGNOSIS — Z87442 Personal history of urinary calculi: Secondary | ICD-10-CM | POA: Diagnosis not present

## 2017-02-21 DIAGNOSIS — N400 Enlarged prostate without lower urinary tract symptoms: Secondary | ICD-10-CM | POA: Insufficient documentation

## 2017-02-21 DIAGNOSIS — G4733 Obstructive sleep apnea (adult) (pediatric): Secondary | ICD-10-CM | POA: Diagnosis not present

## 2017-02-21 DIAGNOSIS — E782 Mixed hyperlipidemia: Secondary | ICD-10-CM | POA: Diagnosis not present

## 2017-02-21 DIAGNOSIS — Z8249 Family history of ischemic heart disease and other diseases of the circulatory system: Secondary | ICD-10-CM | POA: Insufficient documentation

## 2017-02-21 DIAGNOSIS — E669 Obesity, unspecified: Secondary | ICD-10-CM | POA: Diagnosis not present

## 2017-02-21 DIAGNOSIS — I5022 Chronic systolic (congestive) heart failure: Secondary | ICD-10-CM | POA: Insufficient documentation

## 2017-02-21 DIAGNOSIS — K219 Gastro-esophageal reflux disease without esophagitis: Secondary | ICD-10-CM | POA: Insufficient documentation

## 2017-02-21 DIAGNOSIS — I10 Essential (primary) hypertension: Secondary | ICD-10-CM | POA: Diagnosis not present

## 2017-02-21 DIAGNOSIS — Z7901 Long term (current) use of anticoagulants: Secondary | ICD-10-CM | POA: Diagnosis not present

## 2017-02-21 DIAGNOSIS — J45909 Unspecified asthma, uncomplicated: Secondary | ICD-10-CM | POA: Insufficient documentation

## 2017-02-21 DIAGNOSIS — N183 Chronic kidney disease, stage 3 unspecified: Secondary | ICD-10-CM

## 2017-02-21 LAB — COMPREHENSIVE METABOLIC PANEL
ALT: 22 U/L (ref 17–63)
AST: 24 U/L (ref 15–41)
Albumin: 3.8 g/dL (ref 3.5–5.0)
Alkaline Phosphatase: 65 U/L (ref 38–126)
Anion gap: 10 (ref 5–15)
BUN: 26 mg/dL — AB (ref 6–20)
CHLORIDE: 108 mmol/L (ref 101–111)
CO2: 23 mmol/L (ref 22–32)
Calcium: 9.1 mg/dL (ref 8.9–10.3)
Creatinine, Ser: 1.96 mg/dL — ABNORMAL HIGH (ref 0.61–1.24)
GFR calc Af Amer: 41 mL/min — ABNORMAL LOW (ref >60)
GFR calc non Af Amer: 35 mL/min — ABNORMAL LOW (ref >60)
Glucose, Bld: 85 mg/dL (ref 65–99)
POTASSIUM: 3.8 mmol/L (ref 3.5–5.1)
SODIUM: 141 mmol/L (ref 135–145)
Total Bilirubin: 0.8 mg/dL (ref 0.3–1.2)
Total Protein: 6.6 g/dL (ref 6.5–8.1)

## 2017-02-21 NOTE — Patient Instructions (Signed)
Routine lab work today. Will notify you of abnormal results  Your provider requests you have an Echocardiogram in the next few weeks.  Follow up with Dr.Bensimhon in 6 months.

## 2017-02-21 NOTE — Progress Notes (Signed)
Advanced Heart Failure Clinic Note    Primary Cardiologist: Dr. Johnsie Cancel  HPI: [name] is a 62 year old male with a past medical history of tachy mediated cardiomyopathy, atrial fibrillation (on Eliquis), systolic CHF (EF 87-86%).   He was first diagnosed with Afib in 02/2016, seen in the Afib clinic and the ED for this. He underwent 2 DC-CV and only held NSR for a few days. He started Amiodarone.   Admitted 04/16/16-04/27/16 with rapid afib and decompensation. He was started on milrinone for marginal mixed venous sat of 57% and optimization with plans for repeat DCCV after loading with IV Amiodarone. Underwent TEE/DCCV on 04/23/16 with restoration of NSR.He was continued on Eliquis for anticoagulation at discharge, as he did have some evidence of LV thrombus on Echo, however it appeared to have resolved when he had a TEE on 04/23/16. Overall diuresed 9.5L and discharge weight was 217 pounds.   Echo 3/18 EF 40-45% RV mildly HK   He presents today for follow up. At last visit was complaining of likely drug rash, so no med changes. He went back to pharmacy and his amiodarone had switched manufacturers. Rash resolved with switching back - suspect allergy was due to the carrier part of the medication.  Denies SOB or CP. 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 No palpitations. Denies lightheadedness or dizziness. Denies bleeding on Eliquis. BPs 120-140s at home.  He has mild peripheral edema that worsens throughout the day and is gone in the mornings. He intermittently wears compression hose, and hasn't needed any extra lasix.   Past Medical History:  Diagnosis Date  . Asthma, persistent   . BPH (benign prostatic hyperplasia) 2014  . Combined hyperlipidemia   . GERD (gastroesophageal reflux disease)   . Hypertension   . 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 Obesity   . Renal stone 04/2012  . Seasonal allergic  rhinitis     Current Outpatient Medications  Medication Sig Dispense Refill  . acetaminophen (TYLENOL) 325 MG tablet Take 2 tablets (650 mg total) by mouth every 6 (six) hours as needed for mild pain or headache.    . albuterol (PROVENTIL HFA;VENTOLIN HFA) 108 (90 Base) MCG/ACT inhaler Inhale 2 puffs into the lungs every 6 (six) hours as needed for wheezing or shortness of breath.    Marland Kitchen amiodarone (PACERONE) 200 MG tablet Take 1 tablet (200 mg total) by mouth daily. 30 tablet 3  . apixaban (ELIQUIS) 5 MG TABS tablet Take 1 tablet (5 mg total) by mouth 2 (two) times daily. 180 tablet 3  . fluticasone furoate-vilanterol (BREO ELLIPTA) 100-25 MCG/INH AEPB Inhale 1 puff into the lungs daily.    . furosemide (LASIX) 40 MG tablet TAKE 1 TABLET BY MOUTH EVERY DAY 30 tablet 3  . hydrALAZINE (APRESOLINE) 50 MG tablet Take 1 tablet (50 mg total) by mouth 3 (three) times daily. 90 tablet 6  . KLOR-CON M20 20 MEQ tablet TAKE 1 TABLET BY MOUTH TWICE A DAY 60 tablet 6  . pantoprazole (PROTONIX) 40 MG tablet Take 40 mg by mouth daily.    . sacubitril-valsartan (ENTRESTO) 97-103 MG Take 1 tablet by mouth 2 (two) times daily. 60 tablet 6  . tamsulosin (FLOMAX) 0.4 MG CAPS capsule Take 0.4 mg by mouth at bedtime.     No current facility-administered medications for this encounter.     Allergies  Allergen Reactions  . Isosorbide Other (See Comments)  HEADACHES from Imdur  . Lisinopril Cough  . Digoxin And Related Rash      Social History   Socioeconomic History  . Marital status: Married    Spouse name: Not on file  . Number of children: Not on file  . Years of education: Not on file  . Highest education level: Not on file  Social Needs  . Financial resource strain: Not on file  . Food insecurity - worry: Not on file  . Food insecurity - inability: Not on file  . Transportation needs - medical: Not on file  . Transportation needs - non-medical: Not on file  Occupational History  .  Occupation: GREENHOUSE MANAGEMENT  Tobacco Use  . Smoking status: Never Smoker  . Smokeless tobacco: Never Used  Substance and Sexual Activity  . Alcohol use: No  . Drug use: No  . Sexual activity: Not on file  Other Topics Concern  . Not on file  Social History Narrative  . Not on file      Family History  Problem Relation Age of Onset  . Hypertension Mother     Vitals:   02/21/17 1504  BP: 120/70  Pulse: (!) 58  SpO2: 98%  Weight: 233 lb 8 oz (105.9 kg)   Wt Readings from Last 3 Encounters:  02/21/17 233 lb 8 oz (105.9 kg)  12/21/16 234 lb (106.1 kg)  09/14/16 234 lb 0.6 oz (106.2 kg)    PHYSICAL EXAM: General: Well appearing. No resp difficulty. HEENT: Normal anicteric Neck: Supple. JVP 5-6. Carotids 2+ bilat; no bruits. No thyromegaly or nodule noted. Cor: PMI nondisplaced. Regular, brady. No M/G/R noted Lungs: CTAB, normal effort. Abdomen: Obese soft, non-tender, non-distended, no HSM. No bruits or masses. +BS  Extremities: No cyanosis, clubbing, or rash. Trace to 1+ edema, R>L.  Neuro: Alert & orientedx3, cranial nerves grossly intact. moves all 4 extremities w/o difficulty. Affect pleasant   ASSESSMENT & PLAN: 1. Chronic systolic CHF: EF 63-87%.  NICM, felt to be tachy mediated. ? AF. - Echo 06/2016 LVEF 40%. Due Repeat Echo.  - Volume status stable on exam.  - Continue Entresto to 97/103 BID. Labs today.  - Intolerant to Imdur, caused headaches.  - No BB with bradycardia. - Continue hydralazine 50mg  TID.   - We had previously discussed proceeding with coronary angio to exclude CAD but as he is doing well and no angina will hold off. If develops symptoms or EF worse can reconsider.  2. Paroxsymal atrial fibrillation - Remains in sinus brady - Continue amiodarone 200 mg daily. Maintenance labs today.  - Continue Eliquis. Denies bleeding. Check LFTs and TFTs today.  3. Hypertension - Stable on current meds.  4. History of LV thrombus - Resolved. 5.  Chronic kidney disease stage III:  - baseline creatinine 1.8-2.0.  - CMET today.  6. OSA - Sleep study 4/18 with mild OSA. AHI 13.8/hr. Following with Dr. Radford Pax. No change 7. Rash - Resolved with change in Amiodarone manufacturers. His home pharmacy is aware of the correct suspension for him.   Labs today. Repeat Echo. If EF remains depressed or lower can consider cath (No history of angiography)  Shirley Friar, PA-C 02/21/17   Patient seen and examined with the above-signed Advanced Practice Provider and/or Housestaff. I personally reviewed laboratory data, imaging studies and relevant notes. I independently examined the patient and formulated the important aspects of the plan. I have edited the note to reflect any of my changes or salient points. I  have personally discussed the plan with the patient and/or family.  He is doing very well. Maintaining NSR on low-dose amio. EF on last echo about 40%. Will repeat echo. If EF not improving can consider cath (has not has ischemic eval) if renal function ok. Tolerating Eliquis without bleeding. Check TSH and LFTs on amio.   Glori Bickers, MD  5:16 PM

## 2017-03-15 ENCOUNTER — Other Ambulatory Visit: Payer: Self-pay | Admitting: *Deleted

## 2017-03-16 MED ORDER — HYDRALAZINE HCL 50 MG PO TABS: 50.0000 mg | ORAL_TABLET | Freq: Three times a day (TID) | ORAL | 6 refills | Status: DC

## 2017-03-17 ENCOUNTER — Ambulatory Visit (HOSPITAL_COMMUNITY)
Admission: RE | Admit: 2017-03-17 | Discharge: 2017-03-17 | Disposition: A | Discharge status: Home / Self Care | Payer: BLUE CROSS/BLUE SHIELD | Source: Ambulatory Visit | Attending: Internal Medicine | Admitting: Internal Medicine

## 2017-03-17 DIAGNOSIS — I5022 Chronic systolic (congestive) heart failure: Secondary | ICD-10-CM | POA: Diagnosis not present

## 2017-03-17 NOTE — Progress Notes (Signed)
  Echocardiogram 2D Echocardiogram has been performed.  [name] 03/17/2017, 4:23 PM

## 2017-04-04 ENCOUNTER — Other Ambulatory Visit (HOSPITAL_COMMUNITY): Payer: Self-pay | Admitting: Pharmacist

## 2017-04-04 MED ORDER — SACUBITRIL-VALSARTAN 97-103 MG PO TABS: 1.0000 | ORAL_TABLET | Freq: Two times a day (BID) | ORAL | 3 refills | Status: DC

## 2017-04-25 DIAGNOSIS — H2513 Age-related nuclear cataract, bilateral: Secondary | ICD-10-CM | POA: Diagnosis not present

## 2017-04-25 DIAGNOSIS — H5203 Hypermetropia, bilateral: Secondary | ICD-10-CM | POA: Diagnosis not present

## 2017-05-05 ENCOUNTER — Telehealth (HOSPITAL_COMMUNITY): Payer: Self-pay | Admitting: *Deleted

## 2017-05-05 NOTE — Telephone Encounter (Signed)
Received pt's Eliquis from [facility], called pt and left detailed message on his ID VM that medication is here and at front desk for him to p/u

## 2017-05-17 ENCOUNTER — Other Ambulatory Visit (HOSPITAL_COMMUNITY): Payer: Self-pay | Admitting: Internal Medicine

## 2017-05-31 ENCOUNTER — Other Ambulatory Visit (HOSPITAL_COMMUNITY): Payer: Self-pay | Admitting: *Deleted

## 2017-05-31 MED ORDER — FUROSEMIDE 40 MG PO TABS: 40.0000 mg | ORAL_TABLET | Freq: Every day | ORAL | 3 refills | Status: DC

## 2017-06-11 ENCOUNTER — Other Ambulatory Visit (HOSPITAL_COMMUNITY): Payer: Self-pay | Admitting: Internal Medicine

## 2017-06-13 DIAGNOSIS — N183 Chronic kidney disease, stage 3 (moderate): Secondary | ICD-10-CM | POA: Diagnosis not present

## 2017-06-13 DIAGNOSIS — I480 Paroxysmal atrial fibrillation: Secondary | ICD-10-CM | POA: Diagnosis not present

## 2017-06-13 DIAGNOSIS — R319 Hematuria, unspecified: Secondary | ICD-10-CM | POA: Diagnosis not present

## 2017-06-13 DIAGNOSIS — R42 Dizziness and giddiness: Secondary | ICD-10-CM | POA: Diagnosis not present

## 2017-06-28 ENCOUNTER — Other Ambulatory Visit (HOSPITAL_COMMUNITY): Payer: Self-pay | Admitting: Internal Medicine

## 2017-07-14 IMAGING — DX DG CHEST 1V PORT
2 series · 2 of 2 positions shown · non-contrast
Comparison: Chest x-ray of 03/30/2016

CLINICAL DATA: Shortness of breath

EXAM:
PORTABLE CHEST 1 VIEW

[chest ap (1 of 2)]
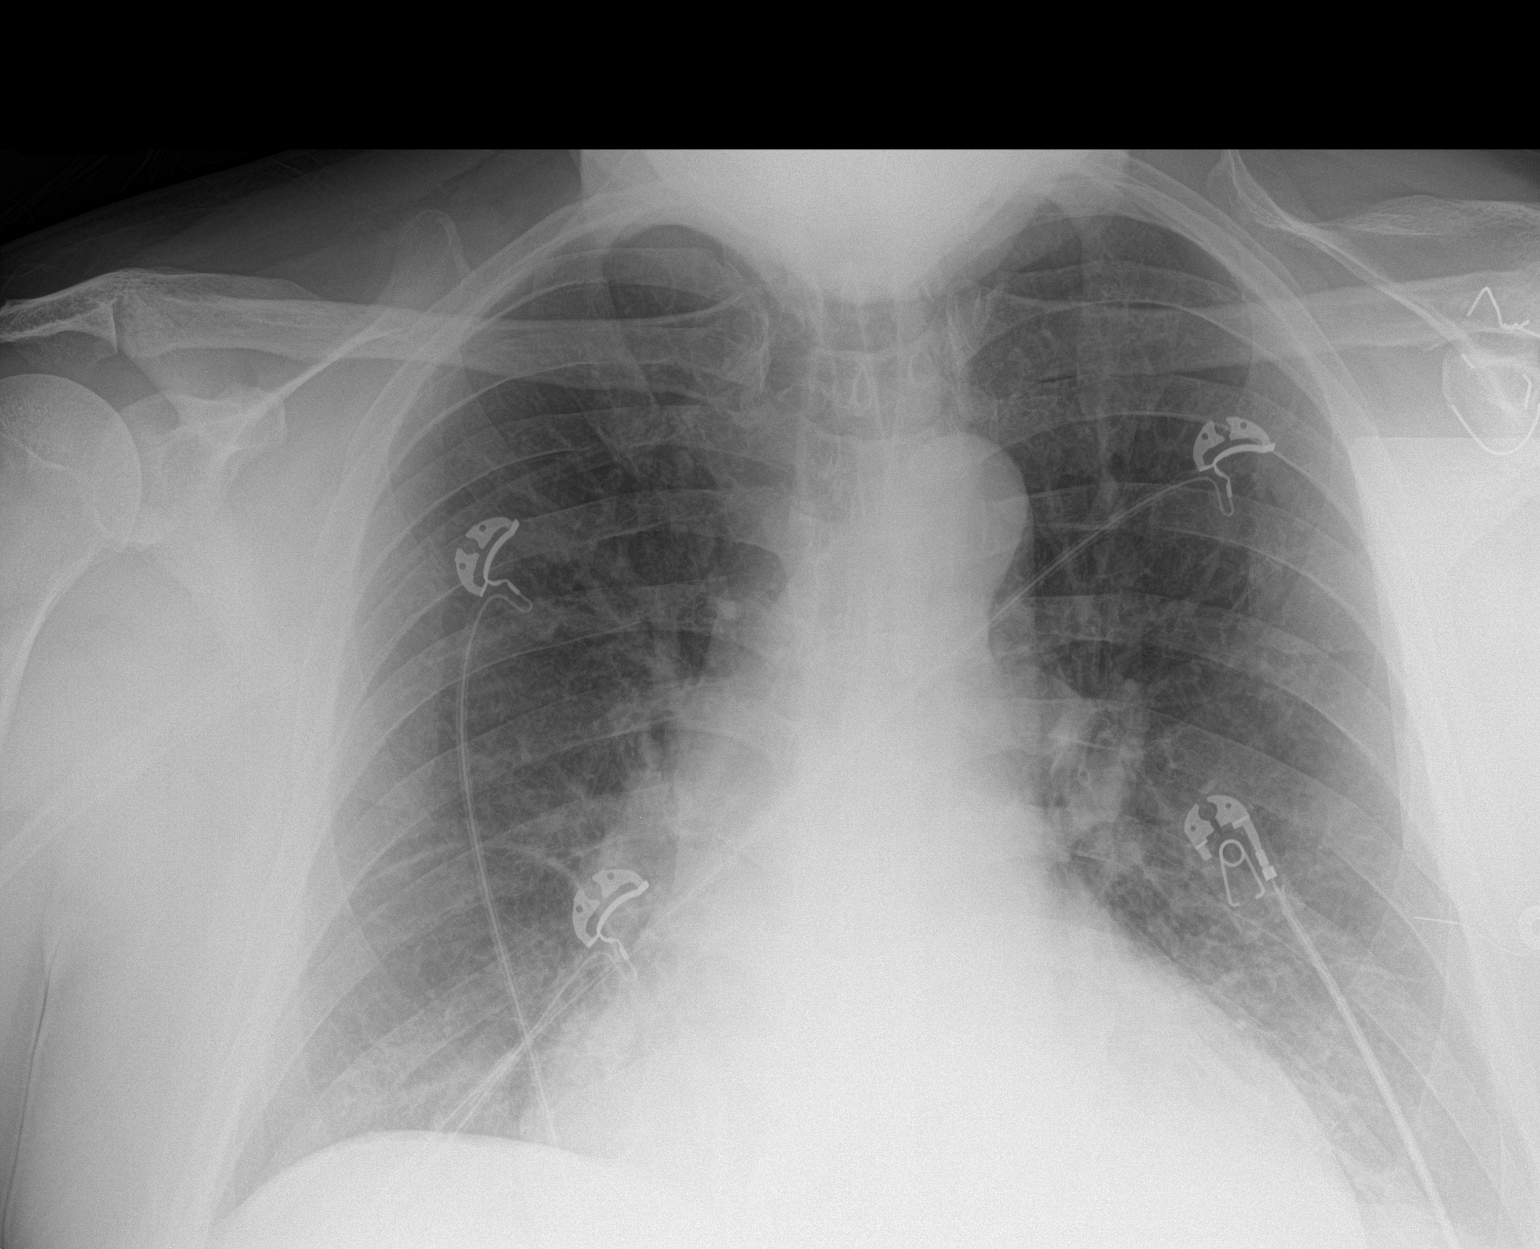

[chest ap (2 of 2)]
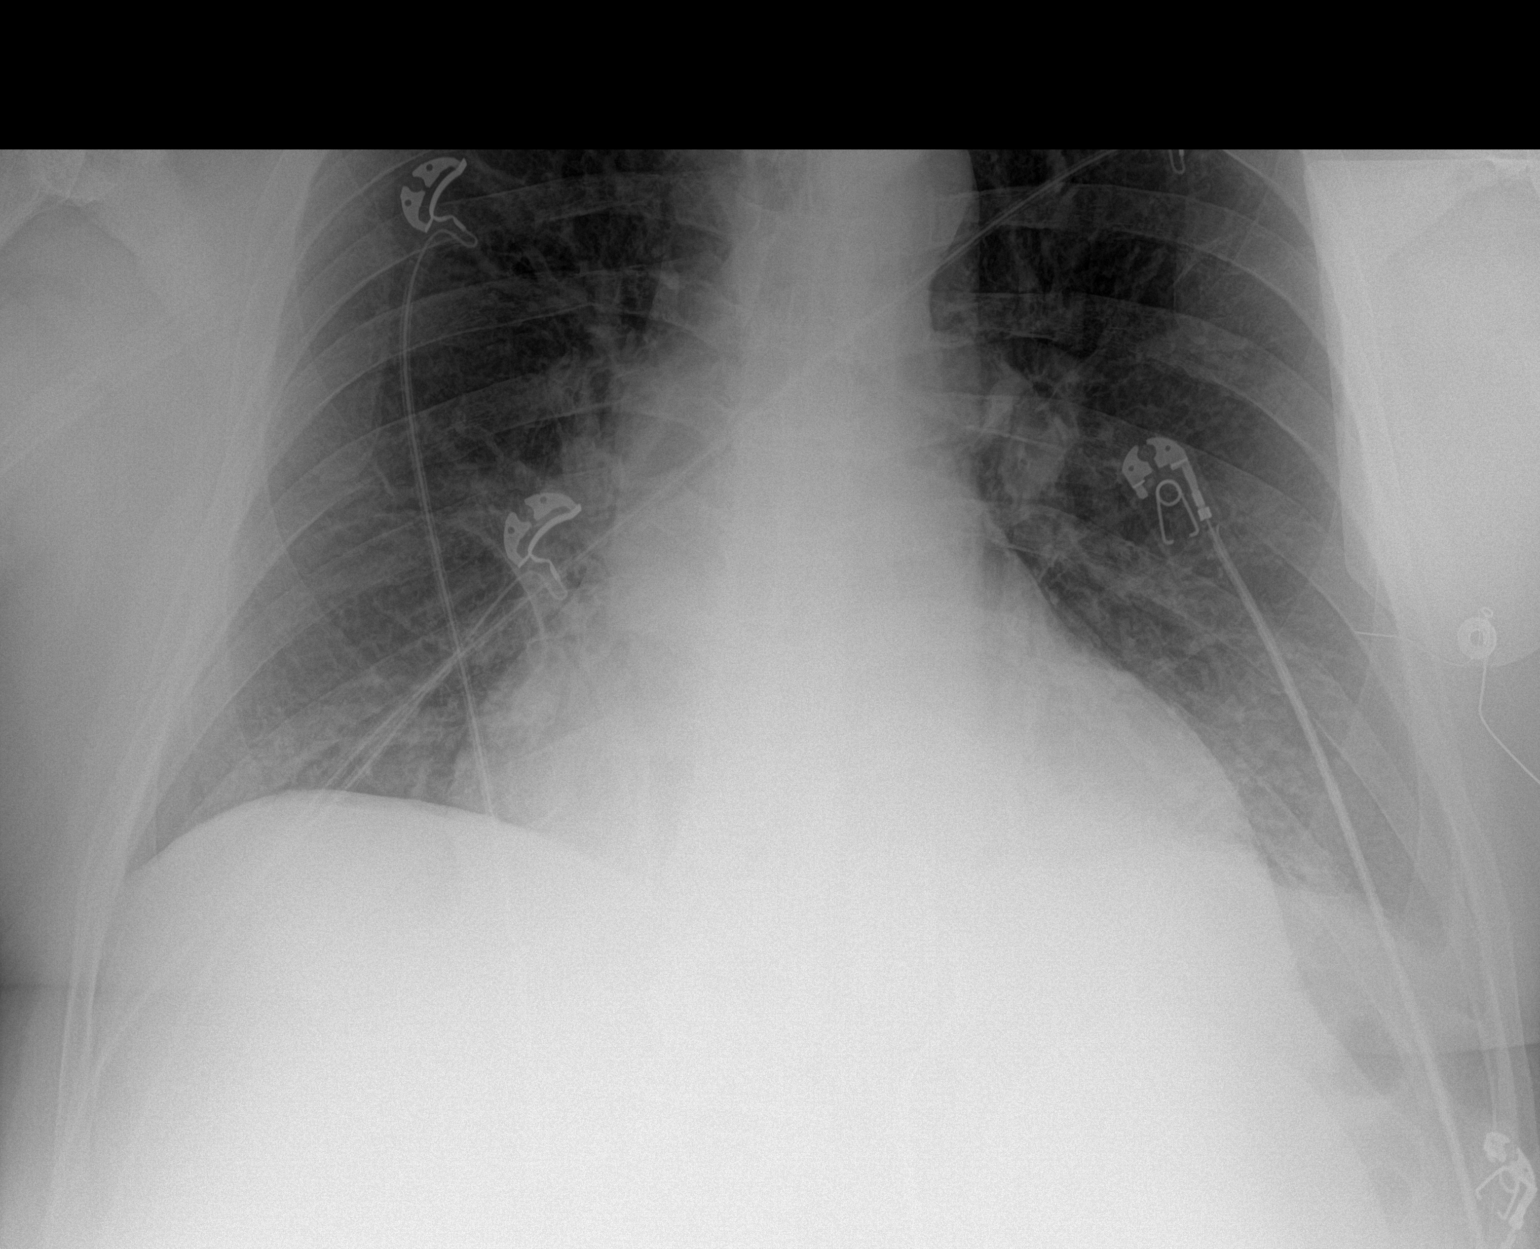

[2 of 2 positions shown; findings below may reference images not displayed]

FINDINGS: Mild left basilar atelectasis or scarring remains. No definite
pneumonia is seen. A tiny left pleural effusion cannot be excluded.
Cardiomegaly is stable.
IMPRESSION: 1. Mild left basilar atelectasis.
2. Cannot exclude small left pleural effusion.

## 2017-07-18 IMAGING — DX DG CHEST 2V
2 series · 2 of 2 positions shown · non-contrast
Comparison: 03/31/2016

CLINICAL DATA: Shortness of breath since last night.

EXAM:
CHEST  2 VIEW

[chest pa]
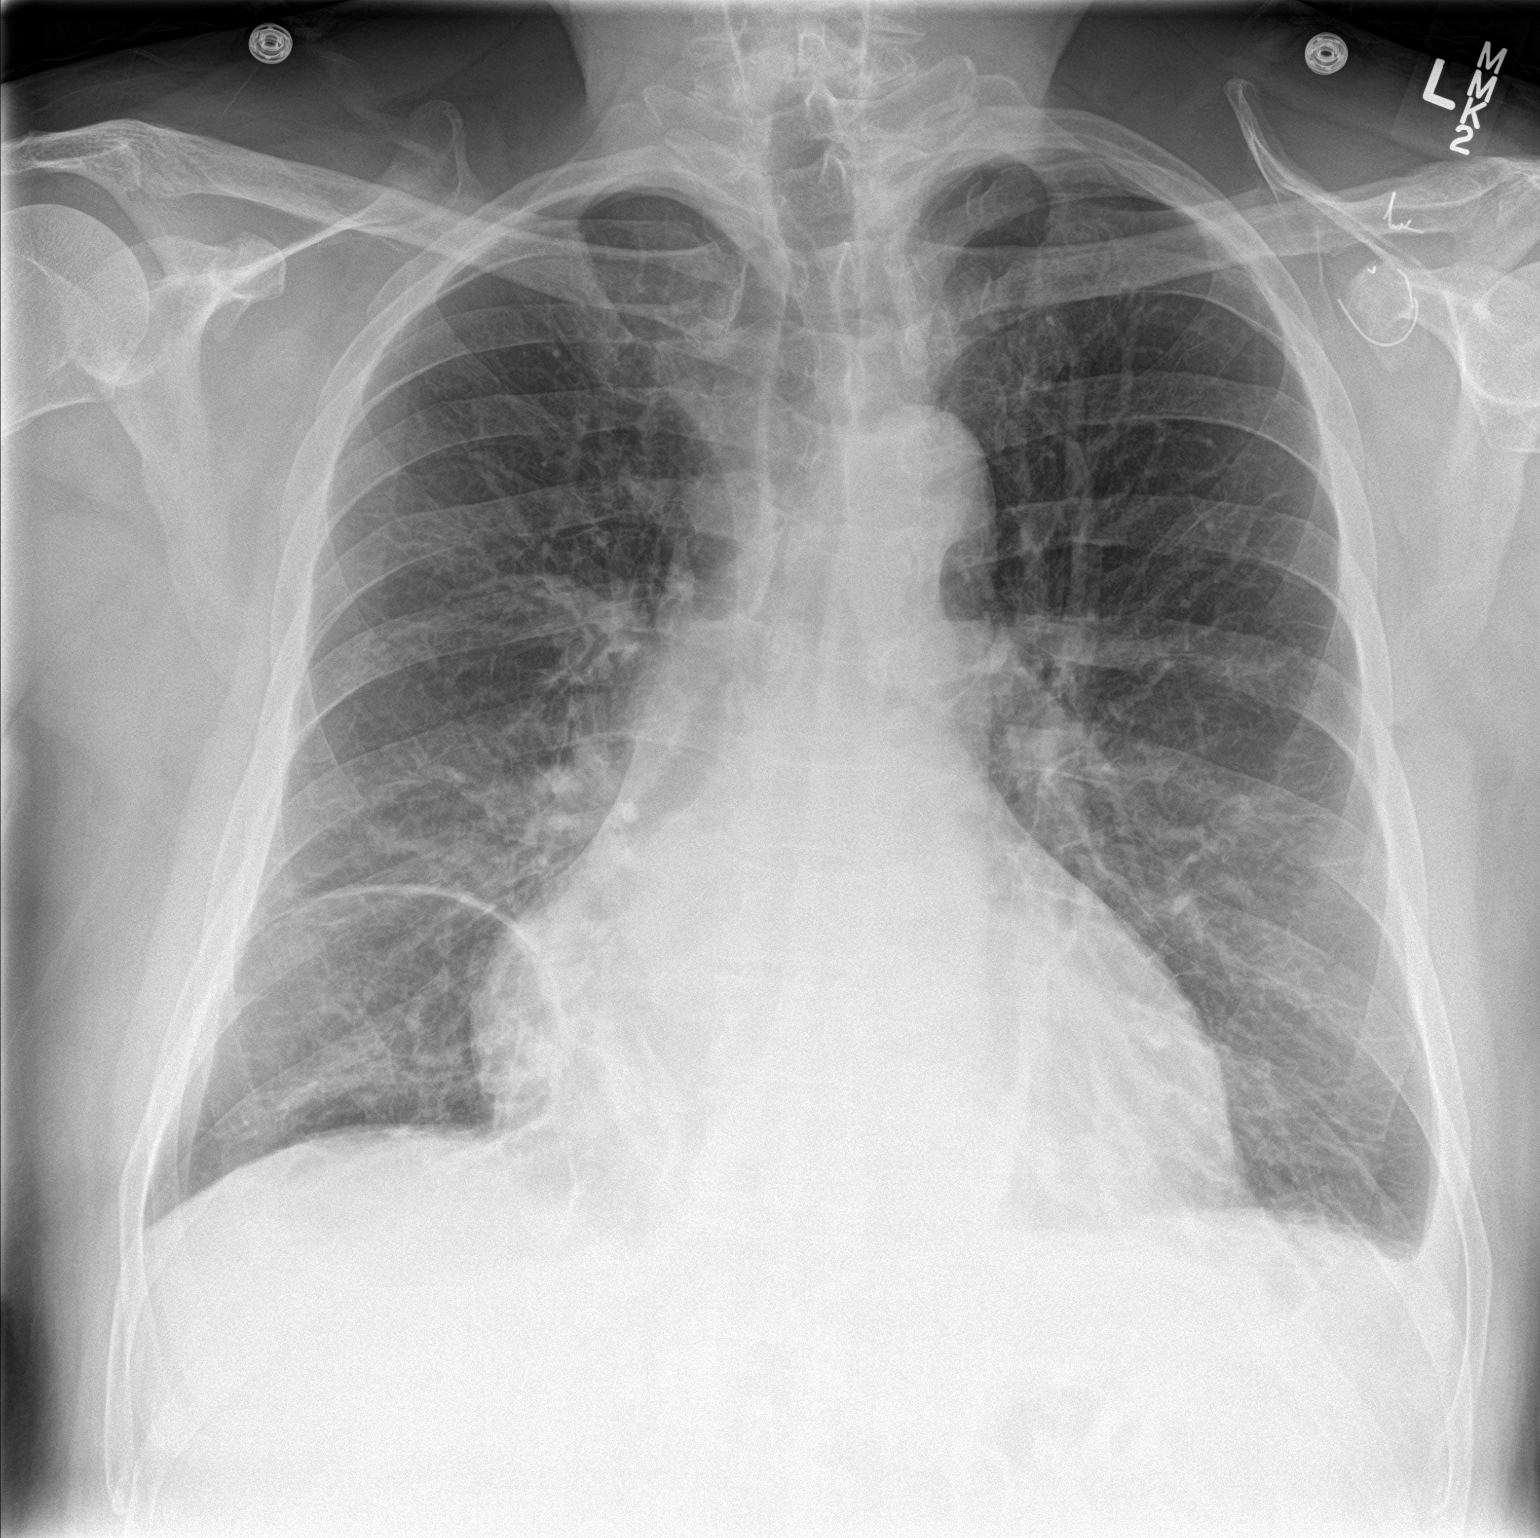

[chest lat]
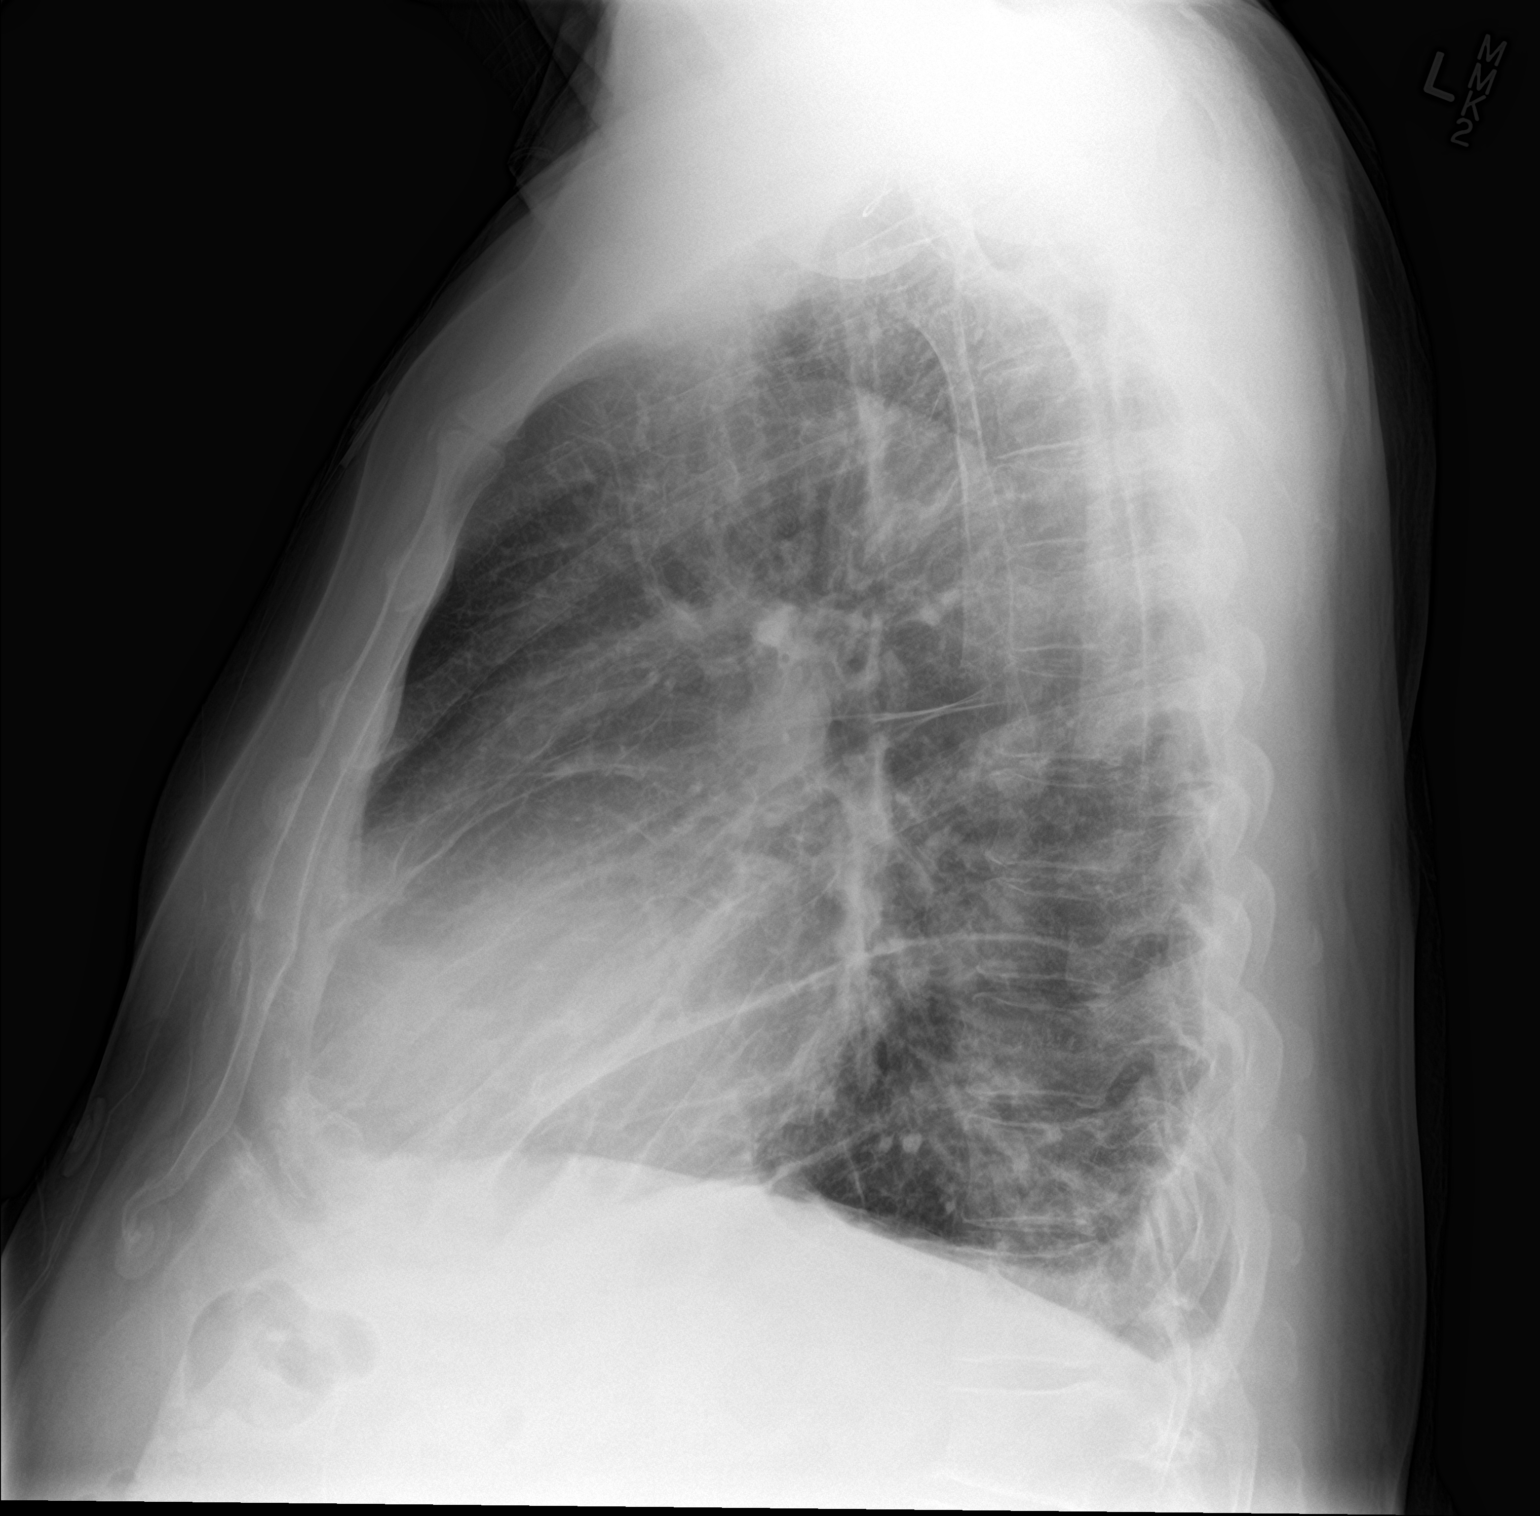

[2 of 2 positions shown; findings below may reference images not displayed]

FINDINGS: There is a trace left pleural effusion. There is no focal
consolidation. There is mild bilateral interstitial thickening.
There is no pneumothorax. There is mild stable cardiomegaly.

The osseous structures are unremarkable.
IMPRESSION: 1. Cardiomegaly with mild interstitial edema.

## 2017-07-24 ENCOUNTER — Encounter (HOSPITAL_COMMUNITY): Payer: Self-pay | Admitting: Emergency Medicine

## 2017-07-24 ENCOUNTER — Other Ambulatory Visit: Payer: Self-pay

## 2017-07-24 ENCOUNTER — Emergency Department (HOSPITAL_COMMUNITY)
Admission: EM | Admit: 2017-07-24 | Discharge: 2017-07-25 | Disposition: A | Discharge status: Home / Self Care | Payer: BLUE CROSS/BLUE SHIELD | Attending: Emergency Medicine | Admitting: Emergency Medicine

## 2017-07-24 DIAGNOSIS — N23 Unspecified renal colic: Secondary | ICD-10-CM | POA: Diagnosis not present

## 2017-07-24 DIAGNOSIS — N201 Calculus of ureter: Secondary | ICD-10-CM

## 2017-07-24 DIAGNOSIS — I110 Hypertensive heart disease with heart failure: Secondary | ICD-10-CM | POA: Diagnosis not present

## 2017-07-24 DIAGNOSIS — Z7901 Long term (current) use of anticoagulants: Secondary | ICD-10-CM | POA: Diagnosis not present

## 2017-07-24 DIAGNOSIS — R109 Unspecified abdominal pain: Secondary | ICD-10-CM | POA: Diagnosis not present

## 2017-07-24 DIAGNOSIS — I5021 Acute systolic (congestive) heart failure: Secondary | ICD-10-CM | POA: Diagnosis not present

## 2017-07-24 DIAGNOSIS — N132 Hydronephrosis with renal and ureteral calculous obstruction: Secondary | ICD-10-CM | POA: Diagnosis not present

## 2017-07-24 DIAGNOSIS — Z79899 Other long term (current) drug therapy: Secondary | ICD-10-CM | POA: Diagnosis not present

## 2017-07-24 DIAGNOSIS — R1032 Left lower quadrant pain: Secondary | ICD-10-CM | POA: Diagnosis not present

## 2017-07-24 DIAGNOSIS — J45909 Unspecified asthma, uncomplicated: Secondary | ICD-10-CM | POA: Diagnosis not present

## 2017-07-24 DIAGNOSIS — N200 Calculus of kidney: Secondary | ICD-10-CM | POA: Diagnosis not present

## 2017-07-24 LAB — URINALYSIS, ROUTINE W REFLEX MICROSCOPIC
Bilirubin Urine: NEGATIVE
GLUCOSE, UA: NEGATIVE mg/dL
Ketones, ur: 5 mg/dL — AB
Leukocytes, UA: NEGATIVE
NITRITE: NEGATIVE
PROTEIN: 100 mg/dL — AB
RBC / HPF: >50 RBC/hpf — ABNORMAL HIGH (ref 0–5)
Specific Gravity, Urine: 1.016 (ref 1.005–1.030)
pH: 5 (ref 5.0–8.0)

## 2017-07-24 LAB — CBC
HEMATOCRIT: 43.5 % (ref 39.0–52.0)
HEMOGLOBIN: 14.4 g/dL (ref 13.0–17.0)
MCH: 29.9 pg (ref 26.0–34.0)
MCHC: 33.1 g/dL (ref 30.0–36.0)
MCV: 90.2 fL (ref 78.0–100.0)
Platelets: 274 10*3/uL (ref 150–400)
RBC: 4.82 MIL/uL (ref 4.22–5.81)
RDW: 13.5 % (ref 11.5–15.5)
WBC: 12.3 10*3/uL — ABNORMAL HIGH (ref 4.0–10.5)

## 2017-07-24 LAB — COMPREHENSIVE METABOLIC PANEL
ALBUMIN: 3.8 g/dL (ref 3.5–5.0)
ALT: 23 U/L (ref 0–44)
ANION GAP: 13 (ref 5–15)
AST: 24 U/L (ref 15–41)
Alkaline Phosphatase: 66 U/L (ref 38–126)
BUN: 23 mg/dL (ref 8–23)
CHLORIDE: 108 mmol/L (ref 98–111)
CO2: 19 mmol/L — AB (ref 22–32)
Calcium: 8.8 mg/dL — ABNORMAL LOW (ref 8.9–10.3)
Creatinine, Ser: 2.37 mg/dL — ABNORMAL HIGH (ref 0.61–1.24)
GFR calc Af Amer: 32 mL/min — ABNORMAL LOW (ref >60)
GFR calc non Af Amer: 28 mL/min — ABNORMAL LOW (ref >60)
GLUCOSE: 103 mg/dL — AB (ref 70–99)
POTASSIUM: 3.2 mmol/L — AB (ref 3.5–5.1)
SODIUM: 140 mmol/L (ref 135–145)
TOTAL PROTEIN: 6.5 g/dL (ref 6.5–8.1)
Total Bilirubin: 1.2 mg/dL (ref 0.3–1.2)

## 2017-07-24 LAB — LIPASE, BLOOD: LIPASE: 32 U/L (ref 11–51)

## 2017-07-24 MED ORDER — FENTANYL CITRATE (PF) 100 MCG/2ML IJ SOLN
50.0000 ug | INTRAMUSCULAR | Status: DC | PRN
  Administered 2017-07-24: 50 ug via NASAL
  Filled 2017-07-24: qty 2

## 2017-07-24 MED ORDER — IOPAMIDOL (ISOVUE-300) INJECTION 61%
INTRAVENOUS | Status: AC
  Filled 2017-07-24: qty 30

## 2017-07-24 MED ORDER — HYDROMORPHONE HCL 1 MG/ML IJ SOLN
1.0000 mg | Freq: Once | INTRAMUSCULAR | Status: AC
  Administered 2017-07-24: 1 mg via INTRAVENOUS
  Filled 2017-07-24: qty 1

## 2017-07-24 MED ORDER — SODIUM CHLORIDE 0.9 % IV BOLUS
500.0000 mL | Freq: Once | INTRAVENOUS | Status: AC
  Administered 2017-07-24: 500 mL via INTRAVENOUS

## 2017-07-24 MED ORDER — SODIUM CHLORIDE 0.9 % IV BOLUS: 1000.0000 mL | Freq: Once | INTRAVENOUS | Status: DC

## 2017-07-24 MED ORDER — ONDANSETRON 4 MG PO TBDP
4.0000 mg | ORAL_TABLET | Freq: Once | ORAL | Status: AC | PRN
  Administered 2017-07-24: 4 mg via ORAL
  Filled 2017-07-24: qty 1

## 2017-07-24 NOTE — ED Notes (Signed)
CT delivered contrast, gave instructions

## 2017-07-24 NOTE — ED Provider Notes (Signed)
[facility] EMERGENCY DEPARTMENT Provider Note   CSN: [id number] Arrival date & time: 07/24/17  2115     History   Chief Complaint Chief Complaint  Patient presents with  . Abdominal Pain    HPI [name] is a 62 y.o. male.  62 year old male with a history of BPH, hypertension, acute systolic heart failure, atrial fibrillation (on chronic Eliquis), lymphocytic colitis presents to the emergency department for left lower quadrant pain.  He states that pain began at 1400 today.  It has gradually worsened and became more severe at 1900.  He denies any radiation of his pain and has not taken any medication for his symptoms.  He states that the pain feels similar to prior episode of colitis.  Has had associated nausea with 10 minutes of emesis at home.  No vomiting in the ED.  Denies any bloody emesis.  Also noted some associated diarrhea.  No melena, hematochezia, fevers, dysuria, hematuria, history of abdominal surgeries.  Last colonoscopy was approximately 4 years ago.  Patient previously seen by Dr. Paulita Fujita, GI.  The history is provided by the patient. No language interpreter was used.  Abdominal Pain      Past Medical History:  Diagnosis Date  . Asthma, persistent   . BPH (benign prostatic hyperplasia) 2014  . Combined hyperlipidemia   . GERD (gastroesophageal reflux disease)   . Hypertension   . 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 Obesity   . Renal stone 04/2012  . Seasonal allergic rhinitis     Patient Active Problem List   Diagnosis Date Noted  . Acute systolic heart failure ([place]) 04/16/2016    Past Surgical History:  Procedure Laterality Date  . CARDIOVERSION N/A 04/23/2016   Procedure: CARDIOVERSION;  Surgeon: Jolaine Artist, MD;  Location: [facility] ENDOSCOPY;  Service: Cardiovascular;  Laterality: N/A;  . TEE WITHOUT CARDIOVERSION N/A 04/23/2016   Procedure: TRANSESOPHAGEAL ECHOCARDIOGRAM (TEE);  Surgeon: Jolaine Artist, MD;   Location: [facility] ENDOSCOPY;  Service: Cardiovascular;  Laterality: N/A;        Home Medications    Prior to Admission medications   Medication Sig Start Date End Date Taking? Authorizing Provider  acetaminophen (TYLENOL) 325 MG tablet Take 2 tablets (650 mg total) by mouth every 6 (six) hours as needed for mild pain or headache. 04/27/16   Shirley Friar, PA-C  albuterol (PROVENTIL HFA;VENTOLIN HFA) 108 (90 Base) MCG/ACT inhaler Inhale 2 puffs into the lungs every 6 (six) hours as needed for wheezing or shortness of breath.    [provider]  amiodarone (PACERONE) 200 MG tablet TAKE 1 TABLET BY MOUTH EVERY DAY 05/17/17   Bensimhon, Shaune Pascal, MD  apixaban (ELIQUIS) 5 MG TABS tablet Take 1 tablet (5 mg total) by mouth 2 (two) times daily. 08/24/16   Arbutus Leas, NP  fluticasone furoate-vilanterol (BREO ELLIPTA) 100-25 MCG/INH AEPB Inhale 1 puff into the lungs daily.    [provider]  furosemide (LASIX) 40 MG tablet TAKE 1 TABLET BY MOUTH EVERY DAY 06/13/17   Bensimhon, Shaune Pascal, MD  hydrALAZINE (APRESOLINE) 50 MG tablet Take 1 tablet (50 mg total) by mouth 3 (three) times daily. 03/16/17   Bensimhon, Shaune Pascal, MD  KLOR-CON M20 20 MEQ tablet TAKE 1 TABLET BY MOUTH TWICE A DAY 06/28/17   Larey Dresser, MD  oxyCODONE-acetaminophen (PERCOCET/ROXICET) 5-325 MG tablet Take 1-2 tablets by mouth every 6 (six) hours as needed for severe pain. 07/25/17   Antonietta Breach,  PA-C  pantoprazole (PROTONIX) 40 MG tablet Take 40 mg by mouth daily.    [provider]  promethazine (PHENERGAN) 25 MG tablet Take 1 tablet (25 mg total) by mouth every 6 (six) hours as needed for nausea or vomiting. 07/25/17   Antonietta Breach, PA-C  sacubitril-valsartan (ENTRESTO) 97-103 MG Take 1 tablet by mouth 2 (two) times daily. 04/04/17   Bensimhon, Shaune Pascal, MD  tamsulosin (FLOMAX) 0.4 MG CAPS capsule Take 1 capsule (0.4 mg total) by mouth at bedtime. 07/25/17   Antonietta Breach, PA-C    Family History Family  History  Problem Relation Age of Onset  . Hypertension Mother     Social History Social History   Tobacco Use  . Smoking status: Never Smoker  . Smokeless tobacco: Never Used  Substance Use Topics  . Alcohol use: No  . Drug use: No     Allergies   Isosorbide; Lisinopril; and Digoxin and related   Review of Systems Review of Systems  Gastrointestinal: Positive for abdominal pain.  Ten systems reviewed and are negative for acute change, except as noted in the HPI.    Physical Exam Updated Vital Signs BP 119/70   Pulse (!) 45   Temp 98.8 F (37.1 C) (Oral)   Resp 14   SpO2 97%   Physical Exam  Constitutional: He is oriented to person, place, and time. He appears well-developed and well-nourished. No distress.  Nontoxic appearing and in NAD  HENT:  Head: Normocephalic and atraumatic.  Eyes: Conjunctivae and EOM are normal. No scleral icterus.  Neck: Normal range of motion.  Cardiovascular: Regular rhythm and intact distal pulses.  Bradycardia   Pulmonary/Chest: Effort normal. No stridor. No respiratory distress. He has no wheezes.  Respirations even and unlabored  Abdominal:  Soft, nontender abdomen. No peritoneal signs. Reports LLQ, but denies worsening pain on palpation. No peritoneal signs.  Musculoskeletal: Normal range of motion.  Neurological: He is alert and oriented to person, place, and time. He exhibits normal muscle tone. Coordination normal.  GCS 15. Patient moving all extremities.  Skin: Skin is warm and dry. No rash noted. He is not diaphoretic. No erythema. No pallor.  Psychiatric: He has a normal mood and affect. His behavior is normal.  Nursing note and vitals reviewed.    ED Treatments / Results  Labs (all labs ordered are listed, but only abnormal results are displayed) Labs Reviewed  COMPREHENSIVE METABOLIC PANEL - Abnormal; Notable for the following components:      Result Value   Potassium 3.2 (*)    CO2 19 (*)    Glucose, Bld 103  (*)    Creatinine, Ser 2.37 (*)    Calcium 8.8 (*)    GFR calc non Af Amer 28 (*)    GFR calc Af Amer 32 (*)    All other components within normal limits  CBC - Abnormal; Notable for the following components:   WBC 12.3 (*)    All other components within normal limits  URINALYSIS, ROUTINE W REFLEX MICROSCOPIC - Abnormal; Notable for the following components:   APPearance HAZY (*)    Hgb urine dipstick LARGE (*)    Ketones, ur 5 (*)    Protein, ur 100 (*)    RBC / HPF >50 (*)    Bacteria, UA RARE (*)    All other components within normal limits  LIPASE, BLOOD    EKG None  Radiology Ct Abdomen Pelvis Wo Contrast  Result Date: 07/25/2017 CLINICAL DATA:  Left lower quadrant pain EXAM: CT ABDOMEN AND PELVIS WITHOUT CONTRAST TECHNIQUE: Multidetector CT imaging of the abdomen and pelvis was performed following the standard protocol without IV contrast. COMPARISON:  CT 05/05/2012 FINDINGS: Lower chest: Scarring at the right lung base. No acute consolidation or effusion. Heart size within normal limits. Moderate hiatal hernia. Hepatobiliary: No focal liver abnormality is seen. No gallstones, gallbladder wall thickening, or biliary dilatation. Pancreas: Unremarkable. No pancreatic ductal dilatation or surrounding inflammatory changes. Spleen: Normal in size without focal abnormality. Adrenals/Urinary Tract: Adrenal glands are within normal limits. Bilateral parapelvic cysts. Moderate left hydronephrosis and proximal hydroureter, secondary to a 6 x 7 mm stone in the proximal left ureter about 2.5 cm distal to the left UPJ. Borderline wall thickening of the bladder Stomach/Bowel: Stomach is nonenlarged. No dilated small bowel. No colon wall thickening. Vascular/Lymphatic: Nonaneurysmal aorta. No significantly enlarged lymph nodes. Mild atherosclerosis. Reproductive: Enlarged prostate with mass effect on the posterior bladder. Other: Negative for free air or free fluid.  Prior hernia repair.  Musculoskeletal: Degenerative changes. No acute or suspicious abnormality. IMPRESSION: 1. Moderate left hydronephrosis and proximal hydroureter, secondary to a 6 x 7 mm stone in proximal left ureter about 2.5 cm distal to the left UPJ 2. Multiple bilateral renal sinus cysts. 3. Prostatomegaly with mass effect on bladder 4. Moderate hiatal hernia Electronically Signed   By: Donavan Foil M.D.   On: 07/25/2017 01:36    Procedures Procedures (including critical care time)  Medications Ordered in ED Medications  fentaNYL (SUBLIMAZE) injection 50 mcg (50 mcg Nasal Given 07/24/17 2134)  iopamidol (ISOVUE-300) 61 % injection (has no administration in time range)  ondansetron (ZOFRAN-ODT) disintegrating tablet 4 mg (4 mg Oral Given 07/24/17 2130)  HYDROmorphone (DILAUDID) injection 1 mg (1 mg Intravenous Given 07/24/17 2252)  sodium chloride 0.9 % bolus 500 mL (0 mLs Intravenous Stopped 07/25/17 0204)  iopamidol (ISOVUE-300) 61 % injection 30 mL (30 mLs Oral Contrast Given 07/25/17 0101)  oxyCODONE-acetaminophen (PERCOCET/ROXICET) 5-325 MG per tablet 2 tablet (2 tablets Oral Given 07/25/17 0205)     Initial Impression / Assessment and Plan / ED Course  I have reviewed the triage vital signs and the nursing notes.  Pertinent labs & imaging results that were available during my care of the patient were reviewed by me and considered in my medical decision making (see chart for details).     Patient presents for left-sided abdominal pain which began around 1400 yesterday, worsening around 1900.  CT scan today shows 6 x 7 mm obstructing stone in the left ureter.  There is moderate associated hydronephrosis.  Patient with elevated creatinine at baseline.  Creatinine from 5 months ago 1.96.  Today patient with creatinine of 2.37.  He has been given 500 cc IV fluid given history of CHF with EF of 40 to 45%.  Leukocytosis likely secondary to pain response.  The patient has had adequate pain control following Dilaudid  and 2 tablets of Percocet.  He has a history of kidney stone in the past.  Have advised close follow-up with urology as it is unlikely this stone will pass without intervention.  Will continue Flomax.  Patient advised to return to the ED for new or worsening symptoms.  Return precautions discussed and provided. Patient discharged in stable condition with no unaddressed concerns.   Final Clinical Impressions(s) / ED Diagnoses   Final diagnoses:  Renal colic on left side  Ureterolithiasis    ED Discharge Orders  Ordered    oxyCODONE-acetaminophen (PERCOCET/ROXICET) 5-325 MG tablet  Every 6 hours PRN     07/25/17 0250    promethazine (PHENERGAN) 25 MG tablet  Every 6 hours PRN     07/25/17 0250    tamsulosin (FLOMAX) 0.4 MG CAPS capsule  Daily at bedtime     07/25/17 0250       Antonietta Breach, PA-C 07/25/17 8416    Charlesetta Shanks, MD 08/02/17 1256

## 2017-07-24 NOTE — ED Triage Notes (Signed)
Patient to ED c/o severe LLQ pain onset 2pm today with N/V x 1 PTA. Patient states he has a hx of colitis, but this feels different. Patient hardly able to sit still in triage. Denies diarrhea or fevers/chills.

## 2017-07-25 ENCOUNTER — Emergency Department (HOSPITAL_COMMUNITY)
Admission: EM | Admit: 2017-07-25 | Discharge: 2017-07-26 | Disposition: A | Discharge status: Home / Self Care | Payer: BLUE CROSS/BLUE SHIELD | Source: Home / Self Care | Attending: Emergency Medicine | Admitting: Emergency Medicine

## 2017-07-25 ENCOUNTER — Encounter (HOSPITAL_COMMUNITY): Payer: Self-pay | Admitting: Emergency Medicine

## 2017-07-25 ENCOUNTER — Emergency Department (HOSPITAL_COMMUNITY): Payer: BLUE CROSS/BLUE SHIELD

## 2017-07-25 ENCOUNTER — Other Ambulatory Visit: Payer: Self-pay

## 2017-07-25 DIAGNOSIS — J45909 Unspecified asthma, uncomplicated: Secondary | ICD-10-CM

## 2017-07-25 DIAGNOSIS — N201 Calculus of ureter: Secondary | ICD-10-CM | POA: Diagnosis not present

## 2017-07-25 DIAGNOSIS — N132 Hydronephrosis with renal and ureteral calculous obstruction: Secondary | ICD-10-CM | POA: Diagnosis not present

## 2017-07-25 DIAGNOSIS — R109 Unspecified abdominal pain: Secondary | ICD-10-CM | POA: Diagnosis not present

## 2017-07-25 DIAGNOSIS — Z7901 Long term (current) use of anticoagulants: Secondary | ICD-10-CM

## 2017-07-25 DIAGNOSIS — Z79899 Other long term (current) drug therapy: Secondary | ICD-10-CM

## 2017-07-25 DIAGNOSIS — I5020 Unspecified systolic (congestive) heart failure: Secondary | ICD-10-CM

## 2017-07-25 DIAGNOSIS — I110 Hypertensive heart disease with heart failure: Secondary | ICD-10-CM | POA: Insufficient documentation

## 2017-07-25 DIAGNOSIS — R1032 Left lower quadrant pain: Secondary | ICD-10-CM | POA: Diagnosis not present

## 2017-07-25 DIAGNOSIS — N200 Calculus of kidney: Secondary | ICD-10-CM

## 2017-07-25 LAB — CBC WITH DIFFERENTIAL/PLATELET
Abs Immature Granulocytes: 0.1 10*3/uL (ref 0.0–0.1)
Basophils Absolute: 0 10*3/uL (ref 0.0–0.1)
Basophils Relative: 0 %
Eosinophils Absolute: 0 10*3/uL (ref 0.0–0.7)
Eosinophils Relative: 0 %
HCT: 39.2 % (ref 39.0–52.0)
Hemoglobin: 12.6 g/dL — ABNORMAL LOW (ref 13.0–17.0)
Immature Granulocytes: 1 %
Lymphocytes Relative: 3 %
Lymphs Abs: 0.4 10*3/uL — ABNORMAL LOW (ref 0.7–4.0)
MCH: 30 pg (ref 26.0–34.0)
MCHC: 32.1 g/dL (ref 30.0–36.0)
MCV: 93.3 fL (ref 78.0–100.0)
Monocytes Absolute: 1 10*3/uL (ref 0.1–1.0)
Monocytes Relative: 8 %
Neutro Abs: 11.4 10*3/uL — ABNORMAL HIGH (ref 1.7–7.7)
Neutrophils Relative %: 88 %
Platelets: 229 10*3/uL (ref 150–400)
RBC: 4.2 MIL/uL — ABNORMAL LOW (ref 4.22–5.81)
RDW: 13.9 % (ref 11.5–15.5)
WBC: 12.8 10*3/uL — ABNORMAL HIGH (ref 4.0–10.5)

## 2017-07-25 LAB — URINALYSIS, ROUTINE W REFLEX MICROSCOPIC
Bilirubin Urine: NEGATIVE
Glucose, UA: NEGATIVE mg/dL
Ketones, ur: 5 mg/dL — AB
Leukocytes, UA: NEGATIVE
Nitrite: NEGATIVE
Protein, ur: 30 mg/dL — AB
RBC / HPF: >50 RBC/hpf — ABNORMAL HIGH (ref 0–5)
Specific Gravity, Urine: 1.024 (ref 1.005–1.030)
pH: 5 (ref 5.0–8.0)

## 2017-07-25 LAB — BASIC METABOLIC PANEL
Anion gap: 8 (ref 5–15)
BUN: 23 mg/dL (ref 8–23)
CO2: 24 mmol/L (ref 22–32)
Calcium: 8.5 mg/dL — ABNORMAL LOW (ref 8.9–10.3)
Chloride: 107 mmol/L (ref 98–111)
Creatinine, Ser: 2.75 mg/dL — ABNORMAL HIGH (ref 0.61–1.24)
GFR calc Af Amer: 27 mL/min — ABNORMAL LOW (ref >60)
GFR calc non Af Amer: 23 mL/min — ABNORMAL LOW (ref >60)
Glucose, Bld: 116 mg/dL — ABNORMAL HIGH (ref 70–99)
Potassium: 3.5 mmol/L (ref 3.5–5.1)
Sodium: 139 mmol/L (ref 135–145)

## 2017-07-25 MED ORDER — HYDROMORPHONE HCL 1 MG/ML IJ SOLN
1.5000 mg | Freq: Once | INTRAMUSCULAR | Status: AC
Start: 2017-07-25 — End: 2017-07-25
  Administered 2017-07-25: 1.5 mg via INTRAVENOUS
  Filled 2017-07-25: qty 2

## 2017-07-25 MED ORDER — ONDANSETRON HCL 4 MG/2ML IJ SOLN
4.0000 mg | Freq: Once | INTRAMUSCULAR | Status: AC
  Administered 2017-07-25: 4 mg via INTRAVENOUS
  Filled 2017-07-25: qty 2

## 2017-07-25 MED ORDER — HYDROMORPHONE HCL 1 MG/ML IJ SOLN
1.0000 mg | Freq: Once | INTRAMUSCULAR | Status: AC
  Administered 2017-07-25: 1 mg via INTRAVENOUS
  Filled 2017-07-25: qty 1

## 2017-07-25 MED ORDER — MORPHINE SULFATE 15 MG PO TABS: 15.0000 mg | ORAL_TABLET | ORAL | Status: DC | PRN

## 2017-07-25 MED ORDER — KETOROLAC TROMETHAMINE 30 MG/ML IJ SOLN
30.0000 mg | Freq: Once | INTRAMUSCULAR | Status: AC
  Administered 2017-07-25: 30 mg via INTRAVENOUS
  Filled 2017-07-25: qty 1

## 2017-07-25 MED ORDER — OXYCODONE-ACETAMINOPHEN 5-325 MG PO TABS: 1.0000 | ORAL_TABLET | Freq: Four times a day (QID) | ORAL | 0 refills | Status: DC | PRN

## 2017-07-25 MED ORDER — LACTATED RINGERS IV BOLUS
1000.0000 mL | Freq: Once | INTRAVENOUS | Status: AC
Start: 2017-07-25 — End: 2017-07-25
  Administered 2017-07-25: 1000 mL via INTRAVENOUS

## 2017-07-25 MED ORDER — IOPAMIDOL (ISOVUE-300) INJECTION 61%
30.0000 mL | Freq: Once | INTRAVENOUS | Status: AC | PRN
  Administered 2017-07-25: 30 mL via ORAL

## 2017-07-25 MED ORDER — OXYCODONE-ACETAMINOPHEN 5-325 MG PO TABS
2.0000 | ORAL_TABLET | Freq: Once | ORAL | Status: AC
  Administered 2017-07-25: 2 via ORAL
  Filled 2017-07-25: qty 2

## 2017-07-25 MED ORDER — PROMETHAZINE HCL 25 MG PO TABS: 25.0000 mg | ORAL_TABLET | Freq: Four times a day (QID) | ORAL | 0 refills | Status: DC | PRN

## 2017-07-25 MED ORDER — TAMSULOSIN HCL 0.4 MG PO CAPS: 0.4000 mg | ORAL_CAPSULE | Freq: Every day | ORAL | 0 refills | Status: DC

## 2017-07-25 MED ORDER — SODIUM CHLORIDE 0.9 % IV BOLUS
500.0000 mL | Freq: Once | INTRAVENOUS | Status: AC
Start: 2017-07-25 — End: 2017-07-25
  Administered 2017-07-25: 500 mL via INTRAVENOUS

## 2017-07-25 NOTE — ED Triage Notes (Signed)
Patient to ED for persistent L flank/LLQ pain since yesterday - was seen and treated here for kidney stone and f/u with [facility] today - unable to have surgery until next week and given prescription for pain medication. Today, he states he's vomited 3 times and is unable to keep meds down.

## 2017-07-25 NOTE — ED Provider Notes (Signed)
Patient placed in Quick Look pathway, seen and evaluated   Chief Complaint: left lower quadrant abdominal pain  HPI:   Patient returns to the ED with persistent nausea and vomiting. Was seen and evaluated last night for left lower quadrant abdominal pain, found to have a 6 x 7 mmobstructing stone in the left ureter. Creatinine has worsened, and 2.37 yesterday/this morning from 1.965 months ago. States that he has had 3 episodes of nonbloody nonbilious emesis today has not been able to keep down any medications.saw his urologist at [facility] urology earlier today but was told that the procedure could not be done until next week. Denies fevers.  ROS: positive for abdominal pain, nausea, vomiting negative for fevers  Physical Exam:   Gen: No distress  Neuro: Awake and Alert  Skin: Warm    Focused Exam: active bowel sounds, left lower quadrant abdominal pain on palpation. No peritoneal signs.   Initiation of care has begun. The patient has been counseled on the process, plan, and necessity for staying for the completion/evaluation, and the remainder of the medical screening examination    Debroah Baller 07/25/17 1853    Mesner, Corene Cornea, MD 07/25/17 2141

## 2017-07-25 NOTE — ED Notes (Signed)
Signature pad unavailable at time of pt discharge. Pt verbalzied understanding of instructions and prescriptions.

## 2017-07-25 NOTE — ED Notes (Signed)
Pt called wife to confirm she will come pick pt up. Pt to wait in lobby.

## 2017-07-25 NOTE — ED Notes (Signed)
Patient transported to CT 

## 2017-07-25 NOTE — Discharge Instructions (Signed)
Your found to have a 6 mm x 7 mm kidney stone on the left side.  This is the cause of your pain today and will need to be followed by urology as it is unlikely for a stone of this size to pass on its own.  We recommend that you continue with Percocet as prescribed for pain control.  Take Flomax daily.  You may use Phenergan as needed for nausea.  Call the office of [facility] in the morning to schedule a close follow-up appointment.  You may follow-up with your primary doctor as needed.  Return for new or concerning symptoms.

## 2017-07-26 MED ORDER — IBUPROFEN 800 MG PO TABS: 800.0000 mg | ORAL_TABLET | Freq: Three times a day (TID) | ORAL | 0 refills | Status: DC

## 2017-07-26 MED ORDER — ONDANSETRON HCL 4 MG PO TABS: 4.0000 mg | ORAL_TABLET | Freq: Three times a day (TID) | ORAL | 0 refills | Status: DC | PRN

## 2017-07-26 MED ORDER — MORPHINE SULFATE 30 MG PO TABS: 30.0000 mg | ORAL_TABLET | ORAL | 0 refills | Status: DC | PRN

## 2017-07-26 MED ORDER — MORPHINE SULFATE 15 MG PO TABS
15.0000 mg | ORAL_TABLET | Freq: Once | ORAL | Status: AC
  Administered 2017-07-26: 15 mg via ORAL
  Filled 2017-07-26: qty 1

## 2017-07-26 NOTE — ED Provider Notes (Signed)
[place] EMERGENCY DEPARTMENT Provider Note   CSN: [id number] Arrival date & time: 07/25/17  1759     History   Chief Complaint Chief Complaint  Patient presents with  . Nephrolithiasis    HPI [name] is a 62 y.o. male.   Flank Pain  This is a new problem. The current episode started yesterday. The problem occurs constantly. The problem has not changed since onset.Pertinent negatives include no chest pain. Nothing aggravates the symptoms. Nothing relieves the symptoms. He has tried nothing for the symptoms.    Past Medical History:  Diagnosis Date  . Asthma, persistent   . BPH (benign prostatic hyperplasia) 2014  . Combined hyperlipidemia   . GERD (gastroesophageal reflux disease)   . Hypertension   . 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 Obesity   . Renal stone 04/2012  . Seasonal allergic rhinitis     Patient Active Problem List   Diagnosis Date Noted  . Acute systolic heart failure ([place]) 04/16/2016    Past Surgical History:  Procedure Laterality Date  . CARDIOVERSION N/A 04/23/2016   Procedure: CARDIOVERSION;  Surgeon: Jolaine Artist, MD;  Location: [facility] ENDOSCOPY;  Service: Cardiovascular;  Laterality: N/A;  . TEE WITHOUT CARDIOVERSION N/A 04/23/2016   Procedure: TRANSESOPHAGEAL ECHOCARDIOGRAM (TEE);  Surgeon: Jolaine Artist, MD;  Location: [facility] ENDOSCOPY;  Service: Cardiovascular;  Laterality: N/A;        Home Medications    Prior to Admission medications   Medication Sig Start Date End Date Taking? Authorizing Provider  acetaminophen (TYLENOL) 325 MG tablet Take 2 tablets (650 mg total) by mouth every 6 (six) hours as needed for mild pain or headache. 04/27/16  Yes Shirley Friar, PA-C  albuterol (PROVENTIL HFA;VENTOLIN HFA) 108 (90 Base) MCG/ACT inhaler Inhale 2 puffs into the lungs every 6 (six) hours as needed for wheezing or shortness of breath.   Yes [provider]  amiodarone (PACERONE) 200 MG  tablet TAKE 1 TABLET BY MOUTH EVERY DAY 05/17/17  Yes Bensimhon, Shaune Pascal, MD  apixaban (ELIQUIS) 5 MG TABS tablet Take 1 tablet (5 mg total) by mouth 2 (two) times daily. 08/24/16  Yes Jettie Booze E, NP  fluticasone furoate-vilanterol (BREO ELLIPTA) 100-25 MCG/INH AEPB Inhale 1 puff into the lungs daily.   Yes [provider]  furosemide (LASIX) 40 MG tablet TAKE 1 TABLET BY MOUTH EVERY DAY 06/13/17  Yes Bensimhon, Shaune Pascal, MD  hydrALAZINE (APRESOLINE) 50 MG tablet Take 1 tablet (50 mg total) by mouth 3 (three) times daily. 03/16/17  Yes Bensimhon, Shaune Pascal, MD  KLOR-CON M20 20 MEQ tablet TAKE 1 TABLET BY MOUTH TWICE A DAY 06/28/17  Yes Larey Dresser, MD  oxyCODONE-acetaminophen (PERCOCET/ROXICET) 5-325 MG tablet Take 1-2 tablets by mouth every 6 (six) hours as needed for severe pain. 07/25/17  Yes Antonietta Breach, PA-C  pantoprazole (PROTONIX) 40 MG tablet Take 40 mg by mouth daily.   Yes [provider]  promethazine (PHENERGAN) 25 MG tablet Take 1 tablet (25 mg total) by mouth every 6 (six) hours as needed for nausea or vomiting. 07/25/17  Yes Antonietta Breach, PA-C  sacubitril-valsartan (ENTRESTO) 97-103 MG Take 1 tablet by mouth 2 (two) times daily. 04/04/17  Yes Bensimhon, Shaune Pascal, MD  tamsulosin (FLOMAX) 0.4 MG CAPS capsule Take 1 capsule (0.4 mg total) by mouth at bedtime. 07/25/17  Yes Antonietta Breach, PA-C  ibuprofen (ADVIL,MOTRIN) 800 MG tablet Take 1 tablet (800 mg total) by mouth 3 (three)  times daily. 07/26/17   Terren Jandreau, Corene Cornea, MD  morphine (MSIR) 30 MG tablet Take 1 tablet (30 mg total) by mouth every 4 (four) hours as needed for severe pain. 07/26/17   Sussie Minor, Corene Cornea, MD  ondansetron (ZOFRAN) 4 MG tablet Take 1 tablet (4 mg total) by mouth every 8 (eight) hours as needed for nausea or vomiting. 07/26/17   Galileah Piggee, Corene Cornea, MD    Family History Family History  Problem Relation Age of Onset  . Hypertension Mother     Social History Social History   Tobacco Use  . Smoking status: Never  Smoker  . Smokeless tobacco: Never Used  Substance Use Topics  . Alcohol use: No  . Drug use: No     Allergies   Isosorbide; Lisinopril; and Digoxin and related   Review of Systems Review of Systems  Cardiovascular: Negative for chest pain.  Genitourinary: Positive for flank pain.  All other systems reviewed and are negative.    Physical Exam Updated Vital Signs BP 132/81   Pulse 64   Temp 99 F (37.2 C) (Oral)   Resp 19   Ht 6' (1.829 m)   Wt 104.3 kg (230 lb)   SpO2 92%   BMI 31.19 kg/m   Physical Exam  Constitutional: He is oriented to person, place, and time. He appears well-developed and well-nourished.  HENT:  Head: Normocephalic and atraumatic.  Eyes: Conjunctivae and EOM are normal.  Neck: Normal range of motion.  Cardiovascular: Normal rate.  Pulmonary/Chest: Effort normal. No respiratory distress.  Abdominal: Soft. Bowel sounds are normal. He exhibits no distension.  Musculoskeletal: Normal range of motion.  Neurological: He is alert and oriented to person, place, and time. No cranial nerve deficit. Coordination normal.  Skin: Skin is warm and dry.  Nursing note and vitals reviewed.    ED Treatments / Results  Labs (all labs ordered are listed, but only abnormal results are displayed) Labs Reviewed  CBC WITH DIFFERENTIAL/PLATELET - Abnormal; Notable for the following components:      Result Value   WBC 12.8 (*)    RBC 4.20 (*)    Hemoglobin 12.6 (*)    Neutro Abs 11.4 (*)    Lymphs Abs 0.4 (*)    All other components within normal limits  BASIC METABOLIC PANEL - Abnormal; Notable for the following components:   Glucose, Bld 116 (*)    Creatinine, Ser 2.75 (*)    Calcium 8.5 (*)    GFR calc non Af Amer 23 (*)    GFR calc Af Amer 27 (*)    All other components within normal limits  URINALYSIS, ROUTINE W REFLEX MICROSCOPIC - Abnormal; Notable for the following components:   APPearance HAZY (*)    Hgb urine dipstick SMALL (*)    Ketones, ur  5 (*)    Protein, ur 30 (*)    RBC / HPF >50 (*)    Bacteria, UA MANY (*)    All other components within normal limits    EKG None  Radiology Ct Abdomen Pelvis Wo Contrast  Result Date: 07/25/2017 CLINICAL DATA:  Left lower quadrant pain EXAM: CT ABDOMEN AND PELVIS WITHOUT CONTRAST TECHNIQUE: Multidetector CT imaging of the abdomen and pelvis was performed following the standard protocol without IV contrast. COMPARISON:  CT 05/05/2012 FINDINGS: Lower chest: Scarring at the right lung base. No acute consolidation or effusion. Heart size within normal limits. Moderate hiatal hernia. Hepatobiliary: No focal liver abnormality is seen. No gallstones, gallbladder wall thickening, or  biliary dilatation. Pancreas: Unremarkable. No pancreatic ductal dilatation or surrounding inflammatory changes. Spleen: Normal in size without focal abnormality. Adrenals/Urinary Tract: Adrenal glands are within normal limits. Bilateral parapelvic cysts. Moderate left hydronephrosis and proximal hydroureter, secondary to a 6 x 7 mm stone in the proximal left ureter about 2.5 cm distal to the left UPJ. Borderline wall thickening of the bladder Stomach/Bowel: Stomach is nonenlarged. No dilated small bowel. No colon wall thickening. Vascular/Lymphatic: Nonaneurysmal aorta. No significantly enlarged lymph nodes. Mild atherosclerosis. Reproductive: Enlarged prostate with mass effect on the posterior bladder. Other: Negative for free air or free fluid.  Prior hernia repair. Musculoskeletal: Degenerative changes. No acute or suspicious abnormality. IMPRESSION: 1. Moderate left hydronephrosis and proximal hydroureter, secondary to a 6 x 7 mm stone in proximal left ureter about 2.5 cm distal to the left UPJ 2. Multiple bilateral renal sinus cysts. 3. Prostatomegaly with mass effect on bladder 4. Moderate hiatal hernia Electronically Signed   By: Donavan Foil M.D.   On: 07/25/2017 01:36    Procedures Procedures (including critical care  time)  Medications Ordered in ED Medications  sodium chloride 0.9 % bolus 500 mL (0 mLs Intravenous Stopped 07/25/17 1924)  HYDROmorphone (DILAUDID) injection 1 mg (1 mg Intravenous Given 07/25/17 1855)  ondansetron (ZOFRAN) injection 4 mg (4 mg Intravenous Given 07/25/17 1855)  HYDROmorphone (DILAUDID) injection 1.5 mg (1.5 mg Intravenous Given 07/25/17 2145)  ketorolac (TORADOL) 30 MG/ML injection 30 mg (30 mg Intravenous Given 07/25/17 2146)  lactated ringers bolus 1,000 mL (0 mLs Intravenous Stopped 07/25/17 2246)  morphine (MSIR) tablet 15 mg (15 mg Oral Given 07/26/17 0028)     Initial Impression / Assessment and Plan / ED Course  I have reviewed the triage vital signs and the nursing notes.  Pertinent labs & imaging results that were available during my care of the patient were reviewed by me and considered in my medical decision making (see chart for details).     Attempt pain/nausea control. If not, admit. otherwise discharge.   Pain controlled for multiple hours. Will dc on same meds. Urology follow up.   Final Clinical Impressions(s) / ED Diagnoses   Final diagnoses:  Nephrolithiasis    ED Discharge Orders        Ordered    morphine (MSIR) 30 MG tablet  Every 4 hours PRN     07/26/17 0033    ondansetron (ZOFRAN) 4 MG tablet  Every 8 hours PRN     07/26/17 0033    ibuprofen (ADVIL,MOTRIN) 800 MG tablet  3 times daily     07/26/17 0033       Latrise Bowland, Corene Cornea, MD 07/26/17 [phone]

## 2017-07-26 NOTE — ED Notes (Signed)
Patient Alert and oriented to baseline. Stable and ambulatory to baseline. Patient verbalized understanding of the discharge instructions.  Patient belongings were taken by the patient.   

## 2017-07-29 ENCOUNTER — Ambulatory Visit (HOSPITAL_COMMUNITY): Payer: BLUE CROSS/BLUE SHIELD | Admitting: Certified Registered Nurse Anesthetist

## 2017-07-29 ENCOUNTER — Encounter (HOSPITAL_COMMUNITY): Payer: Self-pay | Admitting: Certified Registered Nurse Anesthetist

## 2017-07-29 ENCOUNTER — Encounter (HOSPITAL_COMMUNITY)
Admission: RE | Disposition: A | Discharge status: Home / Self Care | Payer: Self-pay | Source: Other Acute Inpatient Hospital | Attending: Urology

## 2017-07-29 ENCOUNTER — Ambulatory Visit (HOSPITAL_COMMUNITY): Payer: BLUE CROSS/BLUE SHIELD

## 2017-07-29 ENCOUNTER — Other Ambulatory Visit: Payer: Self-pay | Admitting: Urology

## 2017-07-29 ENCOUNTER — Ambulatory Visit (HOSPITAL_COMMUNITY)
Admission: RE | Admit: 2017-07-29 | Discharge: 2017-07-29 | Disposition: A | Discharge status: Home / Self Care | Payer: BLUE CROSS/BLUE SHIELD | Source: Other Acute Inpatient Hospital | Attending: Urology | Admitting: Urology

## 2017-07-29 DIAGNOSIS — I10 Essential (primary) hypertension: Secondary | ICD-10-CM | POA: Insufficient documentation

## 2017-07-29 DIAGNOSIS — N138 Other obstructive and reflux uropathy: Secondary | ICD-10-CM | POA: Insufficient documentation

## 2017-07-29 DIAGNOSIS — N400 Enlarged prostate without lower urinary tract symptoms: Secondary | ICD-10-CM | POA: Insufficient documentation

## 2017-07-29 DIAGNOSIS — I110 Hypertensive heart disease with heart failure: Secondary | ICD-10-CM | POA: Diagnosis not present

## 2017-07-29 DIAGNOSIS — K219 Gastro-esophageal reflux disease without esophagitis: Secondary | ICD-10-CM | POA: Diagnosis not present

## 2017-07-29 DIAGNOSIS — Z79899 Other long term (current) drug therapy: Secondary | ICD-10-CM | POA: Diagnosis not present

## 2017-07-29 DIAGNOSIS — J4530 Mild persistent asthma, uncomplicated: Secondary | ICD-10-CM | POA: Diagnosis not present

## 2017-07-29 DIAGNOSIS — N1330 Unspecified hydronephrosis: Secondary | ICD-10-CM | POA: Diagnosis not present

## 2017-07-29 DIAGNOSIS — E669 Obesity, unspecified: Secondary | ICD-10-CM | POA: Diagnosis not present

## 2017-07-29 DIAGNOSIS — N210 Calculus in bladder: Secondary | ICD-10-CM | POA: Diagnosis not present

## 2017-07-29 DIAGNOSIS — I5021 Acute systolic (congestive) heart failure: Secondary | ICD-10-CM | POA: Diagnosis not present

## 2017-07-29 DIAGNOSIS — Z6830 Body mass index (BMI) 30.0-30.9, adult: Secondary | ICD-10-CM | POA: Diagnosis not present

## 2017-07-29 DIAGNOSIS — Z888 Allergy status to other drugs, medicaments and biological substances status: Secondary | ICD-10-CM | POA: Diagnosis not present

## 2017-07-29 DIAGNOSIS — N132 Hydronephrosis with renal and ureteral calculous obstruction: Secondary | ICD-10-CM | POA: Diagnosis not present

## 2017-07-29 DIAGNOSIS — E782 Mixed hyperlipidemia: Secondary | ICD-10-CM | POA: Insufficient documentation

## 2017-07-29 DIAGNOSIS — N201 Calculus of ureter: Secondary | ICD-10-CM | POA: Diagnosis not present

## 2017-07-29 HISTORY — PX: CYSTOSCOPY WITH RETROGRADE PYELOGRAM, URETEROSCOPY AND STENT PLACEMENT: SHX5789

## 2017-07-29 LAB — PROTIME-INR
INR: 2.26
PROTHROMBIN TIME: 24.8 s — AB (ref 11.4–15.2)

## 2017-07-29 SURGERY — CYSTOURETEROSCOPY, WITH RETROGRADE PYELOGRAM AND STENT INSERTION
Anesthesia: General | Laterality: Left

## 2017-07-29 MED ORDER — EPHEDRINE 5 MG/ML INJ
INTRAVENOUS | Status: AC
  Filled 2017-07-29: qty 10

## 2017-07-29 MED ORDER — FENTANYL CITRATE (PF) 100 MCG/2ML IJ SOLN: 25.0000 ug | INTRAMUSCULAR | Status: DC | PRN

## 2017-07-29 MED ORDER — PROPOFOL 10 MG/ML IV BOLUS
INTRAVENOUS | Status: AC
  Filled 2017-07-29: qty 20

## 2017-07-29 MED ORDER — OXYBUTYNIN CHLORIDE 5 MG PO TABS: 5.0000 mg | ORAL_TABLET | Freq: Three times a day (TID) | ORAL | 1 refills | Status: DC | PRN

## 2017-07-29 MED ORDER — ONDANSETRON HCL 4 MG/2ML IJ SOLN
INTRAMUSCULAR | Status: DC | PRN
  Administered 2017-07-29: 4 mg via INTRAVENOUS

## 2017-07-29 MED ORDER — ONDANSETRON HCL 4 MG/2ML IJ SOLN
INTRAMUSCULAR | Status: AC
  Filled 2017-07-29: qty 2

## 2017-07-29 MED ORDER — EPHEDRINE SULFATE-NACL 50-0.9 MG/10ML-% IV SOSY
PREFILLED_SYRINGE | INTRAVENOUS | Status: DC | PRN
  Administered 2017-07-29: 10 mg via INTRAVENOUS

## 2017-07-29 MED ORDER — LIDOCAINE 2% (20 MG/ML) 5 ML SYRINGE
INTRAMUSCULAR | Status: AC
  Filled 2017-07-29: qty 5

## 2017-07-29 MED ORDER — PROPOFOL 10 MG/ML IV BOLUS
INTRAVENOUS | Status: DC | PRN
  Administered 2017-07-29: 200 mg via INTRAVENOUS

## 2017-07-29 MED ORDER — MIDAZOLAM HCL 2 MG/2ML IJ SOLN
INTRAMUSCULAR | Status: AC
  Filled 2017-07-29: qty 2

## 2017-07-29 MED ORDER — ONDANSETRON HCL 4 MG/2ML IJ SOLN: 4.0000 mg | Freq: Once | INTRAMUSCULAR | Status: DC | PRN

## 2017-07-29 MED ORDER — CEPHALEXIN 500 MG PO CAPS: 500.0000 mg | ORAL_CAPSULE | Freq: Two times a day (BID) | ORAL | 0 refills | Status: DC

## 2017-07-29 MED ORDER — LIDOCAINE 2% (20 MG/ML) 5 ML SYRINGE
INTRAMUSCULAR | Status: DC | PRN
  Administered 2017-07-29: 80 mg via INTRAVENOUS

## 2017-07-29 MED ORDER — SODIUM CHLORIDE 0.9 % IR SOLN
Status: DC | PRN
  Administered 2017-07-29: 3000 mL

## 2017-07-29 MED ORDER — DEXAMETHASONE SODIUM PHOSPHATE 10 MG/ML IJ SOLN
INTRAMUSCULAR | Status: DC | PRN
  Administered 2017-07-29: 10 mg via INTRAVENOUS

## 2017-07-29 MED ORDER — FENTANYL CITRATE (PF) 100 MCG/2ML IJ SOLN
INTRAMUSCULAR | Status: DC | PRN
  Administered 2017-07-29: 25 ug via INTRAVENOUS

## 2017-07-29 MED ORDER — LACTATED RINGERS IV SOLN
INTRAVENOUS | Status: DC
  Administered 2017-07-29: 11:00:00 via INTRAVENOUS

## 2017-07-29 MED ORDER — CEFAZOLIN SODIUM-DEXTROSE 2-4 GM/100ML-% IV SOLN
2.0000 g | INTRAVENOUS | Status: AC
  Administered 2017-07-29: 2 g via INTRAVENOUS
  Filled 2017-07-29: qty 100

## 2017-07-29 MED ORDER — IOHEXOL 300 MG/ML  SOLN
INTRAMUSCULAR | Status: DC | PRN
  Administered 2017-07-29: 6 mL

## 2017-07-29 MED ORDER — FENTANYL CITRATE (PF) 100 MCG/2ML IJ SOLN
INTRAMUSCULAR | Status: AC
  Filled 2017-07-29: qty 2

## 2017-07-29 SURGICAL SUPPLY — 24 items
BAG URO CATCHER STRL LF (MISCELLANEOUS) ×2 IMPLANT
BASKET LASER NITINOL 1.9FR (BASKET) ×1 IMPLANT
BASKET ZERO TIP NITINOL 2.4FR (BASKET) IMPLANT
BSKT STON RTRVL 120 1.9FR (BASKET)
BSKT STON RTRVL ZERO TP 2.4FR (BASKET)
CATH INTERMIT  6FR 70CM (CATHETERS) ×1 IMPLANT
CLOTH BEACON ORANGE TIMEOUT ST (SAFETY) ×2 IMPLANT
COVER FOOTSWITCH UNIV (MISCELLANEOUS) ×1 IMPLANT
COVER SURGICAL LIGHT HANDLE (MISCELLANEOUS) ×2 IMPLANT
FIBER LASER FLEXIVA 365 (UROLOGICAL SUPPLIES) IMPLANT
FIBER LASER TRAC TIP (UROLOGICAL SUPPLIES) IMPLANT
GLOVE BIOGEL M 8.0 STRL (GLOVE) ×2 IMPLANT
GOWN STRL REUS W/ TWL XL LVL3 (GOWN DISPOSABLE) ×1 IMPLANT
GOWN STRL REUS W/TWL LRG LVL3 (GOWN DISPOSABLE) ×4 IMPLANT
GOWN STRL REUS W/TWL XL LVL3 (GOWN DISPOSABLE) ×2
GUIDEWIRE ANG ZIPWIRE 038X150 (WIRE) IMPLANT
GUIDEWIRE STR DUAL SENSOR (WIRE) ×2 IMPLANT
IV NS 1000ML (IV SOLUTION)
IV NS 1000ML BAXH (IV SOLUTION) ×1 IMPLANT
MANIFOLD NEPTUNE II (INSTRUMENTS) ×2 IMPLANT
PACK CYSTO (CUSTOM PROCEDURE TRAY) ×2 IMPLANT
STENT CONTOUR 6FRX26X.038 (STENTS) ×1 IMPLANT
TUBING CONNECTING 10 (TUBING) ×2 IMPLANT
TUBING UROLOGY SET (TUBING) ×1 IMPLANT

## 2017-07-29 NOTE — Op Note (Signed)
Preoperative diagnosis: Obstructing left upper ureteral stone with hydronephrosis and recurrent pain  Postoperative diagnosis: Same  Principal procedure: Cystoscopy, left retrograde ureteropyelogram, fluoroscopic interpretation, placement of 6 French by 26 cm contour double-J stent without tether  Surgeon: Peytin Dechert  Anesthesia: General with LMA  Complications: None  Specimen: None  Estimated blood loss: None  Indications: 62 year old male with recurrent pain from a 6 mm obstructing left upper ureteral stone with hydroureteronephrosis.  He has had at least 2 visits to the emergency room with recent presentation this morning to our office with recurrent pain and hypotension, more than likely from high doses of MS Contin given by the emergency room.  It was recommended that he have a stent placed today with management of his stone with shockwave lithotripsy at a later date, as he has been on Eliquis.  He is aware of the procedure of cystoscopy and stent extraction, risks and complications.  These include but are not limited to bleeding, infection, ureteral injury, anesthetic complications among others.  He understands and desires to proceed.  Findings: Bladder was normal with the exception of 2-3 small calculi that were removed.  Urothelium was normal, ureteral orifices were normal in configuration location.  Prostate was obstructive  with trilobar hypertrophy.  Mid and distal ureter were normal in appearance on retrograde, but the proximal ureter was tortuous and hydronephrotic secondary to a filling defect consistent with the previously mentioned stone.  Pyelocalyceal system revealed pyelocaliectasis.  Description of procedure: The patient was properly identified and marked in the holding area.  He received preoperative IV antibiotics.  Was taken to the operating room where general anesthetic was administered.  He was placed in the dorsolithotomy position.  Genitalia and perineum were prepped and  draped, proper timeout performed.  65 French panendoscope advanced under direct vision through his urethra and into his bladder.  Above-mentioned findings were noted.  6 [place] open-ended catheter was utilized for retrograde study with Omnipaque with the above-mentioned findings.  Following this, a sensor tip guidewire was easily passed through the open-ended catheter, passed the stone and up into the upper calyceal system where a curl was seen.  The open-ended catheter was removed and a 6 [place] by 26 cm contour double-J stent was deployed in the left ureter with good proximal and distal curl seen using fluoroscopy and cystoscopy, respectively.  The bladder was then drained.  The scope was removed.  The patient was then awakened, taken to the PACU in stable condition.  He tolerated the procedure well.

## 2017-07-29 NOTE — H&P (Signed)
H&P  Chief Complaint: left-sided kidney stone  History of Present Illness: 62 year old male presents at this time for cystoscopy and stenting for urgent management of a non-progressing left midureteral stone. CT performed on 7.1.2019 revealed a 6 mm left midureteral stone with hydronephrosis.  The patient has had need for increasing narcotic medications, and presented today with persistent pain.  Additionally, he had low blood pressure and lethargy secondary to his morphine dosing.  Because of this, it was recommended he undergo urgent management.  He is on Eliquis, and at this point I felt it best just to proceed with stent management treatment of his obstruction, with further treatment with lithotripsy or ureteroscopic with laserat a later date.  Past Medical History:  Diagnosis Date  . Asthma, persistent   . BPH (benign prostatic hyperplasia) 2014  . Combined hyperlipidemia   . GERD (gastroesophageal reflux disease)   . Hypertension   . 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 Obesity   . Renal stone 04/2012  . Seasonal allergic rhinitis     Past Surgical History:  Procedure Laterality Date  . CARDIOVERSION N/A 04/23/2016   Procedure: CARDIOVERSION;  Surgeon: Jolaine Artist, MD;  Location: [facility] ENDOSCOPY;  Service: Cardiovascular;  Laterality: N/A;  . TEE WITHOUT CARDIOVERSION N/A 04/23/2016   Procedure: TRANSESOPHAGEAL ECHOCARDIOGRAM (TEE);  Surgeon: Jolaine Artist, MD;  Location: [facility] ENDOSCOPY;  Service: Cardiovascular;  Laterality: N/A;    Home Medications:    Allergies:  Allergies  Allergen Reactions  . Isosorbide Other (See Comments)    HEADACHES from Imdur  . Lisinopril Cough  . Digoxin And Related Rash    Family History  Problem Relation Age of Onset  . Hypertension Mother     Social History:  reports that he has never smoked. He has never used smokeless tobacco. He reports that he does not drink alcohol or use drugs.  ROS: A complete review of systems  was performed.  All systems are negative except for pertinent findings as noted.  Physical Exam:  Vital signs in last 24 hours: Temp:  [98.3 F (36.8 C)] 98.3 F (36.8 C) (07/05 0954) Pulse Rate:  [75] 75 (07/05 0954) Resp:  [18] 18 (07/05 0954) BP: (98)/(66) 98/66 (07/05 0954) SpO2:  [93 %] 93 % (07/05 0954) Weight:  [102.6 kg (226 lb 2 oz)] 102.6 kg (226 lb 2 oz) (07/05 0954) Constitutional:  Alert and oriented, No acute distress Cardiovascular: Regular rate  Respiratory: Normal respiratory effort GI: Abdomen is soft, nontender, nondistended, no abdominal masses Genitourinary: Left CVAT. Normal male phallus, testes are descended bilaterally and non-tender and without masses, scrotum is normal in appearance without lesions or masses, perineum is normal on inspection. Lymphatic: No lymphadenopathy Neurologic: Grossly intact, no focal deficits Psychiatric: Normal mood and affect  Laboratory Data:  No results for input(s): WBC, HGB, HCT, PLT in the last 72 hours.  No results for input(s): NA, K, CL, GLUCOSE, BUN, CALCIUM, CREATININE in the last 72 hours.  Invalid input(s): CO3   No results found for this or any previous visit (from the past 24 hour(s)). No results found for this or any previous visit (from the past 240 hour(s)).  Renal Function: Recent Labs    07/24/17 2130 07/25/17 2026  CREATININE 2.37* 2.75*   Estimated Creatinine Clearance: 34.5 mL/min (A) (by C-G formula based on SCr of 2.75 mg/dL (H)).  Radiologic Imaging: No results found.  Impression/Assessment:  Non-progressing left upper ureteral stone with worsening pain  Plan:  Cystoscopy, left  retrograde ureteropyelogram, left double-J stent placement.

## 2017-07-29 NOTE — Anesthesia Preprocedure Evaluation (Addendum)
Anesthesia Evaluation  Patient identified by MRN, date of birth, ID band Patient awake    Reviewed: Allergy & Precautions, NPO status , Patient's Chart, lab work & pertinent test results  Airway Mallampati: II  TM Distance: >3 FB Neck ROM: Full    Dental  (+) Dental Advisory Given   Pulmonary asthma ,    breath sounds clear to auscultation       Cardiovascular hypertension, Pt. on medications +CHF  + dysrhythmias  Rhythm:Regular Rate:Normal     Neuro/Psych negative neurological ROS     GI/Hepatic Neg liver ROS, GERD  ,  Endo/Other  negative endocrine ROS  Renal/GU Renal InsufficiencyRenal disease     Musculoskeletal   Abdominal   Peds  Hematology negative hematology ROS (+)   Anesthesia Other Findings   Reproductive/Obstetrics                            Anesthesia Physical Anesthesia Plan  ASA: III  Anesthesia Plan: General   Post-op Pain Management:    Induction: Intravenous  PONV Risk Score and Plan: 2 and Dexamethasone, Ondansetron and Treatment may vary due to age or medical condition  Airway Management Planned: LMA  Additional Equipment:   Intra-op Plan:   Post-operative Plan: Extubation in OR  Informed Consent: I have reviewed the patients History and Physical, chart, labs and discussed the procedure including the risks, benefits and alternatives for the proposed anesthesia with the patient or authorized representative who has indicated his/her understanding and acceptance.   Dental advisory given  Plan Discussed with: CRNA  Anesthesia Plan Comments:        Anesthesia Quick Evaluation

## 2017-07-29 NOTE — Transfer of Care (Signed)
Immediate Anesthesia Transfer of Care Note  Patient: [name]  Procedure(s) Performed: CYSTOSCOPY WITH RETROGRADE PYELOGRAM AND LEFT STENT PLACEMENT (Left )  Patient Location: PACU  Anesthesia Type:General  Level of Consciousness: awake, alert  and oriented  Airway & Oxygen Therapy: Patient Spontanous Breathing and Patient connected to face mask oxygen  Post-op Assessment: Report given to RN  Post vital signs: Reviewed and stable  Last Vitals:  Vitals Value Taken Time  BP 105/61 07/29/2017 12:24 PM  Temp    Pulse 51 07/29/2017 12:27 PM  Resp 12 07/29/2017 12:27 PM  SpO2 99 % 07/29/2017 12:27 PM  Vitals shown include unvalidated device data.  Last Pain:  Vitals:   07/29/17 1040  TempSrc:   PainSc: 0-No pain         Complications: No apparent anesthesia complications

## 2017-07-29 NOTE — Anesthesia Procedure Notes (Signed)
Procedure Name: LMA Insertion Date/Time: 07/29/2017 11:54 AM Performed by: Maxwell Caul, CRNA Pre-anesthesia Checklist: Patient identified, Emergency Drugs available, Suction available and Patient being monitored Patient Re-evaluated:Patient Re-evaluated prior to induction Oxygen Delivery Method: Circle system utilized Preoxygenation: Pre-oxygenation with 100% oxygen Induction Type: IV induction LMA: LMA inserted LMA Size: 5.0 Number of attempts: 1 Placement Confirmation: positive ETCO2 and breath sounds checked- equal and bilateral Tube secured with: Tape Dental Injury: Teeth and Oropharynx as per pre-operative assessment

## 2017-07-29 NOTE — Interval H&P Note (Signed)
History and Physical Interval Note:  07/29/2017 11:42 AM  [name]  has presented today for surgery, with the diagnosis of left ureteral stone  The various methods of treatment have been discussed with the patient and family. After consideration of risks, benefits and other options for treatment, the patient has consented to  Procedure(s): CYSTOSCOPY WITH RETROGRADE PYELOGRAM AND LEFT STENT PLACEMENT (Left) as a surgical intervention .  The patient's history has been reviewed, patient examined, no change in status, stable for surgery.  I have reviewed the patient's chart and labs.  Questions were answered to the patient's satisfaction.     Lillette Boxer Lonzo Saulter

## 2017-07-29 NOTE — Anesthesia Postprocedure Evaluation (Signed)
Anesthesia Post Note  Patient: [name]  Procedure(s) Performed: CYSTOSCOPY WITH RETROGRADE PYELOGRAM AND LEFT STENT PLACEMENT (Left )     Patient location during evaluation: PACU Anesthesia Type: General Level of consciousness: awake and alert Pain management: pain level controlled Vital Signs Assessment: post-procedure vital signs reviewed and stable Respiratory status: spontaneous breathing, nonlabored ventilation, respiratory function stable and patient connected to nasal cannula oxygen Cardiovascular status: blood pressure returned to baseline and stable Postop Assessment: no apparent nausea or vomiting Anesthetic complications: no    Last Vitals:  Vitals:   07/29/17 1245 07/29/17 1300  BP: 107/65 97/64  Pulse: (!) 48 (!) 48  Resp: 12 15  Temp:  36.6 C  SpO2: 97% 94%    Last Pain:  Vitals:   07/29/17 1300  TempSrc:   PainSc: 0-No pain                 Tiajuana Amass

## 2017-07-29 NOTE — Discharge Instructions (Signed)
1. You may see some blood in the urine and may have some burning with urination for 48-72 hours. You also may notice that you have to urinate more frequently or urgently after your procedure which is normal.  °2. You should call should you develop an inability urinate, fever > 101, persistent nausea and vomiting that prevents you from eating or drinking to stay hydrated.  °3. If you have a stent, you will likely urinate more frequently and urgently until the stent is removed and you may experience some discomfort/pain in the lower abdomen and flank especially when urinating. You may take pain medication prescribed to you if needed for pain. You may also intermittently have blood in the urine until the stent is removed. °4. If you have a catheter, you will be taught how to take care of the catheter by the nursing staff prior to discharge from the hospital.  You may periodically feel a strong urge to void with the catheter in place.  This is a bladder spasm and most often can occur when having a bowel movement or moving around. It is typically self-limited and usually will stop after a few minutes.  You may use some Vaseline or Neosporin around the tip of the catheter to reduce friction at the tip of the penis. You may also see some blood in the urine.  A very small amount of blood can make the urine look quite red.  As long as the catheter is draining well, there usually is not a problem.  However, if the catheter is not draining well and is bloody, you should call the office ([phone]) to notify us. ° ° °General Anesthesia, Adult, Care After °These instructions provide you with information about caring for yourself after your procedure. Your health care provider may also give you more specific instructions. Your treatment has been planned according to current medical practices, but problems sometimes occur. Call your health care provider if you have any problems or questions after your procedure. °What can I  expect after the procedure? °After the procedure, it is common to have: °· Vomiting. °· A sore throat. °· Mental slowness. ° °It is common to feel: °· Nauseous. °· Cold or shivery. °· Sleepy. °· Tired. °· Sore or achy, even in parts of your body where you did not have surgery. ° °Follow these instructions at home: °For at least 24 hours after the procedure: °· Do not: °? Participate in activities where you could fall or become injured. °? Drive. °? Use heavy machinery. °? Drink alcohol. °? Take sleeping pills or medicines that cause drowsiness. °? Make important decisions or sign legal documents. °? Take care of children on your own. °· Rest. °Eating and drinking °· If you vomit, drink water, juice, or soup when you can drink without vomiting. °· Drink enough fluid to keep your urine clear or pale yellow. °· Make sure you have little or no nausea before eating solid foods. °· Follow the diet recommended by your health care provider. °General instructions °· Have a responsible adult stay with you until you are awake and alert. °· Return to your normal activities as told by your health care provider. Ask your health care provider what activities are safe for you. °· Take over-the-counter and prescription medicines only as told by your health care provider. °· If you smoke, do not smoke without supervision. °· Keep all follow-up visits as told by your health care provider. This is important. °Contact a health care provider if: °·   You continue to have nausea or vomiting at home, and medicines are not helpful. °· You cannot drink fluids or start eating again. °· You cannot urinate after 8-12 hours. °· You develop a skin rash. °· You have fever. °· You have increasing redness at the site of your procedure. °Get help right away if: °· You have difficulty breathing. °· You have chest pain. °· You have unexpected bleeding. °· You feel that you are having a life-threatening or urgent problem. °This information is not intended  to replace advice given to you by your health care provider. Make sure you discuss any questions you have with your health care provider. °Document Released: 04/19/2000 Document Revised: 06/16/2015 Document Reviewed: 12/26/2014 °Elsevier Interactive Patient Education © 2018 [employer] ° °

## 2017-07-30 ENCOUNTER — Encounter (HOSPITAL_COMMUNITY): Payer: Self-pay | Admitting: Urology

## 2017-08-02 ENCOUNTER — Other Ambulatory Visit: Payer: Self-pay | Admitting: Urology

## 2017-08-03 ENCOUNTER — Encounter (HOSPITAL_COMMUNITY): Payer: Self-pay | Admitting: General Practice

## 2017-08-03 ENCOUNTER — Other Ambulatory Visit: Payer: Self-pay | Admitting: Urology

## 2017-08-03 NOTE — Progress Notes (Signed)
Please place lithotripsy orders in epic; pts procedure is scheduled for Monday July 15th. Thanks.

## 2017-08-04 ENCOUNTER — Telehealth (HOSPITAL_COMMUNITY): Payer: Self-pay | Admitting: *Deleted

## 2017-08-04 ENCOUNTER — Other Ambulatory Visit: Payer: Self-pay | Admitting: Urology

## 2017-08-04 DIAGNOSIS — N201 Calculus of ureter: Secondary | ICD-10-CM | POA: Diagnosis not present

## 2017-08-04 NOTE — Telephone Encounter (Signed)
Discussed with MD. Madaline Brilliant to hold. Resume at previous dose post procedure.    Legrand Como [address] [place], PA-C 08/04/2017 11:32 AM

## 2017-08-04 NOTE — Telephone Encounter (Signed)
Patient is having kidney stone removed on Friday.  Urology is asking if he can hold Eliquis for 48 hours prior.   Will forward to Barrington Ellison, PA to review and will call patient back.

## 2017-08-04 NOTE — Telephone Encounter (Signed)
Called patient back and he is aware to hold Eliquis 48 hours prior and restart post procedure.  No further questions.

## 2017-08-08 ENCOUNTER — Encounter (HOSPITAL_COMMUNITY): Payer: Self-pay | Admitting: *Deleted

## 2017-08-08 ENCOUNTER — Encounter (HOSPITAL_COMMUNITY)
Admission: RE | Disposition: A | Discharge status: Home / Self Care | Payer: Self-pay | Source: Ambulatory Visit | Attending: Urology

## 2017-08-08 ENCOUNTER — Ambulatory Visit (HOSPITAL_COMMUNITY)
Admission: RE | Admit: 2017-08-08 | Discharge: 2017-08-08 | Disposition: A | Discharge status: Home / Self Care | Payer: BLUE CROSS/BLUE SHIELD | Source: Ambulatory Visit | Attending: Urology | Admitting: Urology

## 2017-08-08 ENCOUNTER — Other Ambulatory Visit: Payer: Self-pay

## 2017-08-08 ENCOUNTER — Ambulatory Visit (HOSPITAL_COMMUNITY): Payer: BLUE CROSS/BLUE SHIELD

## 2017-08-08 DIAGNOSIS — Z79899 Other long term (current) drug therapy: Secondary | ICD-10-CM | POA: Insufficient documentation

## 2017-08-08 DIAGNOSIS — Z6831 Body mass index (BMI) 31.0-31.9, adult: Secondary | ICD-10-CM | POA: Insufficient documentation

## 2017-08-08 DIAGNOSIS — N201 Calculus of ureter: Secondary | ICD-10-CM | POA: Diagnosis not present

## 2017-08-08 DIAGNOSIS — N132 Hydronephrosis with renal and ureteral calculous obstruction: Secondary | ICD-10-CM | POA: Diagnosis not present

## 2017-08-08 DIAGNOSIS — I110 Hypertensive heart disease with heart failure: Secondary | ICD-10-CM | POA: Insufficient documentation

## 2017-08-08 DIAGNOSIS — Z8249 Family history of ischemic heart disease and other diseases of the circulatory system: Secondary | ICD-10-CM | POA: Diagnosis not present

## 2017-08-08 DIAGNOSIS — J45909 Unspecified asthma, uncomplicated: Secondary | ICD-10-CM | POA: Diagnosis not present

## 2017-08-08 DIAGNOSIS — Z7901 Long term (current) use of anticoagulants: Secondary | ICD-10-CM | POA: Diagnosis not present

## 2017-08-08 DIAGNOSIS — Z888 Allergy status to other drugs, medicaments and biological substances status: Secondary | ICD-10-CM | POA: Insufficient documentation

## 2017-08-08 DIAGNOSIS — E669 Obesity, unspecified: Secondary | ICD-10-CM | POA: Insufficient documentation

## 2017-08-08 DIAGNOSIS — I509 Heart failure, unspecified: Secondary | ICD-10-CM | POA: Insufficient documentation

## 2017-08-08 DIAGNOSIS — K219 Gastro-esophageal reflux disease without esophagitis: Secondary | ICD-10-CM | POA: Diagnosis not present

## 2017-08-08 HISTORY — DX: Unspecified atrial fibrillation: I48.91

## 2017-08-08 HISTORY — PX: EXTRACORPOREAL SHOCK WAVE LITHOTRIPSY: SHX1557

## 2017-08-08 HISTORY — DX: Heart failure, unspecified: I50.9

## 2017-08-08 HISTORY — DX: Personal history of urinary calculi: Z87.442

## 2017-08-08 HISTORY — DX: Sleep apnea, unspecified: G47.30

## 2017-08-08 SURGERY — LITHOTRIPSY, ESWL
Anesthesia: LOCAL | Laterality: Left

## 2017-08-08 MED ORDER — LEVOFLOXACIN 500 MG PO TABS
500.0000 mg | ORAL_TABLET | ORAL | Status: AC
  Administered 2017-08-08: 500 mg via ORAL
  Filled 2017-08-08: qty 1

## 2017-08-08 MED ORDER — DIPHENHYDRAMINE HCL 25 MG PO CAPS
25.0000 mg | ORAL_CAPSULE | ORAL | Status: AC
  Administered 2017-08-08: 25 mg via ORAL
  Filled 2017-08-08: qty 1

## 2017-08-08 MED ORDER — DIAZEPAM 5 MG PO TABS
10.0000 mg | ORAL_TABLET | ORAL | Status: AC
  Administered 2017-08-08: 10 mg via ORAL
  Filled 2017-08-08: qty 2

## 2017-08-08 MED ORDER — SODIUM CHLORIDE 0.9 % IV SOLN
INTRAVENOUS | Status: DC
  Administered 2017-08-08: 08:00:00 via INTRAVENOUS

## 2017-08-08 MED ORDER — APIXABAN 5 MG PO TABS: 5.0000 mg | ORAL_TABLET | Freq: Two times a day (BID) | ORAL | 3 refills | Status: DC

## 2017-08-08 NOTE — H&P (Signed)
CC: I have a history of kidney stones.  HPI: [name] is a 62 year-old male established patient who is here for F/U due to a history of renal calculi.  52mm left proximal ureteral calculi with upstream hydroureteronephrosis. Elevation of creatinine from baseline, (2.37) 1.96 months ago.   He reports awakening with progressively worsening left lower quadrant pain prompting emergency department evaluation. Presented to the emergency department for evaluation with CT imaging was performed. In addition to the obstructing calculi there was noted bilateral renal pelvis cystic structures as well as prostatomegaly with an and attention into the base of the bladder. Patient previously taking tamsulosin for obstructive voiding symptoms. Also prescribed Percocet and Phenergan. He reports pain controlled when taking analgesia but he has taken the medication around-the-clock since hospital discharge. He appears a little uncomfortable today. Denies changes in voiding habits. Reports no increased frequency or urgency, painful urination, gross hematuria. He is afebrile.   Pt on amiodarone, Entresto, and Eliquis was from his cardiologist.    07/29/17: Seen again in the ED on 07/01 for pain management. His creatinine had risen to 2.75. He was given IR PO morphine. Pain now controlled but pt is hypotensive with associated dizziness/lightheadedness. No CV symptoms.    08/04/17: Returns today, he is scheduled for ESWL on the 15th. He had a stent placed by Dr Diona Fanti last Friday. No fevers in the interval. He's had a mixture of irritating and obstructive voiding symptoms related to the stent but is emptying his bladder nevertheless. Pain is controlled with oral morphine and percocet. No interval stone material passage. He is scheduled to d/c his Eliquis after tomorrow's dose.   The patient was last seen [facility]. The patient's stone was on his left side. He did not pass a stone since the last office visit.   The patient  has had flank pain since they were last seen. The patient has developed frequency, urgency, and intermittency. He has no seen blood in his urine since the last visit. He is not currently having flank pain, back pain, groin pain, nausea, vomiting, fever or chills. He has had Ureteral Stent for treatment of his stones in the past.   08/08/2017: Negative urine culture. Presents for L ESWL today.    ALLERGIES: Digoxin Imdur Lisinopril - Cough    MEDICATIONS: Tamsulosin Hcl 0.4 mg capsule 1 capsule PO Q HS  Amiodarone Hcl 200 mg tablet 1 tablet PO BID  Apresoline  Breo Ellipta 100 mcg-25 mcg/dose blister, with inhalation device 1 puff Puff Daily  Eliquis 5 mg tablet 1 tablet PO BID  Entresto  Furosemide 40 mg tablet  Klor-Con M20 20 meq tablet, ext release, particles/crystals 1 tablet PO BID  Oxycodone-Acetaminophen 5 mg-325 mg tablet 1 tablet PO Q 6 H PRN  Pantoprazole Sodium 40 mg tablet, delayed release 1 tablet Oral Daily  ProAir HFA AERS 2 puff Inhalation Q 6 H PRN  Tylenol     GU PSH: None     PSH Notes: nose Surgery   NON-GU PSH: Diagnostic Colonoscopy - 2009 Hernia Repair - 2009    GU PMH: LLQ pain - 07/25/2017 Ureteral calculus, Left - 07/25/2017 BPH w/LUTS - 09/29/2016, (Worsening), - 08/08/2015, Benign prostatic hyperplasia with urinary obstruction, - 2016 Elevated PSA - 09/29/2016, - 05/03/2016 (Stable), - 08/08/2015, Elevated prostate specific antigen (PSA), - 2016 Nocturia - 05/03/2016 Straining on Urination - 05/03/2016 Urinary Urgency - 05/03/2016 Hematospermia, Hematospermia - 2016 ED due to arterial insufficiency, Erectile dysfunction due to arterial insufficiency - 2014  Encounter for Prostate Cancer screening, Prostate cancer screening - 2014 Renal calculus, Nephrolithiasis - 2014 Renal cyst, Renal cyst, acquired - 2014 Testicular pain, unspecified, Testicular pain - 2014    NON-GU PMH: Encounter for general adult medical examination without abnormal findings, Encounter for  preventive health examination - 2016 Asthma, Asthma - 2014 Personal history of other diseases of the circulatory system, History of hypertension - 2014 Personal history of other diseases of the digestive system, History of esophageal reflux - 2014    FAMILY HISTORY: Acute Myocardial Infarction - Father Death In The Family Father - Runs In Family Death In The Family Mother - Runs In Family Family Health Status Number - Runs In Family Palpitations - Runs In Family Prostate Cancer - Father   SOCIAL HISTORY: Marital Status: Married Preferred Language: English; Ethnicity: Not Hispanic Or Latino; Race: White Current Smoking Status: Patient has never smoked.  Has never drank.  Does not drink caffeine.    REVIEW OF SYSTEMS:    GU Review Male:   Patient reports frequent urination, hard to postpone urination, and burning/ pain with urination. Patient denies get up at night to urinate, leakage of urine, stream starts and stops, trouble starting your stream, have to strain to urinate , erection problems, and penile pain.  Gastrointestinal (Upper):   Patient reports nausea and vomiting. Patient denies indigestion/ heartburn.  Gastrointestinal (Lower):   Patient reports diarrhea and constipation.   Constitutional:   Patient denies weight loss, night sweats, fatigue, and fever.  Skin:   Patient denies skin rash/ lesion and itching.  Eyes:   Patient denies blurred vision and double vision.  Ears/ Nose/ Throat:   Patient denies sore throat and sinus problems.  Hematologic/Lymphatic:   Patient denies swollen glands and easy bruising.  Cardiovascular:   Patient denies leg swelling and chest pains.  Respiratory:   Patient denies cough and shortness of breath.  Endocrine:   Patient denies excessive thirst.  Musculoskeletal:   Patient reports back pain. Patient denies joint pain.  Neurological:   Patient reports headaches and dizziness.   Psychologic:   Patient denies depression and anxiety.   VITAL  SIGNS:      08/04/2017 01:23 PM  Weight 230 lb / 104.33 kg  Height 72 in / 182.88 cm  BP 92/57 mmHg  Heart Rate 51 /min  Temperature 97.7 F / 36.5 C  BMI 31.2 kg/m   MULTI-SYSTEM PHYSICAL EXAMINATION:    Constitutional: Well-nourished. No physical deformities. Normally developed. Good grooming.  Neck: Neck symmetrical, not swollen. Normal tracheal position.  Respiratory: No labored breathing, no use of accessory muscles. Normal breath sounds.  Cardiovascular: Regular rate and rhythm. No murmur, no gallop. Normal temperature, normal extremity pulses, no swelling, no varicosities.  Skin: No paleness, no jaundice, no cyanosis. No lesion, no ulcer, no rash.  Neurologic / Psychiatric: Oriented to time, oriented to place, oriented to person. No depression, no anxiety, no agitation.  Gastrointestinal: No mass, no tenderness, no rigidity, non obese abdomen. No CVAT, no flank or suprapubic tenderness.  Musculoskeletal: Normal gait and station of head and neck.     PAST DATA REVIEWED:  Source Of History:  Patient  Records Review:   Previous Hospital Records, Previous Patient Records  Urine Test Review:   Urinalysis  X-Ray Review: KUB: Reviewed Films. Reviewed Report.  C.T. Stone Protocol: Reviewed Films. Reviewed Report.     09/22/16 08/08/15 12/26/14 12/11/12 06/07/12 04/28/12  PSA  Total PSA 5.42 ng/mL 5.71  5.42  9.63  4.73  4.66   Free PSA 2.13 ng/mL   1.23  1.21    % Free PSA 39 % PSA   13  26      08/04/17  Urinalysis  Urine Appearance Cloudy   Urine Color Amber   Urine Glucose Neg mg/dL  Urine Bilirubin Neg mg/dL  Urine Ketones Neg mg/dL  Urine Specific Gravity 1.025   Urine Blood 3+ ery/uL  Urine pH 5.5   Urine Protein 3+ mg/dL  Urine Urobilinogen 0.2 mg/dL  Urine Nitrites Neg   Urine Leukocyte Esterase 2+ leu/uL  Urine WBC/hpf 10 - 20/hpf   Urine RBC/hpf >60/hpf   Urine Epithelial Cells NS (Not Seen)   Urine Bacteria Rare (0-9/hpf)   Urine Mucous Not Present    Urine Yeast NS (Not Seen)   Urine Trichomonas Not Present   Urine Cystals NS (Not Seen)   Urine Casts NS (Not Seen)   Urine Sperm Not Present    PROCEDURES:          Urinalysis w/Scope Dipstick Dipstick Cont'd Micro  Color: Amber Bilirubin: Neg mg/dL WBC/hpf: 10 - 20/hpf  Appearance: Cloudy Ketones: Neg mg/dL RBC/hpf: >60/hpf  Specific Gravity: 1.025 Blood: 3+ ery/uL Bacteria: Rare (0-9/hpf)  pH: 5.5 Protein: 3+ mg/dL Cystals: NS (Not Seen)  Glucose: Neg mg/dL Urobilinogen: 0.2 mg/dL Casts: NS (Not Seen)    Nitrites: Neg Trichomonas: Not Present    Leukocyte Esterase: 2+ leu/uL Mucous: Not Present      Epithelial Cells: NS (Not Seen)      Yeast: NS (Not Seen)      Sperm: Not Present    ASSESSMENT:      ICD-10 Details  1 GU:   Ureteral calculus - N20.1 Left   PLAN:           Orders Labs Urine Culture          Schedule Return Visit/Planned Activity: Keep Scheduled Appointment - Schedule Surgery          Document Letter(s):  Created for Patient: Clinical Summary         Notes:  Patient s/p L ureteral stent who presents for L ESWL today

## 2017-08-08 NOTE — Discharge Instructions (Signed)
Lithotripsy, Care After °This sheet gives you information about how to care for yourself after your procedure. Your health care provider may also give you more specific instructions. If you have problems or questions, contact your health care provider. °What can I expect after the procedure? °After the procedure, it is common to have: °· Some blood in your urine. This should only last for a few days. °· Soreness in your back, sides, or upper abdomen for a few days. °· Blotches or bruises on your back where the pressure wave entered the skin. °· Pain, discomfort, or nausea when pieces (fragments) of the kidney stone move through the tube that carries urine from the kidney to the bladder (ureter). Stone fragments may pass soon after the procedure, but they may continue to pass for up to 4-8 weeks. °? If you have severe pain or nausea, contact your health care provider. This may be caused by a large stone that was not broken up, and this may mean that you need more treatment. °· Some pain or discomfort during urination. °· Some pain or discomfort in the lower abdomen or (in men) at the base of the penis. ° °Follow these instructions at home: °Medicines °· Take over-the-counter and prescription medicines only as told by your health care provider. °· If you were prescribed an antibiotic medicine, take it as told by your health care provider. Do not stop taking the antibiotic even if you start to feel better. °· Do not drive for 24 hours if you were given a medicine to help you relax (sedative). °· Do not drive or use heavy machinery while taking prescription pain medicine. °Eating and drinking °· Drink enough water and fluids to keep your urine clear or pale yellow. This helps any remaining pieces of the stone to pass. It can also help prevent new stones from forming. °· Eat plenty of fresh fruits and vegetables. °· Follow instructions from your health care provider about eating and drinking restrictions. You may be  instructed: °? To reduce how much salt (sodium) you eat or drink. Check ingredients and nutrition facts on packaged foods and beverages. °? To reduce how much meat you eat. °· Eat the recommended amount of calcium for your age and gender. Ask your health care provider how much calcium you should have. °General instructions °· Get plenty of rest. °· Most people can resume normal activities 1-2 days after the procedure. Ask your health care provider what activities are safe for you. °· If directed, strain all urine through the strainer that was provided by your health care provider. °? Keep all fragments for your health care provider to see. Any stones that are found may be sent to a medical lab for examination. The stone may be as small as a grain of salt. °· Keep all follow-up visits as told by your health care provider. This is important. °Contact a health care provider if: °· You have pain that is severe or does not get better with medicine. °· You have nausea that is severe or does not go away. °· You have blood in your urine longer than your health care provider told you to expect. °· You have more blood in your urine. °· You have pain during urination that does not go away. °· You urinate more frequently than usual and this does not go away. °· You develop a rash or any other possible signs of an allergic reaction. °Get help right away if: °· You have severe pain in   your back, sides, or upper abdomen. °· You have severe pain while urinating. °· Your urine is very dark red. °· You have blood in your stool (feces). °· You cannot pass any urine at all. °· You feel a strong urge to urinate after emptying your bladder. °· You have a fever or chills. °· You develop shortness of breath, difficulty breathing, or chest pain. °· You have severe nausea that leads to persistent vomiting. °· You faint. °Summary °· After this procedure, it is common to have some pain, discomfort, or nausea when pieces (fragments) of the  kidney stone move through the tube that carries urine from the kidney to the bladder (ureter). If this pain or nausea is severe, however, you should contact your health care provider. °· Most people can resume normal activities 1-2 days after the procedure. Ask your health care provider what activities are safe for you. °· Drink enough water and fluids to keep your urine clear or pale yellow. This helps any remaining pieces of the stone to pass, and it can help prevent new stones from forming. °· If directed, strain your urine and keep all fragments for your health care provider to see. Fragments or stones may be as small as a grain of salt. °· Get help right away if you have severe pain in your back, sides, or upper abdomen or have severe pain while urinating. °This information is not intended to replace advice given to you by your health care provider. Make sure you discuss any questions you have with your health care provider. °Document Released: 01/31/2007 Document Revised: 12/03/2015 Document Reviewed: 12/03/2015 °Elsevier Interactive Patient Education © 2018 [employer] ° °

## 2017-08-22 DIAGNOSIS — N201 Calculus of ureter: Secondary | ICD-10-CM | POA: Diagnosis not present

## 2017-09-06 DIAGNOSIS — N201 Calculus of ureter: Secondary | ICD-10-CM | POA: Diagnosis not present

## 2017-09-07 IMAGING — CR DG CHEST 2V
2 series · 2 of 2 positions shown · non-contrast
Comparison: 04/19/2016 chest radiograph.

CLINICAL DATA: 61 y/o M; left-sided chest pain, cough, and
diarrhea.

EXAM:
CHEST  2 VIEW

[chest pa]
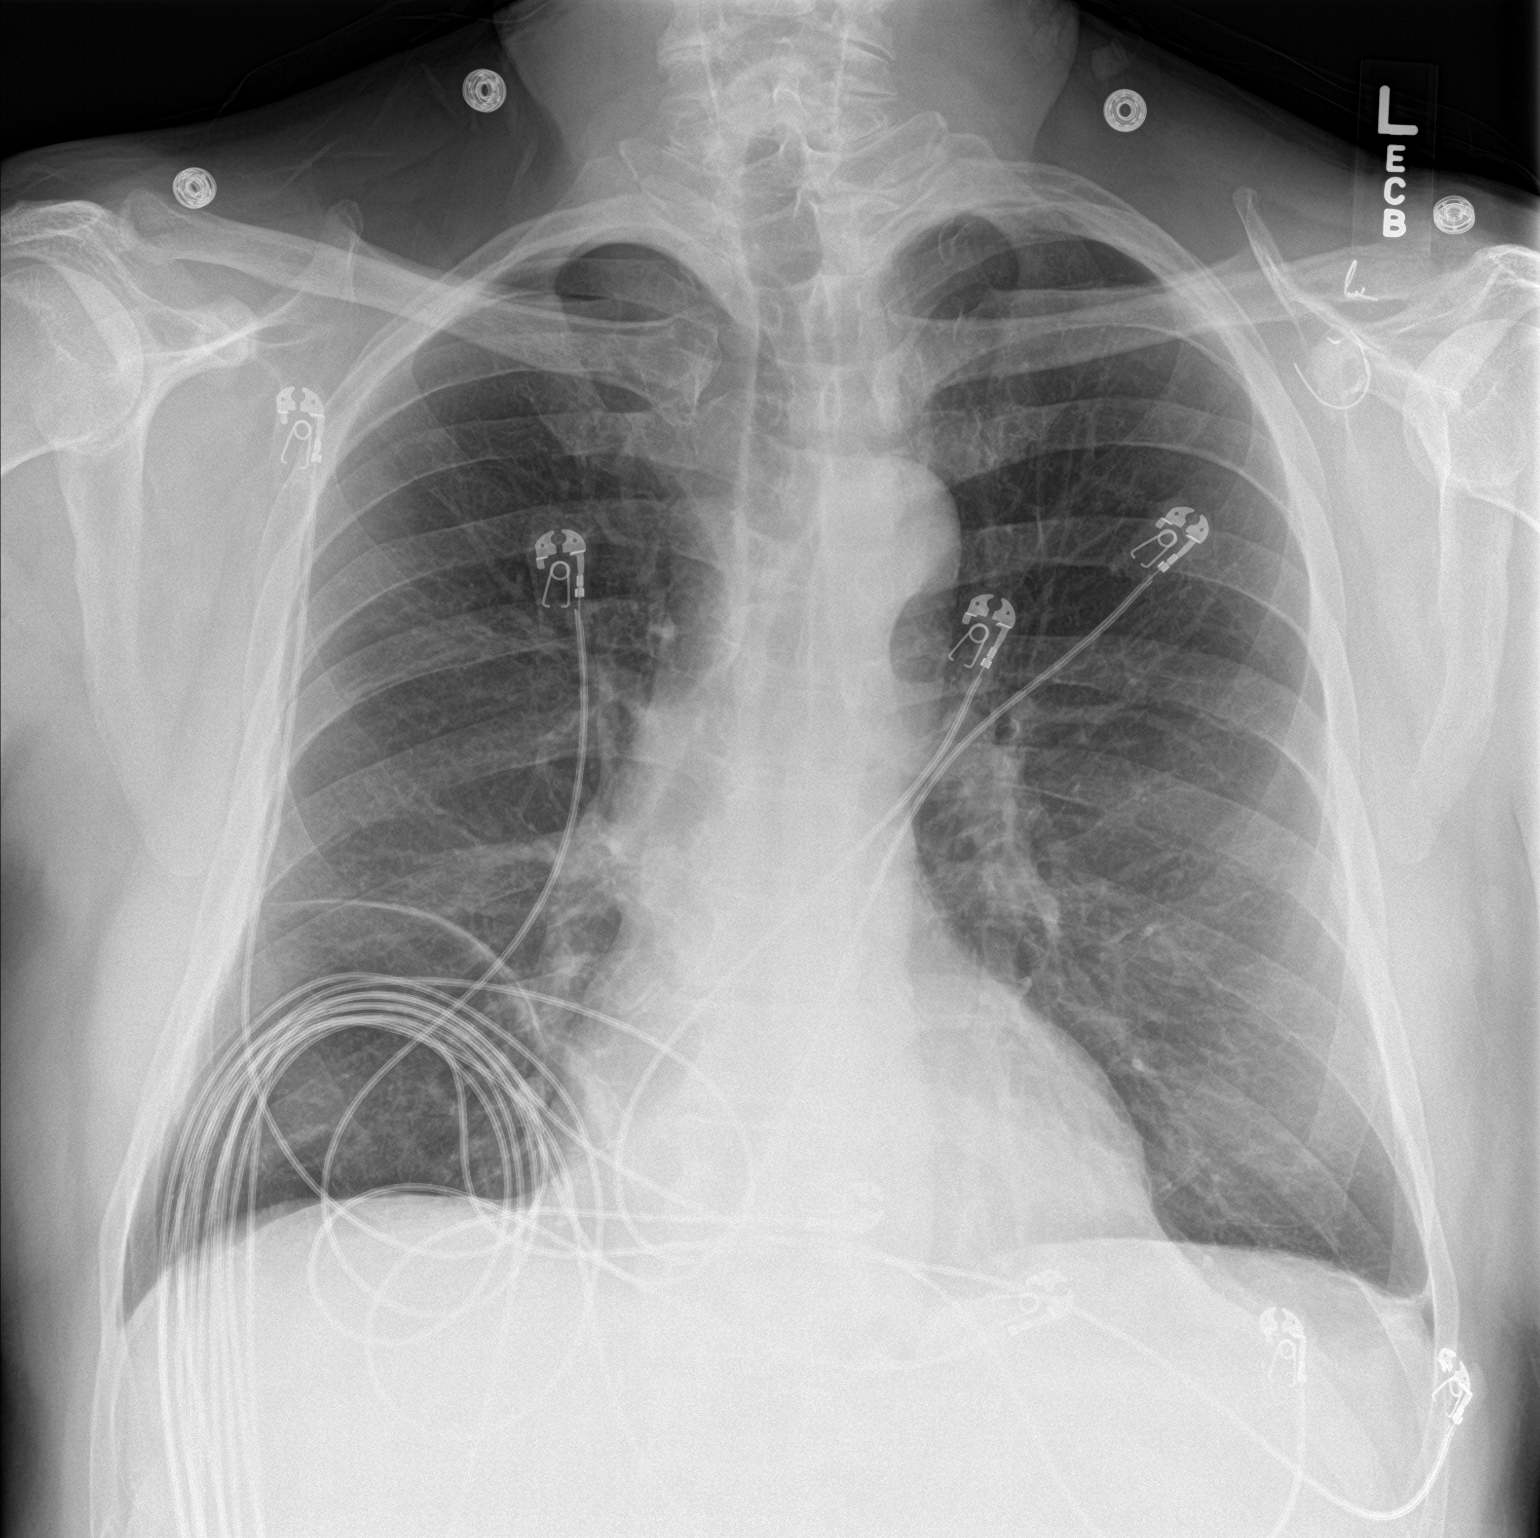

[chest lat]
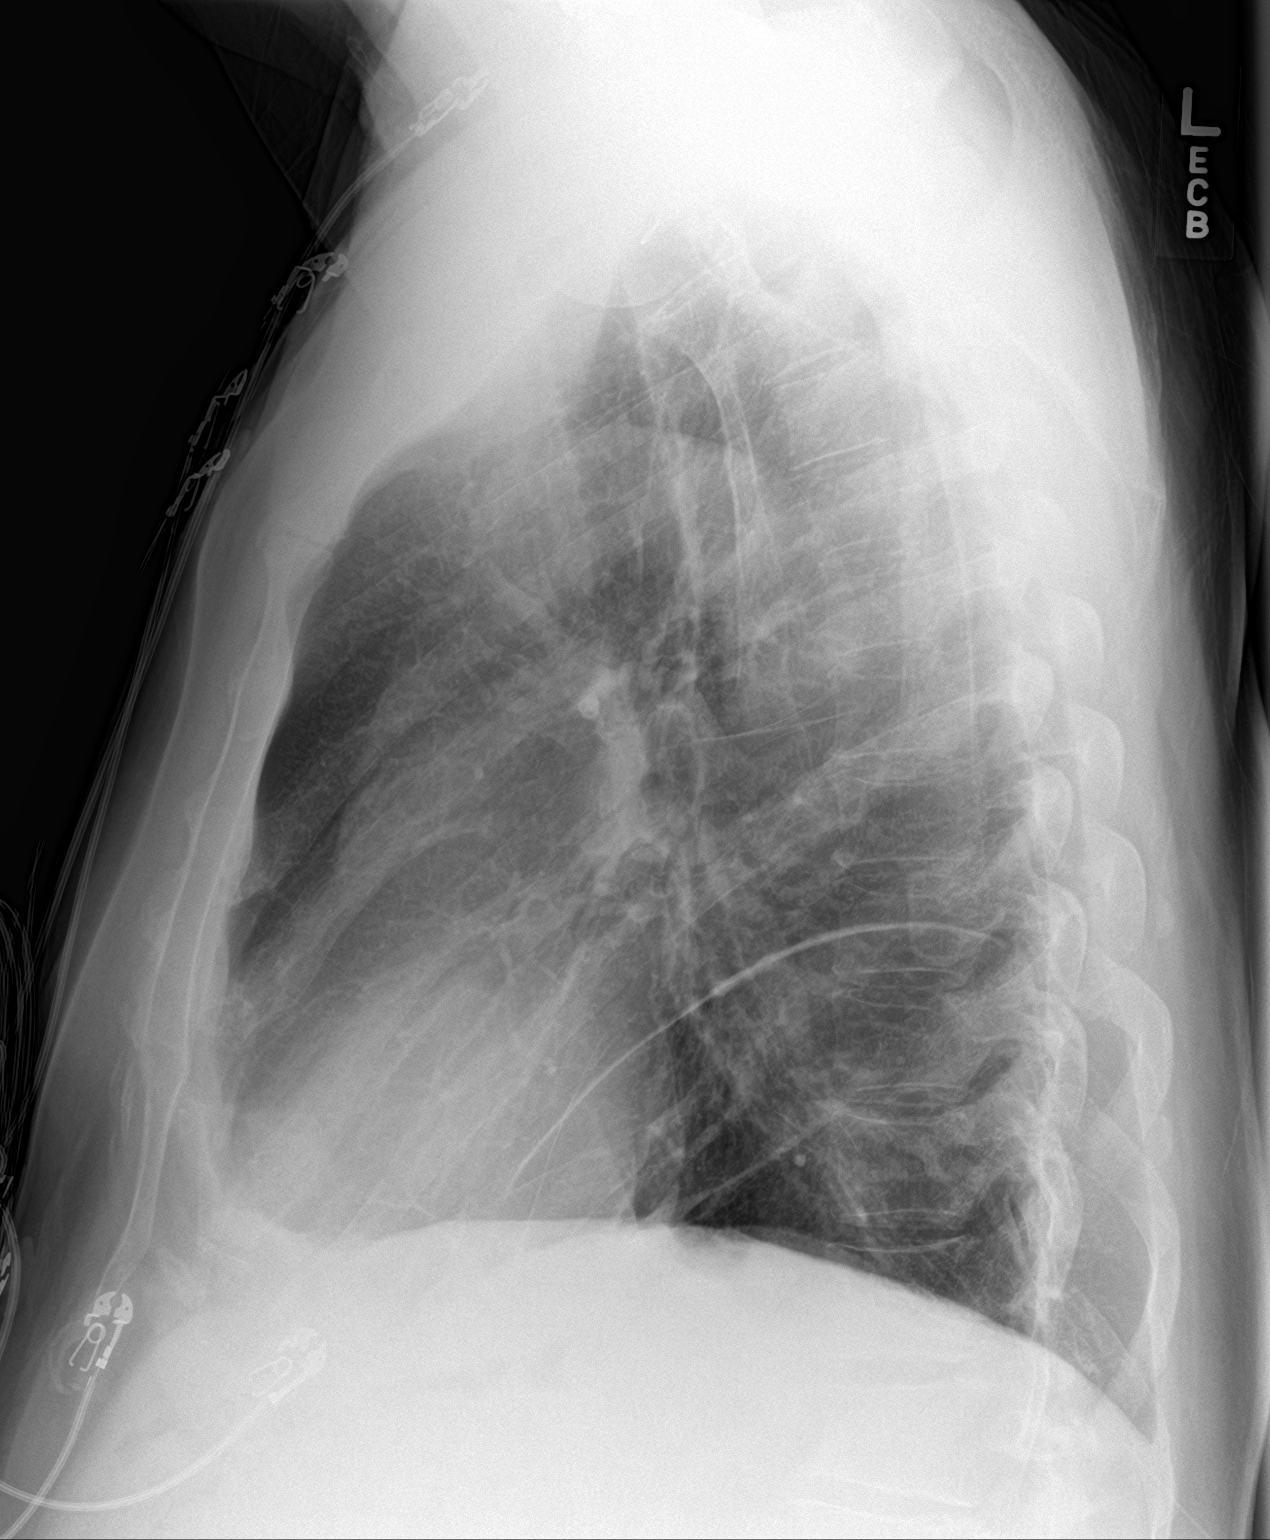

[2 of 2 positions shown; findings below may reference images not displayed]

FINDINGS: Normal cardiac silhouette given projection and technique. Linear
opacity at the left costal diaphragmatic angle at right mid lung
zone compatible with scarring. No consolidation, pneumothorax, or
effusion identified. Stable mild anterior wedge deformity of L1
vertebral body. No acute osseous abnormality is evident.
IMPRESSION: No active cardiopulmonary disease.

By: [place] M.D.

## 2017-09-08 ENCOUNTER — Encounter (HOSPITAL_COMMUNITY): Payer: BLUE CROSS/BLUE SHIELD | Admitting: Internal Medicine

## 2017-09-13 ENCOUNTER — Other Ambulatory Visit (HOSPITAL_COMMUNITY): Payer: Self-pay | Admitting: Internal Medicine

## 2017-09-15 ENCOUNTER — Other Ambulatory Visit (HOSPITAL_COMMUNITY): Payer: Self-pay | Admitting: Pharmacist

## 2017-09-15 ENCOUNTER — Telehealth (HOSPITAL_COMMUNITY): Payer: Self-pay | Admitting: Pharmacist

## 2017-09-15 MED ORDER — POTASSIUM CHLORIDE ER 10 MEQ PO TBCR: 20.0000 meq | EXTENDED_RELEASE_TABLET | Freq: Two times a day (BID) | ORAL | 5 refills | Status: DC

## 2017-09-15 NOTE — Telephone Encounter (Signed)
Mr. Gruenberg called stating that his KCl 20 meq tablets were too big and when cutting them in half, the edges are too sharp and cut his throat. He would like an Rx for the 10 meq tablets which I have sent in for him to see if these are easier for him to swallow.   Ruta Hinds. Velva Harman, PharmD, BCPS, CPP Clinical Pharmacist Phone: [phone] 09/15/2017 11:44 AM

## 2017-09-16 DIAGNOSIS — E782 Mixed hyperlipidemia: Secondary | ICD-10-CM | POA: Diagnosis not present

## 2017-09-16 DIAGNOSIS — I5020 Unspecified systolic (congestive) heart failure: Secondary | ICD-10-CM | POA: Diagnosis not present

## 2017-09-16 DIAGNOSIS — I10 Essential (primary) hypertension: Secondary | ICD-10-CM | POA: Diagnosis not present

## 2017-09-16 DIAGNOSIS — Z0000 Encounter for general adult medical examination without abnormal findings: Secondary | ICD-10-CM | POA: Diagnosis not present

## 2017-09-22 ENCOUNTER — Ambulatory Visit (HOSPITAL_COMMUNITY)
Admission: RE | Admit: 2017-09-22 | Discharge: 2017-09-22 | Disposition: A | Discharge status: Home / Self Care | Payer: BLUE CROSS/BLUE SHIELD | Source: Ambulatory Visit | Attending: Internal Medicine | Admitting: Internal Medicine

## 2017-09-22 ENCOUNTER — Encounter (HOSPITAL_COMMUNITY): Payer: Self-pay | Admitting: Internal Medicine

## 2017-09-22 VITALS — BP 120/70 | HR 55 | Wt 223.8 lb

## 2017-09-22 DIAGNOSIS — E782 Mixed hyperlipidemia: Secondary | ICD-10-CM | POA: Insufficient documentation

## 2017-09-22 DIAGNOSIS — G4733 Obstructive sleep apnea (adult) (pediatric): Secondary | ICD-10-CM | POA: Diagnosis not present

## 2017-09-22 DIAGNOSIS — J45909 Unspecified asthma, uncomplicated: Secondary | ICD-10-CM | POA: Insufficient documentation

## 2017-09-22 DIAGNOSIS — N281 Cyst of kidney, acquired: Secondary | ICD-10-CM | POA: Insufficient documentation

## 2017-09-22 DIAGNOSIS — K219 Gastro-esophageal reflux disease without esophagitis: Secondary | ICD-10-CM | POA: Insufficient documentation

## 2017-09-22 DIAGNOSIS — R001 Bradycardia, unspecified: Secondary | ICD-10-CM | POA: Diagnosis not present

## 2017-09-22 DIAGNOSIS — I5022 Chronic systolic (congestive) heart failure: Secondary | ICD-10-CM | POA: Diagnosis not present

## 2017-09-22 DIAGNOSIS — N183 Chronic kidney disease, stage 3 unspecified: Secondary | ICD-10-CM

## 2017-09-22 DIAGNOSIS — I480 Paroxysmal atrial fibrillation: Secondary | ICD-10-CM | POA: Diagnosis not present

## 2017-09-22 DIAGNOSIS — I130 Hypertensive heart and chronic kidney disease with heart failure and stage 1 through stage 4 chronic kidney disease, or unspecified chronic kidney disease: Secondary | ICD-10-CM | POA: Insufficient documentation

## 2017-09-22 DIAGNOSIS — N184 Chronic kidney disease, stage 4 (severe): Secondary | ICD-10-CM | POA: Insufficient documentation

## 2017-09-22 DIAGNOSIS — Z7901 Long term (current) use of anticoagulants: Secondary | ICD-10-CM | POA: Insufficient documentation

## 2017-09-22 DIAGNOSIS — Z888 Allergy status to other drugs, medicaments and biological substances status: Secondary | ICD-10-CM | POA: Diagnosis not present

## 2017-09-22 DIAGNOSIS — Z87442 Personal history of urinary calculi: Secondary | ICD-10-CM | POA: Diagnosis not present

## 2017-09-22 DIAGNOSIS — Z79899 Other long term (current) drug therapy: Secondary | ICD-10-CM | POA: Diagnosis not present

## 2017-09-22 DIAGNOSIS — I429 Cardiomyopathy, unspecified: Secondary | ICD-10-CM | POA: Insufficient documentation

## 2017-09-22 LAB — TSH: TSH: 0.664 u[IU]/mL (ref 0.350–4.500)

## 2017-09-22 LAB — COMPREHENSIVE METABOLIC PANEL
ALT: 18 U/L (ref 0–44)
ANION GAP: 8 (ref 5–15)
AST: 20 U/L (ref 15–41)
Albumin: 3.7 g/dL (ref 3.5–5.0)
Alkaline Phosphatase: 54 U/L (ref 38–126)
BUN: 19 mg/dL (ref 8–23)
CALCIUM: 8.7 mg/dL — AB (ref 8.9–10.3)
CHLORIDE: 110 mmol/L (ref 98–111)
CO2: 23 mmol/L (ref 22–32)
Creatinine, Ser: 1.89 mg/dL — ABNORMAL HIGH (ref 0.61–1.24)
GFR calc non Af Amer: 36 mL/min — ABNORMAL LOW (ref >60)
GFR, EST AFRICAN AMERICAN: 42 mL/min — AB (ref >60)
Glucose, Bld: 89 mg/dL (ref 70–99)
Potassium: 3.5 mmol/L (ref 3.5–5.1)
Sodium: 141 mmol/L (ref 135–145)
Total Bilirubin: 1.5 mg/dL — ABNORMAL HIGH (ref 0.3–1.2)
Total Protein: 6.4 g/dL — ABNORMAL LOW (ref 6.5–8.1)

## 2017-09-22 LAB — T4, FREE: FREE T4: 1.31 ng/dL (ref 0.82–1.77)

## 2017-09-22 NOTE — Patient Instructions (Signed)
Labs today  We will contact you in 6 months to schedule your next appointment and echocardiogram

## 2017-09-22 NOTE — Progress Notes (Signed)
Advanced Heart Failure Clinic Note    Primary Cardiologist: Dr. Johnsie Cancel  HPI: [name] is a 62 year old male with a past medical history of tachy mediated cardiomyopathy, atrial fibrillation (on Eliquis), systolic CHF (EF 98-92%).   He was first diagnosed with Afib in 02/2016, seen in the Afib clinic and the ED for this. He underwent 2 DC-CV and only held NSR for a few days. He started Amiodarone.   Admitted 04/16/16-04/27/16 with rapid afib and decompensation. He was started on milrinone for marginal mixed venous sat of 57% and optimization with plans for repeat DCCV after loading with IV Amiodarone. Underwent TEE/DCCV on 04/23/16 with restoration of NSR.He was continued on Eliquis for anticoagulation at discharge, as he did have some evidence of LV thrombus on Echo, however it appeared to have resolved when he had a TEE on 04/23/16. Overall diuresed 9.5L and discharge weight was 217 pounds.   Echo 3/18 EF 40-45% RV mildly HK   Echo 2/19 EF 40-45% RV mildly reduced  He presents today for regular follow up. Doing well. Remains active. No CP or SOB. No edema, orthopnea or PND. No palpitations. No bleeding on Eliquis.    Review of systems complete and found to be negative unless listed in HPI.   Past Medical History:  Diagnosis Date  . A-fib ([place])   . Asthma, persistent   . BPH (benign prostatic hyperplasia) 2014  . CHF (congestive heart failure) ([place])   . Combined hyperlipidemia   . GERD (gastroesophageal reflux disease)   . History of kidney stones   . Hypertension   . 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 Obesity   . Renal stone 04/2012  . Seasonal allergic rhinitis   . Sleep apnea    does not use a cpap machine    Current Outpatient Medications  Medication Sig Dispense Refill  . acetaminophen (TYLENOL) 325 MG tablet Take 2 tablets (650 mg total) by mouth every 6 (six) hours as needed for mild pain or headache.    . albuterol (PROVENTIL HFA;VENTOLIN HFA) 108 (90 Base)  MCG/ACT inhaler Inhale 2 puffs into the lungs every 6 (six) hours as needed for wheezing or shortness of breath.    Marland Kitchen amiodarone (PACERONE) 200 MG tablet TAKE 1 TABLET BY MOUTH EVERY DAY 90 tablet 1  . apixaban (ELIQUIS) 5 MG TABS tablet Take 1 tablet (5 mg total) by mouth 2 (two) times daily. 180 tablet 3  . fluticasone furoate-vilanterol (BREO ELLIPTA) 100-25 MCG/INH AEPB Inhale 1 puff into the lungs daily as needed (shortness of breath).     . furosemide (LASIX) 40 MG tablet TAKE 1 TABLET BY MOUTH EVERY DAY 30 tablet 2  . hydrALAZINE (APRESOLINE) 50 MG tablet Take 1 tablet (50 mg total) by mouth 3 (three) times daily. 90 tablet 6  . morphine (MSIR) 30 MG tablet Take 1 tablet (30 mg total) by mouth every 4 (four) hours as needed for severe pain. 30 tablet 0  . ondansetron (ZOFRAN) 4 MG tablet Take 1 tablet (4 mg total) by mouth every 8 (eight) hours as needed for nausea or vomiting. 12 tablet 0  . oxyCODONE-acetaminophen (PERCOCET/ROXICET) 5-325 MG tablet Take 1-2 tablets by mouth every 6 (six) hours as needed for severe pain. 15 tablet 0  . pantoprazole (PROTONIX) 40 MG tablet Take 40 mg by mouth daily.    . potassium chloride (K-DUR) 10 MEQ tablet Take 2 tablets (20 mEq total) by mouth 2 (two) times daily. 120 tablet 5  .  promethazine (PHENERGAN) 25 MG tablet Take 1 tablet (25 mg total) by mouth every 6 (six) hours as needed for nausea or vomiting. 12 tablet 0  . sacubitril-valsartan (ENTRESTO) 97-103 MG Take 1 tablet by mouth 2 (two) times daily. 180 tablet 3  . tamsulosin (FLOMAX) 0.4 MG CAPS capsule Take 0.8 mg by mouth at bedtime.    Marland Kitchen oxybutynin (DITROPAN) 5 MG tablet Take 1 tablet (5 mg total) by mouth every 8 (eight) hours as needed for bladder spasms. (Patient not taking: Reported on 09/22/2017) 30 tablet 1   No current facility-administered medications for this encounter.     Allergies  Allergen Reactions  . Ibuprofen Other (See Comments)    Has a-Fib, unable to take  . Isosorbide  Other (See Comments)    HEADACHES from Imdur  . Lisinopril Cough  . Digoxin And Related Rash      Social History   Socioeconomic History  . Marital status: Married    Spouse name: Not on file  . Number of children: Not on file  . Years of education: Not on file  . Highest education level: Not on file  Occupational History  . Occupation: GREENHOUSE MANAGEMENT  Social Needs  . Financial resource strain: Not on file  . Food insecurity:    Worry: Not on file    Inability: Not on file  . Transportation needs:    Medical: Not on file    Non-medical: Not on file  Tobacco Use  . Smoking status: Never Smoker  . Smokeless tobacco: Never Used  Substance and Sexual Activity  . Alcohol use: No  . Drug use: No  . Sexual activity: Not on file  Lifestyle  . Physical activity:    Days per week: Not on file    Minutes per session: Not on file  . Stress: Not on file  Relationships  . Social connections:    Talks on phone: Not on file    Gets together: Not on file    Attends religious service: Not on file    Active member of club or organization: Not on file    Attends meetings of clubs or organizations: Not on file    Relationship status: Not on file  . Intimate partner violence:    Fear of current or ex partner: Not on file    Emotionally abused: Not on file    Physically abused: Not on file    Forced sexual activity: Not on file  Other Topics Concern  . Not on file  Social History Narrative  . Not on file      Family History  Problem Relation Age of Onset  . Hypertension Mother     Vitals:   09/22/17 1502  BP: 120/70  Pulse: (!) 55  SpO2: 97%  Weight: 101.5 kg (223 lb 12.8 oz)   Wt Readings from Last 3 Encounters:  09/22/17 101.5 kg (223 lb 12.8 oz)  08/08/17 104.3 kg (230 lb)  07/29/17 102.6 kg (226 lb 2 oz)     PHYSICAL EXAM: General:  Well appearing. No resp difficulty HEENT: normal Neck: supple. no JVD. Carotids 2+ bilat; no bruits. No lymphadenopathy  or thryomegaly appreciated. Cor: PMI nondisplaced. Loletha Grayer regular . No rubs, gallops or murmurs. Lungs: clear Abdomen: soft, nontender, nondistended. No hepatosplenomegaly. No bruits or masses. Good bowel sounds. Extremities: no cyanosis, clubbing, rash, edema Neuro: alert & orientedx3, cranial nerves grossly intact. moves all 4 extremities w/o difficulty. Affect pleasant   EKG: Sinus brady 53  Personally reviewed  ASSESSMENT & PLAN: 1. Chronic systolic CHF: EF 73-66%.  NICM, felt to be tachy mediated. ? AF. - Echo 06/2016 LVEF 40%. - Echo 02/2017: EF 40-45% - Volume status looks good.  - Continue lasix 40 mg every day. BMET today.  - Continue Entresto to 97/103 BID. - Intolerant to Imdur, caused headaches.  - Continue hydralazine 50mg  TID.    - No BB with bradycardia. - We had previously discussed proceeding with coronary angio to exclude CAD but as he is doing well and no angina and CKD will hold off. If develops symptoms or EF worse can reconsider.  - Consider cMRI if creatinine acceptable. Recehck today 2. Paroxsymal atrial fibrillation - Remains in NSR today.  - Continue amiodarone 200 mg daily.  - Continue Eliquis. Denies bleeding.  LFTs normal 06/2017. Need to check TFTs.  3. Hypertension - Stable. 4. History of LV thrombus - Resolved. No change.  5. Chronic kidney disease stage III-IV:  - baseline creatinine 1.8-2.0.  - Most recent creatinine 2.75. Had a kidney stone at the time. Check BMET today.  - Had renal cysts and hydro on CT when he had kidney stone - Will refer to [facility] for further eval.  6. OSA - Sleep study 4/18 with mild OSA. AHI 13.8/hr. Following with Dr. Radford Pax. No change.  7. Rash - Resolved with change in Amiodarone manufacturers. His home pharmacy is aware of the correct suspension for him.    Glori Bickers, MD  3:16 PM  09/22/17

## 2017-09-22 NOTE — Addendum Note (Signed)
Encounter addended by: Scarlette Calico, RN on: 09/22/2017 3:25 PM  Actions taken: Sign clinical note, Order list changed, Diagnosis association updated

## 2017-09-23 LAB — T3, FREE: T3, Free: 2.4 pg/mL (ref 2.0–4.4)

## 2017-10-14 ENCOUNTER — Encounter (HOSPITAL_COMMUNITY): Payer: Self-pay | Admitting: *Deleted

## 2017-10-14 NOTE — Progress Notes (Signed)
Received fax from [facility], pt has been scheduled to see Dr Deterding on 10/24/17 at 3:45.

## 2017-10-19 ENCOUNTER — Telehealth (HOSPITAL_COMMUNITY): Payer: Self-pay | Admitting: Pharmacist

## 2017-10-19 MED ORDER — SACUBITRIL-VALSARTAN 97-103 MG PO TABS: 1.0000 | ORAL_TABLET | Freq: Two times a day (BID) | ORAL | 3 refills | Status: DC

## 2017-10-19 NOTE — Telephone Encounter (Signed)
Mr. Schaum called stating that he is no longer able to get refills of his Delene Loll through the [employer] patient assistance foundation. This is because it expired in June and he now has [employer] so he would likely not be eligible for assistance anymore but he can use the company's $10 copay card. I left VM with this information for Mr. Belter.   Ruta Hinds. Velva Harman, PharmD, BCPS, CPP Clinical Pharmacist Phone: [phone] 10/19/2017 1:57 PM

## 2017-10-24 DIAGNOSIS — N183 Chronic kidney disease, stage 3 (moderate): Secondary | ICD-10-CM | POA: Diagnosis not present

## 2017-10-24 DIAGNOSIS — N390 Urinary tract infection, site not specified: Secondary | ICD-10-CM | POA: Diagnosis not present

## 2017-10-24 DIAGNOSIS — D631 Anemia in chronic kidney disease: Secondary | ICD-10-CM | POA: Diagnosis not present

## 2017-10-26 ENCOUNTER — Other Ambulatory Visit: Payer: Self-pay | Admitting: Nephrology

## 2017-10-26 DIAGNOSIS — I129 Hypertensive chronic kidney disease with stage 1 through stage 4 chronic kidney disease, or unspecified chronic kidney disease: Secondary | ICD-10-CM

## 2017-10-26 DIAGNOSIS — N183 Chronic kidney disease, stage 3 unspecified: Secondary | ICD-10-CM

## 2017-10-28 DIAGNOSIS — N183 Chronic kidney disease, stage 3 (moderate): Secondary | ICD-10-CM | POA: Diagnosis not present

## 2017-11-08 ENCOUNTER — Ambulatory Visit
Admission: RE | Admit: 2017-11-08 | Discharge: 2017-11-08 | Disposition: A | Discharge status: Home / Self Care | Payer: BLUE CROSS/BLUE SHIELD | Source: Ambulatory Visit | Attending: Nephrology | Admitting: Nephrology

## 2017-11-08 DIAGNOSIS — I129 Hypertensive chronic kidney disease with stage 1 through stage 4 chronic kidney disease, or unspecified chronic kidney disease: Secondary | ICD-10-CM

## 2017-11-08 DIAGNOSIS — N183 Chronic kidney disease, stage 3 unspecified: Secondary | ICD-10-CM

## 2017-11-14 ENCOUNTER — Encounter (HOSPITAL_COMMUNITY): Payer: Self-pay | Admitting: Urology

## 2017-11-21 DIAGNOSIS — D631 Anemia in chronic kidney disease: Secondary | ICD-10-CM | POA: Diagnosis not present

## 2017-11-21 DIAGNOSIS — I129 Hypertensive chronic kidney disease with stage 1 through stage 4 chronic kidney disease, or unspecified chronic kidney disease: Secondary | ICD-10-CM | POA: Diagnosis not present

## 2017-11-21 DIAGNOSIS — N183 Chronic kidney disease, stage 3 (moderate): Secondary | ICD-10-CM | POA: Diagnosis not present

## 2017-11-21 DIAGNOSIS — N2581 Secondary hyperparathyroidism of renal origin: Secondary | ICD-10-CM | POA: Diagnosis not present

## 2017-11-28 ENCOUNTER — Other Ambulatory Visit (HOSPITAL_COMMUNITY): Payer: Self-pay | Admitting: Pharmacist

## 2017-11-28 MED ORDER — APIXABAN 5 MG PO TABS: 5.0000 mg | ORAL_TABLET | Freq: Two times a day (BID) | ORAL | 3 refills | Status: DC

## 2017-11-30 ENCOUNTER — Other Ambulatory Visit: Payer: Self-pay | Admitting: Internal Medicine

## 2017-12-20 ENCOUNTER — Other Ambulatory Visit (HOSPITAL_COMMUNITY): Payer: Self-pay | Admitting: Internal Medicine

## 2017-12-27 ENCOUNTER — Other Ambulatory Visit (HOSPITAL_COMMUNITY): Payer: Self-pay | Admitting: Internal Medicine

## 2017-12-30 DIAGNOSIS — Z01818 Encounter for other preprocedural examination: Secondary | ICD-10-CM | POA: Diagnosis not present

## 2018-01-05 DIAGNOSIS — Z86010 Personal history of colonic polyps: Secondary | ICD-10-CM | POA: Diagnosis not present

## 2018-01-05 DIAGNOSIS — K648 Other hemorrhoids: Secondary | ICD-10-CM | POA: Diagnosis not present

## 2018-01-05 DIAGNOSIS — K5730 Diverticulosis of large intestine without perforation or abscess without bleeding: Secondary | ICD-10-CM | POA: Diagnosis not present

## 2018-01-06 DIAGNOSIS — G4733 Obstructive sleep apnea (adult) (pediatric): Secondary | ICD-10-CM | POA: Diagnosis not present

## 2018-01-20 ENCOUNTER — Other Ambulatory Visit (HOSPITAL_COMMUNITY): Payer: Self-pay | Admitting: Internal Medicine

## 2018-02-18 DIAGNOSIS — J069 Acute upper respiratory infection, unspecified: Secondary | ICD-10-CM | POA: Diagnosis not present

## 2018-02-20 DIAGNOSIS — J45909 Unspecified asthma, uncomplicated: Secondary | ICD-10-CM | POA: Diagnosis not present

## 2018-03-20 DIAGNOSIS — D631 Anemia in chronic kidney disease: Secondary | ICD-10-CM | POA: Diagnosis not present

## 2018-03-20 DIAGNOSIS — I129 Hypertensive chronic kidney disease with stage 1 through stage 4 chronic kidney disease, or unspecified chronic kidney disease: Secondary | ICD-10-CM | POA: Diagnosis not present

## 2018-03-20 DIAGNOSIS — N183 Chronic kidney disease, stage 3 (moderate): Secondary | ICD-10-CM | POA: Diagnosis not present

## 2018-03-20 DIAGNOSIS — N2581 Secondary hyperparathyroidism of renal origin: Secondary | ICD-10-CM | POA: Diagnosis not present

## 2018-05-08 DIAGNOSIS — N401 Enlarged prostate with lower urinary tract symptoms: Secondary | ICD-10-CM | POA: Diagnosis not present

## 2018-05-08 DIAGNOSIS — R972 Elevated prostate specific antigen [PSA]: Secondary | ICD-10-CM | POA: Diagnosis not present

## 2018-05-08 DIAGNOSIS — R351 Nocturia: Secondary | ICD-10-CM | POA: Diagnosis not present

## 2018-06-05 ENCOUNTER — Other Ambulatory Visit (HOSPITAL_COMMUNITY): Payer: BLUE CROSS/BLUE SHIELD

## 2018-06-05 ENCOUNTER — Encounter (HOSPITAL_COMMUNITY): Payer: BLUE CROSS/BLUE SHIELD | Admitting: Internal Medicine

## 2018-06-27 DIAGNOSIS — D631 Anemia in chronic kidney disease: Secondary | ICD-10-CM | POA: Diagnosis not present

## 2018-06-27 DIAGNOSIS — N183 Chronic kidney disease, stage 3 (moderate): Secondary | ICD-10-CM | POA: Diagnosis not present

## 2018-06-27 DIAGNOSIS — N2581 Secondary hyperparathyroidism of renal origin: Secondary | ICD-10-CM | POA: Diagnosis not present

## 2018-06-27 DIAGNOSIS — I129 Hypertensive chronic kidney disease with stage 1 through stage 4 chronic kidney disease, or unspecified chronic kidney disease: Secondary | ICD-10-CM | POA: Diagnosis not present

## 2018-06-27 DIAGNOSIS — N189 Chronic kidney disease, unspecified: Secondary | ICD-10-CM | POA: Diagnosis not present

## 2018-07-31 DIAGNOSIS — N210 Calculus in bladder: Secondary | ICD-10-CM | POA: Diagnosis not present

## 2018-07-31 DIAGNOSIS — R310 Gross hematuria: Secondary | ICD-10-CM | POA: Diagnosis not present

## 2018-08-01 ENCOUNTER — Ambulatory Visit (HOSPITAL_COMMUNITY)
Admission: RE | Admit: 2018-08-01 | Discharge: 2018-08-01 | Disposition: A | Discharge status: Home / Self Care | Payer: BC Managed Care – PPO | Source: Ambulatory Visit | Attending: Internal Medicine | Admitting: Internal Medicine

## 2018-08-01 ENCOUNTER — Other Ambulatory Visit: Payer: Self-pay

## 2018-08-01 ENCOUNTER — Ambulatory Visit (HOSPITAL_BASED_OUTPATIENT_CLINIC_OR_DEPARTMENT_OTHER)
Admission: RE | Admit: 2018-08-01 | Discharge: 2018-08-01 | Disposition: A | Discharge status: Home / Self Care | Payer: BC Managed Care – PPO | Source: Ambulatory Visit | Attending: Internal Medicine | Admitting: Internal Medicine

## 2018-08-01 VITALS — BP 126/80 | HR 45 | Wt 228.0 lb

## 2018-08-01 DIAGNOSIS — Z79899 Other long term (current) drug therapy: Secondary | ICD-10-CM | POA: Diagnosis not present

## 2018-08-01 DIAGNOSIS — Z87442 Personal history of urinary calculi: Secondary | ICD-10-CM | POA: Diagnosis not present

## 2018-08-01 DIAGNOSIS — N183 Chronic kidney disease, stage 3 unspecified: Secondary | ICD-10-CM

## 2018-08-01 DIAGNOSIS — N400 Enlarged prostate without lower urinary tract symptoms: Secondary | ICD-10-CM | POA: Diagnosis not present

## 2018-08-01 DIAGNOSIS — Z886 Allergy status to analgesic agent status: Secondary | ICD-10-CM | POA: Diagnosis not present

## 2018-08-01 DIAGNOSIS — Z8249 Family history of ischemic heart disease and other diseases of the circulatory system: Secondary | ICD-10-CM | POA: Diagnosis not present

## 2018-08-01 DIAGNOSIS — Z86718 Personal history of other venous thrombosis and embolism: Secondary | ICD-10-CM | POA: Insufficient documentation

## 2018-08-01 DIAGNOSIS — E782 Mixed hyperlipidemia: Secondary | ICD-10-CM | POA: Diagnosis not present

## 2018-08-01 DIAGNOSIS — K219 Gastro-esophageal reflux disease without esophagitis: Secondary | ICD-10-CM | POA: Insufficient documentation

## 2018-08-01 DIAGNOSIS — G4733 Obstructive sleep apnea (adult) (pediatric): Secondary | ICD-10-CM | POA: Diagnosis not present

## 2018-08-01 DIAGNOSIS — I5022 Chronic systolic (congestive) heart failure: Secondary | ICD-10-CM | POA: Diagnosis not present

## 2018-08-01 DIAGNOSIS — I5021 Acute systolic (congestive) heart failure: Secondary | ICD-10-CM | POA: Diagnosis not present

## 2018-08-01 DIAGNOSIS — I10 Essential (primary) hypertension: Secondary | ICD-10-CM

## 2018-08-01 DIAGNOSIS — Z888 Allergy status to other drugs, medicaments and biological substances status: Secondary | ICD-10-CM | POA: Insufficient documentation

## 2018-08-01 DIAGNOSIS — I480 Paroxysmal atrial fibrillation: Secondary | ICD-10-CM

## 2018-08-01 DIAGNOSIS — I428 Other cardiomyopathies: Secondary | ICD-10-CM | POA: Insufficient documentation

## 2018-08-01 DIAGNOSIS — I130 Hypertensive heart and chronic kidney disease with heart failure and stage 1 through stage 4 chronic kidney disease, or unspecified chronic kidney disease: Secondary | ICD-10-CM | POA: Diagnosis not present

## 2018-08-01 DIAGNOSIS — J45909 Unspecified asthma, uncomplicated: Secondary | ICD-10-CM | POA: Insufficient documentation

## 2018-08-01 DIAGNOSIS — Z7901 Long term (current) use of anticoagulants: Secondary | ICD-10-CM | POA: Insufficient documentation

## 2018-08-01 LAB — COMPREHENSIVE METABOLIC PANEL
ALT: 19 U/L (ref 0–44)
AST: 20 U/L (ref 15–41)
Albumin: 3.4 g/dL — ABNORMAL LOW (ref 3.5–5.0)
Alkaline Phosphatase: 66 U/L (ref 38–126)
Anion gap: 9 (ref 5–15)
BUN: 22 mg/dL (ref 8–23)
CO2: 21 mmol/L — ABNORMAL LOW (ref 22–32)
Calcium: 8.7 mg/dL — ABNORMAL LOW (ref 8.9–10.3)
Chloride: 109 mmol/L (ref 98–111)
Creatinine, Ser: 2.06 mg/dL — ABNORMAL HIGH (ref 0.61–1.24)
GFR calc Af Amer: 39 mL/min — ABNORMAL LOW (ref >60)
GFR calc non Af Amer: 33 mL/min — ABNORMAL LOW (ref >60)
Glucose, Bld: 78 mg/dL (ref 70–99)
Potassium: 3.2 mmol/L — ABNORMAL LOW (ref 3.5–5.1)
Sodium: 139 mmol/L (ref 135–145)
Total Bilirubin: 1.1 mg/dL (ref 0.3–1.2)
Total Protein: 6.3 g/dL — ABNORMAL LOW (ref 6.5–8.1)

## 2018-08-01 LAB — CBC
HCT: 39.1 % (ref 39.0–52.0)
Hemoglobin: 12.9 g/dL — ABNORMAL LOW (ref 13.0–17.0)
MCH: 30.2 pg (ref 26.0–34.0)
MCHC: 33 g/dL (ref 30.0–36.0)
MCV: 91.6 fL (ref 80.0–100.0)
Platelets: 227 10*3/uL (ref 150–400)
RBC: 4.27 MIL/uL (ref 4.22–5.81)
RDW: 13.5 % (ref 11.5–15.5)
WBC: 5.5 10*3/uL (ref 4.0–10.5)
nRBC: 0 % (ref 0.0–0.2)

## 2018-08-01 MED ORDER — AMIODARONE HCL 100 MG PO TABS: 100.0000 mg | ORAL_TABLET | Freq: Every day | ORAL | 3 refills | Status: DC

## 2018-08-01 MED ORDER — HYDRALAZINE HCL 25 MG PO TABS: 25.0000 mg | ORAL_TABLET | Freq: Three times a day (TID) | ORAL | 3 refills | Status: DC

## 2018-08-01 NOTE — Patient Instructions (Signed)
Labs were done today. We will call you with any ABNORMAL results. No news is good news!  EKG was completed.  CHANGE to taking Hydralazine 25 mg (1 tab) three times a day.  DECREASE Amiodarone to 100 mg (1 tab) each day.  Your physician wants you to follow-up in: 1 year. You will receive a reminder letter in the mail two months in advance. If you don't receive a letter, please call our office to schedule the follow-up appointment.   At the [facility], you and your health needs are our priority. As part of our continuing mission to provide you with exceptional heart care, we have created designated Provider Care Teams. These Care Teams include your primary Cardiologist (physician) and Advanced Practice Providers (APPs- Physician Assistants and Nurse Practitioners) who all work together to provide you with the care you need, when you need it.   You may see any of the following providers on your designated Care Team at your next follow up: Marland Kitchen Dr Glori Bickers . Dr Loralie Champagne . Darrick Grinder, NP

## 2018-08-01 NOTE — Progress Notes (Signed)
Echocardiogram 2D Echocardiogram has been performed.  Oneal Deputy Shanece Cochrane 08/01/2018, 1:42 PM

## 2018-08-01 NOTE — Progress Notes (Signed)
Advanced Heart Failure Clinic Note    Primary Cardiologist: Dr. Johnsie Cancel HF: Dr Haroldine Laws  PCP: Dr Laurann [place] Urology:  Dr Diona Fanti   HPI: Mr. Rehman is a 63 year old male with a past medical history of tachy mediated cardiomyopathy, atrial fibrillation (on Eliquis), systolic CHF (EF 44-92%).   He was first diagnosed with Afib in 02/2016, seen in the Afib clinic and the ED for this. He underwent 2 DC-CV and only held NSR for a few days. He started Amiodarone.   Admitted 04/16/16-04/27/16 with rapid afib and decompensation. He was started on milrinone for marginal mixed venous sat of 57% and optimization with plans for repeat DCCV after loading with IV Amiodarone. Underwent TEE/DCCV on 04/23/16 with restoration of NSR.He was continued on Eliquis for anticoagulation at discharge, as he did have some evidence of LV thrombus on Echo, however it appeared to have resolved when he had a TEE on 04/23/16. Overall diuresed 9.5L and discharge weight was 217 pounds.   July 2019 he had kidney stones and underwent lithotripsy.   Today he presents for HF follow up. Last months he saw nephrology and hydralazine was cut back to 25 mg three times a day. SBP at home < 120.  Overall feeling fine. Denies SOB/PND/Orthopnea. Appetite ok. No fever or chills. Weight at home 225 pounds. Taking all medications. Working full time in [place].   Echo 3/18 EF 40-45% RV mildly HK  Echo 2/19 EF 40-45% RV mildly reduced ECHO 08/01/2018 EF 50-55%   Review of systems complete and found to be negative unless listed in HPI.   Past Medical History:  Diagnosis Date  . A-fib ([place])   . Asthma, persistent   . BPH (benign prostatic hyperplasia) 2014  . CHF (congestive heart failure) ([place])   . Combined hyperlipidemia   . GERD (gastroesophageal reflux disease)   . History of kidney stones   . Hypertension   . 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 Obesity   . Renal stone 04/2012  . Seasonal allergic rhinitis   .  Sleep apnea    does not use a cpap machine    Current Outpatient Medications  Medication Sig Dispense Refill  . acetaminophen (TYLENOL) 325 MG tablet Take 2 tablets (650 mg total) by mouth every 6 (six) hours as needed for mild pain or headache.    . albuterol (PROVENTIL HFA;VENTOLIN HFA) 108 (90 Base) MCG/ACT inhaler Inhale 2 puffs into the lungs every 6 (six) hours as needed for wheezing or shortness of breath.    Marland Kitchen amiodarone (PACERONE) 200 MG tablet TAKE 1 TABLET BY MOUTH EVERY DAY 90 tablet 1  . apixaban (ELIQUIS) 5 MG TABS tablet Take 1 tablet (5 mg total) by mouth 2 (two) times daily. 180 tablet 3  . famotidine (PEPCID) 20 MG tablet Take 20 mg by mouth daily.    . furosemide (LASIX) 40 MG tablet TAKE 1 TABLET BY MOUTH EVERY DAY 90 tablet 1  . hydrALAZINE (APRESOLINE) 50 MG tablet TAKE 1 TABLET BY MOUTH THREE TIMES A DAY (Patient taking differently: 25 mg 3 (three) times daily. ) 270 tablet 2  . potassium chloride (K-DUR) 10 MEQ tablet TAKE 2 TABLETS (20 MEQ TOTAL) BY MOUTH 2 (TWO) TIMES DAILY. 360 tablet 1  . sacubitril-valsartan (ENTRESTO) 97-103 MG Take 1 tablet by mouth 2 (two) times daily. 180 tablet 3  . tadalafil (CIALIS) 5 MG tablet Take 5 mg by mouth daily at 12 noon.    . tamsulosin (FLOMAX) 0.4  MG CAPS capsule Take 0.8 mg by mouth at bedtime.     No current facility-administered medications for this encounter.     Allergies  Allergen Reactions  . Ibuprofen Other (See Comments)    Has a-Fib, unable to take  . Isosorbide Other (See Comments)    HEADACHES from Imdur  . Lisinopril Cough  . Digoxin And Related Rash      Social History   Socioeconomic History  . Marital status: Married    Spouse name: Not on file  . Number of children: Not on file  . Years of education: Not on file  . Highest education level: Not on file  Occupational History  . Occupation: GREENHOUSE MANAGEMENT  Social Needs  . Financial resource strain: Not on file  . Food insecurity     Worry: Not on file    Inability: Not on file  . Transportation needs    Medical: Not on file    Non-medical: Not on file  Tobacco Use  . Smoking status: Never Smoker  . Smokeless tobacco: Never Used  Substance and Sexual Activity  . Alcohol use: No  . Drug use: No  . Sexual activity: Not on file  Lifestyle  . Physical activity    Days per week: Not on file    Minutes per session: Not on file  . Stress: Not on file  Relationships  . Social Herbalist on phone: Not on file    Gets together: Not on file    Attends religious service: Not on file    Active member of club or organization: Not on file    Attends meetings of clubs or organizations: Not on file    Relationship status: Not on file  . Intimate partner violence    Fear of current or ex partner: Not on file    Emotionally abused: Not on file    Physically abused: Not on file    Forced sexual activity: Not on file  Other Topics Concern  . Not on file  Social History Narrative  . Not on file      Family History  Problem Relation Age of Onset  . Hypertension Mother     Vitals:   08/01/18 1358  BP: 126/80  Pulse: (!) 45  SpO2: 97%  Weight: 103.4 kg (228 lb)   Wt Readings from Last 3 Encounters:  08/01/18 103.4 kg (228 lb)  09/22/17 101.5 kg (223 lb 12.8 oz)  08/08/17 104.3 kg (230 lb)     PHYSICAL EXAM: General:  Well appearing. No resp difficulty HEENT: normal Neck: supple. no JVD. Carotids 2+ bilat; no bruits. No lymphadenopathy or thryomegaly appreciated. Cor: PMI nondisplaced. Brady Regular rate & rhythm. No rubs, gallops or murmurs. Lungs: clear Abdomen: soft, nontender, nondistended. No hepatosplenomegaly. No bruits or masses. Good bowel sounds. Extremities: no cyanosis, clubbing, rash, edema Neuro: alert & orientedx3, cranial nerves grossly intact. moves all 4 extremities w/o difficulty. Affect pleasant   EKG: Sinus Brady 49 bpm   ASSESSMENT & PLAN: 1. Chronic systolic CHF: EF  81-01%.  NICM, felt to be tachy mediated. ? AF. - Echo 06/2016 LVEF 40%. - Echo 02/2017: EF 40-45% -ECHO today discussed and reviewed by Dr Haroldine Laws 50-55%.   - Continue lasix 40 mg every day.   - Continue Entresto to 97/103 BID. - Intolerant to Imdur, caused headaches.  - Continue hydralazine 25 mg TID.     - No BB with bradycardia. - We had previously discussed  proceeding with coronary angio to exclude CAD but as he is doing well and no angina and CKD will hold off. If develops symptoms or EF worse can reconsider.  2. Paroxsymal atrial fibrillation - Heart rate in the 40s.  - Cut amiodarone 100 mg daily.  Check LFTs, TSH, CMET,.   - Continue Eliquis. No bleeding issues.  3. Hypertension - Stable. 4. History of LV thrombus - Resolved. No change.  5. Chronic kidney disease stage III-IV:  - baseline creatinine 1.8-2.0.  -- Had renal cysts and hydro on CT when he had kidney stone - Followed by Nephrology.  6. OSA - Sleep study 4/18 with mild OSA. AHI 13.8/hr.  - Ongoing day time. Needs CPAP.   Follow up in 1 year.    Darrick Grinder, NP  2:40 PM  08/01/18   Patient seen and examined with Darrick Grinder, NP. We discussed all aspects of the encounter. I agree with the assessment and plan as stated above.   He is doing great. Echo today EF 50%. Maintaining NSR. Rate is slow. Cut back amio to 100 daily. Continue Eliquis. Labs ok. With creatinine 1.9 today.   Glori Bickers, MD  3:54 PM

## 2018-08-03 ENCOUNTER — Telehealth (HOSPITAL_COMMUNITY): Payer: Self-pay

## 2018-08-03 DIAGNOSIS — I5022 Chronic systolic (congestive) heart failure: Secondary | ICD-10-CM

## 2018-08-03 MED ORDER — POTASSIUM CHLORIDE ER 10 MEQ PO TBCR: EXTENDED_RELEASE_TABLET | ORAL | 6 refills | Status: DC

## 2018-08-03 NOTE — Telephone Encounter (Signed)
-----   Message from Jolaine Artist, MD sent at 08/03/2018 10:42 AM EDT ----- Potassium low. Give him 40 kcl today then 20 per day after that. Repeat 2 weeks

## 2018-08-03 NOTE — Telephone Encounter (Signed)
Order clarified with Dr Haroldine Laws, pt to take an extra 83meq today and increase by 20 meq daily.  Pt will take 52meq am/ 44meq evening every day. Pt verbalized understanding and will rtc in 2 weeks for repeat bmet.

## 2018-08-07 ENCOUNTER — Other Ambulatory Visit (HOSPITAL_COMMUNITY): Payer: BLUE CROSS/BLUE SHIELD

## 2018-08-07 ENCOUNTER — Encounter (HOSPITAL_COMMUNITY): Payer: BLUE CROSS/BLUE SHIELD | Admitting: Internal Medicine

## 2018-08-17 ENCOUNTER — Other Ambulatory Visit: Payer: Self-pay

## 2018-08-17 ENCOUNTER — Ambulatory Visit (HOSPITAL_COMMUNITY)
Admission: RE | Admit: 2018-08-17 | Discharge: 2018-08-17 | Disposition: A | Discharge status: Home / Self Care | Payer: BC Managed Care – PPO | Source: Ambulatory Visit | Attending: Internal Medicine | Admitting: Internal Medicine

## 2018-08-17 DIAGNOSIS — I5022 Chronic systolic (congestive) heart failure: Secondary | ICD-10-CM | POA: Insufficient documentation

## 2018-08-17 LAB — BASIC METABOLIC PANEL
Anion gap: 7 (ref 5–15)
BUN: 27 mg/dL — ABNORMAL HIGH (ref 8–23)
CO2: 20 mmol/L — ABNORMAL LOW (ref 22–32)
Calcium: 8.7 mg/dL — ABNORMAL LOW (ref 8.9–10.3)
Chloride: 111 mmol/L (ref 98–111)
Creatinine, Ser: 2.29 mg/dL — ABNORMAL HIGH (ref 0.61–1.24)
GFR calc Af Amer: 34 mL/min — ABNORMAL LOW (ref >60)
GFR calc non Af Amer: 29 mL/min — ABNORMAL LOW (ref >60)
Glucose, Bld: 76 mg/dL (ref 70–99)
Potassium: 4 mmol/L (ref 3.5–5.1)
Sodium: 138 mmol/L (ref 135–145)

## 2018-09-11 ENCOUNTER — Encounter (HOSPITAL_COMMUNITY): Payer: Self-pay | Admitting: *Deleted

## 2018-09-11 ENCOUNTER — Encounter (HOSPITAL_COMMUNITY): Payer: Self-pay | Admitting: Internal Medicine

## 2018-09-11 ENCOUNTER — Other Ambulatory Visit: Payer: Self-pay

## 2018-09-11 ENCOUNTER — Other Ambulatory Visit (HOSPITAL_COMMUNITY): Payer: Self-pay

## 2018-09-11 ENCOUNTER — Telehealth (HOSPITAL_COMMUNITY): Payer: Self-pay | Admitting: *Deleted

## 2018-09-11 ENCOUNTER — Other Ambulatory Visit (HOSPITAL_COMMUNITY)
Admission: RE | Admit: 2018-09-11 | Discharge: 2018-09-11 | Disposition: A | Discharge status: Home / Self Care | Payer: BC Managed Care – PPO | Source: Ambulatory Visit | Attending: Internal Medicine | Admitting: Internal Medicine

## 2018-09-11 ENCOUNTER — Ambulatory Visit (HOSPITAL_COMMUNITY)
Admission: RE | Admit: 2018-09-11 | Discharge: 2018-09-11 | Disposition: A | Discharge status: Home / Self Care | Payer: BC Managed Care – PPO | Source: Ambulatory Visit | Attending: Internal Medicine | Admitting: Internal Medicine

## 2018-09-11 VITALS — BP 100/60 | HR 117 | Wt 222.5 lb

## 2018-09-11 DIAGNOSIS — R531 Weakness: Secondary | ICD-10-CM | POA: Diagnosis not present

## 2018-09-11 DIAGNOSIS — R42 Dizziness and giddiness: Secondary | ICD-10-CM | POA: Diagnosis not present

## 2018-09-11 DIAGNOSIS — I428 Other cardiomyopathies: Secondary | ICD-10-CM | POA: Diagnosis not present

## 2018-09-11 DIAGNOSIS — I480 Paroxysmal atrial fibrillation: Secondary | ICD-10-CM | POA: Insufficient documentation

## 2018-09-11 DIAGNOSIS — I130 Hypertensive heart and chronic kidney disease with heart failure and stage 1 through stage 4 chronic kidney disease, or unspecified chronic kidney disease: Secondary | ICD-10-CM | POA: Insufficient documentation

## 2018-09-11 DIAGNOSIS — J45909 Unspecified asthma, uncomplicated: Secondary | ICD-10-CM | POA: Insufficient documentation

## 2018-09-11 DIAGNOSIS — Z7901 Long term (current) use of anticoagulants: Secondary | ICD-10-CM | POA: Insufficient documentation

## 2018-09-11 DIAGNOSIS — R002 Palpitations: Secondary | ICD-10-CM | POA: Diagnosis not present

## 2018-09-11 DIAGNOSIS — K219 Gastro-esophageal reflux disease without esophagitis: Secondary | ICD-10-CM | POA: Diagnosis not present

## 2018-09-11 DIAGNOSIS — Z20828 Contact with and (suspected) exposure to other viral communicable diseases: Secondary | ICD-10-CM | POA: Diagnosis not present

## 2018-09-11 DIAGNOSIS — G4733 Obstructive sleep apnea (adult) (pediatric): Secondary | ICD-10-CM | POA: Diagnosis not present

## 2018-09-11 DIAGNOSIS — E782 Mixed hyperlipidemia: Secondary | ICD-10-CM | POA: Insufficient documentation

## 2018-09-11 DIAGNOSIS — Z79899 Other long term (current) drug therapy: Secondary | ICD-10-CM | POA: Insufficient documentation

## 2018-09-11 DIAGNOSIS — I5022 Chronic systolic (congestive) heart failure: Secondary | ICD-10-CM | POA: Insufficient documentation

## 2018-09-11 DIAGNOSIS — N184 Chronic kidney disease, stage 4 (severe): Secondary | ICD-10-CM | POA: Insufficient documentation

## 2018-09-11 LAB — SARS CORONAVIRUS 2 (TAT 6-24 HRS): SARS Coronavirus 2: NEGATIVE

## 2018-09-11 MED ORDER — AMIODARONE HCL 200 MG PO TABS: 400.0000 mg | ORAL_TABLET | Freq: Two times a day (BID) | ORAL | 3 refills | Status: DC

## 2018-09-11 NOTE — Telephone Encounter (Signed)
Pt called c/o being dizzy and having an irregular heart rate. pts asked if he could come in for ekg. Nurse visit scheduled for ekg.

## 2018-09-11 NOTE — Patient Instructions (Signed)
Take Amiodarone 400mg  twice daily.  Stop Hydralazine  You have been referred to [facility] (they will contact you to schedule)  You have been scheduled for cardioversion Wednesday 09/13/2018 (please see attached instruction sheet)  Pre procedure Covid-19 test today at 12:55pm (please see attached address and directions)  Follow up in 1 month

## 2018-09-11 NOTE — Progress Notes (Signed)
Advanced Heart Failure Clinic Note    Primary Cardiologist: Dr. Johnsie Cancel HF: Dr Haroldine Laws  PCP: Dr Laurann Montana Urology:  Dr Diona Fanti   HPI: [name] is a 63 year old male with a past medical history of tachy mediated cardiomyopathy, atrial fibrillation (on Eliquis), systolic CHF (EF 0000000).   He was first diagnosed with Afib in 02/2016, seen in the Afib clinic and the ED for this. He underwent 2 DC-CV and only held NSR for a few days. He started Amiodarone.   Admitted 04/16/16-04/27/16 with rapid afib and decompensation. He was started on milrinone for marginal mixed venous sat of 57% and optimization with plans for repeat DCCV after loading with IV Amiodarone. Underwent TEE/DCCV on 04/23/16 with restoration of NSR.He was continued on Eliquis for anticoagulation at discharge, as he did have some evidence of LV thrombus on Echo, however it appeared to have resolved when he had a TEE on 04/23/16. Overall diuresed 9.5L and discharge weight was 217 pounds.   July 2019 he had kidney stones and underwent lithotripsy.   Seen last month for HF follow up. Doing very well. EF up to 50-55%. Was maintaining NSR on amio 200 daily. Amio cut back to 100 daily due to sinus brady with HR in 40s. 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 Felt better over the weekend. However this morning checked his pulse and was very irregular and he felt lightheaded so came today for RN check. ECG shows recurrent AF with RVR @ rate 126. Feels weak. Has not missed any doses for Eliquis. Has never got his CPAP.    Echo 3/18 EF 40-45% RV mildly HK  Echo 2/19 EF 40-45% RV mildly reduced ECHO 08/01/2018 EF 50-55%   Review of systems complete and found to be negative unless listed in HPI.   Past Medical History:  Diagnosis Date  . A-fib ([place])   . Asthma, persistent   . BPH (benign prostatic hyperplasia) 2014  . CHF (congestive heart failure) ([place])   . Combined  hyperlipidemia   . GERD (gastroesophageal reflux disease)   . History of kidney stones   . Hypertension   . 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 Obesity   . Renal stone 04/2012  . Seasonal allergic rhinitis   . Sleep apnea    does not use a cpap machine    Current Outpatient Medications  Medication Sig Dispense Refill  . acetaminophen (TYLENOL) 325 MG tablet Take 2 tablets (650 mg total) by mouth every 6 (six) hours as needed for mild pain or headache.    . albuterol (PROVENTIL HFA;VENTOLIN HFA) 108 (90 Base) MCG/ACT inhaler Inhale 2 puffs into the lungs every 6 (six) hours as needed for wheezing or shortness of breath.    Marland Kitchen amiodarone (PACERONE) 100 MG tablet Take 1 tablet (100 mg total) by mouth daily. 90 tablet 3  . apixaban (ELIQUIS) 5 MG TABS tablet Take 1 tablet (5 mg total) by mouth 2 (two) times daily. 180 tablet 3  . famotidine (PEPCID) 20 MG tablet Take 20 mg by mouth daily.    . furosemide (LASIX) 40 MG tablet TAKE 1 TABLET BY MOUTH EVERY DAY 90 tablet 1  . hydrALAZINE (APRESOLINE) 25 MG tablet Take 1 tablet (25 mg total) by mouth 3 (three) times daily. 270 tablet 3  . potassium chloride (K-DUR) 10 MEQ tablet Take 3 tablets in the AM and 2 tablets in the PM    . sacubitril-valsartan (  ENTRESTO) 97-103 MG Take 1 tablet by mouth 2 (two) times daily. 180 tablet 3  . tadalafil (CIALIS) 5 MG tablet Take 5 mg by mouth daily at 12 noon.    . tamsulosin (FLOMAX) 0.4 MG CAPS capsule Take 0.8 mg by mouth at bedtime.     No current facility-administered medications for this encounter.     Allergies  Allergen Reactions  . Ibuprofen Other (See Comments)    Has a-Fib, unable to take  . Isosorbide Other (See Comments)    HEADACHES from Imdur  . Lisinopril Cough  . Digoxin And Related Rash      Social History   Socioeconomic History  . Marital status: Married    Spouse name: Not on file  . Number of children: Not on file  . Years of education: Not on file  . Highest  education level: Not on file  Occupational History  . Occupation: GREENHOUSE MANAGEMENT  Social Needs  . Financial resource strain: Not on file  . Food insecurity    Worry: Not on file    Inability: Not on file  . Transportation needs    Medical: Not on file    Non-medical: Not on file  Tobacco Use  . Smoking status: Never Smoker  . Smokeless tobacco: Never Used  Substance and Sexual Activity  . Alcohol use: No  . Drug use: No  . Sexual activity: Not on file  Lifestyle  . Physical activity    Days per week: Not on file    Minutes per session: Not on file  . Stress: Not on file  Relationships  . Social Herbalist on phone: Not on file    Gets together: Not on file    Attends religious service: Not on file    Active member of club or organization: Not on file    Attends meetings of clubs or organizations: Not on file    Relationship status: Not on file  . Intimate partner violence    Fear of current or ex partner: Not on file    Emotionally abused: Not on file    Physically abused: Not on file    Forced sexual activity: Not on file  Other Topics Concern  . Not on file  Social History Narrative  . Not on file      Family History  Problem Relation Age of Onset  . Hypertension Mother     Vitals:   09/11/18 1055  BP: 100/60  Pulse: (!) 117  Weight: 100.9 kg (222 lb 8 oz)   Wt Readings from Last 3 Encounters:  09/11/18 100.9 kg (222 lb 8 oz)  08/01/18 103.4 kg (228 lb)  09/22/17 101.5 kg (223 lb 12.8 oz)     PHYSICAL EXAM: General:  Well appearing. No resp difficulty HEENT: normal Neck: supple. no JVD. Carotids 2+ bilat; no bruits. No lymphadenopathy or thryomegaly appreciated. Cor: PMI nondisplaced. Irregular tachy. No rubs, gallops or murmurs. Lungs: clear Abdomen: soft, nontender, nondistended. No hepatosplenomegaly. No bruits or masses. Good bowel sounds. Extremities: no cyanosis, clubbing, rash, edema Neuro: alert & orientedx3, cranial  nerves grossly intact. moves all 4 extremities w/o difficulty. Affect pleasant  EKG: AF 126 with RVR + RBBB Personally reviewed   ASSESSMENT & PLAN: 1. Chronic systolic CHF: EF 0000000.  NICM, felt to be tachy mediated. ? AF. - Echo 06/2016 LVEF 40%. - Echo 02/2017: EF 40-45% -ECHO 7/20 EF 50-55%.   - Continue lasix 40 mg every day.   -  Continue Entresto to 97/103 BID. - Intolerant to Imdur, caused headaches.  - Continue hydralazine 25 mg TID.     - No BB with bradycardia. - We had previously discussed proceeding with coronary angio to exclude CAD but as he is doing well and no angina and CKD will hold off. If develops symptoms or EF worse can reconsider.   2. Paroxsymal atrial fibrillation - Amio cut back to 100 daily in July due to sinus bradycardia - Now back in AF with RVR - Will reload amio at 400 bid and plan repeat DC-CV this week - Will refer to Dr. Rayann Heman for evaluation of AF ablation (PVI) - Continue Eliquis. No bleeding issues.   3. Hypertension - Stable.  4. History of LV thrombus - Resolved. No change.   5. Chronic kidney disease stage III-IV:  - baseline creatinine 1.8-2.0.  -- Had renal cysts and hydro on CT when he had kidney stone - Followed by Nephrology.   6. OSA - Sleep study 4/18 with mild OSA. AHI 13.8/hr.  - Has never gotten CPAP.Will repeat home sleep study when out of AF and then refer back to Dr. Beverly Milch, MD  11:01 AM  09/11/18

## 2018-09-12 ENCOUNTER — Encounter (HOSPITAL_COMMUNITY): Payer: Self-pay | Admitting: Anesthesiology

## 2018-09-12 ENCOUNTER — Other Ambulatory Visit (HOSPITAL_COMMUNITY): Payer: Self-pay

## 2018-09-12 MED ORDER — AMIODARONE HCL 400 MG PO TABS: 400.0000 mg | ORAL_TABLET | Freq: Two times a day (BID) | ORAL | 3 refills | Status: DC

## 2018-09-12 NOTE — Progress Notes (Signed)
Pt stated he has been quarantined since his covid test. Pt denis shortness of breath or fevers. PT understands visitor policy

## 2018-09-12 NOTE — Anesthesia Preprocedure Evaluation (Deleted)
Anesthesia Evaluation    Reviewed: Allergy & Precautions, Patient's Chart, lab work & pertinent test results  History of Anesthesia Complications Negative for: history of anesthetic complications  Airway        Dental   Pulmonary asthma ,           Cardiovascular hypertension, Pt. on medications +CHF  + dysrhythmias Atrial Fibrillation  Rhythm:Irregular   1. The left ventricle has a visually estimated ejection fraction of 50%. The cavity size was normal. Left ventricular diastolic Doppler parameters are consistent with impaired relaxation. Left ventricular diffuse hypokinesis.  2. The right ventricle has normal systolic function. The cavity was normal. There is no increase in right ventricular wall thickness.  3. Left atrial size was mildly dilated.  4. Right atrial size was mildly dilated.  5. No evidence of mitral valve stenosis. Trivial mitral regurgitation.  6. The aortic valve is tricuspid. Aortic valve regurgitation is trivial by color flow Doppler. No stenosis of the aortic valve.  7. The aortic root is normal in size and structure.  8. The inferior vena cava was dilated in size with >50% respiratory variability. PA systolic pressure 32 mmHg.    Neuro/Psych negative neurological ROS     GI/Hepatic Neg liver ROS, GERD  ,  Endo/Other  negative endocrine ROS  Renal/GU Renal InsufficiencyRenal disease     Musculoskeletal   Abdominal   Peds  Hematology negative hematology ROS (+)   Anesthesia Other Findings   Reproductive/Obstetrics                             Anesthesia Physical  Anesthesia Plan  ASA: III  Anesthesia Plan: General   Post-op Pain Management:    Induction: Intravenous  PONV Risk Score and Plan: Treatment may vary due to age or medical condition  Airway Management Planned: LMA and Mask  Additional Equipment:   Intra-op Plan:   Post-operative Plan:    Informed Consent:   Plan Discussed with:   Anesthesia Plan Comments:         Anesthesia Quick Evaluation

## 2018-09-13 ENCOUNTER — Encounter (HOSPITAL_COMMUNITY)
Admission: RE | Disposition: A | Discharge status: Home / Self Care | Payer: Self-pay | Source: Home / Self Care | Attending: Internal Medicine

## 2018-09-13 ENCOUNTER — Telehealth (HOSPITAL_COMMUNITY): Payer: Self-pay | Admitting: *Deleted

## 2018-09-13 ENCOUNTER — Ambulatory Visit (HOSPITAL_COMMUNITY)
Admission: RE | Admit: 2018-09-13 | Discharge: 2018-09-13 | Disposition: A | Discharge status: Home / Self Care | Payer: BC Managed Care – PPO | Attending: Internal Medicine | Admitting: Internal Medicine

## 2018-09-13 DIAGNOSIS — I4891 Unspecified atrial fibrillation: Secondary | ICD-10-CM | POA: Insufficient documentation

## 2018-09-13 SURGERY — CANCELLED PROCEDURE

## 2018-09-13 NOTE — Telephone Encounter (Signed)
Pt verbalized understanding.

## 2018-09-13 NOTE — Telephone Encounter (Signed)
Pt left VM asking If he needed to make any medication changes since his ekg showed NSR and his DCCV was canceled. Pt also asks if he can resume normal activity now that his heart is in rhythm.   Routed to [place] for advice

## 2018-09-13 NOTE — Telephone Encounter (Signed)
Continue amio 400 bid for 2 weeks then cut back to 200 daily. Can return to normal activities

## 2018-09-13 NOTE — Progress Notes (Signed)
Pt presents today for cardioversion secondary to afib. Pre procedure EKG obtained and pt noted to be in SB. Dr Haroldine Laws paged and states pt was OK to go, he would follow up with pt via telephone.

## 2018-09-14 ENCOUNTER — Telehealth (HOSPITAL_COMMUNITY): Payer: Self-pay | Admitting: *Deleted

## 2018-09-14 NOTE — Telephone Encounter (Signed)
Whats BP?

## 2018-09-14 NOTE — Telephone Encounter (Signed)
Pt left vm asking if he should restart hydralazine.   Routed to [place] for advice

## 2018-09-14 NOTE — Telephone Encounter (Signed)
Left vm asking pt to return my call with bp readings

## 2018-09-19 DIAGNOSIS — R8271 Bacteriuria: Secondary | ICD-10-CM | POA: Diagnosis not present

## 2018-09-19 DIAGNOSIS — I5020 Unspecified systolic (congestive) heart failure: Secondary | ICD-10-CM | POA: Diagnosis not present

## 2018-09-19 DIAGNOSIS — Z0000 Encounter for general adult medical examination without abnormal findings: Secondary | ICD-10-CM | POA: Diagnosis not present

## 2018-09-19 DIAGNOSIS — I10 Essential (primary) hypertension: Secondary | ICD-10-CM | POA: Diagnosis not present

## 2018-09-19 DIAGNOSIS — Z8249 Family history of ischemic heart disease and other diseases of the circulatory system: Secondary | ICD-10-CM | POA: Diagnosis not present

## 2018-09-19 DIAGNOSIS — E782 Mixed hyperlipidemia: Secondary | ICD-10-CM | POA: Diagnosis not present

## 2018-09-19 DIAGNOSIS — N183 Chronic kidney disease, stage 3 (moderate): Secondary | ICD-10-CM | POA: Diagnosis not present

## 2018-09-26 ENCOUNTER — Other Ambulatory Visit (HOSPITAL_COMMUNITY): Payer: Self-pay | Admitting: Internal Medicine

## 2018-10-03 ENCOUNTER — Other Ambulatory Visit (HOSPITAL_COMMUNITY): Payer: Self-pay | Admitting: Internal Medicine

## 2018-10-03 DIAGNOSIS — N390 Urinary tract infection, site not specified: Secondary | ICD-10-CM | POA: Diagnosis not present

## 2018-10-15 NOTE — Progress Notes (Addendum)
Advanced Heart Failure Clinic Note    Primary Cardiologist: Dr. Johnsie Cancel HF: Dr Haroldine Laws  PCP: Dr Laurann Montana Urology:  Dr Diona Fanti   HPI: [name] is a 63 year old male with a past medical history of tachy mediated cardiomyopathy, atrial fibrillation (on Eliquis), systolic CHF (EF 0000000).   He was first diagnosed with Afib in 02/2016, seen in the Afib clinic and the ED for this. He underwent 2 DC-CV and only held NSR for a few days. He started Amiodarone.   Admitted 04/16/16-04/27/16 with rapid afib and decompensation. He was started on milrinone for marginal mixed venous sat of 57% and optimization with plans for repeat DCCV after loading with IV Amiodarone. Underwent TEE/DCCV on 04/23/16 with restoration of NSR.He was continued on Eliquis for anticoagulation at discharge, as he did have some evidence of LV thrombus on Echo, however it appeared to have resolved when he had a TEE on 04/23/16. Overall diuresed 9.5L and discharge weight was 217 pounds.   July 2019 he had kidney stones and underwent lithotripsy.   Echo 7/20 EF 50-55%.   Here for routine f/u/ We saw him in July and was doing very well. EF 50-55%  Maintaining NSR on amio 200 daily. Amio cut back to 100 daily due to sinus brady with HR in 40s. Several weeks later presented for a sick visit with recurrent AF with RVR, We reloaded amio with 400 bid and scheduled DC-CV. Several days later presented for DC-CV and was in NSR. Since that time has felt better. Took amio 400 bid for 2 weeks and now back to 200 daily. Says when he stands up he gets dizzy. Taking lasix 40mg  daily. SBP at home 110-120    Echo 3/18 EF 40-45% RV mildly HK  Echo 2/19 EF 40-45% RV mildly reduced ECHO 08/01/2018 EF 50-55%   Review of systems complete and found to be negative unless listed in HPI.   Past Medical History:  Diagnosis Date  . A-fib ([place])   . Asthma, persistent   . BPH (benign prostatic hyperplasia) 2014  . CHF (congestive heart failure) ([place])   .  Combined hyperlipidemia   . GERD (gastroesophageal reflux disease)   . History of kidney stones   . Hypertension   . 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 Obesity   . Renal stone 04/2012  . Seasonal allergic rhinitis   . Sleep apnea    does not use a cpap machine    Current Outpatient Medications  Medication Sig Dispense Refill  . acetaminophen (TYLENOL) 325 MG tablet Take 2 tablets (650 mg total) by mouth every 6 (six) hours as needed for mild pain or headache.    . albuterol (PROVENTIL HFA;VENTOLIN HFA) 108 (90 Base) MCG/ACT inhaler Inhale 2 puffs into the lungs every 6 (six) hours as needed for wheezing or shortness of breath.    Marland Kitchen amiodarone (PACERONE) 400 MG tablet Take 1 tablet (400 mg total) by mouth 2 (two) times daily. 180 tablet 3  . apixaban (ELIQUIS) 5 MG TABS tablet Take 1 tablet (5 mg total) by mouth 2 (two) times daily. 180 tablet 3  . famotidine (PEPCID) 20 MG tablet Take 20 mg by mouth daily.    . Fluticasone-Salmeterol (ADVAIR) 250-50 MCG/DOSE AEPB Inhale 1 puff into the lungs 2 (two) times daily.    . furosemide (LASIX) 40 MG tablet TAKE 1 TABLET BY MOUTH EVERY DAY (Patient taking differently: Take 40 mg by mouth daily. ) 90 tablet 1  . potassium  chloride (K-DUR) 10 MEQ tablet Take 20-30 mEq by mouth See admin instructions. Take 3 tablets (30 meq) by mouth in the morning & take 2 tablets (20 meq) by mouth in the evening.    . potassium chloride (K-DUR) 10 MEQ tablet TAKE 2 TABLETS (20 MEQ TOTAL) BY MOUTH 2 (TWO) TIMES DAILY. 360 tablet 1  . sacubitril-valsartan (ENTRESTO) 97-103 MG Take 1 tablet by mouth 2 (two) times daily. 180 tablet 3  . tadalafil (CIALIS) 5 MG tablet Take 5 mg by mouth daily.     . tamsulosin (FLOMAX) 0.4 MG CAPS capsule Take 0.8 mg by mouth at bedtime.     No current facility-administered medications for this encounter.     Allergies  Allergen Reactions  . Ibuprofen Other (See Comments)    Has a-Fib, unable to take  . Isosorbide Other  (See Comments)    HEADACHES from Imdur  . Lisinopril Cough  . Digoxin And Related Rash      Social History   Socioeconomic History  . Marital status: Married    Spouse name: Not on file  . Number of children: Not on file  . Years of education: Not on file  . Highest education level: Not on file  Occupational History  . Occupation: GREENHOUSE MANAGEMENT  Social Needs  . Financial resource strain: Not on file  . Food insecurity    Worry: Not on file    Inability: Not on file  . Transportation needs    Medical: Not on file    Non-medical: Not on file  Tobacco Use  . Smoking status: Never Smoker  . Smokeless tobacco: Never Used  Substance and Sexual Activity  . Alcohol use: No  . Drug use: No  . Sexual activity: Not on file  Lifestyle  . Physical activity    Days per week: Not on file    Minutes per session: Not on file  . Stress: Not on file  Relationships  . Social Herbalist on phone: Not on file    Gets together: Not on file    Attends religious service: Not on file    Active member of club or organization: Not on file    Attends meetings of clubs or organizations: Not on file    Relationship status: Not on file  . Intimate partner violence    Fear of current or ex partner: Not on file    Emotionally abused: Not on file    Physically abused: Not on file    Forced sexual activity: Not on file  Other Topics Concern  . Not on file  Social History Narrative  . Not on file      Family History  Problem Relation Age of Onset  . Hypertension Mother     Vitals:   10/16/18 1000  BP: (!) 142/90  Pulse: (!) 51  SpO2: 97%  Weight: 100.8 kg (222 lb 3.2 oz)   Wt Readings from Last 3 Encounters:  09/11/18 100.9 kg (222 lb 8 oz)  08/01/18 103.4 kg (228 lb)  09/22/17 101.5 kg (223 lb 12.8 oz)     PHYSICAL EXAM: General:  Well appearing. No resp difficulty HEENT: normal Neck: supple. no JVD. Carotids 2+ bilat; no bruits. No lymphadenopathy or  thryomegaly appreciated. Cor: PMI nondisplaced. Regular brady.  No rubs, gallops or murmurs. Lungs: clear Abdomen: soft, nontender, nondistended. No hepatosplenomegaly. No bruits or masses. Good bowel sounds. Extremities: no cyanosis, clubbing, rash, edema Neuro: alert & orientedx3, cranial  nerves grossly intact. moves all 4 extremities w/o difficulty. Affect pleasant  EKG: SB 45 RBBB Personally reviewed   ASSESSMENT & PLAN: 1. Chronic systolic CHF: EF 0000000.  NICM, felt to be tachy mediated. ? AF. - Echo 06/2016 LVEF 40%. - Echo 02/2017: EF 40-45% - ECHO 7/20 EF 50-55%.   - NYHA I. Volume status ok  - Having orthostasis. Will change lasix to 40mg  on Monday and Friday only. Can stop completely if not needed. Bradycardia may also be playing a role.  - Continue Entresto to 97/103 BID. - Intolerant to Imdur, caused headaches.  - Off hydralazine 25 mg TID -  Will not restart now with orthostasis - No BB with bradycardia. - We had previously discussed proceeding with coronary angio to exclude CAD but as he is doing well and no angina and CKD will hold off. If develops symptoms or EF worse can reconsider.   2. Paroxsymal atrial fibrillation - Amio cut back to 100 daily in July due to sinus bradycardia and developed AF with RVR - Amio reloaded and converted to NSR - Now back on amio 200 daily. Maintaining NSR but is quite slow.  - Has televisit with Dr. Rayann Heman next week for evaluation of AF ablation (PVI) and possible need for PPM - Continue Eliquis. No bleeding issues.   3. Hypertension - BP high here. But well controlled at home.   4. History of LV thrombus - Resolved. No change.   5. Chronic kidney disease stage III-IV:  - baseline creatinine 1.8-2.0.  -- Had renal cysts and hydro on CT when he had kidney stone - Followed by Nephrology.  - check labs today  6. OSA - Sleep study 4/18 with mild OSA. AHI 13.8/hr.  - Has never gotten CPAP.Plan was to repeat home sleep study when  out of AF and then refer back to Dr. Beverly Milch, MD  11:08 PM  10/15/18

## 2018-10-16 ENCOUNTER — Ambulatory Visit (HOSPITAL_COMMUNITY)
Admission: RE | Admit: 2018-10-16 | Discharge: 2018-10-16 | Disposition: A | Discharge status: Home / Self Care | Payer: BC Managed Care – PPO | Source: Ambulatory Visit | Attending: Internal Medicine | Admitting: Internal Medicine

## 2018-10-16 ENCOUNTER — Other Ambulatory Visit: Payer: Self-pay

## 2018-10-16 VITALS — BP 142/90 | HR 51 | Wt 222.2 lb

## 2018-10-16 DIAGNOSIS — R42 Dizziness and giddiness: Secondary | ICD-10-CM | POA: Diagnosis not present

## 2018-10-16 DIAGNOSIS — I130 Hypertensive heart and chronic kidney disease with heart failure and stage 1 through stage 4 chronic kidney disease, or unspecified chronic kidney disease: Secondary | ICD-10-CM | POA: Insufficient documentation

## 2018-10-16 DIAGNOSIS — Z8249 Family history of ischemic heart disease and other diseases of the circulatory system: Secondary | ICD-10-CM | POA: Insufficient documentation

## 2018-10-16 DIAGNOSIS — Z7901 Long term (current) use of anticoagulants: Secondary | ICD-10-CM | POA: Diagnosis not present

## 2018-10-16 DIAGNOSIS — G4733 Obstructive sleep apnea (adult) (pediatric): Secondary | ICD-10-CM | POA: Diagnosis not present

## 2018-10-16 DIAGNOSIS — Z79899 Other long term (current) drug therapy: Secondary | ICD-10-CM | POA: Insufficient documentation

## 2018-10-16 DIAGNOSIS — E782 Mixed hyperlipidemia: Secondary | ICD-10-CM | POA: Diagnosis not present

## 2018-10-16 DIAGNOSIS — I480 Paroxysmal atrial fibrillation: Secondary | ICD-10-CM | POA: Diagnosis not present

## 2018-10-16 DIAGNOSIS — K219 Gastro-esophageal reflux disease without esophagitis: Secondary | ICD-10-CM | POA: Insufficient documentation

## 2018-10-16 DIAGNOSIS — N184 Chronic kidney disease, stage 4 (severe): Secondary | ICD-10-CM | POA: Diagnosis not present

## 2018-10-16 DIAGNOSIS — I428 Other cardiomyopathies: Secondary | ICD-10-CM | POA: Diagnosis not present

## 2018-10-16 DIAGNOSIS — I5022 Chronic systolic (congestive) heart failure: Secondary | ICD-10-CM

## 2018-10-16 DIAGNOSIS — J45909 Unspecified asthma, uncomplicated: Secondary | ICD-10-CM | POA: Insufficient documentation

## 2018-10-16 LAB — BASIC METABOLIC PANEL
Anion gap: 9 (ref 5–15)
BUN: 19 mg/dL (ref 8–23)
CO2: 21 mmol/L — ABNORMAL LOW (ref 22–32)
Calcium: 8.6 mg/dL — ABNORMAL LOW (ref 8.9–10.3)
Chloride: 110 mmol/L (ref 98–111)
Creatinine, Ser: 1.79 mg/dL — ABNORMAL HIGH (ref 0.61–1.24)
GFR calc Af Amer: 46 mL/min — ABNORMAL LOW (ref >60)
GFR calc non Af Amer: 39 mL/min — ABNORMAL LOW (ref >60)
Glucose, Bld: 91 mg/dL (ref 70–99)
Potassium: 4 mmol/L (ref 3.5–5.1)
Sodium: 140 mmol/L (ref 135–145)

## 2018-10-16 LAB — BRAIN NATRIURETIC PEPTIDE: B Natriuretic Peptide: 243.9 pg/mL — ABNORMAL HIGH (ref 0.0–100.0)

## 2018-10-16 NOTE — Patient Instructions (Addendum)
No medication changes today!  Labs today We will only contact you if something comes back abnormal or we need to make some changes. Otherwise no news is good news!  Your physician recommends that you schedule a follow-up appointment in: 3-4 months with Dr Haroldine Laws  At the [facility], you and your health needs are our priority. As part of our continuing mission to provide you with exceptional heart care, we have created designated Provider Care Teams. These Care Teams include your primary Cardiologist (physician) and Advanced Practice Providers (APPs- Physician Assistants and Nurse Practitioners) who all work together to provide you with the care you need, when you need it.   You may see any of the following providers on your designated Care Team at your next follow up: Marland Kitchen Dr Glori Bickers . Dr Loralie Champagne . Darrick Grinder, NP   Please be sure to bring in all your medications bottles to every appointment.

## 2018-10-23 DIAGNOSIS — N183 Chronic kidney disease, stage 3 (moderate): Secondary | ICD-10-CM | POA: Diagnosis not present

## 2018-10-23 DIAGNOSIS — Z87442 Personal history of urinary calculi: Secondary | ICD-10-CM | POA: Diagnosis not present

## 2018-10-23 DIAGNOSIS — I129 Hypertensive chronic kidney disease with stage 1 through stage 4 chronic kidney disease, or unspecified chronic kidney disease: Secondary | ICD-10-CM | POA: Diagnosis not present

## 2018-10-23 DIAGNOSIS — N281 Cyst of kidney, acquired: Secondary | ICD-10-CM | POA: Diagnosis not present

## 2018-10-24 DIAGNOSIS — G4733 Obstructive sleep apnea (adult) (pediatric): Secondary | ICD-10-CM | POA: Diagnosis not present

## 2018-10-25 ENCOUNTER — Other Ambulatory Visit: Payer: Self-pay

## 2018-10-25 ENCOUNTER — Telehealth (INDEPENDENT_AMBULATORY_CARE_PROVIDER_SITE_OTHER): Payer: BC Managed Care – PPO | Admitting: Internal Medicine

## 2018-10-25 ENCOUNTER — Encounter: Payer: Self-pay | Admitting: Internal Medicine

## 2018-10-25 ENCOUNTER — Telehealth: Payer: Self-pay

## 2018-10-25 VITALS — Ht 72.0 in | Wt 224.0 lb

## 2018-10-25 DIAGNOSIS — G4733 Obstructive sleep apnea (adult) (pediatric): Secondary | ICD-10-CM

## 2018-10-25 DIAGNOSIS — N390 Urinary tract infection, site not specified: Secondary | ICD-10-CM | POA: Diagnosis not present

## 2018-10-25 DIAGNOSIS — I10 Essential (primary) hypertension: Secondary | ICD-10-CM | POA: Diagnosis not present

## 2018-10-25 DIAGNOSIS — I4819 Other persistent atrial fibrillation: Secondary | ICD-10-CM | POA: Diagnosis not present

## 2018-10-25 DIAGNOSIS — R001 Bradycardia, unspecified: Secondary | ICD-10-CM

## 2018-10-25 NOTE — Telephone Encounter (Signed)
-----   Message from Thompson Grayer, MD sent at 10/25/2018  4:11 PM EDT ----- Afib ablation  C/I/A  Cardiac CT

## 2018-10-25 NOTE — Progress Notes (Signed)
Electrophysiology TeleHealth Note   Due to national recommendations of social distancing due to [place] 19, Audio  telehealth visit is felt to be most appropriate for this patient at this time.  See MyChart message from today for patient consent regarding telehealth for [facility].   Date:  10/25/2018   ID:  [name], DOB 01/19/1956, MRN [id number]  Location: home   Provider location: [place] Evaluation Performed: New patient consult  PCP:  [name], MD  Cardiologist:  Dr Haroldine Laws Electrophysiologist:  None   Chief Complaint:  afib  History of Present Illness:    [name] is a 63 y.o. male who presents via audio/video conferencing for a telehealth visit today.   The patient is referred for new consultation regarding afib by Dr Haroldine Laws.  He was initially diagnosed with atrial fibrillation in 2018 after presenting with SOB and fatigue.  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  He initially failed cardioversion and was placed on amiodarone.  With rhythm control, his EF has improved from 20% to 50%.  He did well for 2 years with low dose amiodarone.  (amiodarone had been reduced due to sinus bradycardia).   Recently, he has developed recurrent afib with RVR.  He reports noticing that his heart rate was elevated. Amiodarone was reloaded and he converted back to sinus rhythm.  He has had some postural dizziness since that time, but is otherwise doing well. He has recently begun using CPAP. Today, he denies symptoms of palpitations, chest pain, shortness of breath, orthopnea, PND, lower extremity edema, claudication, presyncope, syncope, bleeding, or neurologic sequela. The patient is tolerating medications without difficulties and is otherwise without complaint today.   he denies symptoms of cough, fevers, chills, or new SOB worrisome for COVID 19.   Past Medical History:  Diagnosis Date  . Asthma, persistent   . BPH (benign prostatic hyperplasia) 2014  .  CHF (congestive heart failure) ([place])   . Combined hyperlipidemia   . GERD (gastroesophageal reflux disease)   . History of kidney stones   . Hypertension   . 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 Obesity   . Persistent atrial fibrillation   . Renal stone 04/2012  . Seasonal allergic rhinitis   . Sleep apnea    does not use a cpap machine    Past Surgical History:  Procedure Laterality Date  . CARDIOVERSION N/A 04/23/2016   Procedure: CARDIOVERSION;  Surgeon: Jolaine Artist, MD;  Location: [facility] ENDOSCOPY;  Service: Cardiovascular;  Laterality: N/A;  . CYSTOSCOPY WITH RETROGRADE PYELOGRAM, URETEROSCOPY AND STENT PLACEMENT Left 07/29/2017   Procedure: CYSTOSCOPY WITH RETROGRADE PYELOGRAM AND LEFT STENT PLACEMENT;  Surgeon: Franchot Gallo, MD;  Location: [facility] ORS;  Service: Urology;  Laterality: Left;  . EXTRACORPOREAL SHOCK WAVE LITHOTRIPSY Left 08/08/2017   Procedure: LEFT EXTRACORPOREAL SHOCK WAVE LITHOTRIPSY (ESWL);  Surgeon: Nickie Retort, MD;  Location: [facility] ORS;  Service: Urology;  Laterality: Left;  . TEE WITHOUT CARDIOVERSION N/A 04/23/2016   Procedure: TRANSESOPHAGEAL ECHOCARDIOGRAM (TEE);  Surgeon: Jolaine Artist, MD;  Location: [facility] ENDOSCOPY;  Service: Cardiovascular;  Laterality: N/A;    Current Outpatient Medications  Medication Sig Dispense Refill  . acetaminophen (TYLENOL) 325 MG tablet Take 2 tablets (650 mg total) by mouth every 6 (six) hours as needed for mild pain or headache.    . albuterol (PROVENTIL HFA;VENTOLIN HFA) 108 (90 Base) MCG/ACT inhaler Inhale 2 puffs into the lungs every 6 (six) hours as needed  for wheezing or shortness of breath.    Marland Kitchen amiodarone (PACERONE) 200 MG tablet Take 200 mg by mouth daily.    Marland Kitchen apixaban (ELIQUIS) 5 MG TABS tablet Take 1 tablet (5 mg total) by mouth 2 (two) times daily. 180 tablet 3  . Fluticasone-Salmeterol (ADVAIR) 100-50 MCG/DOSE AEPB Inhale 1 puff into the lungs 2 (two) times daily.    . furosemide (LASIX) 40 MG  tablet Take 40 mg by mouth 2 (two) times a week. Mondays and Fridays    . pantoprazole (PROTONIX) 40 MG tablet Take 40 mg by mouth daily.    . potassium chloride (K-DUR) 10 MEQ tablet Take 20-30 mEq by mouth See admin instructions. Take 3 tablets (30 meq) by mouth in the morning & take 2 tablets (20 meq) by mouth in the evening.    . sacubitril-valsartan (ENTRESTO) 97-103 MG Take 1 tablet by mouth 2 (two) times daily. 180 tablet 3  . tadalafil (CIALIS) 5 MG tablet Take 5 mg by mouth daily.     . tamsulosin (FLOMAX) 0.4 MG CAPS capsule Take 0.8 mg by mouth at bedtime.     No current facility-administered medications for this visit.     Allergies:   Ibuprofen, Isosorbide, Lisinopril, and Digoxin and related   Social History:  The patient  reports that he has never smoked. He has never used smokeless tobacco. He reports that he does not drink alcohol or use drugs.   Family History:  The patient's family history includes Hypertension in his mother.    ROS:  Please see the history of present illness.   All other systems are personally reviewed and negative.    Exam:    Vital Signs:  Ht 6' (1.829 m)   Wt 224 lb (101.6 kg)   BMI 30.38 kg/m    Well sounding, alert and conversant , he has some vocal tremor on sustained phrases   Labs/Other Tests and Data Reviewed:    Recent Labs: 08/01/2018: ALT 19; Hemoglobin 12.9; Platelets 227 10/16/2018: B Natriuretic Peptide 243.9; BUN 19; Creatinine, Ser 1.79; Potassium 4.0; Sodium 140   Wt Readings from Last 3 Encounters:  10/25/18 224 lb (101.6 kg)  10/16/18 222 lb 3.2 oz (100.8 kg)  09/11/18 222 lb 8 oz (100.9 kg)     Other studies personally reviewed: Additional studies/ records that were reviewed today include: Dr Gillermina Hu notes, prior echo  Review of the above records today demonstrates: as above  Echo 08/01/18- EF 50%, LA 51mm  ekg 10/16/2018- sinus rhythm, RBBB  ASSESSMENT & PLAN:    1.  Persistent atrial fibrillation The patient  has symptomatic, recurrent persistent atrial fibrillation. he has failed medical therapy with amiodarone. Chads2vasc score is 2.  he is anticoagulated with eliquis . Therapeutic strategies for afib including medicine and ablation were discussed in detail with the patient today. Risk, benefits, and alternatives to EP study and radiofrequency ablation for afib were also discussed in detail today. These risks include but are not limited to stroke, bleeding, vascular damage, tamponade, perforation, damage to the esophagus, lungs, and other structures, pulmonary vein stenosis, worsening renal function, and death. The patient understands these risk and wishes to proceed.  We will therefore proceed with catheter ablation at the next available time.  Carto, ICE, anesthesia are requested for the procedure.  Will also obtain cardiac CT prior to the procedure to exclude LAA thrombus and further evaluate atrial anatomy.  2. OSA Compliant with CPAP  3. Tachycardia mediated CM I think that  it is really important to keep this patient in sinus if at all possible. Ablation is a good idea for him.  4. Sinus bradycardia Minimally symptomatic Heart rates may improve post ablation, especially if we are able to get him off of amiodarone. I would reassess down the road, hoping to avoid pacing at this time.  5. HTN Stable No change required today   COVID screen The patient does not have any symptoms that suggest any further testing/ screening at this time.  Social distancing reinforced today.   Current medicines are reviewed at length with the patient today.   The patient does not have concerns regarding his medicines.  The following changes were made today:  none  Labs/ tests ordered today include:  No orders of the defined types were placed in this encounter.   Patient Risk:  after full review of this patients clinical status, I feel that they are at high risk at this time.   Today, I have spent 20  minutes with the patient with telehealth technology discussing afib and bradycardia .    Signed, Thompson Grayer MD, [facility] 10/25/2018 4:07 PM   [address] [place] [address] [phone] (office) [phone] (fax)

## 2018-10-25 NOTE — Telephone Encounter (Signed)
Cardiac CT vs TEE  Message sent to Marchia Bond

## 2018-10-30 DIAGNOSIS — N210 Calculus in bladder: Secondary | ICD-10-CM | POA: Diagnosis not present

## 2018-10-30 DIAGNOSIS — R351 Nocturia: Secondary | ICD-10-CM | POA: Diagnosis not present

## 2018-10-30 DIAGNOSIS — N401 Enlarged prostate with lower urinary tract symptoms: Secondary | ICD-10-CM | POA: Diagnosis not present

## 2018-10-30 DIAGNOSIS — R310 Gross hematuria: Secondary | ICD-10-CM | POA: Diagnosis not present

## 2018-10-30 NOTE — Telephone Encounter (Signed)
Outreach made to Pt.  Advised ok to go ahead with cardiac CT.  Left days coming up available for ablation.  Will touch base on Wednesday.

## 2018-11-03 NOTE — Telephone Encounter (Signed)
Returned call to Pt to discuss scheduling ablation.  Per Pt he saw his urologist last week and he has kidney stones that need to be surgically removed.  He states this will require him to be off his blood thinner for a few days.  Advised pt if he needs that surgery he would need to do that first before his afib ablation, because after his ablation he will not be able to miss any doses of his blood thinner for at least 3 months.  Pt indicates understanding.  Would like to revisit ablation in December.  Advised Pt this nurse would touch base with him end of November to schedule.  Will need special cardiac CT based on renal function  The cut off for CT contrast is a GFR of 35 or less. His was 39. So he can be scanned. But needs extra IV hydration to reduce risk of contrast induced nephropathy. I can arrange that the day of his scan--also h/o of CHF The IV hydration protocol is 65ml/kg/hr x 1 hr , scan, then 69ml/kg/hr x 2 hrs

## 2018-11-08 ENCOUNTER — Other Ambulatory Visit (HOSPITAL_COMMUNITY): Payer: Self-pay

## 2018-11-08 MED ORDER — ENTRESTO 97-103 MG PO TABS: 1.0000 | ORAL_TABLET | Freq: Two times a day (BID) | ORAL | 3 refills | Status: DC

## 2018-11-09 DIAGNOSIS — N401 Enlarged prostate with lower urinary tract symptoms: Secondary | ICD-10-CM | POA: Diagnosis not present

## 2018-11-09 DIAGNOSIS — R351 Nocturia: Secondary | ICD-10-CM | POA: Diagnosis not present

## 2018-11-14 ENCOUNTER — Other Ambulatory Visit: Payer: Self-pay | Admitting: Urology

## 2018-11-23 DIAGNOSIS — G4733 Obstructive sleep apnea (adult) (pediatric): Secondary | ICD-10-CM | POA: Diagnosis not present

## 2018-12-01 ENCOUNTER — Telehealth (HOSPITAL_COMMUNITY): Payer: Self-pay

## 2018-12-01 NOTE — Telephone Encounter (Signed)
Faxed signed Documents to [facility] @ [phone].

## 2018-12-07 DIAGNOSIS — N281 Cyst of kidney, acquired: Secondary | ICD-10-CM | POA: Diagnosis not present

## 2018-12-07 DIAGNOSIS — I129 Hypertensive chronic kidney disease with stage 1 through stage 4 chronic kidney disease, or unspecified chronic kidney disease: Secondary | ICD-10-CM | POA: Diagnosis not present

## 2018-12-07 DIAGNOSIS — Z23 Encounter for immunization: Secondary | ICD-10-CM | POA: Diagnosis not present

## 2018-12-07 DIAGNOSIS — N183 Chronic kidney disease, stage 3 unspecified: Secondary | ICD-10-CM | POA: Diagnosis not present

## 2018-12-07 DIAGNOSIS — R809 Proteinuria, unspecified: Secondary | ICD-10-CM | POA: Diagnosis not present

## 2018-12-11 DIAGNOSIS — R351 Nocturia: Secondary | ICD-10-CM | POA: Diagnosis not present

## 2018-12-11 DIAGNOSIS — N210 Calculus in bladder: Secondary | ICD-10-CM | POA: Diagnosis not present

## 2018-12-11 DIAGNOSIS — N401 Enlarged prostate with lower urinary tract symptoms: Secondary | ICD-10-CM | POA: Diagnosis not present

## 2018-12-11 DIAGNOSIS — N390 Urinary tract infection, site not specified: Secondary | ICD-10-CM | POA: Diagnosis not present

## 2018-12-11 DIAGNOSIS — B952 Enterococcus as the cause of diseases classified elsewhere: Secondary | ICD-10-CM | POA: Diagnosis not present

## 2018-12-16 DIAGNOSIS — G4733 Obstructive sleep apnea (adult) (pediatric): Secondary | ICD-10-CM | POA: Diagnosis not present

## 2018-12-19 NOTE — Patient Instructions (Addendum)
DUE TO COVID-19 ONLY ONE VISITOR IS ALLOWED TO COME WITH YOU AND STAY IN THE WAITING ROOM ONLY DURING PRE OP AND PROCEDURE DAY OF SURGERY. THE 1 VISITOR MAY VISIT WITH YOU AFTER SURGERY IN YOUR PRIVATE ROOM DURING VISITING HOURS ONLY!  YOU NEED TO HAVE A COVID 19 TEST ON___Friday   11/27/2020____ @__  0910 am_____, THIS TEST MUST BE DONE BEFORE SURGERY, COME  [address] [place] , [id number].  ([place]) ONCE YOUR COVID TEST IS COMPLETED, PLEASE BEGIN THE QUARANTINE INSTRUCTIONS AS OUTLINED IN YOUR HANDOUT.                [name]     Your procedure is scheduled on: Monday 12/25/2018   Report to [facility] Main  Entrance    Report to admitting at 0930  AM     Call this number if you have problems the morning of surgery [phone]    Remember: Do not eat food  :After Midnight on Saturday 12/23/2018 .              You will be following a Bowel prep from Dr. Alyson Ingles and drink clear liquids all day up until midnight!              Follow Bowel prep instructions from Dr. Alyson Ingles- on Sunday 12/24/2018-drink one bottle Magnesium Citrate by noon and drink clear liquids all day up until midnight!    CLEAR LIQUID DIET   Foods Allowed                                                                     Foods Excluded  Coffee and tea, regular and decaf                             liquids that you cannot  Plain Jell-O any favor except red or purple                                           see through such as: Fruit ices (not with fruit pulp)                                     milk, soups, orange juice  Iced Popsicles                                    All solid food Carbonated beverages, regular and diet                                    Cranberry, grape and apple juices Sports drinks like Gatorade Lightly seasoned clear broth or consume(fat free) Sugar, honey syrup  Sample Menu Breakfast  Lunch                                      Supper Cranberry juice                    Beef broth                            Chicken broth Jell-O                                     Grape juice                           Apple juice Coffee or tea                        Jell-O                                      Popsicle                                                Coffee or tea                        Coffee or tea  _____________________________________________________________________      BRUSH YOUR TEETH MORNING OF SURGERY AND RINSE YOUR MOUTH OUT, NO CHEWING GUM CANDY OR MINTS.     Take these medicines the morning of surgery with A SIP OF WATER: Amiodarone (Pacerone), Pantoprazole (Protonix), use Albuterol inhaler if needed , use Advair inhaler if needed and bring inhalers with  you to the hospital.                                 You may not have any metal on your body including hair pins and              piercings  Do not wear jewelry, make-up, lotions, powders or perfumes, deodorant                           Men may shave face and neck.   Do not bring valuables to the hospital. [place].  Contacts, dentures or bridgework may not be worn into surgery.  Leave suitcase in the car. After surgery it may be brought to your room.                  Please read over the following fact sheets you were given: _____________________________________________________________________             [facility] - Preparing for Surgery Before surgery, you can play an important role.  Because skin is not sterile, your skin needs to be as free of germs as possible.  You can reduce the number of germs  on your skin by washing with CHG (chlorahexidine gluconate) soap before surgery.  CHG is an antiseptic cleaner which kills germs and bonds with the skin to continue killing germs even after washing. Please DO NOT use if you have an allergy to CHG or antibacterial soaps.  If your  skin becomes reddened/irritated stop using the CHG and inform your nurse when you arrive at Short Stay. Do not shave (including legs and underarms) for at least 48 hours prior to the first CHG shower.  You may shave your face/neck. Please follow these instructions carefully:  1.  Shower with CHG Soap the night before surgery and the  morning of Surgery.  2.  If you choose to wash your hair, wash your hair first as usual with your  normal  shampoo.  3.  After you shampoo, rinse your hair and body thoroughly to remove the  shampoo.                           4.  Use CHG as you would any other liquid soap.  You can apply chg directly  to the skin and wash                       Gently with a scrungie or clean washcloth.  5.  Apply the CHG Soap to your body ONLY FROM THE NECK DOWN.   Do not use on face/ open                           Wound or open sores. Avoid contact with eyes, ears mouth and genitals (private parts).                       Wash face,  Genitals (private parts) with your normal soap.             6.  Wash thoroughly, paying special attention to the area where your surgery  will be performed.  7.  Thoroughly rinse your body with warm water from the neck down.  8.  DO NOT shower/wash with your normal soap after using and rinsing off  the CHG Soap.                9.  Pat yourself dry with a clean towel.            10.  Wear clean pajamas.            11.  Place clean sheets on your bed the night of your first shower and do not  sleep with pets. Day of Surgery : Do not apply any lotions/deodorants the morning of surgery.  Please wear clean clothes to the hospital/surgery center.  FAILURE TO FOLLOW THESE INSTRUCTIONS MAY RESULT IN THE CANCELLATION OF YOUR SURGERY PATIENT SIGNATURE_________________________________  NURSE SIGNATURE__________________________________  ________________________________________________________________________

## 2018-12-20 ENCOUNTER — Encounter (HOSPITAL_COMMUNITY)
Admission: RE | Admit: 2018-12-20 | Discharge: 2018-12-20 | Disposition: A | Discharge status: Home / Self Care | Payer: BC Managed Care – PPO | Source: Ambulatory Visit | Attending: Urology | Admitting: Urology

## 2018-12-20 ENCOUNTER — Other Ambulatory Visit: Payer: Self-pay

## 2018-12-20 ENCOUNTER — Encounter (HOSPITAL_COMMUNITY): Payer: Self-pay

## 2018-12-20 DIAGNOSIS — Z01812 Encounter for preprocedural laboratory examination: Secondary | ICD-10-CM | POA: Diagnosis not present

## 2018-12-20 DIAGNOSIS — K219 Gastro-esophageal reflux disease without esophagitis: Secondary | ICD-10-CM | POA: Insufficient documentation

## 2018-12-20 DIAGNOSIS — N401 Enlarged prostate with lower urinary tract symptoms: Secondary | ICD-10-CM | POA: Diagnosis not present

## 2018-12-20 DIAGNOSIS — Z7901 Long term (current) use of anticoagulants: Secondary | ICD-10-CM | POA: Diagnosis not present

## 2018-12-20 DIAGNOSIS — G4730 Sleep apnea, unspecified: Secondary | ICD-10-CM | POA: Diagnosis not present

## 2018-12-20 DIAGNOSIS — I509 Heart failure, unspecified: Secondary | ICD-10-CM | POA: Insufficient documentation

## 2018-12-20 DIAGNOSIS — I130 Hypertensive heart and chronic kidney disease with heart failure and stage 1 through stage 4 chronic kidney disease, or unspecified chronic kidney disease: Secondary | ICD-10-CM | POA: Diagnosis not present

## 2018-12-20 DIAGNOSIS — N210 Calculus in bladder: Secondary | ICD-10-CM | POA: Insufficient documentation

## 2018-12-20 DIAGNOSIS — E669 Obesity, unspecified: Secondary | ICD-10-CM | POA: Insufficient documentation

## 2018-12-20 DIAGNOSIS — Z79899 Other long term (current) drug therapy: Secondary | ICD-10-CM | POA: Insufficient documentation

## 2018-12-20 DIAGNOSIS — R3914 Feeling of incomplete bladder emptying: Secondary | ICD-10-CM | POA: Insufficient documentation

## 2018-12-20 DIAGNOSIS — I4891 Unspecified atrial fibrillation: Secondary | ICD-10-CM | POA: Diagnosis not present

## 2018-12-20 DIAGNOSIS — Z6829 Body mass index (BMI) 29.0-29.9, adult: Secondary | ICD-10-CM | POA: Insufficient documentation

## 2018-12-20 HISTORY — DX: Cardiac arrhythmia, unspecified: I49.9

## 2018-12-20 LAB — BASIC METABOLIC PANEL
Anion gap: 7 (ref 5–15)
BUN: 22 mg/dL (ref 8–23)
CO2: 24 mmol/L (ref 22–32)
Calcium: 8.7 mg/dL — ABNORMAL LOW (ref 8.9–10.3)
Chloride: 108 mmol/L (ref 98–111)
Creatinine, Ser: 2.09 mg/dL — ABNORMAL HIGH (ref 0.61–1.24)
GFR calc Af Amer: 38 mL/min — ABNORMAL LOW (ref >60)
GFR calc non Af Amer: 33 mL/min — ABNORMAL LOW (ref >60)
Glucose, Bld: 82 mg/dL (ref 70–99)
Potassium: 3.9 mmol/L (ref 3.5–5.1)
Sodium: 139 mmol/L (ref 135–145)

## 2018-12-20 LAB — CBC
HCT: 37.2 % — ABNORMAL LOW (ref 39.0–52.0)
Hemoglobin: 11.8 g/dL — ABNORMAL LOW (ref 13.0–17.0)
MCH: 28.4 pg (ref 26.0–34.0)
MCHC: 31.7 g/dL (ref 30.0–36.0)
MCV: 89.4 fL (ref 80.0–100.0)
Platelets: 261 10*3/uL (ref 150–400)
RBC: 4.16 MIL/uL — ABNORMAL LOW (ref 4.22–5.81)
RDW: 13.4 % (ref 11.5–15.5)
WBC: 5.6 10*3/uL (ref 4.0–10.5)
nRBC: 0 % (ref 0.0–0.2)

## 2018-12-20 MED ORDER — MAGNESIUM CITRATE PO SOLN
1.0000 | Freq: Once | ORAL | Status: DC
  Filled 2018-12-20: qty 296

## 2018-12-20 NOTE — Progress Notes (Signed)
Called and LM on VM for Selita to Fax over any Cardiac Clearance for patient for surgery on 12/25/2018.

## 2018-12-20 NOTE — Progress Notes (Addendum)
PCP - Dr. [name] Cardiologist - CHF-Dr. Bensihmon  LOV 10/16/2018 Epic Dr. Rayann Heman- 10/25/2018-telemedicine visit  Talks to patient about having Cardiac Ablation for Atrial Fibrillation  Chest x-ray - none EKG - 10/16/2018 EPIC Stress Test - none ECHO - 08/01/2018 EPIC Cardiac Cath -   Sleep Study - Hx Sleep Apnea CPAP - No  Fasting Blood Sugar - N/A Checks Blood Sugar __0___ times a day  Blood Thinner Instructions:Eliquis Aspirin Instructions:N/A Last Dose:Patient instructed to Take the last dose of Eliquis on Friday night 11/27/2020wants him to be off Eliquis 48 hours prior to surgery.  Anesthesia review:  Chart given to Konrad Felix, PA  Patient has a history of Systolic Heart Failure, HTN, Atrial Fibrillation, asthma, and Sleep apnea.  Patient denies shortness of breath, fever, cough and chest pain at PAT appointment   Patient verbalized understanding of instructions that were given to them at the PAT appointment. Patient was also instructed that they will need to review over the PAT instructions again at home before surgery.

## 2018-12-22 ENCOUNTER — Other Ambulatory Visit (HOSPITAL_COMMUNITY)
Admission: RE | Admit: 2018-12-22 | Discharge: 2018-12-22 | Disposition: A | Discharge status: Home / Self Care | Payer: BC Managed Care – PPO | Source: Ambulatory Visit | Attending: Urology | Admitting: Urology

## 2018-12-22 DIAGNOSIS — Z01812 Encounter for preprocedural laboratory examination: Secondary | ICD-10-CM | POA: Diagnosis not present

## 2018-12-22 DIAGNOSIS — Z20828 Contact with and (suspected) exposure to other viral communicable diseases: Secondary | ICD-10-CM | POA: Diagnosis not present

## 2018-12-22 LAB — SARS CORONAVIRUS 2 (TAT 6-24 HRS): SARS Coronavirus 2: NEGATIVE

## 2018-12-22 NOTE — Anesthesia Preprocedure Evaluation (Addendum)
Anesthesia Evaluation  Patient identified by MRN, date of birth, ID band Patient awake    Reviewed: Allergy & Precautions, NPO status , Patient's Chart, lab work & pertinent test results  History of Anesthesia Complications Negative for: history of anesthetic complications  Airway Mallampati: II  TM Distance: >3 FB Neck ROM: Full    Dental no notable dental hx.    Pulmonary asthma , sleep apnea and Continuous Positive Airway Pressure Ventilation ,    Pulmonary exam normal        Cardiovascular hypertension, Pt. on medications +CHF  Normal cardiovascular exam+ dysrhythmias (on Eliquis) Atrial Fibrillation   EKG 10/16/2018: SB, RBBB  Echo 08/01/2018: EF 50%, LV diffuse hypokinesis, mild LAE, mild RAE   Neuro/Psych negative neurological ROS  negative psych ROS   GI/Hepatic Neg liver ROS, GERD  Medicated and Controlled,  Endo/Other  negative endocrine ROS  Renal/GU Renal InsufficiencyRenal disease  negative genitourinary   Musculoskeletal negative musculoskeletal ROS (+)   Abdominal   Peds  Hematology negative hematology ROS (+)   Anesthesia Other Findings Day of surgery medications reviewed with patient.  Reproductive/Obstetrics negative OB ROS                           Anesthesia Physical Anesthesia Plan  ASA: III  Anesthesia Plan: General   Post-op Pain Management:    Induction: Intravenous  PONV Risk Score and Plan: 3 and Treatment may vary due to age or medical condition, Ondansetron, Dexamethasone and Midazolam  Airway Management Planned: Oral ETT  Additional Equipment:   Intra-op Plan:   Post-operative Plan: Extubation in OR  Informed Consent: I have reviewed the patients History and Physical, chart, labs and discussed the procedure including the risks, benefits and alternatives for the proposed anesthesia with the patient or authorized representative who has indicated  his/her understanding and acceptance.     Dental advisory given  Plan Discussed with: CRNA  Anesthesia Plan Comments: (See PAT note 12/20/2018, Konrad Felix, PA-C)       Anesthesia Quick Evaluation

## 2018-12-22 NOTE — Progress Notes (Signed)
Anesthesia Chart Review   Case: [id number] Date/Time: 12/25/18 1100   Procedures:      XI ROBOTIC ASSISTED SIMPLE PROSTATECTOMY (N/A )     CYSTOSCOPY WITH LITHOLAPAXY (N/A )     HOLMIUM LASER APPLICATION (N/A )   Anesthesia type: General   Pre-op diagnosis: BENIGN PROSTATIC HYPERPLASIA W/ INCOMPLETE EMPTYING, BLADDER CALCULI   Location: [facility] ROOM 03 / [facility]   Surgeon: Cleon Gustin, MD      DISCUSSION:63 y.o. never smoker with h/o HTN, GERD, CHF, atrial fibrillation (on Eliquis), sleep apnea w/CPAP, CKD Stage III-IV (stable), BPH with incomplete emptying, bladder calculi scheduled for above procedure 12/25/2018 with Dr. Nicolette Bang.   Pt reports he was advised to hold Eliquis 48hrs prior to procedure with last dose 12/22/2018.   Pt seen by Dr. Thompson Grayer 10/25/2018.  Ablation for sytmptomatic recurrent persistent atrial fibrillation discussed at this visit with plans to procedure after urologic procedure.    Pt last seen by cardiologist, Dr. Glori Bickers, 10/16/2018.  Stable at this visit.  Echo 07/2018 with EF 50-55%.   Anticipate pt can proceed with planned procedure barring acute status change.   VS: BP 131/68   Pulse (!) 53   Temp 37.1 C (Oral)   Resp 18   Ht 6' (1.829 m)   Wt 99.6 kg   SpO2 97%   BMI 29.78 kg/m   PROVIDERS: [name], MD is PCP   Glori Bickers, MD is Cardiologist   Thompson Grayer, MD is Electrophysiologist  LABS: Labs reviewed: Acceptable for surgery. (all labs ordered are listed, but only abnormal results are displayed)  Labs Reviewed  BASIC METABOLIC PANEL - Abnormal; Notable for the following components:      Result Value   Creatinine, Ser 2.09 (*)    Calcium 8.7 (*)    GFR calc non Af Amer 33 (*)    GFR calc Af Amer 38 (*)    All other components within normal limits  CBC - Abnormal; Notable for the following components:   RBC 4.16 (*)    Hemoglobin 11.8 (*)    HCT 37.2 (*)    All other components within normal limits      IMAGES:   EKG: 10/16/2018 Rate 45 bpm Sinus bradycardia Right bundle branch block   CV: Echo 08/01/2018 IMPRESSIONS   1. The left ventricle has a visually estimated ejection fraction of 50%. The cavity size was normal. Left ventricular diastolic Doppler parameters are consistent with impaired relaxation. Left ventricular diffuse hypokinesis.  2. The right ventricle has normal systolic function. The cavity was normal. There is no increase in right ventricular wall thickness.  3. Left atrial size was mildly dilated.  4. Right atrial size was mildly dilated.  5. No evidence of mitral valve stenosis. Trivial mitral regurgitation.  6. The aortic valve is tricuspid. Aortic valve regurgitation is trivial by color flow Doppler. No stenosis of the aortic valve.  7. The aortic root is normal in size and structure.  8. The inferior vena cava was dilated in size with >50% respiratory variability. PA systolic pressure 32 mmHg. Past Medical History:  Diagnosis Date  . Asthma, persistent   . BPH (benign prostatic hyperplasia) 2014  . CHF (congestive heart failure) ([place])   . Combined hyperlipidemia   . Dysrhythmia    ATRIAL FIBRILLATION  . GERD (gastroesophageal reflux disease)   . History of kidney stones   . Hypertension   . 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  Obesity   . Persistent atrial fibrillation ([place])   . Renal stone 04/2012  . Seasonal allergic rhinitis   . Sleep apnea    does not use a cpap machine    Past Surgical History:  Procedure Laterality Date  . CARDIOVERSION N/A 04/23/2016   Procedure: CARDIOVERSION;  Surgeon: Jolaine Artist, MD;  Location: [facility] ENDOSCOPY;  Service: Cardiovascular;  Laterality: N/A;  . CYSTOSCOPY WITH RETROGRADE PYELOGRAM, URETEROSCOPY AND STENT PLACEMENT Left 07/29/2017   Procedure: CYSTOSCOPY WITH RETROGRADE PYELOGRAM AND LEFT STENT PLACEMENT;  Surgeon: Franchot Gallo, MD;  Location: [facility];  Service: Urology;  Laterality: Left;   . EXTRACORPOREAL SHOCK WAVE LITHOTRIPSY Left 08/08/2017   Procedure: LEFT EXTRACORPOREAL SHOCK WAVE LITHOTRIPSY (ESWL);  Surgeon: Nickie Retort, MD;  Location: [facility];  Service: Urology;  Laterality: Left;  . TEE WITHOUT CARDIOVERSION N/A 04/23/2016   Procedure: TRANSESOPHAGEAL ECHOCARDIOGRAM (TEE);  Surgeon: Jolaine Artist, MD;  Location: [facility] ENDOSCOPY;  Service: Cardiovascular;  Laterality: N/A;    MEDICATIONS: . acetaminophen (TYLENOL) 325 MG tablet  . albuterol (PROVENTIL HFA;VENTOLIN HFA) 108 (90 Base) MCG/ACT inhaler  . amiodarone (PACERONE) 400 MG tablet  . apixaban (ELIQUIS) 5 MG TABS tablet  . Fluticasone-Salmeterol (ADVAIR) 250-50 MCG/DOSE AEPB  . furosemide (LASIX) 40 MG tablet  . nitrofurantoin (MACRODANTIN) 100 MG capsule  . pantoprazole (PROTONIX) 40 MG tablet  . potassium chloride (K-DUR) 10 MEQ tablet  . sacubitril-valsartan (ENTRESTO) 97-103 MG  . tadalafil (CIALIS) 5 MG tablet  . tamsulosin (FLOMAX) 0.4 MG CAPS capsule   No current facility-administered medications for this encounter.     Maia Plan [facility] Pre-Surgical Testing [phone] 12/22/18  1:25 PM

## 2018-12-25 ENCOUNTER — Encounter (HOSPITAL_COMMUNITY)
Admission: RE | Disposition: A | Discharge status: Home / Self Care | Payer: Self-pay | Source: Home / Self Care | Attending: Urology

## 2018-12-25 ENCOUNTER — Inpatient Hospital Stay (HOSPITAL_COMMUNITY): Payer: BC Managed Care – PPO | Admitting: Anesthesiology

## 2018-12-25 ENCOUNTER — Inpatient Hospital Stay (HOSPITAL_COMMUNITY)
Admission: RE | Admit: 2018-12-25 | Discharge: 2018-12-27 | DRG: 717 | Disposition: A | Discharge status: Home / Self Care | Payer: BC Managed Care – PPO | Attending: Urology | Admitting: Urology

## 2018-12-25 ENCOUNTER — Encounter (HOSPITAL_COMMUNITY): Payer: Self-pay

## 2018-12-25 ENCOUNTER — Inpatient Hospital Stay (HOSPITAL_COMMUNITY): Payer: BC Managed Care – PPO | Admitting: Physician Assistant

## 2018-12-25 ENCOUNTER — Other Ambulatory Visit: Payer: Self-pay

## 2018-12-25 DIAGNOSIS — G4730 Sleep apnea, unspecified: Secondary | ICD-10-CM | POA: Diagnosis not present

## 2018-12-25 DIAGNOSIS — I110 Hypertensive heart disease with heart failure: Secondary | ICD-10-CM | POA: Diagnosis not present

## 2018-12-25 DIAGNOSIS — N210 Calculus in bladder: Secondary | ICD-10-CM | POA: Diagnosis present

## 2018-12-25 DIAGNOSIS — R3915 Urgency of urination: Secondary | ICD-10-CM | POA: Diagnosis present

## 2018-12-25 DIAGNOSIS — I509 Heart failure, unspecified: Secondary | ICD-10-CM | POA: Diagnosis present

## 2018-12-25 DIAGNOSIS — I4510 Unspecified right bundle-branch block: Secondary | ICD-10-CM | POA: Diagnosis not present

## 2018-12-25 DIAGNOSIS — Z20828 Contact with and (suspected) exposure to other viral communicable diseases: Secondary | ICD-10-CM | POA: Diagnosis present

## 2018-12-25 DIAGNOSIS — Z79899 Other long term (current) drug therapy: Secondary | ICD-10-CM | POA: Diagnosis not present

## 2018-12-25 DIAGNOSIS — Z87442 Personal history of urinary calculi: Secondary | ICD-10-CM | POA: Diagnosis not present

## 2018-12-25 DIAGNOSIS — N138 Other obstructive and reflux uropathy: Secondary | ICD-10-CM | POA: Diagnosis not present

## 2018-12-25 DIAGNOSIS — E782 Mixed hyperlipidemia: Secondary | ICD-10-CM | POA: Diagnosis not present

## 2018-12-25 DIAGNOSIS — K219 Gastro-esophageal reflux disease without esophagitis: Secondary | ICD-10-CM | POA: Diagnosis not present

## 2018-12-25 DIAGNOSIS — Z888 Allergy status to other drugs, medicaments and biological substances status: Secondary | ICD-10-CM | POA: Diagnosis not present

## 2018-12-25 DIAGNOSIS — Z8249 Family history of ischemic heart disease and other diseases of the circulatory system: Secondary | ICD-10-CM

## 2018-12-25 DIAGNOSIS — I4819 Other persistent atrial fibrillation: Secondary | ICD-10-CM | POA: Diagnosis present

## 2018-12-25 DIAGNOSIS — N401 Enlarged prostate with lower urinary tract symptoms: Secondary | ICD-10-CM | POA: Diagnosis present

## 2018-12-25 DIAGNOSIS — R3914 Feeling of incomplete bladder emptying: Secondary | ICD-10-CM | POA: Diagnosis not present

## 2018-12-25 HISTORY — PX: XI ROBOTIC ASSISTED SIMPLE PROSTATECTOMY: SHX6713

## 2018-12-25 LAB — HEMOGLOBIN AND HEMATOCRIT, BLOOD
HCT: 36.7 % — ABNORMAL LOW (ref 39.0–52.0)
Hemoglobin: 11.5 g/dL — ABNORMAL LOW (ref 13.0–17.0)

## 2018-12-25 SURGERY — PROSTATECTOMY, SIMPLE, ROBOT-ASSISTED
Anesthesia: General

## 2018-12-25 MED ORDER — POTASSIUM CHLORIDE CRYS ER 20 MEQ PO TBCR
30.0000 meq | EXTENDED_RELEASE_TABLET | Freq: Every morning | ORAL | Status: DC
  Administered 2018-12-26 – 2018-12-27 (×2): 30 meq via ORAL
  Filled 2018-12-25 (×2): qty 1

## 2018-12-25 MED ORDER — ROCURONIUM BROMIDE 10 MG/ML (PF) SYRINGE
PREFILLED_SYRINGE | INTRAVENOUS | Status: DC | PRN
  Administered 2018-12-25: 60 mg via INTRAVENOUS
  Administered 2018-12-25 (×2): 30 mg via INTRAVENOUS
  Administered 2018-12-25: 20 mg via INTRAVENOUS

## 2018-12-25 MED ORDER — OXYCODONE HCL 5 MG/5ML PO SOLN: 5.0000 mg | Freq: Once | ORAL | Status: DC | PRN

## 2018-12-25 MED ORDER — ROCURONIUM BROMIDE 10 MG/ML (PF) SYRINGE
PREFILLED_SYRINGE | INTRAVENOUS | Status: AC
  Filled 2018-12-25: qty 10

## 2018-12-25 MED ORDER — PROMETHAZINE HCL 25 MG/ML IJ SOLN: 6.2500 mg | INTRAMUSCULAR | Status: DC | PRN

## 2018-12-25 MED ORDER — POTASSIUM CHLORIDE CRYS ER 20 MEQ PO TBCR
20.0000 meq | EXTENDED_RELEASE_TABLET | Freq: Every evening | ORAL | Status: DC
  Administered 2018-12-25 – 2018-12-26 (×2): 20 meq via ORAL
  Filled 2018-12-25 (×2): qty 1

## 2018-12-25 MED ORDER — OXYCODONE HCL 5 MG PO TABS
5.0000 mg | ORAL_TABLET | ORAL | Status: DC | PRN
  Administered 2018-12-25 – 2018-12-27 (×7): 5 mg via ORAL
  Filled 2018-12-25 (×8): qty 1

## 2018-12-25 MED ORDER — PANTOPRAZOLE SODIUM 40 MG PO TBEC
40.0000 mg | DELAYED_RELEASE_TABLET | Freq: Every day | ORAL | Status: DC
  Administered 2018-12-26 – 2018-12-27 (×2): 40 mg via ORAL
  Filled 2018-12-25 (×2): qty 1

## 2018-12-25 MED ORDER — GLYCOPYRROLATE PF 0.2 MG/ML IJ SOSY
PREFILLED_SYRINGE | INTRAMUSCULAR | Status: DC | PRN
  Administered 2018-12-25: .2 mg via INTRAVENOUS

## 2018-12-25 MED ORDER — SACUBITRIL-VALSARTAN 97-103 MG PO TABS
1.0000 | ORAL_TABLET | Freq: Two times a day (BID) | ORAL | Status: DC
  Administered 2018-12-26 – 2018-12-27 (×3): 1 via ORAL
  Filled 2018-12-25 (×4): qty 1

## 2018-12-25 MED ORDER — SODIUM CHLORIDE 0.9 % IV SOLN
2.0000 g | INTRAVENOUS | Status: AC
  Administered 2018-12-25: 2 g via INTRAVENOUS
  Filled 2018-12-25: qty 20

## 2018-12-25 MED ORDER — MOMETASONE FURO-FORMOTEROL FUM 200-5 MCG/ACT IN AERO
2.0000 | INHALATION_SPRAY | Freq: Two times a day (BID) | RESPIRATORY_TRACT | Status: DC
  Filled 2018-12-25: qty 8.8

## 2018-12-25 MED ORDER — EPHEDRINE 5 MG/ML INJ
INTRAVENOUS | Status: AC
  Filled 2018-12-25: qty 10

## 2018-12-25 MED ORDER — PHENYLEPHRINE 40 MCG/ML (10ML) SYRINGE FOR IV PUSH (FOR BLOOD PRESSURE SUPPORT)
PREFILLED_SYRINGE | INTRAVENOUS | Status: AC
  Filled 2018-12-25: qty 10

## 2018-12-25 MED ORDER — SODIUM CHLORIDE 0.9 % IR SOLN
3000.0000 mL | Status: DC
  Administered 2018-12-25: 3000 mL

## 2018-12-25 MED ORDER — DOCUSATE SODIUM 100 MG PO CAPS
100.0000 mg | ORAL_CAPSULE | Freq: Two times a day (BID) | ORAL | Status: DC
  Administered 2018-12-25 – 2018-12-27 (×4): 100 mg via ORAL
  Filled 2018-12-25 (×4): qty 1

## 2018-12-25 MED ORDER — BELLADONNA ALKALOIDS-OPIUM 16.2-60 MG RE SUPP: 1.0000 | Freq: Four times a day (QID) | RECTAL | Status: DC | PRN

## 2018-12-25 MED ORDER — DEXAMETHASONE SODIUM PHOSPHATE 10 MG/ML IJ SOLN
INTRAMUSCULAR | Status: AC
  Filled 2018-12-25: qty 1

## 2018-12-25 MED ORDER — GLYCOPYRROLATE PF 0.2 MG/ML IJ SOSY
PREFILLED_SYRINGE | INTRAMUSCULAR | Status: AC
  Filled 2018-12-25: qty 1

## 2018-12-25 MED ORDER — LIDOCAINE 2% (20 MG/ML) 5 ML SYRINGE
INTRAMUSCULAR | Status: AC
  Filled 2018-12-25: qty 5

## 2018-12-25 MED ORDER — TAMSULOSIN HCL 0.4 MG PO CAPS
0.8000 mg | ORAL_CAPSULE | Freq: Every day | ORAL | Status: DC
  Administered 2018-12-25: 0.8 mg via ORAL
  Filled 2018-12-25 (×2): qty 2

## 2018-12-25 MED ORDER — DIPHENHYDRAMINE HCL 50 MG/ML IJ SOLN: 12.5000 mg | Freq: Four times a day (QID) | INTRAMUSCULAR | Status: DC | PRN

## 2018-12-25 MED ORDER — HYDROMORPHONE HCL 1 MG/ML IJ SOLN
0.5000 mg | INTRAMUSCULAR | Status: DC | PRN
  Administered 2018-12-26 (×3): 1 mg via INTRAVENOUS
  Filled 2018-12-25 (×3): qty 1

## 2018-12-25 MED ORDER — ONDANSETRON HCL 4 MG/2ML IJ SOLN: 4.0000 mg | INTRAMUSCULAR | Status: DC | PRN

## 2018-12-25 MED ORDER — PHENYLEPHRINE HCL-NACL 10-0.9 MG/250ML-% IV SOLN
INTRAVENOUS | Status: DC | PRN
  Administered 2018-12-25: 10 ug/min via INTRAVENOUS

## 2018-12-25 MED ORDER — LIDOCAINE 2% (20 MG/ML) 5 ML SYRINGE
INTRAMUSCULAR | Status: DC | PRN
  Administered 2018-12-25: 60 mg via INTRAVENOUS
  Administered 2018-12-25: 40 mg via INTRAVENOUS

## 2018-12-25 MED ORDER — ALBUTEROL SULFATE (2.5 MG/3ML) 0.083% IN NEBU: 3.0000 mL | INHALATION_SOLUTION | Freq: Four times a day (QID) | RESPIRATORY_TRACT | Status: DC | PRN

## 2018-12-25 MED ORDER — ONDANSETRON HCL 4 MG/2ML IJ SOLN
INTRAMUSCULAR | Status: AC
  Filled 2018-12-25: qty 2

## 2018-12-25 MED ORDER — ACETAMINOPHEN 500 MG PO TABS
1000.0000 mg | ORAL_TABLET | Freq: Once | ORAL | Status: AC
  Administered 2018-12-25: 1000 mg via ORAL
  Filled 2018-12-25: qty 2

## 2018-12-25 MED ORDER — SODIUM CHLORIDE (PF) 0.9 % IJ SOLN
INTRAMUSCULAR | Status: AC
  Filled 2018-12-25: qty 20

## 2018-12-25 MED ORDER — EPHEDRINE SULFATE-NACL 50-0.9 MG/10ML-% IV SOSY
PREFILLED_SYRINGE | INTRAVENOUS | Status: DC | PRN
  Administered 2018-12-25 (×2): 10 mg via INTRAVENOUS
  Administered 2018-12-25 (×2): 5 mg via INTRAVENOUS

## 2018-12-25 MED ORDER — FENTANYL CITRATE (PF) 100 MCG/2ML IJ SOLN
INTRAMUSCULAR | Status: DC | PRN
  Administered 2018-12-25 (×4): 50 ug via INTRAVENOUS

## 2018-12-25 MED ORDER — AMIODARONE HCL 200 MG PO TABS
200.0000 mg | ORAL_TABLET | Freq: Every day | ORAL | Status: DC
  Administered 2018-12-26 – 2018-12-27 (×2): 200 mg via ORAL
  Filled 2018-12-25 (×2): qty 1

## 2018-12-25 MED ORDER — HYDROCODONE-ACETAMINOPHEN 5-325 MG PO TABS: 1.0000 | ORAL_TABLET | Freq: Four times a day (QID) | ORAL | 0 refills | Status: DC | PRN

## 2018-12-25 MED ORDER — POTASSIUM CHLORIDE CRYS ER 20 MEQ PO TBCR: 20.0000 meq | EXTENDED_RELEASE_TABLET | ORAL | Status: DC

## 2018-12-25 MED ORDER — BUPIVACAINE LIPOSOME 1.3 % IJ SUSP
20.0000 mL | Freq: Once | INTRAMUSCULAR | Status: AC
  Administered 2018-12-25: 20 mL
  Filled 2018-12-25: qty 20

## 2018-12-25 MED ORDER — CHLORHEXIDINE GLUCONATE CLOTH 2 % EX PADS
6.0000 | MEDICATED_PAD | Freq: Every day | CUTANEOUS | Status: DC
  Administered 2018-12-26 – 2018-12-27 (×2): 6 via TOPICAL

## 2018-12-25 MED ORDER — OXYCODONE HCL 5 MG PO TABS: 5.0000 mg | ORAL_TABLET | Freq: Once | ORAL | Status: DC | PRN

## 2018-12-25 MED ORDER — LACTATED RINGERS IV SOLN
INTRAVENOUS | Status: DC
  Administered 2018-12-25 (×2): via INTRAVENOUS

## 2018-12-25 MED ORDER — ONDANSETRON HCL 4 MG/2ML IJ SOLN
INTRAMUSCULAR | Status: DC | PRN
  Administered 2018-12-25: 4 mg via INTRAVENOUS

## 2018-12-25 MED ORDER — DEXTROSE-NACL 5-0.45 % IV SOLN
INTRAVENOUS | Status: DC
  Administered 2018-12-25 – 2018-12-26 (×2): via INTRAVENOUS

## 2018-12-25 MED ORDER — DEXAMETHASONE SODIUM PHOSPHATE 10 MG/ML IJ SOLN
INTRAMUSCULAR | Status: DC | PRN
  Administered 2018-12-25: 10 mg via INTRAVENOUS

## 2018-12-25 MED ORDER — SULFAMETHOXAZOLE-TRIMETHOPRIM 800-160 MG PO TABS: 1.0000 | ORAL_TABLET | Freq: Two times a day (BID) | ORAL | 0 refills | Status: DC

## 2018-12-25 MED ORDER — DIPHENHYDRAMINE HCL 12.5 MG/5ML PO ELIX: 12.5000 mg | ORAL_SOLUTION | Freq: Four times a day (QID) | ORAL | Status: DC | PRN

## 2018-12-25 MED ORDER — HYDROMORPHONE HCL 1 MG/ML IJ SOLN: 0.2500 mg | INTRAMUSCULAR | Status: DC | PRN

## 2018-12-25 MED ORDER — FENTANYL CITRATE (PF) 100 MCG/2ML IJ SOLN
INTRAMUSCULAR | Status: AC
  Filled 2018-12-25: qty 2

## 2018-12-25 MED ORDER — MIDAZOLAM HCL 2 MG/2ML IJ SOLN
INTRAMUSCULAR | Status: AC
  Filled 2018-12-25: qty 2

## 2018-12-25 MED ORDER — PROPOFOL 10 MG/ML IV BOLUS
INTRAVENOUS | Status: DC | PRN
  Administered 2018-12-25: 170 mg via INTRAVENOUS
  Administered 2018-12-25: 30 mg via INTRAVENOUS

## 2018-12-25 MED ORDER — LACTATED RINGERS IR SOLN
Status: DC | PRN
  Administered 2018-12-25: 1000 mL

## 2018-12-25 MED ORDER — BACITRACIN-NEOMYCIN-POLYMYXIN 400-5-5000 EX OINT: 1.0000 "application " | TOPICAL_OINTMENT | Freq: Three times a day (TID) | CUTANEOUS | Status: DC | PRN

## 2018-12-25 MED ORDER — FUROSEMIDE 40 MG PO TABS
40.0000 mg | ORAL_TABLET | Freq: Every day | ORAL | Status: DC
  Filled 2018-12-25 (×2): qty 1

## 2018-12-25 MED ORDER — SODIUM CHLORIDE (PF) 0.9 % IJ SOLN
INTRAMUSCULAR | Status: DC | PRN
  Administered 2018-12-25: 20 mL

## 2018-12-25 MED ORDER — STERILE WATER FOR IRRIGATION IR SOLN
Status: DC | PRN
  Administered 2018-12-25: 1000 mL

## 2018-12-25 MED ORDER — MIDAZOLAM HCL 5 MG/5ML IJ SOLN
INTRAMUSCULAR | Status: DC | PRN
  Administered 2018-12-25: 2 mg via INTRAVENOUS

## 2018-12-25 MED ORDER — ACETAMINOPHEN 500 MG PO TABS
1000.0000 mg | ORAL_TABLET | Freq: Four times a day (QID) | ORAL | Status: AC
  Administered 2018-12-25 – 2018-12-26 (×4): 1000 mg via ORAL
  Filled 2018-12-25 (×4): qty 2

## 2018-12-25 MED ORDER — SUGAMMADEX SODIUM 500 MG/5ML IV SOLN
INTRAVENOUS | Status: DC | PRN
  Administered 2018-12-25: 300 mg via INTRAVENOUS

## 2018-12-25 SURGICAL SUPPLY — 81 items
ADH SKN CLS APL DERMABOND .7 (GAUZE/BANDAGES/DRESSINGS) ×1
APL PRP STRL LF DISP 70% ISPRP (MISCELLANEOUS) ×1
APL SWBSTK 6 STRL LF DISP (MISCELLANEOUS) ×1
APPLICATOR COTTON TIP 6 STRL (MISCELLANEOUS) ×1 IMPLANT
APPLICATOR COTTON TIP 6IN STRL (MISCELLANEOUS) ×2
BAG URO CATCHER STRL LF (MISCELLANEOUS) ×1 IMPLANT
CATH FOLEY 2WAY SLVR  5CC 18FR (CATHETERS) ×1
CATH FOLEY 2WAY SLVR 5CC 18FR (CATHETERS) ×1 IMPLANT
CATH FOLEY 3WAY 30CC 22FR (CATHETERS) ×2 IMPLANT
CHLORAPREP W/TINT 26 (MISCELLANEOUS) ×2 IMPLANT
CLOTH BEACON ORANGE TIMEOUT ST (SAFETY) IMPLANT
COVER SURGICAL LIGHT HANDLE (MISCELLANEOUS) ×2 IMPLANT
COVER TIP SHEARS 8 DVNC (MISCELLANEOUS) ×1 IMPLANT
COVER TIP SHEARS 8MM DA VINCI (MISCELLANEOUS) ×1
COVER WAND RF STERILE (DRAPES) IMPLANT
DECANTER SPIKE VIAL GLASS SM (MISCELLANEOUS) ×2 IMPLANT
DERMABOND ADVANCED (GAUZE/BANDAGES/DRESSINGS) ×1
DERMABOND ADVANCED .7 DNX12 (GAUZE/BANDAGES/DRESSINGS) ×1 IMPLANT
DRAIN CHANNEL RND F F (WOUND CARE) ×1 IMPLANT
DRAPE ARM DVNC X/XI (DISPOSABLE) ×4 IMPLANT
DRAPE COLUMN DVNC XI (DISPOSABLE) ×1 IMPLANT
DRAPE DA VINCI XI ARM (DISPOSABLE) ×4
DRAPE DA VINCI XI COLUMN (DISPOSABLE) ×1
DRAPE SURG IRRIG POUCH 19X23 (DRAPES) ×2 IMPLANT
DRSG TEGADERM 4X4.75 (GAUZE/BANDAGES/DRESSINGS) ×2 IMPLANT
ELECT REM PT RETURN 15FT ADLT (MISCELLANEOUS) ×2 IMPLANT
FIBER LASER FLEXIVA 1000 (UROLOGICAL SUPPLIES) IMPLANT
FIBER LASER FLEXIVA 365 (UROLOGICAL SUPPLIES) IMPLANT
FIBER LASER FLEXIVA 550 (UROLOGICAL SUPPLIES) ×1 IMPLANT
FIBER LASER TRAC TIP (UROLOGICAL SUPPLIES) IMPLANT
GAUZE SPONGE 2X2 8PLY STRL LF (GAUZE/BANDAGES/DRESSINGS) ×1 IMPLANT
GLOVE BIO SURGEON STRL SZ 6.5 (GLOVE) ×2 IMPLANT
GLOVE BIO SURGEON STRL SZ8 (GLOVE) ×4 IMPLANT
GLOVE BIOGEL M 8.0 STRL (GLOVE) ×2 IMPLANT
GLOVE BIOGEL PI IND STRL 8 (GLOVE) ×2 IMPLANT
GLOVE BIOGEL PI INDICATOR 8 (GLOVE) ×2
GOWN STRL REUS W/TWL LRG LVL3 (GOWN DISPOSABLE) ×6 IMPLANT
GOWN STRL REUS W/TWL XL LVL3 (GOWN DISPOSABLE) ×2 IMPLANT
HOLDER FOLEY CATH W/STRAP (MISCELLANEOUS) ×2 IMPLANT
IRRIG SUCT STRYKERFLOW 2 WTIP (MISCELLANEOUS) ×2
IRRIGATION SUCT STRKRFLW 2 WTP (MISCELLANEOUS) ×1 IMPLANT
IV LACTATED RINGERS 1000ML (IV SOLUTION) ×2 IMPLANT
KIT PROBE 340X3.4XDISP GRN (MISCELLANEOUS) IMPLANT
KIT PROBE TRILOGY 3.4X340 (MISCELLANEOUS)
KIT PROBE TRILOGY 3.9X350 (MISCELLANEOUS) IMPLANT
KIT TURNOVER KIT A (KITS) IMPLANT
MANIFOLD NEPTUNE II (INSTRUMENTS) ×2 IMPLANT
NDL INSUFFLATION 14GA 120MM (NEEDLE) ×1 IMPLANT
NEEDLE INSUFFLATION 14GA 120MM (NEEDLE) ×2 IMPLANT
PACK CYSTO (CUSTOM PROCEDURE TRAY) ×1 IMPLANT
PACK ROBOT UROLOGY CUSTOM (CUSTOM PROCEDURE TRAY) ×2 IMPLANT
PAD POSITIONING PINK XL (MISCELLANEOUS) ×2 IMPLANT
PENCIL SMOKE EVACUATOR (MISCELLANEOUS) IMPLANT
SEAL CANN UNIV 5-8 DVNC XI (MISCELLANEOUS) ×4 IMPLANT
SEAL XI 5MM-8MM UNIVERSAL (MISCELLANEOUS) ×4
SET IRRIG Y TYPE TUR BLADDER L (SET/KITS/TRAYS/PACK) IMPLANT
SET TUBE SMOKE EVAC HIGH FLOW (TUBING) ×2 IMPLANT
SOLUTION ELECTROLUBE (MISCELLANEOUS) ×2 IMPLANT
SPONGE GAUZE 2X2 STER 10/PKG (GAUZE/BANDAGES/DRESSINGS) ×1
SPONGE LAP 4X18 RFD (DISPOSABLE) IMPLANT
SUT ETHILON 3 0 PS 1 (SUTURE) ×2 IMPLANT
SUT MNCRL AB 4-0 PS2 18 (SUTURE) ×4 IMPLANT
SUT PDS AB 1 CT1 27 (SUTURE) ×2 IMPLANT
SUT V-LOC BARB 180 2/0GR6 GS22 (SUTURE) ×4
SUT VIC AB 0 CT1 27 (SUTURE) ×10
SUT VIC AB 0 CT1 27XBRD ANTBC (SUTURE) ×5 IMPLANT
SUT VIC AB 0 UR5 27 (SUTURE) ×1 IMPLANT
SUT VIC AB 2-0 SH 27 (SUTURE)
SUT VIC AB 2-0 SH 27X BRD (SUTURE) IMPLANT
SUT VICRYL 0 UR6 27IN ABS (SUTURE) ×5 IMPLANT
SUT VLOC BARB 180 ABS3/0GR12 (SUTURE)
SUTURE V-LC BRB 180 2/0GR6GS22 (SUTURE) ×2 IMPLANT
SUTURE VLOC BRB 180 ABS3/0GR12 (SUTURE) IMPLANT
SYRINGE IRR TOOMEY STRL 70CC (SYRINGE) IMPLANT
TOWEL OR NON WOVEN STRL DISP B (DISPOSABLE) ×2 IMPLANT
TROCAR XCEL NON-BLD 5MMX100MML (ENDOMECHANICALS) IMPLANT
TUBING CONNECTING 10 (TUBING) IMPLANT
TUBING INSUFFLATION 10FT LAP (TUBING) IMPLANT
TUBING STONE CATCHER TRILOGY (MISCELLANEOUS) IMPLANT
TUBING UROLOGY SET (TUBING) ×1 IMPLANT
WATER STERILE IRR 1000ML POUR (IV SOLUTION) ×2 IMPLANT

## 2018-12-25 NOTE — Op Note (Addendum)
PREOPERATIVE DIAGNOSIS: BPH with incomplete emptying, bladder calculi  POSTOPERATIVE DIAGNOSIS: Same  PROCEDURES: 1. Robotic-assisted laparoscopic simple prostatectomy. 2. Cystolithotomy for a stone greater than 2.5cm  ANESTHESIA: General  ATTENDING: Nicolette Bang, MD  ASSISTANT: Debbrah Alar, PA  RESIDENT: Sharlot Gowda, MD  ESTIMATED BLOOD LOSS: 400 mL.  COMPLICATIONS: None.  SPECIMEN: 1.prostatic adenoma 2. Bladder calculi  ANTIBIOTICS: rocephin  FINDINGS: 3cm intravesical prostatic protrusion. 5 1-1.5cm bladder calculi.Ureteral orifices in normal anatomic location. No leaks from cystotomy at 150cc of water.  The assistant was utilized for retraction, suction, passing suture and removing the bladder calculi.  DRAINS: 1. Jackson-Pratt drain to bulb suction. 2. Foley catheter to straight drain.  INDICATION: [name] is a very pleasant 63 year old gentleman, who has BPH with significant LUTS including elevated PVR with bladder calculi. His TRUS volume is 125cc.  Options were discussed with the patient in detail for primary manage including continued surveillance protocols versus surgical extirpation with and without minimally invasive assistance and he wished to proceed with robotic simple prostatectomy. Informed consent was obtained and placed in the medical record.  PROCEDURE IN DETAIL: The patient was brought to the operating room and a breif timeout was down to ensure correct patient, correct procedure, and correct site. Intravenous antibiotics were administered. General endotracheal anesthesia was introduced. The patient was placed into a low lithotomy position after tucking his arms with foam padding, placing on a pink and non-slide foam pad. A test of steep Trendelenburg positioning was performed and he was found to be suitably positioned. Sterile field was created by prepping and draping the patient's penis, perineum and proximal thighs using iodine  and his infra-xiphoid abdomen using chlorhexidine gluconate. Next, a high-flow, low-pressure pneumoperitoneum was obtained using Veress technique in the infraumbilical midline having passed the aspiration and drop test. Next, a 8-mm robotic camera port was placed in the same location. Laparoscopic examination of the peritoneal cavity revealed no significant adhesions and no visceral injury. Additional ports were placed as follows: Right paramedian 8-mm robotic port, right far lateral 12-mm assistant port, left paramedian 8-mm robotic port, left far lateral 8-mm robotic port, and right paramedian 5-mm suction port. Robot was docked and passed through the electronic checks. Next, attention was directed for the development of space of Retzius. Incision was made lateral to the left medial umbilical ligament from the midline towards the area of the internal ring and coursing along the iliac vessels towards the area of the ureter, which was positively identified. The left bladder was dissected away from the pelvic sidewall towards the area of the endopelvic fascia. A mirror-imaged dissection was performed on the right side.  Additional anterior attachments were taken down using cautery scissors. Next, the bladder neck was identified moving the Foley catheter back and forth. We then made a 6cm transverse cystotomy 3cm from the bladder neck. We identified the ureteral orifices and care was taken to exclude then from the dissection. We then placed 2 holding stitches in the anterior bladder wall and secured it to Coopers ligament. We then removed the bladder calculi.  We then made a circumscribing incision around the base of the prostate. We then used a 0 vicryl in a figure of eight fashion in the base of the adenoma for traction. We proceeded with a posterior dissection of the prostate until we identified the capsule. We then used a combination of electrocautery, blunt and sharp dissection to free  the adenoma from the capsule. We then dissected laterally to the apex. Individual bleeders were  cauterized. We then dissected anteriorly along the capsule until we reached the apex. The adenoma was then freed and placed in an endocatch bag. We noted good hemostasis and no additional sutures were placed. A Foley catheter was then placed per urethra easily. We then tacked down the bladder neck to the prostatic fossa with a single interrupted 2-0 vicryl. We then proceeded to closed the cystotomy. We closed the bladder with a running 0 vicryl full thickness. We then performed a imbricating second layer  Closure with 0 vicryl. The bladder was then filled with 150cc of water and we noted no leak.  All sponge and needle counts were correct. A closed suction drain was brought to the previous left lateral robotic port site into the area of the peritoneal cavity. The previous right 12-mm assistant port was closed at the level of the fascia using a Carter-Thomason suture passer under laparoscopic vision. Robot was undocked. Specimen was retrieved by extending the previous camera port site inferiorly for distance approximately 3 cm and removing the prostatectomy specimens and setting aside for permanent pathology. The site was closed at the level of fascia using figure-of-eight 0 vicryl followed by reapproximation of the Scarpa's using running Vicryl. All incision sites were infiltrated with dilute Lyophilized Marcaine and closed at the level of the skin using subcuticular Monocryl followed by Dermabond. Procedure was then terminated. The patient tolerated the procedure well. There were no immediate periprocedural complications and the patient was taken to the postanesthesia care unit in stable Condition.  COMPLICATIONS: None  CONDITION: Stable, extubated, transferred to PACU  PLAN: The patient will be admitted for 1-2 for hydration, post operative monitoring and pain control. He will be discharged  home with foley in place and foley will be removed in 14 days, He will have a cystogram prior to foley catheter removal

## 2018-12-25 NOTE — H&P (Signed)
Urology Admission H&P  Chief Complaint: urinary retention  History of Present Illness: [name] is a 63yo with a hx of BPH, urinary retention and bladder calculi here for simple prostatectomy. He has severe LUTS on medical therapy. No fevers/chills/sweats  Past Medical History:  Diagnosis Date  . Asthma, persistent   . BPH (benign prostatic hyperplasia) 2014  . CHF (congestive heart failure) ([place])   . Combined hyperlipidemia   . Dysrhythmia    ATRIAL FIBRILLATION  . GERD (gastroesophageal reflux disease)   . History of kidney stones   . Hypertension   . 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 Obesity   . Persistent atrial fibrillation ([place])   . Renal stone 04/2012  . Seasonal allergic rhinitis   . Sleep apnea    does not use a cpap machine   Past Surgical History:  Procedure Laterality Date  . CARDIOVERSION N/A 04/23/2016   Procedure: CARDIOVERSION;  Surgeon: Jolaine Artist, MD;  Location: [facility] ENDOSCOPY;  Service: Cardiovascular;  Laterality: N/A;  . CYSTOSCOPY WITH RETROGRADE PYELOGRAM, URETEROSCOPY AND STENT PLACEMENT Left 07/29/2017   Procedure: CYSTOSCOPY WITH RETROGRADE PYELOGRAM AND LEFT STENT PLACEMENT;  Surgeon: Franchot Gallo, MD;  Location: [facility] ORS;  Service: Urology;  Laterality: Left;  . EXTRACORPOREAL SHOCK WAVE LITHOTRIPSY Left 08/08/2017   Procedure: LEFT EXTRACORPOREAL SHOCK WAVE LITHOTRIPSY (ESWL);  Surgeon: Nickie Retort, MD;  Location: [facility] ORS;  Service: Urology;  Laterality: Left;  . TEE WITHOUT CARDIOVERSION N/A 04/23/2016   Procedure: TRANSESOPHAGEAL ECHOCARDIOGRAM (TEE);  Surgeon: Jolaine Artist, MD;  Location: [facility] ENDOSCOPY;  Service: Cardiovascular;  Laterality: N/A;    Home Medications:  Current Facility-Administered Medications  Medication Dose Route Frequency Provider Last Rate Last Dose  . bupivacaine liposome (EXPAREL) 1.3 % injection 266 mg  20 mL Infiltration Once Cleon Gustin, MD      . cefTRIAXone (ROCEPHIN) 2 g in  sodium chloride 0.9 % 100 mL IVPB  2 g Intravenous 30 min Pre-Op Alyson Ingles Candee Furbish, MD      . lactated ringers infusion   Intravenous Continuous Brennan Bailey, MD 75 mL/hr at 12/25/18 1022     Allergies:  Allergies  Allergen Reactions  . Ibuprofen Other (See Comments)    Has a-Fib, unable to take  . Isosorbide Other (See Comments)    HEADACHES from Imdur  . Lisinopril Cough  . Digoxin And Related Rash    Family History  Problem Relation Age of Onset  . Hypertension Mother    Social History:  reports that he has never smoked. He has never used smokeless tobacco. He reports that he does not drink alcohol or use drugs.  Review of Systems  Genitourinary: Positive for frequency and urgency.  All other systems reviewed and are negative.   Physical Exam:  Vital signs in last 24 hours: Temp:  [98.2 F (36.8 C)] 98.2 F (36.8 C) (11/30 0954) Pulse Rate:  [58] 58 (11/30 0954) Resp:  [16] 16 (11/30 0954) BP: (140)/(80) 140/80 (11/30 0954) SpO2:  [97 %] 97 % (11/30 0954) Physical Exam  Constitutional: He is oriented to person, place, and time. He appears well-developed and well-nourished.  HENT:  Head: Normocephalic and atraumatic.  Eyes: Pupils are equal, round, and reactive to light. EOM are normal.  Neck: Normal range of motion. No thyromegaly present.  Cardiovascular: Normal rate and regular rhythm.  Respiratory: Effort normal. No respiratory distress.  GI: Soft. He exhibits no distension.  Musculoskeletal: Normal range of motion.  General: No edema.  Neurological: He is alert and oriented to person, place, and time.  Skin: Skin is warm and dry.  Psychiatric: He has a normal mood and affect. His behavior is normal. Judgment and thought content normal.    Laboratory Data:  No results found for this or any previous visit (from the past 24 hour(s)). Recent Results (from the past 240 hour(s))  SARS CORONAVIRUS 2 (TAT 6-24 HRS) Nasopharyngeal Nasopharyngeal Swab      Status: None   Collection Time: 12/22/18  9:51 AM   Specimen: Nasopharyngeal Swab  Result Value Ref Range Status   SARS Coronavirus 2 NEGATIVE NEGATIVE Final    Comment: (NOTE) SARS-CoV-2 target nucleic acids are NOT DETECTED. The SARS-CoV-2 RNA is generally detectable in upper and lower respiratory specimens during the acute phase of infection. Negative results do not preclude SARS-CoV-2 infection, do not rule out co-infections with other pathogens, and should not be used as the sole basis for treatment or other patient management decisions. Negative results must be combined with clinical observations, patient history, and epidemiological information. The expected result is Negative. Fact Sheet for Patients: SugarRoll.be Fact Sheet for Healthcare Providers: https://www.woods-mathews.com/ This test is not yet approved or cleared by the [place] FDA and  has been authorized for detection and/or diagnosis of SARS-CoV-2 by FDA under an Emergency Use Authorization (EUA). This EUA will remain  in effect (meaning this test can be used) for the duration of the COVID-19 declaration under Section 56 4(b)(1) of the Act, 21 U.S.C. section 360bbb-3(b)(1), unless the authorization is terminated or revoked sooner. Performed at [facility], [place] [address] [place], [place] [address]    Creatinine: Recent Labs    12/20/18 1429  CREATININE 2.09*   Baseline Creatinine: 2  Impression/Assessment:  63yo with BPH and bladder calculi  Plan:  The risks/benefits/alternatives to robotic simple prostatectomy and cystolithalopaxy was explained to the patient and he understands and wishes to proceed with surgery  Nicolette Bang 12/25/2018, 11:35 AM

## 2018-12-25 NOTE — Discharge Instructions (Signed)
1. Activity:  You are encouraged to ambulate frequently (about every hour during waking hours) to help prevent blood clots from forming in your legs or lungs.  However, you should not engage in any heavy lifting (> 10-15 lbs), strenuous activity, or straining. °2. Diet: You should continue a clear liquid diet until passing gas from below.  Once this occurs, you may advance your diet to a soft diet that would be easy to digest (i.e soups, scrambled eggs, mashed potatoes, etc.) for 24 hours just as you would if getting over a bad stomach flu.  If tolerating this diet well for 24 hours, you may then begin eating regular food.  It will be normal to have some amount of bloating, nausea, and abdominal discomfort intermittently. °3. Prescriptions:  You will be provided a prescription for pain medication to take as needed.  If your pain is not severe enough to require the prescription pain medication, you may take Tylenol instead.  You should also take an over the counter stool softener (Colace 100 mg twice daily) to avoid straining with bowel movements as the pain medication may constipate you. Finally, you will also be provided a prescription for an antibiotic to begin the day prior to your return visit in the office for catheter removal. °4. Catheter care: You will be taught how to take care of the catheter by the nursing staff prior to discharge from the hospital.  You may use both a leg bag and the larger bedside bag but it is recommended to at least use the bigger bedside bag at nighttime as the leg bag is small and will fill up overnight and also does not drain as well when lying flat. You may periodically feel a strong urge to void with the catheter in place.  This is a bladder spasm and most often can occur when having a bowel movement or when you are moving around. It is typically self-limited and usually will stop after a few minutes.  You may use some Vaseline or Neosporin around the tip of the catheter to  reduce friction at the tip of the penis. °5. Incisions: You may remove your dressing bandages the 2nd day after surgery.  You most likely will have a few small staples in each of the incisions and once the bandages are removed, the incisions may stay open to air.  You may start showering (not soaking or bathing in water) 48 hours after surgery and the incisions simply need to be patted dry after the shower.  No additional care is needed. °6. What to call us about: You should call the office ([phone]) if you develop fever > 101, persistent vomiting, or the catheter stops draining. Also, feel free to call with any other questions you may have and remember the handout that was provided to you as a reference preoperatively which answers many of the common questions that arise after surgery. °7. You may resume aspirin, advil, aleve, vitamins, and supplements 7 days after surgery. ° °You may resume Eliquis 5 days after surgery. ° °

## 2018-12-25 NOTE — Anesthesia Procedure Notes (Signed)
Procedure Name: Intubation Date/Time: 12/25/2018 11:54 AM Performed by: Silas Sacramento, CRNA Pre-anesthesia Checklist: Patient identified, Emergency Drugs available, Suction available and Patient being monitored Patient Re-evaluated:Patient Re-evaluated prior to induction Oxygen Delivery Method: Circle system utilized Preoxygenation: Pre-oxygenation with 100% oxygen Induction Type: IV induction Ventilation: Mask ventilation without difficulty Laryngoscope Size: Mac and 4 Grade View: Grade I Tube type: Oral Tube size: 7.5 mm Number of attempts: 1 Airway Equipment and Method: Stylet Placement Confirmation: ETT inserted through vocal cords under direct vision,  positive ETCO2 and breath sounds checked- equal and bilateral Secured at: 23 cm Tube secured with: Tape Dental Injury: Teeth and Oropharynx as per pre-operative assessment

## 2018-12-25 NOTE — Progress Notes (Signed)
Patient arrived to unit. Pt is A&Ox4 out of bed one assist. Gait steady. Foley cathter urine cherry color, no clots.

## 2018-12-25 NOTE — Progress Notes (Signed)
Urology Progress Note   Day of Surgery from robotic simple prostatectomy.   Subjective: Transferred to floor. Overall doing well.  "Sore belly" CBI on medium drip with translucent watermelon colored UOP.  Objective: Vital signs in last 24 hours: Temp:  [97.8 F (36.6 C)-98.2 F (36.8 C)] 98 F (36.7 C) (11/30 1620) Pulse Rate:  [48-58] 48 (11/30 1620) Resp:  [14-18] 18 (11/30 1620) BP: (128-146)/(76-83) 128/80 (11/30 1620) SpO2:  [97 %-99 %] 98 % (11/30 1530) Weight:  [99.6 kg] 99.6 kg (11/30 1647)  Intake/Output from previous day: No intake/output data recorded. Intake/Output this shift: Total I/O In: 2000 [I.V.:1150; Other:750; IV Piggyback:100] Out: 1160 [Urine:900; Drains:10; Blood:250]  Physical Exam:  General: Alert and oriented CV: Regular rate Lungs: No increased work of breathing Abdomen: Soft, appropriately tender. Incisions c/d/i. JP SS GU: Foley in place draining clear watermelon-colored urine  Ext: NT, No erythema  Lab Results: Recent Labs    12/25/18 1447  HGB 11.5*  HCT 36.7*   No results for input(s): NA, K, CL, CO2, GLUCOSE, BUN, CREATININE, CALCIUM in the last 72 hours.  Studies/Results: No results found.  Assessment/Plan:  63 y.o. male s/p robotic simple prostatectomy.  Overall doing well post-op.   PACU Hgb 11.5 from 11.8 preop  - Continue CBI, titrate to medium pink  - Continue JP drain - CLD tonight - Gentle IVF given CHF; lasix tomorrow - AM labs - Multimodal pain control - OOB/walk w assist today  Dispo: Pending   LOS: 0 days

## 2018-12-25 NOTE — Anesthesia Postprocedure Evaluation (Signed)
Anesthesia Post Note  Patient: DECORION VOTH  Procedure(s) Performed: XI ROBOTIC ASSISTED SIMPLE PROSTATECTOMY, REMOVAL OF BLADDER STONES (N/A )     Patient location during evaluation: PACU Anesthesia Type: General Level of consciousness: awake and alert and oriented Pain management: pain level controlled Vital Signs Assessment: post-procedure vital signs reviewed and stable Respiratory status: spontaneous breathing, nonlabored ventilation and respiratory function stable Cardiovascular status: blood pressure returned to baseline Postop Assessment: no apparent nausea or vomiting Anesthetic complications: no    Last Vitals:  Vitals:   12/25/18 1515 12/25/18 1530  BP: (!) 146/83 (!) 142/80  Pulse: (!) 52 (!) 52  Resp: 16 17  Temp:  36.6 C  SpO2: 99% 98%    Last Pain:  Vitals:   12/25/18 1530  TempSrc:   PainSc: 0-No pain                 Brennan Bailey

## 2018-12-25 NOTE — Transfer of Care (Signed)
Immediate Anesthesia Transfer of Care Note  Patient: [name]  Procedure(s) Performed: XI ROBOTIC ASSISTED SIMPLE PROSTATECTOMY, REMOVAL OF BLADDER STONES (N/A )  Patient Location: PACU  Anesthesia Type:General  Level of Consciousness: awake, alert , oriented and patient cooperative  Airway & Oxygen Therapy: Patient Spontanous Breathing and Patient connected to face mask oxygen  Post-op Assessment: Report given to RN and Post -op Vital signs reviewed and stable  Post vital signs: Reviewed and stable  Last Vitals:  Vitals Value Taken Time  BP    Temp    Pulse 55 12/25/18 1438  Resp 16 12/25/18 1438  SpO2 97 % 12/25/18 1438  Vitals shown include unvalidated device data.  Last Pain:  Vitals:   12/25/18 1019  TempSrc:   PainSc: 0-No pain         Complications: No apparent anesthesia complications

## 2018-12-26 ENCOUNTER — Encounter (HOSPITAL_COMMUNITY): Payer: Self-pay | Admitting: Urology

## 2018-12-26 LAB — HEMOGLOBIN AND HEMATOCRIT, BLOOD
HCT: 34.6 % — ABNORMAL LOW (ref 39.0–52.0)
Hemoglobin: 10.8 g/dL — ABNORMAL LOW (ref 13.0–17.0)

## 2018-12-26 LAB — BASIC METABOLIC PANEL
Anion gap: 7 (ref 5–15)
BUN: 23 mg/dL (ref 8–23)
CO2: 22 mmol/L (ref 22–32)
Calcium: 8.5 mg/dL — ABNORMAL LOW (ref 8.9–10.3)
Chloride: 106 mmol/L (ref 98–111)
Creatinine, Ser: 1.63 mg/dL — ABNORMAL HIGH (ref 0.61–1.24)
GFR calc Af Amer: 51 mL/min — ABNORMAL LOW (ref >60)
GFR calc non Af Amer: 44 mL/min — ABNORMAL LOW (ref >60)
Glucose, Bld: 140 mg/dL — ABNORMAL HIGH (ref 70–99)
Potassium: 4.5 mmol/L (ref 3.5–5.1)
Sodium: 135 mmol/L (ref 135–145)

## 2018-12-26 NOTE — Progress Notes (Signed)
Urology Progress Note   1 Day Post-Op from robotic simple prostatectomy.   Subjective: NAEO.  AHDS.  Overall doing well. Mild soreness diffusely in abdomen.  No nausea or emesis; tolerating CLD.  CBI on slow drip with translucent pale pink colored UOP. JP with only 35cc out since OR. Cr improved to 1.6 Hgb stable at 10.8 from 11.5 post op.  Objective: Vital signs in last 24 hours: Temp:  [97.8 F (36.6 C)-98.5 F (36.9 C)] 98.4 F (36.9 C) (12/01 1030) Pulse Rate:  [48-58] 56 (12/01 1030) Resp:  [14-18] 18 (12/01 1030) BP: (111-146)/(50-83) 111/50 (12/01 1030) SpO2:  [93 %-99 %] 93 % (12/01 1030) Weight:  [99.6 kg] 99.6 kg (11/30 1647)  Intake/Output from previous day: 11/30 0701 - 12/01 0700 In: [id number] [P.O.:240; I.V.:2262; IV Piggyback:100] Out: [id number] [Urine:10500; Drains:35; Blood:250] Intake/Output this shift: No intake/output data recorded.  Physical Exam:  General: Alert and oriented CV: Regular rate Lungs: No increased work of breathing Abdomen: Soft, appropriately tender. Incisions c/d/i. JP SS GU: Foley in place draining clear watermelon-colored urine  Ext: NT, No erythema  Lab Results: Recent Labs    12/25/18 1447 12/26/18 0419  HGB 11.5* 10.8*  HCT 36.7* 34.6*   Recent Labs    12/26/18 0419  NA 135  K 4.5  CL 106  CO2 22  GLUCOSE 140*  BUN 23  CREATININE 1.63*  CALCIUM 8.5*    Studies/Results: No results found.  Assessment/Plan:  63 y.o. male s/p robotic simple prostatectomy.  Overall doing well post-op.   Minimal JP output.  Labs stable.  Will wean CBI today.   - Continue CBI, titrate to medium pink  - Continue JP drain, anticipate removal tomorrow pending output - Advance to General Diet.  - Gentle IVF given CHF; resume lasix  - Multimodal pain control - OOB/walk  Dispo: Pending, anticipate tomorrow with Foley.    LOS: 1 day

## 2018-12-27 LAB — CREATININE, SERUM
Creatinine, Ser: 1.74 mg/dL — ABNORMAL HIGH (ref 0.61–1.24)
GFR calc Af Amer: 47 mL/min — ABNORMAL LOW (ref >60)
GFR calc non Af Amer: 41 mL/min — ABNORMAL LOW (ref >60)

## 2018-12-27 MED ORDER — OXYBUTYNIN CHLORIDE 5 MG PO TABS: 5.0000 mg | ORAL_TABLET | Freq: Three times a day (TID) | ORAL | 0 refills | Status: AC | PRN

## 2018-12-27 MED ORDER — APIXABAN 5 MG PO TABS: 5.0000 mg | ORAL_TABLET | Freq: Two times a day (BID) | ORAL | Status: DC

## 2018-12-27 NOTE — Discharge Summary (Signed)
[facility] Discharge Summary  Admit date: 12/25/2018  Discharge date and time: 12/27/18   Discharge to: Home  Discharge Service: Urology  Discharge Attending Physician:  Dr. Nicolette Bang, MD  Discharge  Diagnoses: BPH with LUTS; bladder stones  Secondary Diagnosis: Active Problems:   BPH with obstruction/lower urinary tract symptoms  OR Procedures: Procedure(s): XI ROBOTIC ASSISTED SIMPLE PROSTATECTOMY, REMOVAL OF BLADDER STONES 12/25/2018   Ancillary Procedures: None   Discharge Day Services: The patient was seen and examined by the Urology team both in the morning and immediately prior to discharge.  Vital signs and laboratory values were stable and within normal limits. JP Cr = Serum so drain was removed.  The physical exam was benign and unchanged and all surgical wounds were examined.  Discharge instructions were explained and all questions answered.  Subjective  No acute events overnight. Pain Controlled. No fever or chills.  Objective Patient Vitals for the past 8 hrs:  BP Temp Pulse Resp SpO2  12/27/18 0418 120/69 98.5 F (36.9 C) (!) 53 17 94 %   Total I/O In: -  Out: 5 [Drains:5]  General Appearance:        No acute distress Lungs:                       Normal work of breathing on room air Heart:                                Regular rate and rhythm Abdomen:                         Soft, appropriately tender, non-distended.  Extremities:                      Warm and well perfused GU:         Foley catheter in place draining clear light pink urine without clot.   Hospital Course:  The patient underwent robotic simple prostatectomy and removal bladder stones on 12/25/2018.  The patient tolerated the procedure well, was extubated in the OR, and afterwards was taken to the PACU for routine post-surgical care. When stable the patient was transferred to the floor.   The patient did well postoperatively.  He was weaned off CBI on postoperative day 1.  The  patients diet was slowly advanced and at the time of discharge was tolerating a regular diet.  The patient was discharged home 2 Days Post-Op, at which point was tolerating a regular solid diet, was able to care for his catheter himself, have adequate pain control with P.O. pain medication, and could ambulate without difficulty. The patient will follow up with [place] for cystogram in approximately 2 weeks.  He will resume his Eliquis 5 days postoperatively.   Condition at Discharge: Improved  Discharge Medications:  Allergies as of 12/27/2018      Reactions   Ibuprofen Other (See Comments)   Has a-Fib, unable to take   Isosorbide Other (See Comments)   HEADACHES from Imdur   Lisinopril Cough   Digoxin And Related Rash      Medication List    STOP taking these medications   nitrofurantoin 100 MG capsule Commonly known as: MACRODANTIN     TAKE these medications   acetaminophen 325 MG tablet Commonly known as: TYLENOL Take 2 tablets (650 mg total) by mouth every 6 (six) hours as  needed for mild pain or headache.   albuterol 108 (90 Base) MCG/ACT inhaler Commonly known as: VENTOLIN HFA Inhale 2 puffs into the lungs every 6 (six) hours as needed for wheezing or shortness of breath.   amiodarone 400 MG tablet Commonly known as: PACERONE Take 200 mg by mouth daily.   apixaban 5 MG Tabs tablet Commonly known as: Eliquis Take 1 tablet (5 mg total) by mouth 2 (two) times daily. Start taking on: December 31, 2018 What changed:   These instructions start on December 31, 2018. If you are unsure what to do until then, ask your doctor or other care provider.  Another medication with the same name was removed. Continue taking this medication, and follow the directions you see here.   Entresto 97-103 MG Generic drug: sacubitril-valsartan Take 1 tablet by mouth 2 (two) times daily.   Fluticasone-Salmeterol 250-50 MCG/DOSE Aepb Commonly known as: ADVAIR Inhale 1 puff into the lungs 2 (two)  times daily.   furosemide 40 MG tablet Commonly known as: LASIX Take 40 mg by mouth daily.   HYDROcodone-acetaminophen 5-325 MG tablet Commonly known as: Norco Take 1-2 tablets by mouth every 6 (six) hours as needed.   oxybutynin 5 MG tablet Commonly known as: DITROPAN Take 1 tablet (5 mg total) by mouth every 8 (eight) hours as needed for up to 15 days for bladder spasms.   pantoprazole 40 MG tablet Commonly known as: PROTONIX Take 40 mg by mouth daily.   potassium chloride 10 MEQ tablet Commonly known as: KLOR-CON Take 20-30 mEq by mouth See admin instructions. Take 30 meq by mouth in the morning and 20 meq in the evening.   sulfamethoxazole-trimethoprim 800-160 MG tablet Commonly known as: BACTRIM DS Take 1 tablet by mouth 2 (two) times daily. Start the day prior to foley removal appointment   tadalafil 5 MG tablet Commonly known as: CIALIS Take 5 mg by mouth daily.   tamsulosin 0.4 MG Caps capsule Commonly known as: FLOMAX Take 0.8 mg by mouth at bedtime.

## 2018-12-27 NOTE — Progress Notes (Signed)
Pt discharged home with all belongings. Discharge education completed by charge RN.

## 2018-12-27 NOTE — Plan of Care (Signed)
  Problem: Clinical Measurements: Goal: Diagnostic test results will improve Outcome: Completed/Met Goal: Respiratory complications will improve Outcome: Completed/Met Goal: Cardiovascular complication will be avoided Outcome: Completed/Met   

## 2018-12-28 LAB — SURGICAL PATHOLOGY

## 2019-01-08 DIAGNOSIS — N210 Calculus in bladder: Secondary | ICD-10-CM | POA: Diagnosis not present

## 2019-01-12 ENCOUNTER — Encounter (HOSPITAL_COMMUNITY): Payer: Self-pay | Admitting: Internal Medicine

## 2019-01-12 ENCOUNTER — Other Ambulatory Visit: Payer: Self-pay

## 2019-01-12 ENCOUNTER — Ambulatory Visit (HOSPITAL_COMMUNITY)
Admission: RE | Admit: 2019-01-12 | Discharge: 2019-01-12 | Disposition: A | Discharge status: Home / Self Care | Payer: BC Managed Care – PPO | Source: Ambulatory Visit | Attending: Internal Medicine | Admitting: Internal Medicine

## 2019-01-12 VITALS — BP 113/80 | HR 45 | Wt 215.0 lb

## 2019-01-12 DIAGNOSIS — E669 Obesity, unspecified: Secondary | ICD-10-CM | POA: Insufficient documentation

## 2019-01-12 DIAGNOSIS — I110 Hypertensive heart disease with heart failure: Secondary | ICD-10-CM | POA: Diagnosis present

## 2019-01-12 DIAGNOSIS — K219 Gastro-esophageal reflux disease without esophagitis: Secondary | ICD-10-CM | POA: Insufficient documentation

## 2019-01-12 DIAGNOSIS — I428 Other cardiomyopathies: Secondary | ICD-10-CM | POA: Insufficient documentation

## 2019-01-12 DIAGNOSIS — I130 Hypertensive heart and chronic kidney disease with heart failure and stage 1 through stage 4 chronic kidney disease, or unspecified chronic kidney disease: Secondary | ICD-10-CM | POA: Insufficient documentation

## 2019-01-12 DIAGNOSIS — Z888 Allergy status to other drugs, medicaments and biological substances status: Secondary | ICD-10-CM | POA: Diagnosis not present

## 2019-01-12 DIAGNOSIS — Z886 Allergy status to analgesic agent status: Secondary | ICD-10-CM | POA: Diagnosis not present

## 2019-01-12 DIAGNOSIS — I480 Paroxysmal atrial fibrillation: Secondary | ICD-10-CM | POA: Diagnosis not present

## 2019-01-12 DIAGNOSIS — Z6829 Body mass index (BMI) 29.0-29.9, adult: Secondary | ICD-10-CM | POA: Insufficient documentation

## 2019-01-12 DIAGNOSIS — Z79899 Other long term (current) drug therapy: Secondary | ICD-10-CM | POA: Diagnosis not present

## 2019-01-12 DIAGNOSIS — Z8249 Family history of ischemic heart disease and other diseases of the circulatory system: Secondary | ICD-10-CM | POA: Insufficient documentation

## 2019-01-12 DIAGNOSIS — Z7901 Long term (current) use of anticoagulants: Secondary | ICD-10-CM | POA: Insufficient documentation

## 2019-01-12 DIAGNOSIS — N400 Enlarged prostate without lower urinary tract symptoms: Secondary | ICD-10-CM | POA: Insufficient documentation

## 2019-01-12 DIAGNOSIS — G4733 Obstructive sleep apnea (adult) (pediatric): Secondary | ICD-10-CM | POA: Insufficient documentation

## 2019-01-12 DIAGNOSIS — I5022 Chronic systolic (congestive) heart failure: Secondary | ICD-10-CM | POA: Diagnosis not present

## 2019-01-12 DIAGNOSIS — Z7951 Long term (current) use of inhaled steroids: Secondary | ICD-10-CM | POA: Insufficient documentation

## 2019-01-12 DIAGNOSIS — N1831 Chronic kidney disease, stage 3a: Secondary | ICD-10-CM | POA: Diagnosis not present

## 2019-01-12 DIAGNOSIS — Z87442 Personal history of urinary calculi: Secondary | ICD-10-CM | POA: Diagnosis not present

## 2019-01-12 DIAGNOSIS — Z86718 Personal history of other venous thrombosis and embolism: Secondary | ICD-10-CM | POA: Diagnosis not present

## 2019-01-12 DIAGNOSIS — N1832 Chronic kidney disease, stage 3b: Secondary | ICD-10-CM | POA: Diagnosis not present

## 2019-01-12 DIAGNOSIS — J302 Other seasonal allergic rhinitis: Secondary | ICD-10-CM | POA: Diagnosis not present

## 2019-01-12 NOTE — Progress Notes (Addendum)
Advanced Heart Failure Clinic Note    Primary Cardiologist: Dr. Johnsie Cancel HF: Dr Haroldine Laws  PCP: Dr Laurann [place] Urology:  Dr Diona Fanti   HPI: [name] is a 63 year old male with a past medical history of tachy mediated cardiomyopathy, persistent atrial fibrillation (on Eliquis), systolic CHF (EF 47-65%) and CKD 3b.   He was first diagnosed with Afib in 02/2016, seen in the Afib clinic and the ED for this. He underwent 2 DC-CV and only held NSR for a few days. He started Amiodarone.   Admitted 04/16/16-04/27/16 with rapid afib and decompensation. He was started on milrinone for marginal mixed venous sat of 57% and optimization with plans for repeat DCCV after loading with IV Amiodarone. Underwent TEE/DCCV on 04/23/16 with restoration of NSR.He was continued on Eliquis for anticoagulation at discharge, as he did have some evidence of LV thrombus on Echo, however it appeared to have resolved when he had a TEE on 04/23/16. Overall diuresed 9.5L and discharge weight was 217 pounds.   July 2019 he had kidney stones and underwent lithotripsy.   Echo 7/20 EF 50-55%.   In July 2020 was doing very well. EF 50-55%  Maintaining NSR on amio 200 daily. Amio cut back to 100 daily due to sinus brady with HR in 40s. Several weeks later presented for a sick visit with recurrent AF with RVR, We reloaded amio with 400 bid and scheduled DC-CV. Several days later presented for DC-CV and was in NSR.   Returns for routine f/u. Saw Dr. Rayann Heman in 9/20 and plan was for PVI but developed kidney stones and hematuria. Needed stone extraction and prostate reduction surgery.   Returns for routine f/u. Feels good. Remains active. Tracks HR on pulse ox. Remains in sinus on amio 200 daily. HR 45-50 at rest and 60+ with exertion. No edema, orthopnea or PND   Echo 3/18 EF 40-45% RV mildly HK  Echo 2/19 EF 40-45% RV mildly reduced ECHO 08/01/2018 EF 50-55%   Review of systems complete and found to be negative unless listed in HPI.    Past Medical History:  Diagnosis Date  . Asthma, persistent   . BPH (benign prostatic hyperplasia) 2014  . CHF (congestive heart failure) ([place])   . Combined hyperlipidemia   . Dysrhythmia    ATRIAL FIBRILLATION  . GERD (gastroesophageal reflux disease)   . History of kidney stones   . Hypertension   . 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 Obesity   . Persistent atrial fibrillation ([place])   . Renal stone 04/2012  . Seasonal allergic rhinitis   . Sleep apnea    does not use a cpap machine    Current Outpatient Medications  Medication Sig Dispense Refill  . acetaminophen (TYLENOL) 325 MG tablet Take 2 tablets (650 mg total) by mouth every 6 (six) hours as needed for mild pain or headache.    . albuterol (PROVENTIL HFA;VENTOLIN HFA) 108 (90 Base) MCG/ACT inhaler Inhale 2 puffs into the lungs every 6 (six) hours as needed for wheezing or shortness of breath.    Marland Kitchen amiodarone (PACERONE) 400 MG tablet Take 200 mg by mouth daily.     Marland Kitchen apixaban (ELIQUIS) 5 MG TABS tablet Take 1 tablet (5 mg total) by mouth 2 (two) times daily. 60 tablet   . Fluticasone-Salmeterol (ADVAIR) 250-50 MCG/DOSE AEPB Inhale 1 puff into the lungs 2 (two) times daily.     . furosemide (LASIX) 40 MG tablet Take 40 mg by mouth daily.     Marland Kitchen  HYDROcodone-acetaminophen (NORCO) 5-325 MG tablet Take 1-2 tablets by mouth every 6 (six) hours as needed. 20 tablet 0  . pantoprazole (PROTONIX) 40 MG tablet Take 40 mg by mouth daily.    . potassium chloride (K-DUR) 10 MEQ tablet Take 20-30 mEq by mouth See admin instructions. Take 30 meq by mouth in the morning and 20 meq in the evening.    . sacubitril-valsartan (ENTRESTO) 97-103 MG Take 1 tablet by mouth 2 (two) times daily. 180 tablet 3  . sulfamethoxazole-trimethoprim (BACTRIM DS) 800-160 MG tablet Take 1 tablet by mouth 2 (two) times daily. Start the day prior to foley removal appointment 6 tablet 0  . tadalafil (CIALIS) 5 MG tablet Take 5 mg by mouth daily.     .  tamsulosin (FLOMAX) 0.4 MG CAPS capsule Take 0.8 mg by mouth at bedtime.     No current facility-administered medications for this encounter.    Allergies  Allergen Reactions  . Ibuprofen Other (See Comments)    Has a-Fib, unable to take  . Isosorbide Other (See Comments)    HEADACHES from Imdur  . Lisinopril Cough  . Digoxin And Related Rash      Social History   Socioeconomic History  . Marital status: Married    Spouse name: Not on file  . Number of children: Not on file  . Years of education: Not on file  . Highest education level: Not on file  Occupational History  . Occupation: GREENHOUSE MANAGEMENT  Tobacco Use  . Smoking status: Never Smoker  . Smokeless tobacco: Never Used  Substance and Sexual Activity  . Alcohol use: No  . Drug use: No  . Sexual activity: Not on file  Other Topics Concern  . Not on file  Social History Narrative   Lives in [place] with his spouse   He works as a Research scientist (life sciences) Strain:   . Difficulty of Paying Living Expenses: Not on file  Food Insecurity:   . Worried About Charity fundraiser in the Last Year: Not on file  . Ran Out of Food in the Last Year: Not on file  Transportation Needs:   . Lack of Transportation (Medical): Not on file  . Lack of Transportation (Non-Medical): Not on file  Physical Activity:   . Days of Exercise per Week: Not on file  . Minutes of Exercise per Session: Not on file  Stress:   . Feeling of Stress : Not on file  Social Connections:   . Frequency of Communication with Friends and Family: Not on file  . Frequency of Social Gatherings with Friends and Family: Not on file  . Attends Religious Services: Not on file  . Active Member of Clubs or Organizations: Not on file  . Attends Archivist Meetings: Not on file  . Marital Status: Not on file  Intimate Partner Violence:   . Fear of Current or Ex-Partner: Not on file  . Emotionally  Abused: Not on file  . Physically Abused: Not on file  . Sexually Abused: Not on file      Family History  Problem Relation Age of Onset  . Hypertension Mother     Vitals:   01/12/19 1512  BP: 113/80  Pulse: (!) 45  SpO2: 97%  Weight: 97.5 kg (215 lb)   Wt Readings from Last 3 Encounters:  12/25/18 99.6 kg (219 lb 9.3 oz)  12/20/18 99.6 kg (219 lb 9.6 oz)  10/25/18 101.6 kg (224 lb)     PHYSICAL EXAM: General:  Well appearing. No resp difficulty HEENT: normal Neck: supple. no JVD. Carotids 2+ bilat; no bruits. No lymphadenopathy or thryomegaly appreciated. Cor: PMI nondisplaced. Regular brady No rubs, gallops or murmurs. Lungs: clear Abdomen: soft, nontender, nondistended. No hepatosplenomegaly. No bruits or masses. Good bowel sounds. Extremities: no cyanosis, clubbing, rash, tr edema around ankles Neuro: alert & orientedx3, cranial nerves grossly intact. moves all 4 extremities w/o difficulty. Affect pleasant  EKG: Sinus brady 45 RBBB Personally reviewed   ASSESSMENT & PLAN: 1. Chronic systolic CHF: EF 62-95%.  NICM, felt to be tachy mediated. ? AF. - Echo 06/2016 LVEF 40%. - Echo 02/2017: EF 40-45% - ECHO 7/20 EF 50-55%.   - NYHA I.  - Volume status ok off lasix - Continue Entresto to 97/103 BID. - Intolerant to Imdur, caused headaches.  - Off hydralazine 25 mg TID -  Will not restart now with lower BPs - No BB with bradycardia. - We had previously discussed proceeding with coronary angio to exclude CAD but as he is doing well and no angina and CKD will hold off. If develops symptoms or EF worse can reconsider.   2. Paroxsymal atrial fibrillation - Amio cut back to 100 daily in July due to sinus bradycardia and developed AF with RVR - Amio reloaded and converted to NSR - Now back on amio 200 daily. Maintaining NSR but is quite slow.  - Met with Dr. Rayann Heman in 9/20 and plan for PVI. Plans got postponed due to recent bladder surgery. Will need to reschedule. -  Hopefully can stop amio several weeks after PVI and bradycardia will resolve. Otherwise will need PPM - Continue Eliquis. No bleeding issues.   3. Hypertension - BP looks good  4. History of LV thrombus - Resolved. No change.   5. Chronic kidney disease stage IIIb:  - baseline creatinine 1.8-2.0.  - stable at 1.7 on 12/2  6. OSA - Sleep study 4/18 with mild OSA. AHI 13.8/hr.  - Has never gotten CPAP.Plan was to repeat home sleep study when out of AF and then refer back to Dr. Beverly Milch, MD  3:11 PM  01/12/19

## 2019-01-12 NOTE — Patient Instructions (Signed)
Dr Jackalyn Lombard office will call you to schedule an appointment with him  Please call our office in May 2021 to schedule follow up for June.  If you have any questions or concerns before your next appointment please send [place] a message through [place] or call our office at [phone].  At the [facility], you and your health needs are our priority. As part of our continuing mission to provide you with exceptional heart care, we have created designated Provider Care Teams. These Care Teams include your primary Cardiologist (physician) and Advanced Practice Providers (APPs- Physician Assistants and Nurse Practitioners) who all work together to provide you with the care you need, when you need it.   You may see any of the following providers on your designated Care Team at your next follow up: Marland Kitchen Dr Glori Bickers . Dr Loralie Champagne . Darrick Grinder, NP . Lyda Jester, PA . Audry Riles, PharmD   Please be sure to bring in all your medications bottles to every appointment.

## 2019-01-18 ENCOUNTER — Other Ambulatory Visit: Payer: Self-pay | Admitting: Urology

## 2019-01-20 ENCOUNTER — Other Ambulatory Visit (HOSPITAL_COMMUNITY): Payer: Self-pay | Admitting: Internal Medicine

## 2019-01-22 ENCOUNTER — Other Ambulatory Visit (HOSPITAL_COMMUNITY): Payer: Self-pay

## 2019-01-22 MED ORDER — APIXABAN 5 MG PO TABS: 5.0000 mg | ORAL_TABLET | Freq: Two times a day (BID) | ORAL | 5 refills | Status: DC

## 2019-01-23 NOTE — Addendum Note (Signed)
Encounter addended by: Jolaine Artist, MD on: 01/23/2019 10:55 AM  Actions taken: Clinical Note Signed

## 2019-01-29 ENCOUNTER — Telehealth (INDEPENDENT_AMBULATORY_CARE_PROVIDER_SITE_OTHER): Payer: BC Managed Care – PPO | Admitting: Internal Medicine

## 2019-01-29 ENCOUNTER — Encounter: Payer: Self-pay | Admitting: Internal Medicine

## 2019-01-29 ENCOUNTER — Other Ambulatory Visit: Payer: Self-pay

## 2019-01-29 VITALS — Ht 72.0 in | Wt 220.0 lb

## 2019-01-29 DIAGNOSIS — N1832 Chronic kidney disease, stage 3b: Secondary | ICD-10-CM

## 2019-01-29 DIAGNOSIS — I428 Other cardiomyopathies: Secondary | ICD-10-CM

## 2019-01-29 DIAGNOSIS — I4819 Other persistent atrial fibrillation: Secondary | ICD-10-CM

## 2019-01-29 DIAGNOSIS — G4733 Obstructive sleep apnea (adult) (pediatric): Secondary | ICD-10-CM

## 2019-01-29 DIAGNOSIS — R001 Bradycardia, unspecified: Secondary | ICD-10-CM

## 2019-01-29 NOTE — Progress Notes (Signed)
Electrophysiology TeleHealth Note  Due to national recommendations of social distancing due to [place] 19, an audio telehealth visit is felt to be most appropriate for this patient at this time.  Verbal consent was obtained by me for the telehealth visit today.  The patient does not have capability for a virtual visit.  A phone visit is therefore required today.   Date:  01/29/2019   ID:  [name], DOB 1955-04-03, MRN [id number]  Location: patient's home  Provider location:  [facility]  Evaluation Performed: Follow-up visit  PCP:  [name], MD   Electrophysiologist:  Dr Rayann Heman  Chief Complaint:  palpitations  History of Present Illness:    [name] is a 64 y.o. male who presents via telehealth conferencing today.  Since last being seen in our clinic, the patient reports doing very well.  He developed kidney stones and hematuria requiring extensive urologic surgery.  He has been placed back on eliquis but continues to have some hematuria.  No afib events.  Today, he denies symptoms of palpitations, chest pain, shortness of breath,  lower extremity edema, dizziness, presyncope, or syncope.  The patient is otherwise without complaint today.  The patient denies symptoms of fevers, chills, cough, or new SOB worrisome for COVID 19.  Past Medical History:  Diagnosis Date  . Asthma, persistent   . BPH (benign prostatic hyperplasia) 2014  . CHF (congestive heart failure) ([place])   . Combined hyperlipidemia   . Dysrhythmia    ATRIAL FIBRILLATION  . GERD (gastroesophageal reflux disease)   . History of kidney stones   . Hypertension   . 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 Obesity   . Persistent atrial fibrillation ([place])   . Renal stone 04/2012  . Seasonal allergic rhinitis   . Sleep apnea    does not use a cpap machine    Past Surgical History:  Procedure Laterality Date  . CARDIOVERSION N/A 04/23/2016   Procedure: CARDIOVERSION;  Surgeon: Jolaine Artist, MD;  Location: [facility] ENDOSCOPY;  Service: Cardiovascular;  Laterality: N/A;  . CYSTOSCOPY WITH RETROGRADE PYELOGRAM, URETEROSCOPY AND STENT PLACEMENT Left 07/29/2017   Procedure: CYSTOSCOPY WITH RETROGRADE PYELOGRAM AND LEFT STENT PLACEMENT;  Surgeon: Franchot Gallo, MD;  Location: [facility] ORS;  Service: Urology;  Laterality: Left;  . EXTRACORPOREAL SHOCK WAVE LITHOTRIPSY Left 08/08/2017   Procedure: LEFT EXTRACORPOREAL SHOCK WAVE LITHOTRIPSY (ESWL);  Surgeon: Nickie Retort, MD;  Location: [facility] ORS;  Service: Urology;  Laterality: Left;  . TEE WITHOUT CARDIOVERSION N/A 04/23/2016   Procedure: TRANSESOPHAGEAL ECHOCARDIOGRAM (TEE);  Surgeon: Jolaine Artist, MD;  [facility] ENDOSCOPY;  Service: Cardiovascular;  Laterality: N/A;  . XI ROBOTIC ASSISTED SIMPLE PROSTATECTOMY N/A 12/25/2018   Procedure: XI ROBOTIC ASSISTED SIMPLE PROSTATECTOMY, REMOVAL OF BLADDER STONES;  Surgeon: Cleon Gustin, MD;  Location: [facility] ORS;  Service: Urology;  Laterality: N/A;    Current Outpatient Medications  Medication Sig Dispense Refill  . acetaminophen (TYLENOL) 325 MG tablet Take 2 tablets (650 mg total) by mouth every 6 (six) hours as needed for mild pain or headache.    . albuterol (PROVENTIL HFA;VENTOLIN HFA) 108 (90 Base) MCG/ACT inhaler Inhale 2 puffs into the lungs every 6 (six) hours as needed for wheezing or shortness of breath.    Marland Kitchen amiodarone (PACERONE) 400 MG tablet Take 200 mg by mouth daily.     Marland Kitchen apixaban (ELIQUIS) 5 MG TABS tablet Take 1 tablet (5 mg total) by mouth 2 (two)  times daily. 60 tablet 5  . Fluticasone-Salmeterol (ADVAIR) 250-50 MCG/DOSE AEPB Inhale 1 puff into the lungs 2 (two) times daily.     . furosemide (LASIX) 40 MG tablet Take 40 mg by mouth daily.     Marland Kitchen oxybutynin (DITROPAN-XL) 5 MG 24 hr tablet TAKE 1 TABLET BY MOUTH EVERY DAY 30 tablet 1  . pantoprazole (PROTONIX) 40 MG tablet Take 40 mg by mouth daily.    . potassium chloride (K-DUR) 10 MEQ tablet Take 20-30 mEq  by mouth See admin instructions. Take 30 meq by mouth in the morning and 20 meq in the evening.    . potassium chloride (KLOR-CON) 10 MEQ tablet Take 20-30 mEq by mouth See admin instructions. Take 30 meq by mouth in the morning and 20 meq in the evening. 360 tablet 1  . sacubitril-valsartan (ENTRESTO) 97-103 MG Take 1 tablet by mouth 2 (two) times daily. 180 tablet 3  . tadalafil (CIALIS) 10 MG tablet Take 10 mg by mouth daily as needed for erectile dysfunction.     No current facility-administered medications for this visit.    Allergies:   Ibuprofen, Isosorbide, Lisinopril, and Digoxin and related   Social History:  The patient  reports that he has never smoked. He has never used smokeless tobacco. He reports that he does not drink alcohol or use drugs.   Family History:  The patient's family history includes Hypertension in his mother.   ROS:  Please see the history of present illness.   All other systems are personally reviewed and negative.    Exam:    Vital Signs:  Ht 6' (1.829 m)   Wt 220 lb (99.8 kg)   BMI 29.84 kg/m   Well sounding, alert and conversant   Labs/Other Tests and Data Reviewed:    Recent Labs: 08/01/2018: ALT 19 10/16/2018: B Natriuretic Peptide 243.9 12/20/2018: Platelets 261 12/26/2018: BUN 23; Hemoglobin 10.8; Potassium 4.5; Sodium 135 12/27/2018: Creatinine, Ser 1.74   Wt Readings from Last 3 Encounters:  01/29/19 220 lb (99.8 kg)  01/12/19 215 lb (97.5 kg)  12/25/18 219 lb 9.3 oz (99.6 kg)      ASSESSMENT & PLAN:    1.  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 He has failed medical therapy with amiodarone. He and I discussed ablation back in September however this was delayed due to renal stones, hematuria, and substantial urologic surgery required. He continues to have some hematuria but is back on eliquis and healing. He has follow-up in 1-2 weeks with urology. We  will plan to proceed with ablation once his hematuria is resolved and he has been released by his urologist. Continue eliquis for chads2vasc score of 2. Will plan cardiac CT prior to ablation if renal function will allow.  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 OSA Compliance with therapy is advisd  5. HTN Stable No change required today    Follow-up:  We will proceed with ablation as above if cleared by urology,  Otherwise I will see him back in 2 months   Patient Risk:  after full review of this patients clinical status, I feel that they are at moderate risk at this time.  Today, I have spent 20 minutes with the patient with telehealth technology discussing arrhythmia management .    Signed, Thompson Grayer, MD  01/29/2019 10:21 AM     [place] [address] [phone] (office) [phone] (fax)

## 2019-01-31 ENCOUNTER — Telehealth: Payer: Self-pay

## 2019-01-31 DIAGNOSIS — I4891 Unspecified atrial fibrillation: Secondary | ICD-10-CM

## 2019-01-31 DIAGNOSIS — I4819 Other persistent atrial fibrillation: Secondary | ICD-10-CM

## 2019-01-31 NOTE — Telephone Encounter (Signed)
-----   Message from Thompson Grayer, MD sent at 01/29/2019 10:23 AM EST ----- Please call him in 2 weeks to see if urology has cleared him for afib ablation  If so, go ahead and schedule his afib ablation with [place],ICE, anesthesia.  If he is not ready, then have him follow-up with me in 2 months.

## 2019-02-13 ENCOUNTER — Other Ambulatory Visit: Payer: Self-pay | Admitting: Urology

## 2019-02-14 NOTE — Telephone Encounter (Signed)
Outreach made to [facility] urology medical records for Pt most recent office visit.  Pt office visit is not scheduled until 02/19/2019.  Per medical records Pt needs to request NP that he is seeing to [place] Dr. Rayann Heman so records can be sent to office.  Call placed to Pt.  Advised to inform his doctor on Monday to CC Dr. Rayann Heman so we can get his notes.  Pt indicates understanding.  Will continue to follow.

## 2019-02-23 NOTE — Telephone Encounter (Signed)
Last urology OV note reviewed and no mention of clearance to move forward with afib ablation.  Faxed letter to Dr. Beatrix Fetters requesting clearance from a urology stand point for Pt to proceed with afib ablation.

## 2019-02-27 ENCOUNTER — Telehealth: Payer: Self-pay | Admitting: Internal Medicine

## 2019-02-27 NOTE — Telephone Encounter (Signed)
Returned call to [place].  She will obtain something in writing for the urologist stating ok for Pt to resume bloodthinners for upcoming ablation.  Will fax to office.

## 2019-02-27 NOTE — Telephone Encounter (Signed)
Geraldine Solar, from [facility] was calling to give verbal about a letter that was sent to them about patient being able to start back on his blood thinners.  She can be reached at [phone].

## 2019-02-28 ENCOUNTER — Telehealth: Payer: Self-pay | Admitting: Internal Medicine

## 2019-02-28 NOTE — Telephone Encounter (Signed)
New Message   Patient has questions about setting up ablation. Please call.

## 2019-03-01 NOTE — Telephone Encounter (Signed)
Called patient to inform that per Dr. Jackalyn Lombard nurse, she is waiting for approval from patient's urologist, at [facility] before she can schedule.  He is aware that she will be checking back with their office and then will call him.  Pt appreciative for the information provided.

## 2019-03-06 NOTE — Telephone Encounter (Signed)
Approval note from urology will be faxed to office today.  Left message for Pt requesting call back.  Will tentatively plan for afib ablation on February 23 to allow time to schedule cardiac CT.  Pt with kidney issues-will review cardiac CT instructions.  Requested Pt call back and ask for this nurse.

## 2019-03-09 NOTE — Telephone Encounter (Signed)
Pt scheduled for afib ablation on March 16, 2019 at 11:00 am   Labs/covid test scheduled.  Pt will have labs on 03/12/19 and will plan on speaking with Pt at that time.  Pt states he has been taking Eliquis and has NOT missed any doses.  Work up complete

## 2019-03-12 ENCOUNTER — Other Ambulatory Visit: Payer: 59

## 2019-03-12 ENCOUNTER — Other Ambulatory Visit: Payer: Self-pay

## 2019-03-12 ENCOUNTER — Telehealth (HOSPITAL_COMMUNITY): Payer: Self-pay | Admitting: Emergency Medicine

## 2019-03-12 DIAGNOSIS — I4819 Other persistent atrial fibrillation: Secondary | ICD-10-CM

## 2019-03-12 LAB — CBC WITH DIFFERENTIAL/PLATELET
Basophils Absolute: 0 10*3/uL (ref 0.0–0.2)
Basos: 0 %
EOS (ABSOLUTE): 0.1 10*3/uL (ref 0.0–0.4)
Eos: 2 %
Hematocrit: 32.7 % — ABNORMAL LOW (ref 37.5–51.0)
Hemoglobin: 10.4 g/dL — ABNORMAL LOW (ref 13.0–17.7)
Lymphocytes Absolute: 1.3 10*3/uL (ref 0.7–3.1)
Lymphs: 22 %
MCH: 25.1 pg — ABNORMAL LOW (ref 26.6–33.0)
MCHC: 31.8 g/dL (ref 31.5–35.7)
MCV: 79 fL (ref 79–97)
Monocytes Absolute: 0.5 10*3/uL (ref 0.1–0.9)
Monocytes: 9 %
Neutrophils Absolute: 4.1 10*3/uL (ref 1.4–7.0)
Neutrophils: 67 %
Platelets: 288 10*3/uL (ref 150–450)
RBC: 4.14 x10E6/uL (ref 4.14–5.80)
RDW: 15.2 % (ref 11.6–15.4)
WBC: 6 10*3/uL (ref 3.4–10.8)

## 2019-03-12 LAB — BASIC METABOLIC PANEL
BUN/Creatinine Ratio: 11 (ref 10–24)
BUN: 20 mg/dL (ref 8–27)
CO2: 25 mmol/L (ref 20–29)
Calcium: 8.6 mg/dL (ref 8.6–10.2)
Chloride: 108 mmol/L — ABNORMAL HIGH (ref 96–106)
Creatinine, Ser: 1.79 mg/dL — ABNORMAL HIGH (ref 0.76–1.27)
GFR calc Af Amer: 46 mL/min/{1.73_m2} — ABNORMAL LOW (ref >59)
GFR calc non Af Amer: 39 mL/min/{1.73_m2} — ABNORMAL LOW (ref >59)
Glucose: 99 mg/dL (ref 65–99)
Potassium: 4.6 mmol/L (ref 3.5–5.2)
Sodium: 136 mmol/L (ref 134–144)

## 2019-03-12 NOTE — Telephone Encounter (Signed)
Reaching out to patient to offer assistance regarding upcoming cardiac imaging study; pt verbalizes understanding of appt date/time, parking situation and where to check in, pre-test NPO status and medications ordered, and verified current allergies; name and call back number provided for further questions should they arise Marchia Bond RN [facility] [phone] office [phone] cell   Pt verbalizes understanding of need for IV hydration prior/post contrast; verbalized understanding of medical day appt time and location to check in

## 2019-03-13 ENCOUNTER — Ambulatory Visit (HOSPITAL_COMMUNITY)
Admission: RE | Admit: 2019-03-13 | Discharge: 2019-03-13 | Disposition: A | Discharge status: Home / Self Care | Payer: 59 | Source: Ambulatory Visit | Attending: Internal Medicine | Admitting: Internal Medicine

## 2019-03-13 ENCOUNTER — Ambulatory Visit (HOSPITAL_COMMUNITY)
Admission: RE | Admit: 2019-03-13 | Discharge: 2019-03-13 | Disposition: A | Discharge status: Home / Self Care | Payer: 59 | Source: Ambulatory Visit | Attending: Cardiology | Admitting: Cardiology

## 2019-03-13 ENCOUNTER — Other Ambulatory Visit (HOSPITAL_COMMUNITY)
Admission: RE | Admit: 2019-03-13 | Discharge: 2019-03-13 | Disposition: A | Discharge status: Home / Self Care | Payer: 59 | Source: Ambulatory Visit | Attending: Internal Medicine | Admitting: Internal Medicine

## 2019-03-13 ENCOUNTER — Other Ambulatory Visit (HOSPITAL_COMMUNITY): Payer: Self-pay

## 2019-03-13 DIAGNOSIS — I2510 Atherosclerotic heart disease of native coronary artery without angina pectoris: Secondary | ICD-10-CM | POA: Insufficient documentation

## 2019-03-13 DIAGNOSIS — I4891 Unspecified atrial fibrillation: Secondary | ICD-10-CM | POA: Diagnosis not present

## 2019-03-13 DIAGNOSIS — I700 Atherosclerosis of aorta: Secondary | ICD-10-CM | POA: Insufficient documentation

## 2019-03-13 DIAGNOSIS — Z01812 Encounter for preprocedural laboratory examination: Secondary | ICD-10-CM | POA: Insufficient documentation

## 2019-03-13 DIAGNOSIS — Z20822 Contact with and (suspected) exposure to covid-19: Secondary | ICD-10-CM | POA: Diagnosis not present

## 2019-03-13 DIAGNOSIS — I712 Thoracic aortic aneurysm, without rupture: Secondary | ICD-10-CM | POA: Insufficient documentation

## 2019-03-13 LAB — BASIC METABOLIC PANEL
Anion gap: 7 (ref 5–15)
BUN: 18 mg/dL (ref 8–23)
CO2: 23 mmol/L (ref 22–32)
Calcium: 8.6 mg/dL — ABNORMAL LOW (ref 8.9–10.3)
Chloride: 109 mmol/L (ref 98–111)
Creatinine, Ser: 1.72 mg/dL — ABNORMAL HIGH (ref 0.61–1.24)
GFR calc Af Amer: 48 mL/min — ABNORMAL LOW (ref >60)
GFR calc non Af Amer: 41 mL/min — ABNORMAL LOW (ref >60)
Glucose, Bld: 85 mg/dL (ref 70–99)
Potassium: 4.2 mmol/L (ref 3.5–5.1)
Sodium: 139 mmol/L (ref 135–145)

## 2019-03-13 LAB — SARS CORONAVIRUS 2 (TAT 6-24 HRS): SARS Coronavirus 2: NEGATIVE

## 2019-03-13 MED ORDER — SODIUM CHLORIDE 0.9 % WEIGHT BASED INFUSION
3.0000 mL/kg/h | INTRAVENOUS | Status: AC
  Administered 2019-03-13: 11:00:00 3 mL/kg/h via INTRAVENOUS

## 2019-03-13 MED ORDER — IOHEXOL 350 MG/ML SOLN
80.0000 mL | Freq: Once | INTRAVENOUS | Status: AC | PRN
  Administered 2019-03-13: 80 mL via INTRAVENOUS

## 2019-03-13 MED ORDER — SODIUM CHLORIDE 0.9 % WEIGHT BASED INFUSION: 1.0000 mL/kg/h | INTRAVENOUS | Status: DC

## 2019-03-13 NOTE — Progress Notes (Signed)
Called and spoke with [place] in cardiac CT and  Let her know the patient will have completed his first hour of fluids at 1208 and is in room 2 in medical day

## 2019-03-15 NOTE — Progress Notes (Signed)
Instructed patient on the following items: Arrival time 0900 Nothing to eat or drink after midnight No meds AM of procedure Responsible person to drive you home and stay with you for 24 hrs  Have you missed any doses of anti-coagulant-No

## 2019-03-16 ENCOUNTER — Ambulatory Visit (HOSPITAL_COMMUNITY)
Admission: RE | Admit: 2019-03-16 | Discharge: 2019-03-16 | Disposition: A | Discharge status: Home / Self Care | Payer: 59 | Attending: Internal Medicine | Admitting: Internal Medicine

## 2019-03-16 ENCOUNTER — Other Ambulatory Visit: Payer: Self-pay

## 2019-03-16 ENCOUNTER — Encounter (HOSPITAL_COMMUNITY)
Admission: RE | Disposition: A | Discharge status: Home / Self Care | Payer: Self-pay | Source: Home / Self Care | Attending: Internal Medicine

## 2019-03-16 ENCOUNTER — Encounter (HOSPITAL_COMMUNITY): Payer: Self-pay | Admitting: Internal Medicine

## 2019-03-16 ENCOUNTER — Ambulatory Visit (HOSPITAL_COMMUNITY): Payer: 59 | Admitting: Anesthesiology

## 2019-03-16 DIAGNOSIS — I4819 Other persistent atrial fibrillation: Secondary | ICD-10-CM | POA: Insufficient documentation

## 2019-03-16 DIAGNOSIS — Z8249 Family history of ischemic heart disease and other diseases of the circulatory system: Secondary | ICD-10-CM | POA: Insufficient documentation

## 2019-03-16 DIAGNOSIS — Z888 Allergy status to other drugs, medicaments and biological substances status: Secondary | ICD-10-CM | POA: Insufficient documentation

## 2019-03-16 DIAGNOSIS — K219 Gastro-esophageal reflux disease without esophagitis: Secondary | ICD-10-CM | POA: Diagnosis not present

## 2019-03-16 DIAGNOSIS — Z6829 Body mass index (BMI) 29.0-29.9, adult: Secondary | ICD-10-CM | POA: Diagnosis not present

## 2019-03-16 DIAGNOSIS — G4730 Sleep apnea, unspecified: Secondary | ICD-10-CM | POA: Diagnosis not present

## 2019-03-16 DIAGNOSIS — Q211 Atrial septal defect: Secondary | ICD-10-CM | POA: Insufficient documentation

## 2019-03-16 DIAGNOSIS — Z7901 Long term (current) use of anticoagulants: Secondary | ICD-10-CM | POA: Diagnosis not present

## 2019-03-16 DIAGNOSIS — J45909 Unspecified asthma, uncomplicated: Secondary | ICD-10-CM | POA: Insufficient documentation

## 2019-03-16 DIAGNOSIS — I509 Heart failure, unspecified: Secondary | ICD-10-CM | POA: Diagnosis not present

## 2019-03-16 DIAGNOSIS — Z79899 Other long term (current) drug therapy: Secondary | ICD-10-CM | POA: Diagnosis not present

## 2019-03-16 DIAGNOSIS — Z9079 Acquired absence of other genital organ(s): Secondary | ICD-10-CM | POA: Insufficient documentation

## 2019-03-16 DIAGNOSIS — N400 Enlarged prostate without lower urinary tract symptoms: Secondary | ICD-10-CM | POA: Insufficient documentation

## 2019-03-16 DIAGNOSIS — Z886 Allergy status to analgesic agent status: Secondary | ICD-10-CM | POA: Insufficient documentation

## 2019-03-16 DIAGNOSIS — E669 Obesity, unspecified: Secondary | ICD-10-CM | POA: Diagnosis not present

## 2019-03-16 DIAGNOSIS — Z7951 Long term (current) use of inhaled steroids: Secondary | ICD-10-CM | POA: Insufficient documentation

## 2019-03-16 DIAGNOSIS — I110 Hypertensive heart disease with heart failure: Secondary | ICD-10-CM | POA: Diagnosis not present

## 2019-03-16 DIAGNOSIS — E782 Mixed hyperlipidemia: Secondary | ICD-10-CM | POA: Diagnosis not present

## 2019-03-16 HISTORY — PX: ATRIAL FIBRILLATION ABLATION: EP1191

## 2019-03-16 LAB — POCT ACTIVATED CLOTTING TIME
Activated Clotting Time: 213 seconds
Activated Clotting Time: 241 seconds

## 2019-03-16 SURGERY — ATRIAL FIBRILLATION ABLATION
Anesthesia: General

## 2019-03-16 MED ORDER — ONDANSETRON HCL 4 MG/2ML IJ SOLN: 4.0000 mg | Freq: Four times a day (QID) | INTRAMUSCULAR | Status: DC | PRN

## 2019-03-16 MED ORDER — SUGAMMADEX SODIUM 200 MG/2ML IV SOLN
INTRAVENOUS | Status: DC | PRN
  Administered 2019-03-16: 200 mg via INTRAVENOUS

## 2019-03-16 MED ORDER — MIDAZOLAM HCL 5 MG/5ML IJ SOLN
INTRAMUSCULAR | Status: DC | PRN
  Administered 2019-03-16: 2 mg via INTRAVENOUS

## 2019-03-16 MED ORDER — ACETAMINOPHEN 325 MG PO TABS: 650.0000 mg | ORAL_TABLET | ORAL | Status: DC | PRN

## 2019-03-16 MED ORDER — PROTAMINE SULFATE 10 MG/ML IV SOLN
INTRAVENOUS | Status: DC | PRN
  Administered 2019-03-16 (×2): 10 mg via INTRAVENOUS
  Administered 2019-03-16: 20 mg via INTRAVENOUS

## 2019-03-16 MED ORDER — SODIUM CHLORIDE 0.9 % IV SOLN: 250.0000 mL | INTRAVENOUS | Status: DC | PRN

## 2019-03-16 MED ORDER — PHENYLEPHRINE HCL-NACL 10-0.9 MG/250ML-% IV SOLN
INTRAVENOUS | Status: DC | PRN
  Administered 2019-03-16: 60 ug/min via INTRAVENOUS

## 2019-03-16 MED ORDER — ONDANSETRON HCL 4 MG/2ML IJ SOLN
INTRAMUSCULAR | Status: DC | PRN
  Administered 2019-03-16: 4 mg via INTRAVENOUS

## 2019-03-16 MED ORDER — HEPARIN (PORCINE) IN NACL 1000-0.9 UT/500ML-% IV SOLN
INTRAVENOUS | Status: DC | PRN
  Administered 2019-03-16: 500 mL

## 2019-03-16 MED ORDER — GLYCOPYRROLATE PF 0.2 MG/ML IJ SOSY
PREFILLED_SYRINGE | INTRAMUSCULAR | Status: DC | PRN
  Administered 2019-03-16: .2 mg via INTRAVENOUS

## 2019-03-16 MED ORDER — EPHEDRINE SULFATE-NACL 50-0.9 MG/10ML-% IV SOSY
PREFILLED_SYRINGE | INTRAVENOUS | Status: DC | PRN
  Administered 2019-03-16: 5 mg via INTRAVENOUS
  Administered 2019-03-16: 10 mg via INTRAVENOUS

## 2019-03-16 MED ORDER — SODIUM CHLORIDE 0.9 % IV SOLN: INTRAVENOUS | Status: DC

## 2019-03-16 MED ORDER — DEXAMETHASONE SODIUM PHOSPHATE 10 MG/ML IJ SOLN
INTRAMUSCULAR | Status: DC | PRN
  Administered 2019-03-16: 8 mg via INTRAVENOUS

## 2019-03-16 MED ORDER — HEPARIN SODIUM (PORCINE) 1000 UNIT/ML IJ SOLN
INTRAMUSCULAR | Status: AC
  Filled 2019-03-16: qty 2

## 2019-03-16 MED ORDER — ISOPROTERENOL HCL 0.2 MG/ML IJ SOLN
INTRAMUSCULAR | Status: AC
  Filled 2019-03-16: qty 5

## 2019-03-16 MED ORDER — HEPARIN SODIUM (PORCINE) 1000 UNIT/ML IJ SOLN
INTRAMUSCULAR | Status: DC | PRN
  Administered 2019-03-16: 14000 [IU] via INTRAVENOUS
  Administered 2019-03-16: 1000 [IU] via INTRAVENOUS

## 2019-03-16 MED ORDER — HEPARIN SODIUM (PORCINE) 1000 UNIT/ML IJ SOLN
INTRAMUSCULAR | Status: DC | PRN
  Administered 2019-03-16 (×2): 5000 [IU] via INTRAVENOUS

## 2019-03-16 MED ORDER — PROPOFOL 10 MG/ML IV BOLUS
INTRAVENOUS | Status: DC | PRN
  Administered 2019-03-16: 120 mg via INTRAVENOUS

## 2019-03-16 MED ORDER — HEPARIN (PORCINE) IN NACL 1000-0.9 UT/500ML-% IV SOLN
INTRAVENOUS | Status: AC
  Filled 2019-03-16: qty 500

## 2019-03-16 MED ORDER — SODIUM CHLORIDE 0.9% FLUSH: 3.0000 mL | Freq: Two times a day (BID) | INTRAVENOUS | Status: DC

## 2019-03-16 MED ORDER — FENTANYL CITRATE (PF) 250 MCG/5ML IJ SOLN
INTRAMUSCULAR | Status: DC | PRN
  Administered 2019-03-16 (×2): 50 ug via INTRAVENOUS

## 2019-03-16 MED ORDER — SODIUM CHLORIDE 0.9% FLUSH: 3.0000 mL | INTRAVENOUS | Status: DC | PRN

## 2019-03-16 MED ORDER — LIDOCAINE 2% (20 MG/ML) 5 ML SYRINGE
INTRAMUSCULAR | Status: DC | PRN
  Administered 2019-03-16: 100 mg via INTRAVENOUS

## 2019-03-16 MED ORDER — ISOPROTERENOL HCL 0.2 MG/ML IJ SOLN
INTRAVENOUS | Status: DC | PRN
  Administered 2019-03-16: 20 ug/min via INTRAVENOUS

## 2019-03-16 MED ORDER — HYDROCODONE-ACETAMINOPHEN 5-325 MG PO TABS: 1.0000 | ORAL_TABLET | ORAL | Status: DC | PRN

## 2019-03-16 MED ORDER — ROCURONIUM BROMIDE 10 MG/ML (PF) SYRINGE
PREFILLED_SYRINGE | INTRAVENOUS | Status: DC | PRN
  Administered 2019-03-16: 100 mg via INTRAVENOUS

## 2019-03-16 MED ORDER — SODIUM CHLORIDE 0.9 % IV SOLN: INTRAVENOUS | Status: DC | PRN

## 2019-03-16 SURGICAL SUPPLY — 20 items
BLANKET WARM UNDERBOD FULL ACC (MISCELLANEOUS) ×2 IMPLANT
CATH MAPPNG PENTARAY F 2-6-2MM (CATHETERS) ×1 IMPLANT
CATH SMTCH THERMOCOOL SF DF (CATHETERS) ×1 IMPLANT
CATH SOUNDSTAR ECO 8FR (CATHETERS) ×1 IMPLANT
CATH WEBSTER BI DIR CS D-F CRV (CATHETERS) ×1 IMPLANT
COVER SWIFTLINK CONNECTOR (BAG) ×2 IMPLANT
DEVICE CLOSURE PERCLS PRGLD 6F (VASCULAR PRODUCTS) ×3 IMPLANT
NDL BAYLIS TRANSSEPTAL 71CM (NEEDLE) IMPLANT
NEEDLE BAYLIS TRANSSEPTAL 71CM (NEEDLE) ×2 IMPLANT
PACK EP LATEX FREE (CUSTOM PROCEDURE TRAY) ×2
PACK EP LF (CUSTOM PROCEDURE TRAY) ×1 IMPLANT
PAD PRO RADIOLUCENT 2001M-C (PAD) ×2 IMPLANT
PATCH CARTO3 (PAD) ×1 IMPLANT
PENTARAY F 2-6-2MM (CATHETERS) ×2
PERCLOSE PROGLIDE 6F (VASCULAR PRODUCTS) ×6
SHEATH PINNACLE 7F 10CM (SHEATH) ×2 IMPLANT
SHEATH PINNACLE 9F 10CM (SHEATH) ×1 IMPLANT
SHEATH PROBE COVER 6X72 (BAG) ×1 IMPLANT
SHEATH SWARTZ TS SL2 63CM 8.5F (SHEATH) ×1 IMPLANT
TUBING SMART ABLATE COOLFLOW (TUBING) ×1 IMPLANT

## 2019-03-16 NOTE — Progress Notes (Signed)
Up and walked and tolerated well; right groin stable, no bleeding or hematoma 

## 2019-03-16 NOTE — Anesthesia Preprocedure Evaluation (Signed)
Anesthesia Evaluation  Patient identified by MRN, date of birth, ID band Patient awake    Reviewed: Allergy & Precautions, NPO status , Patient's Chart, lab work & pertinent test results  History of Anesthesia Complications Negative for: history of anesthetic complications  Airway Mallampati: II  TM Distance: >3 FB Neck ROM: Full    Dental no notable dental hx.    Pulmonary asthma , sleep apnea and Continuous Positive Airway Pressure Ventilation ,    Pulmonary exam normal        Cardiovascular hypertension, Pt. on medications +CHF  Normal cardiovascular exam+ dysrhythmias (on Eliquis) Atrial Fibrillation   EKG 10/16/2018: SB, RBBB  Echo 08/01/2018: EF 50%, LV diffuse hypokinesis, mild LAE, mild RAE   Neuro/Psych negative neurological ROS  negative psych ROS   GI/Hepatic Neg liver ROS, GERD  Medicated and Controlled,  Endo/Other  negative endocrine ROS  Renal/GU Renal InsufficiencyRenal disease  negative genitourinary   Musculoskeletal negative musculoskeletal ROS (+)   Abdominal   Peds  Hematology negative hematology ROS (+)   Anesthesia Other Findings Day of surgery medications reviewed with patient.  Reproductive/Obstetrics negative OB ROS                             Anesthesia Physical  Anesthesia Plan  ASA: III  Anesthesia Plan: General   Post-op Pain Management:    Induction: Intravenous  PONV Risk Score and Plan: 3 and Treatment may vary due to age or medical condition, Ondansetron, Dexamethasone and Midazolam  Airway Management Planned: Oral ETT  Additional Equipment:   Intra-op Plan:   Post-operative Plan: Extubation in OR  Informed Consent: I have reviewed the patients History and Physical, chart, labs and discussed the procedure including the risks, benefits and alternatives for the proposed anesthesia with the patient or authorized representative who has  indicated his/her understanding and acceptance.     Dental advisory given  Plan Discussed with: CRNA  Anesthesia Plan Comments: (See PAT note 12/20/2018, Konrad Felix, PA-C)        Anesthesia Quick Evaluation

## 2019-03-16 NOTE — Anesthesia Postprocedure Evaluation (Signed)
Anesthesia Post Note  Patient: [name]  Procedure(s) Performed: ATRIAL FIBRILLATION ABLATION (N/A )     Patient location during evaluation: PACU Anesthesia Type: General Level of consciousness: awake and alert Pain management: pain level controlled Vital Signs Assessment: post-procedure vital signs reviewed and stable Respiratory status: spontaneous breathing, nonlabored ventilation and respiratory function stable Cardiovascular status: blood pressure returned to baseline and stable Postop Assessment: no apparent nausea or vomiting Anesthetic complications: no    Last Vitals:  Vitals:   03/16/19 1445 03/16/19 1500  BP: (!) 148/80 (!) 150/82  Pulse: (!) 51 (!) 51  Resp: 11 18  Temp:    SpO2: 95% 93%    Last Pain:  Vitals:   03/16/19 1434  TempSrc: Temporal  PainSc:                  [name]

## 2019-03-16 NOTE — Anesthesia Procedure Notes (Signed)
Procedure Name: Intubation Date/Time: 03/16/2019 11:41 AM Performed by: Renato Shin, CRNA Pre-anesthesia Checklist: Patient identified, Emergency Drugs available, Suction available and Patient being monitored Patient Re-evaluated:Patient Re-evaluated prior to induction Oxygen Delivery Method: Circle system utilized Preoxygenation: Pre-oxygenation with 100% oxygen Induction Type: IV induction Ventilation: Mask ventilation without difficulty Laryngoscope Size: Miller and 2 Grade View: Grade I Tube type: Oral Tube size: 7.5 mm Number of attempts: 1 Airway Equipment and Method: Stylet and Oral airway Placement Confirmation: ETT inserted through vocal cords under direct vision,  positive ETCO2 and breath sounds checked- equal and bilateral Secured at: 22 cm Tube secured with: Tape Dental Injury: Teeth and Oropharynx as per pre-operative assessment

## 2019-03-16 NOTE — Discharge Instructions (Signed)
Post procedure care instructions No driving for 4 days. No lifting over 5 lbs for 1 week. No vigorous or sexual activity for 1 week. You may return to work/your usual activities on 03/23/2019. Keep procedure site clean & dry. If you notice increased pain, swelling, bleeding or pus, call/return!  You may shower, but no soaking baths/hot tubs/pools for 1 week.     Cardiac Ablation, Care After  This sheet gives you information about how to care for yourself after your procedure. Your health care provider may also give you more specific instructions. If you have problems or questions, contact your health care provider. What can I expect after the procedure? After the procedure, it is common to have:  Bruising around your puncture site.  Tenderness around your puncture site.  Skipped heartbeats.  Tiredness (fatigue).  Follow these instructions at home: Puncture site care   Follow instructions from your health care provider about how to take care of your puncture site. Make sure you: ? If present, leave stitches (sutures), skin glue, or adhesive strips in place. These skin closures may need to stay in place for up to 2 weeks. If adhesive strip edges start to loosen and curl up, you may trim the loose edges. Do not remove adhesive strips completely unless your health care provider tells you to do that.  Check your puncture site every day for signs of infection. Check for: ? Redness, swelling, or pain. ? Fluid or blood. If your puncture site starts to bleed, lie down on your back, apply firm pressure to the area, and contact your health care provider. ? Warmth. ? Pus or a bad smell. Driving  Do not drive for at least 4 days after your procedure or however long your health care provider recommends. (Do not resume driving if you have previously been instructed not to drive for other health reasons.)  Do not drive or use heavy machinery while taking prescription pain  medicine. Activity  Avoid activities that take a lot of effort for at least 7 days after your procedure.  Do not lift anything that is heavier than 5 lb (4.5 kg) for one week.   No sexual activity for 1 week.   Return to your normal activities as told by your health care provider. Ask your health care provider what activities are safe for you. General instructions  Take over-the-counter and prescription medicines only as told by your health care provider.  Do not use any products that contain nicotine or tobacco, such as cigarettes and e-cigarettes. If you need help quitting, ask your health care provider.  You may shower after 24 hours, but Do not take baths, swim, or use a hot tub for 1 week.   Do not drink alcohol for 24 hours after your procedure.  Keep all follow-up visits as told by your health care provider. This is important. Contact a health care provider if:  You have redness, mild swelling, or pain around your puncture site.  You have fluid or blood coming from your puncture site that stops after applying firm pressure to the area.  Your puncture site feels warm to the touch.  You have pus or a bad smell coming from your puncture site.  You have a fever.  You have chest pain or discomfort that spreads to your neck, jaw, or arm.  You are sweating a lot.  You feel nauseous.  You have a fast or irregular heartbeat.  You have shortness of breath.  You are dizzy  or light-headed and feel the need to lie down.  You have pain or numbness in the arm or leg closest to your puncture site. Get help right away if:   Your puncture site suddenly swells.  Your puncture site is bleeding and the bleeding does not stop after applying firm pressure to the area. These symptoms may represent a serious problem that is an emergency. Do not wait to see if the symptoms will go away. Get medical help right away. Call your local emergency services (911 in the [place] Do not drive  yourself to the hospital. Summary  After the procedure, it is normal to have bruising and tenderness at the puncture site in your groin, neck, or forearm.  Check your puncture site every day for signs of infection.  Get help right away if your puncture site is bleeding and the bleeding does not stop after applying firm pressure to the area. This is a medical emergency. This information is not intended to replace advice given to you by your health care provider. Make sure you discuss any questions you have with your health care provider.

## 2019-03-16 NOTE — Transfer of Care (Signed)
Immediate Anesthesia Transfer of Care Note  Patient: [name]  Procedure(s) Performed: ATRIAL FIBRILLATION ABLATION (N/A )  Patient Location: PACU and Cath Lab  Anesthesia Type:General  Level of Consciousness: awake, alert  and patient cooperative  Airway & Oxygen Therapy: Patient Spontanous Breathing and Patient connected to nasal cannula oxygen  Post-op Assessment: Report given to RN and Post -op Vital signs reviewed and stable  Post vital signs: Reviewed and stable  Last Vitals:  Vitals Value Taken Time  BP 160/91 03/16/19 1406  Temp 36.9 C 03/16/19 1406  Pulse 56 03/16/19 1409  Resp 13 03/16/19 1409  SpO2 98 % 03/16/19 1409  Vitals shown include unvalidated device data.  Last Pain:  Vitals:   03/16/19 1406  TempSrc: Temporal  PainSc: 0-No pain         Complications: No apparent anesthesia complications

## 2019-03-16 NOTE — H&P (Signed)
Chief Complaint:  palpitations  History of Present Illness:    [name] is a 64 y.o. male who presents for afib ablation.  Since last being seen in our clinic, the patient reports doing very well.  He developed kidney stones and hematuria requiring extensive urologic surgery.  He has been placed back on eliquis without further hematuria.  No afib events.  Today, he denies symptoms of palpitations, chest pain, shortness of breath,  lower extremity edema, dizziness, presyncope, or syncope.  The patient is otherwise without complaint today.  The patient denies symptoms of fevers, chills, cough, or new SOB worrisome for COVID 19.      Past Medical History:  Diagnosis Date  . Asthma, persistent   . BPH (benign prostatic hyperplasia) 2014  . CHF (congestive heart failure) ([place])   . Combined hyperlipidemia   . Dysrhythmia    ATRIAL FIBRILLATION  . GERD (gastroesophageal reflux disease)   . History of kidney stones   . Hypertension   . 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 Obesity   . Persistent atrial fibrillation ([place])   . Renal stone 04/2012  . Seasonal allergic rhinitis   . Sleep apnea    does not use a cpap machine         Past Surgical History:  Procedure Laterality Date  . CARDIOVERSION N/A 04/23/2016   Procedure: CARDIOVERSION;  Surgeon: Jolaine Artist, MD;  Location: [facility] ENDOSCOPY;  Service: Cardiovascular;  Laterality: N/A;  . CYSTOSCOPY WITH RETROGRADE PYELOGRAM, URETEROSCOPY AND STENT PLACEMENT Left 07/29/2017   Procedure: CYSTOSCOPY WITH RETROGRADE PYELOGRAM AND LEFT STENT PLACEMENT;  Surgeon: Franchot Gallo, MD;  Location: [facility] ORS;  Service: Urology;  Laterality: Left;  . EXTRACORPOREAL SHOCK WAVE LITHOTRIPSY Left 08/08/2017   Procedure: LEFT EXTRACORPOREAL SHOCK WAVE LITHOTRIPSY (ESWL);  Surgeon: Nickie Retort, MD;  Location: [facility] ORS;  Service: Urology;  Laterality: Left;  . TEE WITHOUT CARDIOVERSION N/A 04/23/2016   Procedure:  TRANSESOPHAGEAL ECHOCARDIOGRAM (TEE);  Surgeon: Jolaine Artist, MD;  Location: [facility] ENDOSCOPY;  Service: Cardiovascular;  Laterality: N/A;  . XI ROBOTIC ASSISTED SIMPLE PROSTATECTOMY N/A 12/25/2018   Procedure: XI ROBOTIC ASSISTED SIMPLE PROSTATECTOMY, REMOVAL OF BLADDER STONES;  Surgeon: Cleon Gustin, MD;  Location: [facility] ORS;  Service: Urology;  Laterality: N/A;          Current Outpatient Medications  Medication Sig Dispense Refill  . acetaminophen (TYLENOL) 325 MG tablet Take 2 tablets (650 mg total) by mouth every 6 (six) hours as needed for mild pain or headache.    . albuterol (PROVENTIL HFA;VENTOLIN HFA) 108 (90 Base) MCG/ACT inhaler Inhale 2 puffs into the lungs every 6 (six) hours as needed for wheezing or shortness of breath.    Marland Kitchen amiodarone (PACERONE) 400 MG tablet Take 200 mg by mouth daily.     Marland Kitchen apixaban (ELIQUIS) 5 MG TABS tablet Take 1 tablet (5 mg total) by mouth 2 (two) times daily. 60 tablet 5  . Fluticasone-Salmeterol (ADVAIR) 250-50 MCG/DOSE AEPB Inhale 1 puff into the lungs 2 (two) times daily.     . furosemide (LASIX) 40 MG tablet Take 40 mg by mouth daily.     Marland Kitchen oxybutynin (DITROPAN-XL) 5 MG 24 hr tablet TAKE 1 TABLET BY MOUTH EVERY DAY 30 tablet 1  . pantoprazole (PROTONIX) 40 MG tablet Take 40 mg by mouth daily.    . potassium chloride (K-DUR) 10 MEQ tablet Take 20-30 mEq by mouth See admin instructions. Take 30 meq by mouth in the  morning and 20 meq in the evening.    . potassium chloride (KLOR-CON) 10 MEQ tablet Take 20-30 mEq by mouth See admin instructions. Take 30 meq by mouth in the morning and 20 meq in the evening. 360 tablet 1  . sacubitril-valsartan (ENTRESTO) 97-103 MG Take 1 tablet by mouth 2 (two) times daily. 180 tablet 3  . tadalafil (CIALIS) 10 MG tablet Take 10 mg by mouth daily as needed for erectile dysfunction.     No current facility-administered medications for this visit.    Allergies:   Ibuprofen, Isosorbide,  Lisinopril, and Digoxin and related   Social History:  The patient  reports that he has never smoked. He has never used smokeless tobacco. He reports that he does not drink alcohol or use drugs.   Family History:  The patient's family history includes Hypertension in his mother.   ROS:  Please see the history of present illness.   All other systems are personally reviewed and negative.    Exam:    Vital Signs:  Ht 6' (1.829 m)   Wt 220 lb (99.8 kg)   BMI 29.84 kg/m   Well sounding, alert and conversant   Labs/Other Tests and Data Reviewed:    Recent Labs: 08/01/2018: ALT 19 10/16/2018: B Natriuretic Peptide 243.9 12/20/2018: Platelets 261 12/26/2018: BUN 23; Hemoglobin 10.8; Potassium 4.5; Sodium 135 12/27/2018: Creatinine, Ser 1.74      Wt Readings from Last 3 Encounters:  01/29/19 220 lb (99.8 kg)  01/12/19 215 lb (97.5 kg)  12/25/18 219 lb 9.3 oz (99.6 kg)      ASSESSMENT & PLAN:    1.  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 He has failed medical therapy with amiodarone.   He reports compliance with eliquis without interruption or hematuria. Cardiac CT reviewed with the patient today.   Risk, benefits, and alternatives to EP study and radiofrequency ablation for afib were also discussed in detail today. These risks include but are not limited to stroke, bleeding, vascular damage, tamponade, perforation, damage to the esophagus, lungs, and other structures, pulmonary vein stenosis, worsening renal function, and death. The patient understands these risk and wishes to proceed.     Thompson Grayer MD, [facility] 03/16/2019 9:26 AM

## 2019-03-16 NOTE — Progress Notes (Signed)
Dr Rayann Heman in and ok to d/c home at 1800

## 2019-04-16 ENCOUNTER — Encounter (HOSPITAL_COMMUNITY): Payer: Self-pay | Admitting: Physician Assistant

## 2019-04-16 ENCOUNTER — Other Ambulatory Visit: Payer: Self-pay

## 2019-04-16 ENCOUNTER — Ambulatory Visit (HOSPITAL_COMMUNITY)
Admission: RE | Admit: 2019-04-16 | Discharge: 2019-04-16 | Disposition: A | Discharge status: Home / Self Care | Payer: 59 | Source: Ambulatory Visit | Attending: Physician Assistant | Admitting: Physician Assistant

## 2019-04-16 VITALS — BP 118/72 | HR 58 | Ht 72.0 in | Wt 218.8 lb

## 2019-04-16 DIAGNOSIS — I509 Heart failure, unspecified: Secondary | ICD-10-CM | POA: Insufficient documentation

## 2019-04-16 DIAGNOSIS — Z79899 Other long term (current) drug therapy: Secondary | ICD-10-CM | POA: Diagnosis not present

## 2019-04-16 DIAGNOSIS — Z7901 Long term (current) use of anticoagulants: Secondary | ICD-10-CM | POA: Diagnosis not present

## 2019-04-16 DIAGNOSIS — Z7951 Long term (current) use of inhaled steroids: Secondary | ICD-10-CM | POA: Diagnosis not present

## 2019-04-16 DIAGNOSIS — Z87442 Personal history of urinary calculi: Secondary | ICD-10-CM | POA: Insufficient documentation

## 2019-04-16 DIAGNOSIS — Z886 Allergy status to analgesic agent status: Secondary | ICD-10-CM | POA: Insufficient documentation

## 2019-04-16 DIAGNOSIS — J45909 Unspecified asthma, uncomplicated: Secondary | ICD-10-CM | POA: Insufficient documentation

## 2019-04-16 DIAGNOSIS — R000 Tachycardia, unspecified: Secondary | ICD-10-CM | POA: Diagnosis not present

## 2019-04-16 DIAGNOSIS — I130 Hypertensive heart and chronic kidney disease with heart failure and stage 1 through stage 4 chronic kidney disease, or unspecified chronic kidney disease: Secondary | ICD-10-CM | POA: Diagnosis not present

## 2019-04-16 DIAGNOSIS — G4733 Obstructive sleep apnea (adult) (pediatric): Secondary | ICD-10-CM | POA: Insufficient documentation

## 2019-04-16 DIAGNOSIS — I4891 Unspecified atrial fibrillation: Secondary | ICD-10-CM | POA: Diagnosis present

## 2019-04-16 DIAGNOSIS — N189 Chronic kidney disease, unspecified: Secondary | ICD-10-CM | POA: Diagnosis not present

## 2019-04-16 DIAGNOSIS — D6869 Other thrombophilia: Secondary | ICD-10-CM | POA: Insufficient documentation

## 2019-04-16 DIAGNOSIS — K219 Gastro-esophageal reflux disease without esophagitis: Secondary | ICD-10-CM | POA: Insufficient documentation

## 2019-04-16 DIAGNOSIS — Z888 Allergy status to other drugs, medicaments and biological substances status: Secondary | ICD-10-CM | POA: Diagnosis not present

## 2019-04-16 DIAGNOSIS — Z8249 Family history of ischemic heart disease and other diseases of the circulatory system: Secondary | ICD-10-CM | POA: Insufficient documentation

## 2019-04-16 DIAGNOSIS — I4819 Other persistent atrial fibrillation: Secondary | ICD-10-CM | POA: Insufficient documentation

## 2019-04-16 DIAGNOSIS — E782 Mixed hyperlipidemia: Secondary | ICD-10-CM | POA: Insufficient documentation

## 2019-04-16 NOTE — Progress Notes (Signed)
Primary Care Physician: [name], MD Primary Cardiologist: Dr Johnsie Cancel Primary Electrophysiologist: Dr Rayann Heman [facility]: Dr Haroldine Laws Referring Physician: Dr Rayann Heman   [name] is a 64 y.o. male with a history of persistent atrial fibrillation, tachycardia mediated CM, CKD, HTN, OSA who presents for follow up in the [facility].  The patient was initially diagnosed with atrial fibrillation 2018 after presenting with symptoms of SOB and fatigue. 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  He initially failed cardioversion and was placed on amiodarone.  With rhythm control, his EF has improved from 20% to 50%.  He did well for 2 years with low dose amiodarone.  (amiodarone had been reduced due to sinus bradycardia). Unfortunately, he continued to have episodes of afib. He underwent afib ablation on 03/16/19 with Dr Rayann Heman. Patient is on Eliquis for a CHADS2VASC score of 2. He reports he has done well since the procedure with no irregular heart rates noted. He denies CP, swallowing or groin issues.   Today, he denies symptoms of palpitations, chest pain, shortness of breath, orthopnea, PND, lower extremity edema, dizziness, presyncope, syncope, snoring, daytime somnolence, bleeding, or neurologic sequela. The patient is tolerating medications without difficulties and is otherwise without complaint today.    Atrial Fibrillation Risk Factors:  he does have symptoms or diagnosis of sleep apnea. he is compliant with CPAP therapy.   he has a BMI of Body mass index is 29.67 kg/m.Marland Kitchen Filed Weights   04/16/19 1526  Weight: 99.2 kg    Family History  Problem Relation Age of Onset  . Hypertension Mother      Atrial Fibrillation Management history:  Previous antiarrhythmic drugs: amiodarone Previous cardioversions: 03/2016 Previous ablations: 03/16/19 CHADS2VASC score: 2 Anticoagulation history: Eliquis   Past Medical History:  Diagnosis Date  . Asthma,  persistent   . BPH (benign prostatic hyperplasia) 2014  . CHF (congestive heart failure) ([place])   . Combined hyperlipidemia   . Dysrhythmia    ATRIAL FIBRILLATION  . GERD (gastroesophageal reflux disease)   . History of kidney stones   . Hypertension   . 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 Obesity   . Persistent atrial fibrillation ([place])   . Renal stone 04/2012  . Seasonal allergic rhinitis   . Sleep apnea    does not use a cpap machine   Past Surgical History:  Procedure Laterality Date  . ATRIAL FIBRILLATION ABLATION N/A 03/16/2019   Procedure: ATRIAL FIBRILLATION ABLATION;  Surgeon: Thompson Grayer, MD;  Location: [facility] CV LAB;  Service: Cardiovascular;  Laterality: N/A;  . CARDIOVERSION N/A 04/23/2016   Procedure: CARDIOVERSION;  Surgeon: Jolaine Artist, MD;  Location: [facility] ENDOSCOPY;  Service: Cardiovascular;  Laterality: N/A;  . CYSTOSCOPY WITH RETROGRADE PYELOGRAM, URETEROSCOPY AND STENT PLACEMENT Left 07/29/2017   Procedure: CYSTOSCOPY WITH RETROGRADE PYELOGRAM AND LEFT STENT PLACEMENT;  Surgeon: Franchot Gallo, MD;  [facility]S;  Service: Urology;  Laterality: Left;  . EXTRACORPOREAL SHOCK WAVE LITHOTRIPSY Left 08/08/2017   Procedure: LEFT EXTRACORPOREAL SHOCK WAVE LITHOTRIPSY (ESWL);  Surgeon: Nickie Retort, MD;  Location: [facility] ORS;  Service: Urology;  Laterality: Left;  . TEE WITHOUT CARDIOVERSION N/A 04/23/2016   Procedure: TRANSESOPHAGEAL ECHOCARDIOGRAM (TEE);  Surgeon: Jolaine Artist, MD;  Location: [facility];  Service: Cardiovascular;  Laterality: N/A;  . XI ROBOTIC ASSISTED SIMPLE PROSTATECTOMY N/A 12/25/2018   Procedure: XI ROBOTIC ASSISTED SIMPLE PROSTATECTOMY, REMOVAL OF BLADDER STONES;  Surgeon: Cleon Gustin, MD;  Location: [facility];  Service: Urology;  Laterality: N/A;    Current Outpatient Medications  Medication Sig Dispense Refill  . acetaminophen (TYLENOL) 325 MG tablet Take 2 tablets (650 mg total) by mouth every 6 (six)  hours as needed for mild pain or headache.    . albuterol (PROVENTIL HFA;VENTOLIN HFA) 108 (90 Base) MCG/ACT inhaler Inhale 2 puffs into the lungs every 6 (six) hours as needed for wheezing or shortness of breath.    Marland Kitchen apixaban (ELIQUIS) 5 MG TABS tablet Take 1 tablet (5 mg total) by mouth 2 (two) times daily. 60 tablet 5  . Fluticasone-Salmeterol (ADVAIR) 250-50 MCG/DOSE AEPB Inhale 1 puff into the lungs 2 (two) times daily.     . furosemide (LASIX) 40 MG tablet Take 40 mg by mouth daily as needed for fluid or edema.     . pantoprazole (PROTONIX) 40 MG tablet Take 40 mg by mouth daily.    . potassium chloride (KLOR-CON) 10 MEQ tablet Take 20-30 mEq by mouth See admin instructions. Take 30 meq by mouth in the morning and 20 meq in the evening. (Patient taking differently: Take 20 mEq by mouth 2 (two) times daily. ) 360 tablet 1  . sacubitril-valsartan (ENTRESTO) 97-103 MG Take 1 tablet by mouth 2 (two) times daily. 180 tablet 3  . tadalafil (CIALIS) 5 MG tablet Take 5 mg by mouth daily.      No current facility-administered medications for this encounter.    Allergies  Allergen Reactions  . Ibuprofen Other (See Comments)    Has a-Fib, unable to take  . Isosorbide Other (See Comments)    HEADACHES from Imdur  . Lisinopril Cough  . Digoxin And Related Rash    Social History   Socioeconomic History  . Marital status: Married    Spouse name: Not on file  . Number of children: Not on file  . Years of education: Not on file  . Highest education level: Not on file  Occupational History  . Occupation: GREENHOUSE MANAGEMENT  Tobacco Use  . Smoking status: Never Smoker  . Smokeless tobacco: Never Used  Substance and Sexual Activity  . Alcohol use: No  . Drug use: No  . Sexual activity: Not on file  Other Topics Concern  . Not on file  Social History Narrative   Lives in [place] with his spouse   He works as a Research scientist (life sciences)  Strain:   . Difficulty of Paying Living Expenses:   Food Insecurity:   . Worried About Charity fundraiser in the Last Year:   . Arboriculturist in the Last Year:   Transportation Needs:   . Film/video editor (Medical):   Marland Kitchen Lack of Transportation (Non-Medical):   Physical Activity:   . Days of Exercise per Week:   . Minutes of Exercise per Session:   Stress:   . Feeling of Stress :   Social Connections:   . Frequency of Communication with Friends and Family:   . Frequency of Social Gatherings with Friends and Family:   . Attends Religious Services:   . Active Member of Clubs or Organizations:   . Attends Archivist Meetings:   Marland Kitchen Marital Status:   Intimate Partner Violence:   . Fear of Current or Ex-Partner:   . Emotionally Abused:   Marland Kitchen Physically Abused:   . Sexually Abused:      ROS- All systems are reviewed and negative except  as per the HPI above.  Physical Exam: Vitals:   04/16/19 1526  BP: 118/72  Pulse: (!) 58  Weight: 99.2 kg  Height: 6' (1.829 m)    GEN- The patient is well appearing, alert and oriented x 3 today.   Head- normocephalic, atraumatic Eyes-  Sclera clear, conjunctiva pink Ears- hearing intact Oropharynx- clear Neck- supple  Lungs- Clear to ausculation bilaterally, normal work of breathing Heart- Regular rate and rhythm, no murmurs, rubs or gallops  GI- soft, NT, ND, + BS Extremities- no clubbing, cyanosis, or edema MS- no significant deformity or atrophy Skin- no rash or lesion Psych- euthymic mood, full affect Neuro- strength and sensation are intact  Wt Readings from Last 3 Encounters:  04/16/19 99.2 kg  03/16/19 102.1 kg  03/13/19 102.1 kg    EKG today demonstrates SB HR 58, RBBB, PR 156, QRS 152, QTc 479  Echo 08/01/18 demonstrated  1. The left ventricle has a visually estimated ejection fraction of 50%.  The cavity size was normal. Left ventricular diastolic Doppler parameters  are consistent with impaired  relaxation. Left ventricular diffuse  hypokinesis.  2. The right ventricle has normal systolic function. The cavity was  normal. There is no increase in right ventricular wall thickness.  3. Left atrial size was mildly dilated.  4. Right atrial size was mildly dilated.  5. No evidence of mitral valve stenosis. Trivial mitral regurgitation.  6. The aortic valve is tricuspid. Aortic valve regurgitation is trivial  by color flow Doppler. No stenosis of the aortic valve.  7. The aortic root is normal in size and structure.  8. The inferior vena cava was dilated in size with >50% respiratory  variability. PA systolic pressure 32 mmHg.   Epic records are reviewed at length today  CHA2DS2-VASc Score = 2 The patient's score is based upon: CHF History: Yes HTN History: Yes Age : < 65 Diabetes History: No Stroke History: No Vascular Disease History: No Gender: Male      ASSESSMENT AND PLAN: 1. Persistent Atrial Fibrillation (ICD10:  I48.19) The patient's CHA2DS2-VASc score is 2, indicating a 2.2% annual risk of stroke.   S/p afib ablation with Dr Rayann Heman 03/16/19. Patient appears to be maintaining SR off amiodarone.  Continue Eliquis 5 mg BID with no missed doses for at least 3 months post ablation.  2. Secondary Hypercoagulable State (ICD10:  D68.69) The patient is at significant risk for stroke/thromboembolism based upon his CHA2DS2-VASc Score of 2.  Continue Apixaban (Eliquis).   3. Obstructive sleep apnea The importance of adequate treatment of sleep apnea was discussed today in order to improve our ability to maintain sinus rhythm long term. Patient reports compliance with CPAP therapy.  4. HTN Stable, no changes today.  5. Tachycardia mediated CM EF normal on last echo.   Follow up with Dr Rayann Heman as scheduled.    [facility] [address] [place], [place] [address] [phone] 04/16/2019 3:53 PM

## 2019-04-20 ENCOUNTER — Telehealth (HOSPITAL_COMMUNITY): Payer: Self-pay | Admitting: Pharmacist

## 2019-04-20 NOTE — Telephone Encounter (Signed)
Patient Advocate Encounter   Received notification from [facility] that prior authorization for [name] is required.   PA submitted via fax.  Status is pending   Will continue to follow.  Audry Riles, PharmD, BCPS, BCCP, CPP Heart Failure Clinic Pharmacist [phone]

## 2019-04-23 NOTE — Telephone Encounter (Signed)
Advanced Heart Failure Patient Advocate Encounter  Prior Authorization for [name] has been approved.    Effective dates: 04/20/19 through 04/19/20  Audry Riles, PharmD, BCPS, BCCP, CPP Heart Failure Clinic Pharmacist [phone]

## 2019-05-10 ENCOUNTER — Telehealth (HOSPITAL_COMMUNITY): Payer: Self-pay | Admitting: *Deleted

## 2019-05-10 NOTE — Telephone Encounter (Signed)
Pt left VM on triage line stating he thinks he is back in afib and would like a call from the nurse to see about getting an asap appt with the afib clinic. I gave the message to Leona Carry in the [place] [facility] to give to their RN Rushie Goltz.

## 2019-05-10 NOTE — Telephone Encounter (Signed)
Patient states he just feels like his heart is back in AF according to his BP machine. HR in the 60s. Per Adline Peals PA will bring in for assessment. Pt in agreement.

## 2019-05-11 ENCOUNTER — Other Ambulatory Visit: Payer: Self-pay

## 2019-05-11 ENCOUNTER — Ambulatory Visit (HOSPITAL_COMMUNITY)
Admission: RE | Admit: 2019-05-11 | Discharge: 2019-05-11 | Disposition: A | Discharge status: Home / Self Care | Payer: 59 | Source: Ambulatory Visit | Attending: Physician Assistant | Admitting: Physician Assistant

## 2019-05-11 ENCOUNTER — Encounter (HOSPITAL_COMMUNITY): Payer: Self-pay | Admitting: Physician Assistant

## 2019-05-11 VITALS — BP 130/80 | HR 49 | Ht 72.0 in | Wt 218.6 lb

## 2019-05-11 DIAGNOSIS — Z8249 Family history of ischemic heart disease and other diseases of the circulatory system: Secondary | ICD-10-CM | POA: Diagnosis not present

## 2019-05-11 DIAGNOSIS — D6869 Other thrombophilia: Secondary | ICD-10-CM | POA: Diagnosis not present

## 2019-05-11 DIAGNOSIS — I509 Heart failure, unspecified: Secondary | ICD-10-CM | POA: Diagnosis not present

## 2019-05-11 DIAGNOSIS — G4733 Obstructive sleep apnea (adult) (pediatric): Secondary | ICD-10-CM | POA: Diagnosis not present

## 2019-05-11 DIAGNOSIS — I130 Hypertensive heart and chronic kidney disease with heart failure and stage 1 through stage 4 chronic kidney disease, or unspecified chronic kidney disease: Secondary | ICD-10-CM | POA: Insufficient documentation

## 2019-05-11 DIAGNOSIS — I4819 Other persistent atrial fibrillation: Secondary | ICD-10-CM

## 2019-05-11 DIAGNOSIS — Z886 Allergy status to analgesic agent status: Secondary | ICD-10-CM | POA: Insufficient documentation

## 2019-05-11 DIAGNOSIS — Z7901 Long term (current) use of anticoagulants: Secondary | ICD-10-CM | POA: Diagnosis not present

## 2019-05-11 DIAGNOSIS — E782 Mixed hyperlipidemia: Secondary | ICD-10-CM | POA: Insufficient documentation

## 2019-05-11 DIAGNOSIS — Z888 Allergy status to other drugs, medicaments and biological substances status: Secondary | ICD-10-CM | POA: Diagnosis not present

## 2019-05-11 DIAGNOSIS — N189 Chronic kidney disease, unspecified: Secondary | ICD-10-CM | POA: Insufficient documentation

## 2019-05-11 DIAGNOSIS — Z79899 Other long term (current) drug therapy: Secondary | ICD-10-CM | POA: Insufficient documentation

## 2019-05-11 NOTE — Progress Notes (Signed)
Primary Care Physician: [name], MD Primary Cardiologist: Dr Johnsie Cancel Primary Electrophysiologist: Dr Rayann Heman [facility]: Dr Haroldine Laws Referring Physician: Dr Rayann Heman   [name] is a 64 y.o. male with a history of persistent atrial fibrillation, tachycardia mediated CM, CKD, HTN, OSA who presents for follow up in the [facility].  The patient was initially diagnosed with atrial fibrillation 2018 after presenting with symptoms of SOB and fatigue. 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  He initially failed cardioversion and was placed on amiodarone.  With rhythm control, his EF has improved from 20% to 50%.  He did well for 2 years with low dose amiodarone.  (amiodarone had been reduced due to sinus bradycardia). Unfortunately, he continued to have episodes of afib. He underwent afib ablation on 03/16/19 with Dr Rayann Heman. Patient is on Eliquis for a CHADS2VASC score of 2.   On follow up today, patient reports that he noted an irregular pulse 05/10/19 and was worried he was back in afib. He denies symptoms of SOB, dizziness, or presyncope. He denies any missed doses of anticoagulation.   Today, he denies symptoms of chest pain, shortness of breath, orthopnea, PND, lower extremity edema, dizziness, presyncope, syncope, snoring, daytime somnolence, bleeding, or neurologic sequela. The patient is tolerating medications without difficulties and is otherwise without complaint today.    Atrial Fibrillation Risk Factors:  he does have symptoms or diagnosis of sleep apnea. he is compliant with CPAP therapy.   he has a BMI of Body mass index is 29.65 kg/m.Marland Kitchen Filed Weights   05/11/19 1154  Weight: 99.2 kg    Family History  Problem Relation Age of Onset  . Hypertension Mother      Atrial Fibrillation Management history:  Previous antiarrhythmic drugs: amiodarone Previous cardioversions: 03/2016 Previous ablations: 03/16/19 CHADS2VASC score:  2 Anticoagulation history: Eliquis   Past Medical History:  Diagnosis Date  . Asthma, persistent   . BPH (benign prostatic hyperplasia) 2014  . CHF (congestive heart failure) ([place])   . Combined hyperlipidemia   . Dysrhythmia    ATRIAL FIBRILLATION  . GERD (gastroesophageal reflux disease)   . History of kidney stones   . Hypertension   . 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 Obesity   . Persistent atrial fibrillation ([place])   . Renal stone 04/2012  . Seasonal allergic rhinitis   . Sleep apnea    does not use a cpap machine   Past Surgical History:  Procedure Laterality Date  . ATRIAL FIBRILLATION ABLATION N/A 03/16/2019   Procedure: ATRIAL FIBRILLATION ABLATION;  Surgeon: Thompson Grayer, MD;  Location: [facility];  Service: Cardiovascular;  Laterality: N/A;  . CARDIOVERSION N/A 04/23/2016   Procedure: CARDIOVERSION;  Surgeon: Jolaine Artist, MD;  Location[facility] ENDOSCOPY;  Service: Cardiovascular;  Laterality: N/A;  . CYSTOSCOPY WITH RETROGRADE PYELOGRAM, URETEROSCOPY AND STENT PLACEMENT Left 07/29/2017   Procedure: CYSTOSCOPY WITH RETROGRADE PYELOGRAM AND LEFT STENT PLACEMENT;  Surgeon: Franchot Gallo, MD;  Location: [facility];  Service: Urology;  Laterality: Left;  . EXTRACORPOREAL SHOCK WAVE LITHOTRIPSY Left 08/08/2017   Procedure: LEFT EXTRACORPOREAL SHOCK WAVE LITHOTRIPSY (ESWL);  Surgeon: Nickie Retort, MD;  Location: [facility];  Service: Urology;  Laterality: Left;  . TEE WITHOUT CARDIOVERSION N/A 04/23/2016   Procedure: TRANSESOPHAGEAL ECHOCARDIOGRAM (TEE);  Surgeon: Jolaine Artist, MD;  Location: [facility];  Service: Cardiovascular;  Laterality: N/A;  . XI ROBOTIC ASSISTED SIMPLE PROSTATECTOMY N/A 12/25/2018   Procedure: XI ROBOTIC ASSISTED  SIMPLE PROSTATECTOMY, REMOVAL OF BLADDER STONES;  Surgeon: Cleon Gustin, MD;  Location: [facility];  Service: Urology;  Laterality: N/A;    Current Outpatient Medications  Medication Sig Dispense Refill  .  acetaminophen (TYLENOL) 325 MG tablet Take 2 tablets (650 mg total) by mouth every 6 (six) hours as needed for mild pain or headache.    . albuterol (PROVENTIL HFA;VENTOLIN HFA) 108 (90 Base) MCG/ACT inhaler Inhale 2 puffs into the lungs every 6 (six) hours as needed for wheezing or shortness of breath.    Marland Kitchen apixaban (ELIQUIS) 5 MG TABS tablet Take 1 tablet (5 mg total) by mouth 2 (two) times daily. 60 tablet 5  . Fluticasone-Salmeterol (ADVAIR) 250-50 MCG/DOSE AEPB Inhale 1 puff into the lungs 2 (two) times daily.     . furosemide (LASIX) 40 MG tablet Take 40 mg by mouth daily as needed for fluid or edema.     . pantoprazole (PROTONIX) 40 MG tablet Take 40 mg by mouth daily.    . potassium chloride (KLOR-CON) 10 MEQ tablet Take 20-30 mEq by mouth See admin instructions. Take 30 meq by mouth in the morning and 20 meq in the evening. (Patient taking differently: Take 20 mEq by mouth 2 (two) times daily. ) 360 tablet 1  . sacubitril-valsartan (ENTRESTO) 97-103 MG Take 1 tablet by mouth 2 (two) times daily. 180 tablet 3  . tadalafil (CIALIS) 5 MG tablet Take 5 mg by mouth daily.      No current facility-administered medications for this encounter.    Allergies  Allergen Reactions  . Ibuprofen Other (See Comments)    Has a-Fib, unable to take  . Isosorbide Other (See Comments)    HEADACHES from Imdur  . Lisinopril Cough  . Digoxin And Related Rash    Social History   Socioeconomic History  . Marital status: Married    Spouse name: Not on file  . Number of children: Not on file  . Years of education: Not on file  . Highest education level: Not on file  Occupational History  . Occupation: GREENHOUSE MANAGEMENT  Tobacco Use  . Smoking status: Never Smoker  . Smokeless tobacco: Never Used  Substance and Sexual Activity  . Alcohol use: No  . Drug use: No  . Sexual activity: Not on file  Other Topics Concern  . Not on file  Social History Narrative   Lives in [place] with his  spouse   He works as a Research scientist (life sciences) Strain:   . Difficulty of Paying Living Expenses:   Food Insecurity:   . Worried About Charity fundraiser in the Last Year:   . Arboriculturist in the Last Year:   Transportation Needs:   . Film/video editor (Medical):   Marland Kitchen Lack of Transportation (Non-Medical):   Physical Activity:   . Days of Exercise per Week:   . Minutes of Exercise per Session:   Stress:   . Feeling of Stress :   Social Connections:   . Frequency of Communication with Friends and Family:   . Frequency of Social Gatherings with Friends and Family:   . Attends Religious Services:   . Active Member of Clubs or Organizations:   . Attends Archivist Meetings:   Marland Kitchen Marital Status:   Intimate Partner Violence:   . Fear of Current or Ex-Partner:   . Emotionally Abused:   Marland Kitchen Physically Abused:   .  Sexually Abused:      ROS- All systems are reviewed and negative except as per the HPI above.  Physical Exam: Vitals:   05/11/19 1154  BP: 130/80  Pulse: (!) 49  Weight: 99.2 kg  Height: 6' (1.829 m)    GEN- The patient is well appearing, alert and oriented x 3 today.   HEENT-head normocephalic, atraumatic, sclera clear, conjunctiva pink, hearing intact, trachea midline. Lungs- Clear to ausculation bilaterally, normal work of breathing Heart- Regular rate and rhythm, bradycardia, no murmurs, rubs or gallops  GI- soft, NT, ND, + BS Extremities- no clubbing, cyanosis, or edema MS- no significant deformity or atrophy Skin- no rash or lesion Psych- euthymic mood, full affect Neuro- strength and sensation are intact   Wt Readings from Last 3 Encounters:  05/11/19 99.2 kg  04/16/19 99.2 kg  03/16/19 102.1 kg    EKG today demonstrates SB HR 49, RBBB, block PAC, PR 156, QRS 452, QTc 478  Echo 08/01/18 demonstrated  1. The left ventricle has a visually estimated ejection fraction of 50%.  The cavity size was  normal. Left ventricular diastolic Doppler parameters  are consistent with impaired relaxation. Left ventricular diffuse  hypokinesis.  2. The right ventricle has normal systolic function. The cavity was  normal. There is no increase in right ventricular wall thickness.  3. Left atrial size was mildly dilated.  4. Right atrial size was mildly dilated.  5. No evidence of mitral valve stenosis. Trivial mitral regurgitation.  6. The aortic valve is tricuspid. Aortic valve regurgitation is trivial  by color flow Doppler. No stenosis of the aortic valve.  7. The aortic root is normal in size and structure.  8. The inferior vena cava was dilated in size with >50% respiratory  variability. PA systolic pressure 32 mmHg.   Epic records are reviewed at length today  CHA2DS2-VASc Score = 2 The patient's score is based upon: CHF History: Yes HTN History: Yes Age : < 65 Diabetes History: No Stroke History: No Vascular Disease History: No Gender: Male   ASSESSMENT AND PLAN: 1. Persistent Atrial Fibrillation (ICD10:  I48.19) The patient's CHA2DS2-VASc score is 2, indicating a 2.2% annual risk of stroke.   S/p afib ablation with Dr Rayann Heman 03/16/19. Patient is in [place] today. Suspect irregular pulse due to PACs/blocked PACs. Patient reassured that some afib is normal soon after ablation.  Continue Eliquis 5 mg BID with no missed doses for at least 3 months post ablation.  2. Secondary Hypercoagulable State (ICD10:  D68.69) The patient is at significant risk for stroke/thromboembolism based upon his CHA2DS2-VASc Score of 2.  Continue Apixaban (Eliquis).   3. Obstructive sleep apnea The importance of adequate treatment of sleep apnea was discussed today in order to improve our ability to maintain sinus rhythm long term. Patient reports compliance with CPAP therapy.  4. HTN Stable, no changes today.  5. Tachycardia mediated CM EF normalized on last echo.   Follow up with Dr Rayann Heman as  scheduled.    [facility] [address] [place], [place] [address] [phone] 05/11/2019 12:16 PM

## 2019-06-18 ENCOUNTER — Telehealth (INDEPENDENT_AMBULATORY_CARE_PROVIDER_SITE_OTHER): Payer: 59 | Admitting: Internal Medicine

## 2019-06-18 ENCOUNTER — Telehealth: Payer: Self-pay

## 2019-06-18 ENCOUNTER — Other Ambulatory Visit: Payer: Self-pay

## 2019-06-18 DIAGNOSIS — G4733 Obstructive sleep apnea (adult) (pediatric): Secondary | ICD-10-CM | POA: Diagnosis not present

## 2019-06-18 DIAGNOSIS — I428 Other cardiomyopathies: Secondary | ICD-10-CM

## 2019-06-18 DIAGNOSIS — I4819 Other persistent atrial fibrillation: Secondary | ICD-10-CM

## 2019-06-18 NOTE — Telephone Encounter (Signed)
-----   Message from Thompson Grayer, MD sent at 06/18/2019  3:09 PM EDT ----- 3 months with me in office He will need an echo same day (prior to visit with me)

## 2019-06-18 NOTE — Progress Notes (Signed)
Electrophysiology TeleHealth Note  Due to national recommendations of social distancing due to [place] 19, an audio telehealth visit is felt to be most appropriate for this patient at this time.  Verbal consent was obtained by me for the telehealth visit today.  The patient does not have capability for a virtual visit.  A phone visit is therefore required today.   Date:  06/18/2019   ID:  [name], DOB 02/13/1955, MRN [id number]  Location: patient's home  Provider location:  [place]  Evaluation Performed: Follow-up visit  PCP:  [name], MD   Electrophysiologist:  Dr Rayann Heman  Chief Complaint:  palpitations  History of Present Illness:    [name] is a 64 y.o. male who presents via telehealth conferencing today.  Since his ablation, the patient reports doing very well.  He is pleased with results.  He noticed irregular pulse in April and had ekg at the afib clinic which revealed sinus rhythm.  He denies procedure related complications. Today, he denies symptoms of chest pain, shortness of breath,  lower extremity edema, dizziness, presyncope, or syncope.  The patient is otherwise without complaint today.     Past Medical History:  Diagnosis Date  . Asthma, persistent   . BPH (benign prostatic hyperplasia) 2014  . CHF (congestive heart failure) ([place])   . Combined hyperlipidemia   . Dysrhythmia    ATRIAL FIBRILLATION  . GERD (gastroesophageal reflux disease)   . History of kidney stones   . Hypertension   . 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 Obesity   . Persistent atrial fibrillation ([place])   . Renal stone 04/2012  . Seasonal allergic rhinitis   . Sleep apnea    does not use a cpap machine    Past Surgical History:  Procedure Laterality Date  . ATRIAL FIBRILLATION ABLATION N/A 03/16/2019   Procedure: ATRIAL FIBRILLATION ABLATION;  Surgeon: Thompson Grayer, MD;  Location: [facility];  Service: Cardiovascular;  Laterality: N/A;  .  CARDIOVERSION N/A 04/23/2016   Procedure: CARDIOVERSION;  Surgeon: Jolaine Artist, MD;  Location: [facility] ENDOSCOPY;  Service: Cardiovascular;  Laterality: N/A;  . CYSTOSCOPY WITH RETROGRADE PYELOGRAM, URETEROSCOPY AND STENT PLACEMENT Left 07/29/2017   Procedure: CYSTOSCOPY WITH RETROGRADE PYELOGRAM AND LEFT STENT PLACEMENT;  Surgeon: Franchot Gallo, MD;  [facility];  Service: Urology;  Laterality: Left;  . EXTRACORPOREAL SHOCK WAVE LITHOTRIPSY Left 08/08/2017   Procedure: LEFT EXTRACORPOREAL SHOCK WAVE LITHOTRIPSY (ESWL);  Surgeon: Nickie Retort, MD;  [facility];  Service: Urology;  Laterality: Left;  . TEE WITHOUT CARDIOVERSION N/A 04/23/2016   Procedure: TRANSESOPHAGEAL ECHOCARDIOGRAM (TEE);  Surgeon: Jolaine Artist, MD;  Location: [facility] ENDOSCOPY;  Service: Cardiovascular;  Laterality: N/A;  . XI ROBOTIC ASSISTED SIMPLE PROSTATECTOMY N/A 12/25/2018   Procedure: XI ROBOTIC ASSISTED SIMPLE PROSTATECTOMY, REMOVAL OF BLADDER STONES;  Surgeon: Cleon Gustin, MD;  Location: [facility];  Service: Urology;  Laterality: N/A;    Current Outpatient Medications  Medication Sig Dispense Refill  . acetaminophen (TYLENOL) 325 MG tablet Take 2 tablets (650 mg total) by mouth every 6 (six) hours as needed for mild pain or headache.    . albuterol (PROVENTIL HFA;VENTOLIN HFA) 108 (90 Base) MCG/ACT inhaler Inhale 2 puffs into the lungs every 6 (six) hours as needed for wheezing or shortness of breath.    Marland Kitchen apixaban (ELIQUIS) 5 MG TABS tablet Take 1 tablet (5 mg total) by mouth 2 (two) times daily. 60 tablet  5  . Fluticasone-Salmeterol (ADVAIR) 250-50 MCG/DOSE AEPB Inhale 1 puff into the lungs 2 (two) times daily.     . furosemide (LASIX) 40 MG tablet Take 40 mg by mouth daily as needed for fluid or edema.     . pantoprazole (PROTONIX) 40 MG tablet Take 40 mg by mouth daily.    . potassium chloride (KLOR-CON) 10 MEQ tablet Take 20-30 mEq by mouth See admin instructions. Take 30 meq by mouth in  the morning and 20 meq in the evening. (Patient taking differently: Take 20 mEq by mouth 2 (two) times daily. ) 360 tablet 1  . sacubitril-valsartan (ENTRESTO) 97-103 MG Take 1 tablet by mouth 2 (two) times daily. 180 tablet 3  . tadalafil (CIALIS) 5 MG tablet Take 5 mg by mouth daily.      No current facility-administered medications for this visit.    Allergies:   Ibuprofen, Isosorbide, Lisinopril, and Digoxin and related   Social History:  The patient  reports that he has never smoked. He has never used smokeless tobacco. He reports that he does not drink alcohol or use drugs.   ROS:  Please see the history of present illness.   All other systems are personally reviewed and negative.    Exam:    Vital Signs:  There were no vitals taken for this visit.  Well sounding, alert and conversant   Labs/Other Tests and Data Reviewed:    Recent Labs: 08/01/2018: ALT 19 10/16/2018: B Natriuretic Peptide 243.9 03/12/2019: Hemoglobin 10.4; Platelets 288 03/13/2019: BUN 18; Creatinine, Ser 1.72; Potassium 4.2; Sodium 139   Wt Readings from Last 3 Encounters:  05/11/19 218 lb 9.6 oz (99.2 kg)  04/16/19 218 lb 12.8 oz (99.2 kg)  03/16/19 225 lb (102.1 kg)     ASSESSMENT & PLAN:    1.  Persistent atrial fibrillation Doing very well post ablation off Amiodarone therapy chads2vasc score is 2.  He is on eliquis  2. Nonischemic CM Likely tachycardia mediated Repeat echo in 3 months  3. OSA He is not using his CPAP.  I have stressed the importance of compliance.  He is planning on getting nasal mask.  4. Obesity He has lost 15 lbs!  5. Hypertensive cardiovascular disease Stable No change required today  Risks, benefits and potential toxicities for medications prescribed and/or refilled reviewed with patient today.   Follow-up:  3 months with me   Patient Risk:  after full review of this patients clinical status, I feel that they are at moderate risk at this time.  Today, I have  spent 15 minutes with the patient with telehealth technology discussing arrhythmia management .    Army Fossa, MD  06/18/2019 3:05 PM     [facility] [address] [place] [address] [phone] (office) [phone] (fax)

## 2019-07-19 ENCOUNTER — Other Ambulatory Visit (HOSPITAL_COMMUNITY): Payer: Self-pay | Admitting: Internal Medicine

## 2019-07-24 ENCOUNTER — Other Ambulatory Visit (HOSPITAL_COMMUNITY): Payer: Self-pay | Admitting: Internal Medicine

## 2019-07-26 ENCOUNTER — Other Ambulatory Visit (HOSPITAL_COMMUNITY): Payer: Self-pay | Admitting: Internal Medicine

## 2019-08-29 ENCOUNTER — Ambulatory Visit (HOSPITAL_COMMUNITY)
Admission: RE | Admit: 2019-08-29 | Discharge: 2019-08-29 | Disposition: A | Discharge status: Home / Self Care | Payer: 59 | Source: Ambulatory Visit | Attending: Physician Assistant | Admitting: Physician Assistant

## 2019-08-29 ENCOUNTER — Telehealth (HOSPITAL_COMMUNITY): Payer: Self-pay | Admitting: *Deleted

## 2019-08-29 ENCOUNTER — Other Ambulatory Visit: Payer: Self-pay

## 2019-08-29 VITALS — BP 140/78 | HR 57 | Ht 72.0 in | Wt 224.2 lb

## 2019-08-29 DIAGNOSIS — R42 Dizziness and giddiness: Secondary | ICD-10-CM | POA: Insufficient documentation

## 2019-08-29 DIAGNOSIS — N189 Chronic kidney disease, unspecified: Secondary | ICD-10-CM | POA: Insufficient documentation

## 2019-08-29 DIAGNOSIS — Z7901 Long term (current) use of anticoagulants: Secondary | ICD-10-CM | POA: Diagnosis not present

## 2019-08-29 DIAGNOSIS — R000 Tachycardia, unspecified: Secondary | ICD-10-CM | POA: Insufficient documentation

## 2019-08-29 DIAGNOSIS — Z79899 Other long term (current) drug therapy: Secondary | ICD-10-CM | POA: Insufficient documentation

## 2019-08-29 DIAGNOSIS — G4733 Obstructive sleep apnea (adult) (pediatric): Secondary | ICD-10-CM | POA: Diagnosis not present

## 2019-08-29 DIAGNOSIS — R002 Palpitations: Secondary | ICD-10-CM | POA: Diagnosis not present

## 2019-08-29 DIAGNOSIS — E782 Mixed hyperlipidemia: Secondary | ICD-10-CM | POA: Insufficient documentation

## 2019-08-29 DIAGNOSIS — D6869 Other thrombophilia: Secondary | ICD-10-CM | POA: Insufficient documentation

## 2019-08-29 DIAGNOSIS — I4819 Other persistent atrial fibrillation: Secondary | ICD-10-CM | POA: Insufficient documentation

## 2019-08-29 DIAGNOSIS — I509 Heart failure, unspecified: Secondary | ICD-10-CM | POA: Insufficient documentation

## 2019-08-29 DIAGNOSIS — K219 Gastro-esophageal reflux disease without esophagitis: Secondary | ICD-10-CM | POA: Insufficient documentation

## 2019-08-29 DIAGNOSIS — Z8249 Family history of ischemic heart disease and other diseases of the circulatory system: Secondary | ICD-10-CM | POA: Insufficient documentation

## 2019-08-29 DIAGNOSIS — I130 Hypertensive heart and chronic kidney disease with heart failure and stage 1 through stage 4 chronic kidney disease, or unspecified chronic kidney disease: Secondary | ICD-10-CM | POA: Insufficient documentation

## 2019-08-29 MED ORDER — FUROSEMIDE 40 MG PO TABS: ORAL_TABLET | ORAL | Status: DC

## 2019-08-29 MED ORDER — POTASSIUM CHLORIDE ER 10 MEQ PO TBCR: EXTENDED_RELEASE_TABLET | ORAL | Status: DC

## 2019-08-29 NOTE — Telephone Encounter (Signed)
Pt called to report he feels like he has been in a-fib for past couple of days. He states his home BP machine will not register but when he palpates pulse it feels very irregular. He states he is feeling lightheaded at times. Appt sch for today at 1:30 in a-fib clinic

## 2019-08-29 NOTE — Progress Notes (Signed)
Primary Care Physician: [name], MD Primary Cardiologist: Dr Johnsie Cancel Primary Electrophysiologist: Dr Rayann Heman [facility]: Dr Haroldine Laws Referring Physician: Dr Rayann Heman   [name] is a 64 y.o. male with a history of persistent atrial fibrillation, tachycardia mediated CM, CKD, HTN, OSA who presents for follow up in the [facility].  The patient was initially diagnosed with atrial fibrillation 2018 after presenting with symptoms of SOB and fatigue. 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  He initially failed cardioversion and was placed on amiodarone.  With rhythm control, his EF has improved from 20% to 50%.  He did well for 2 years with low dose amiodarone.  (amiodarone had been reduced due to sinus bradycardia). Unfortunately, he continued to have episodes of afib. He underwent afib ablation on 03/16/19 with Dr Rayann Heman. Patient is on Eliquis for a CHADS2VASC score of 2. Patient reports that he noted an irregular pulse 05/10/19 and was worried he was back in afib. ECG showed SR with PACs.  On follow up today, patient reports that he has had intermittent dizziness while working out in the yard for about one week. He also notes an irregular pulse on his BP machine. He denies presyncope or falls.   Today, he denies symptoms of palpitations, chest pain, shortness of breath, orthopnea, PND, lower extremity edema, presyncope, syncope, snoring, daytime somnolence, bleeding, or neurologic sequela. The patient is tolerating medications without difficulties and is otherwise without complaint today.    Atrial Fibrillation Risk Factors:  he does have symptoms or diagnosis of sleep apnea. he is compliant with CPAP therapy.   he has a BMI of Body mass index is 30.41 kg/m.Marland Kitchen Filed Weights   08/29/19 1338  Weight: 101.7 kg    Family History  Problem Relation Age of Onset  . Hypertension Mother      Atrial Fibrillation Management history:  Previous antiarrhythmic  drugs: amiodarone Previous cardioversions: 03/2016 Previous ablations: 03/16/19 CHADS2VASC score: 2 Anticoagulation history: Eliquis   Past Medical History:  Diagnosis Date  . Asthma, persistent   . BPH (benign prostatic hyperplasia) 2014  . CHF (congestive heart failure) ([place])   . Combined hyperlipidemia   . Dysrhythmia    ATRIAL FIBRILLATION  . GERD (gastroesophageal reflux disease)   . History of kidney stones   . Hypertension   . 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 Obesity   . Persistent atrial fibrillation ([place])   . Renal stone 04/2012  . Seasonal allergic rhinitis   . Sleep apnea    does not use a cpap machine   Past Surgical History:  Procedure Laterality Date  . ATRIAL FIBRILLATION ABLATION N/A 03/16/2019   Procedure: ATRIAL FIBRILLATION ABLATION;  Surgeon: Thompson Grayer, MD;  Location: [place] [facility];  Service: Cardiovascular;  Laterality: N/A;  . CARDIOVERSION N/A 04/23/2016   Procedure: CARDIOVERSION;  Surgeon: Jolaine Artist, MD;  Location[facility] ENDOSCOPY;  Service: Cardiovascular;  Laterality: N/A;  . CYSTOSCOPY WITH RETROGRADE PYELOGRAM, URETEROSCOPY AND STENT PLACEMENT Left 07/29/2017   Procedure: CYSTOSCOPY WITH RETROGRADE PYELOGRAM AND LEFT STENT PLACEMENT;  Surgeon: Franchot Gallo, MD;  [facility];  Service: Urology;  Laterality: Left;  . EXTRACORPOREAL SHOCK WAVE LITHOTRIPSY Left 08/08/2017   Procedure: LEFT EXTRACORPOREAL SHOCK WAVE LITHOTRIPSY (ESWL);  Surgeon: Nickie Retort, MD;  [facility]: [facility];  Service: Urology;  Laterality: Left;  . TEE WITHOUT CARDIOVERSION N/A 04/23/2016   Procedure: TRANSESOPHAGEAL ECHOCARDIOGRAM (TEE);  Surgeon: Jolaine Artist, MD;  Location: [facility]  ENDOSCOPY;  Service: Cardiovascular;  Laterality: N/A;  . XI ROBOTIC ASSISTED SIMPLE PROSTATECTOMY N/A 12/25/2018   Procedure: XI ROBOTIC ASSISTED SIMPLE PROSTATECTOMY, REMOVAL OF BLADDER STONES;  Surgeon: Cleon Gustin, MD;  Location: [facility];  Service:  Urology;  Laterality: N/A;    Current Outpatient Medications  Medication Sig Dispense Refill  . acetaminophen (TYLENOL) 325 MG tablet Take 2 tablets (650 mg total) by mouth every 6 (six) hours as needed for mild pain or headache.    . albuterol (PROVENTIL HFA;VENTOLIN HFA) 108 (90 Base) MCG/ACT inhaler Inhale 2 puffs into the lungs every 6 (six) hours as needed for wheezing or shortness of breath.    [name] 5 MG TABS tablet TAKE 1 TABLET BY MOUTH TWICE A DAY 180 tablet 1  . Fluticasone-Salmeterol (ADVAIR) 250-50 MCG/DOSE AEPB Inhale 1 puff into the lungs 2 (two) times daily.     . furosemide (LASIX) 40 MG tablet Take one tablet by mouth on Mondays and Fridays    . pantoprazole (PROTONIX) 40 MG tablet Take 40 mg by mouth daily.    . potassium chloride (KLOR-CON) 10 MEQ tablet Take one tablet by mouth daily    . sacubitril-valsartan (ENTRESTO) 97-103 MG Take 1 tablet by mouth 2 (two) times daily. 180 tablet 3  . tadalafil (CIALIS) 5 MG tablet Take 5 mg by mouth daily.      No current facility-administered medications for this encounter.    Allergies  Allergen Reactions  . Ibuprofen Other (See Comments)    Has a-Fib, unable to take  . Isosorbide Other (See Comments)    HEADACHES from Imdur  . Lisinopril Cough  . Digoxin And Related Rash    Social History   Socioeconomic History  . Marital status: Married    Spouse name: Not on file  . Number of children: Not on file  . Years of education: Not on file  . Highest education level: Not on file  Occupational History  . Occupation: GREENHOUSE MANAGEMENT  Tobacco Use  . Smoking status: Never Smoker  . Smokeless tobacco: Never Used  Vaping Use  . Vaping Use: Never used  Substance and Sexual Activity  . Alcohol use: No  . Drug use: No  . Sexual activity: Not on file  Other Topics Concern  . Not on file  Social History Narrative   Lives in [place] with his spouse   He works as a Air traffic controller Strain:   . Difficulty of Paying Living Expenses:   Food Insecurity:   . Worried About Charity fundraiser in the Last Year:   . Arboriculturist in the Last Year:   Transportation Needs:   . Film/video editor (Medical):   Marland Kitchen Lack of Transportation (Non-Medical):   Physical Activity:   . Days of Exercise per Week:   . Minutes of Exercise per Session:   Stress:   . Feeling of Stress :   Social Connections:   . Frequency of Communication with Friends and Family:   . Frequency of Social Gatherings with Friends and Family:   . Attends Religious Services:   . Active Member of Clubs or Organizations:   . Attends Archivist Meetings:   Marland Kitchen Marital Status:   Intimate Partner Violence:   . Fear of Current or Ex-Partner:   . Emotionally Abused:   Marland Kitchen Physically Abused:   . Sexually Abused:      ROS-  All systems are reviewed and negative except as per the HPI above.  Physical Exam: Vitals:   08/29/19 1338  BP: 140/78  Pulse: (!) 57  Weight: 101.7 kg  Height: 6' (1.829 m)    GEN- The patient is well appearing obese male, alert and oriented x 3 today.   HEENT-head normocephalic, atraumatic, sclera clear, conjunctiva pink, hearing intact, trachea midline. Lungs- Clear to ausculation bilaterally, normal work of breathing Heart- slightly irregular rate and rhythm, no murmurs, rubs or gallops  GI- soft, NT, ND, + BS Extremities- no clubbing, cyanosis, or edema MS- no significant deformity or atrophy Skin- no rash or lesion Psych- euthymic mood, full affect Neuro- strength and sensation are intact   Wt Readings from Last 3 Encounters:  08/29/19 101.7 kg  05/11/19 99.2 kg  04/16/19 99.2 kg    EKG today demonstrates SB HR 57 with blocked PACs, junctional beats, RBBB, QRS 150, QTc 467  Echo 08/01/18 demonstrated  1. The left ventricle has a visually estimated ejection fraction of 50%.  The cavity size was normal. Left ventricular diastolic Doppler  parameters  are consistent with impaired relaxation. Left ventricular diffuse  hypokinesis.  2. The right ventricle has normal systolic function. The cavity was  normal. There is no increase in right ventricular wall thickness.  3. Left atrial size was mildly dilated.  4. Right atrial size was mildly dilated.  5. No evidence of mitral valve stenosis. Trivial mitral regurgitation.  6. The aortic valve is tricuspid. Aortic valve regurgitation is trivial  by color flow Doppler. No stenosis of the aortic valve.  7. The aortic root is normal in size and structure.  8. The inferior vena cava was dilated in size with >50% respiratory  variability. PA systolic pressure 32 mmHg.   Epic records are reviewed at length today  CHA2DS2-VASc Score = 2 The patient's score is based upon: CHF History: Yes HTN History: Yes Age : < 65 Diabetes History: No Stroke History: No Vascular Disease History: No Gender: Male   ASSESSMENT AND PLAN: 1. Persistent Atrial Fibrillation (ICD10:  I48.19) The patient's CHA2DS2-VASc score is 2, indicating a 2.2% annual risk of stroke.   S/p afib ablation with Dr Rayann Heman 03/16/19. Patient in [place] today. Continue Eliquis 5 mg BID   2. Secondary Hypercoagulable State (ICD10:  D68.69) The patient is at significant risk for stroke/thromboembolism based upon his CHA2DS2-VASc Score of 2.  Continue Apixaban (Eliquis).   3. Obstructive sleep apnea Patient reports compliance with CPAP therapy.  4. HTN Stable, no changes today.  5. Tachycardia mediated CM EF normalized on last echo.  6. Dizziness Unclear etiology, dehydration vs orthostasis vs arrhythmia. Will order a Zio patch to evaluate rhythm given his conduction disease noted on ECG.    Follow up with Dr Rayann Heman as scheduled.    [facility] [address] [place], [place] [address] [phone] 08/29/2019 3:46 PM

## 2019-09-18 NOTE — Addendum Note (Signed)
Encounter addended by: Juluis Mire, RN on: 09/18/2019 2:21 PM  Actions taken: Imaging Exam ended

## 2019-09-21 ENCOUNTER — Ambulatory Visit (HOSPITAL_COMMUNITY): Payer: 59 | Attending: Internal Medicine

## 2019-09-21 ENCOUNTER — Ambulatory Visit (INDEPENDENT_AMBULATORY_CARE_PROVIDER_SITE_OTHER): Payer: 59 | Admitting: Internal Medicine

## 2019-09-21 ENCOUNTER — Other Ambulatory Visit: Payer: Self-pay

## 2019-09-21 ENCOUNTER — Encounter: Payer: Self-pay | Admitting: Internal Medicine

## 2019-09-21 VITALS — BP 134/82 | HR 54 | Ht 72.0 in | Wt 221.0 lb

## 2019-09-21 DIAGNOSIS — G4733 Obstructive sleep apnea (adult) (pediatric): Secondary | ICD-10-CM

## 2019-09-21 DIAGNOSIS — I428 Other cardiomyopathies: Secondary | ICD-10-CM

## 2019-09-21 DIAGNOSIS — I5022 Chronic systolic (congestive) heart failure: Secondary | ICD-10-CM

## 2019-09-21 DIAGNOSIS — I10 Essential (primary) hypertension: Secondary | ICD-10-CM | POA: Diagnosis not present

## 2019-09-21 DIAGNOSIS — I4819 Other persistent atrial fibrillation: Secondary | ICD-10-CM | POA: Insufficient documentation

## 2019-09-21 DIAGNOSIS — D6869 Other thrombophilia: Secondary | ICD-10-CM | POA: Diagnosis not present

## 2019-09-21 LAB — ECHOCARDIOGRAM COMPLETE
Area-P 1/2: 2.87 cm2
S' Lateral: 3.5 cm

## 2019-09-21 NOTE — Progress Notes (Signed)
PCP: [name], MD   Primary EP: Dr Rayann Heman  [name] is a 64 y.o. male who presents today for routine electrophysiology followup.  Since last being seen in our clinic, the patient reports doing very well.  He had postural dizziness recently for which he was evaluated in the AF clinic.  He has occasional palpitations but no sustained afib.  Today, he denies symptoms of chest pain, shortness of breath,  lower extremity edema, dizziness, presyncope, or syncope.  The patient is otherwise without complaint today.   Past Medical History:  Diagnosis Date  . Asthma, persistent   . BPH (benign prostatic hyperplasia) 2014  . CHF (congestive heart failure) ([place])   . Combined hyperlipidemia   . Dysrhythmia    ATRIAL FIBRILLATION  . GERD (gastroesophageal reflux disease)   . History of kidney stones   . Hypertension   . 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 Obesity   . Persistent atrial fibrillation ([place])   . Renal stone 04/2012  . Seasonal allergic rhinitis   . Sleep apnea    does not use a cpap machine   Past Surgical History:  Procedure Laterality Date  . ATRIAL FIBRILLATION ABLATION N/A 03/16/2019   Procedure: ATRIAL FIBRILLATION ABLATION;  Surgeon: Thompson Grayer, MD;  Location: [place] CV LAB;  Service: Cardiovascular;  Laterality: N/A;  . CARDIOVERSION N/A 04/23/2016   Procedure: CARDIOVERSION;  Surgeon: Jolaine Artist, MD;  Location: [facility] ENDOSCOPY;  Service: Cardiovascular;  Laterality: N/A;  . CYSTOSCOPY WITH RETROGRADE PYELOGRAM, URETEROSCOPY AND STENT PLACEMENT Left 07/29/2017   Procedure: CYSTOSCOPY WITH RETROGRADE PYELOGRAM AND LEFT STENT PLACEMENT;  Surgeon: Franchot Gallo, MD;  [facility];  Service: Urology;  Laterality: Left;  . EXTRACORPOREAL SHOCK WAVE LITHOTRIPSY Left 08/08/2017   Procedure: LEFT EXTRACORPOREAL SHOCK WAVE LITHOTRIPSY (ESWL);  Surgeon: Nickie Retort, MD;  [facility]: [facility];  Service: Urology;  Laterality: Left;  . TEE  WITHOUT CARDIOVERSION N/A 04/23/2016   Procedure: TRANSESOPHAGEAL ECHOCARDIOGRAM (TEE);  Surgeon: Jolaine Artist, MD;  Location: [facility] ENDOSCOPY;  Service: Cardiovascular;  Laterality: N/A;  . XI ROBOTIC ASSISTED SIMPLE PROSTATECTOMY N/A 12/25/2018   Procedure: XI ROBOTIC ASSISTED SIMPLE PROSTATECTOMY, REMOVAL OF BLADDER STONES;  Surgeon: Cleon Gustin, MD;  Location: [facility];  Service: Urology;  Laterality: N/A;    ROS- all systems are reviewed and negatives except as per HPI above  Current Outpatient Medications  Medication Sig Dispense Refill  . acetaminophen (TYLENOL) 325 MG tablet Take 2 tablets (650 mg total) by mouth every 6 (six) hours as needed for mild pain or headache.    . albuterol (PROVENTIL HFA;VENTOLIN HFA) 108 (90 Base) MCG/ACT inhaler Inhale 2 puffs into the lungs every 6 (six) hours as needed for wheezing or shortness of breath.    [name] 5 MG TABS tablet TAKE 1 TABLET BY MOUTH TWICE A DAY 180 tablet 1  . Fluticasone-Salmeterol (ADVAIR) 250-50 MCG/DOSE AEPB Inhale 1 puff into the lungs 2 (two) times daily.     . furosemide (LASIX) 40 MG tablet Take one tablet by mouth on Mondays and Fridays    . pantoprazole (PROTONIX) 40 MG tablet Take 40 mg by mouth daily.    . potassium chloride (KLOR-CON) 10 MEQ tablet Take one tablet by mouth daily    . sacubitril-valsartan (ENTRESTO) 97-103 MG Take 1 tablet by mouth 2 (two) times daily. 180 tablet 3  . tadalafil (CIALIS) 5 MG tablet Take 5 mg by mouth daily.  No current facility-administered medications for this visit.    Physical Exam: Vitals:   09/21/19 1623  BP: 134/82  Pulse: (!) 54  SpO2: 93%  Weight: 221 lb (100.2 kg)  Height: 6' (1.829 m)    GEN- The patient is well appearing, alert and oriented x 3 today.   Head- normocephalic, atraumatic Eyes-  Sclera clear, conjunctiva pink Ears- hearing intact Oropharynx- clear Lungs- Clear to ausculation bilaterally, normal work of breathing Heart- Regular rate  and rhythm, no murmurs, rubs or gallops, PMI not laterally displaced GI- soft, NT, ND, + BS Extremities- no clubbing, cyanosis, or edema  Wt Readings from Last 3 Encounters:  09/21/19 221 lb (100.2 kg)  08/29/19 224 lb 3.2 oz (101.7 kg)  05/11/19 218 lb 9.6 oz (99.2 kg)    EKG tracing ordered today is personally reviewed and shows sinus rhythm RBBB  Assessment and Plan:  1. Persistent afib Doing well post ablation chads2vasc score is 2.  Continue eliquis I have personally reviewed his recent zio which did not reveal afib Palpitations appear to be primarily nonsustained atach.  He also has PVCs and rare nonsustained afib. Bmet, mg and cbc today  2. Sinus bradycardia Stable No change required today  3. Tachycardia mediated CM Stable Repeat echo is pending On lasix Bmet, mg today  4. HTN Stable No change required today bmet today  5. OSA Compliance with therapy is advised  6. Dizziness Postural Improved Adequate hydration and heat avoidance are encouraged Bmet, mg and cbc today  Risks, benefits and potential toxicities for medications prescribed and/or refilled reviewed with patient today.   Return in 3 months  Thompson Grayer MD, [facility] 09/21/2019 4:44 PM

## 2019-09-21 NOTE — Patient Instructions (Signed)
Medication Instructions:  Your physician recommends that you continue on your current medications as directed. Please refer to the Current Medication list given to you today.  Labwork: You will get lab work today:  CBC, BMP and magnesium  Testing/Procedures: None ordered.  Follow-Up: Your physician wants you to follow-up in: 3 months with Dr. Rayann Heman.        Any Other Special Instructions Will Be Listed Below (If Applicable).  If you need a refill on your cardiac medications before your next appointment, please call your pharmacy.

## 2019-09-22 LAB — CBC WITH DIFFERENTIAL/PLATELET
Basophils Absolute: 0.1 10*3/uL (ref 0.0–0.2)
Basos: 1 %
EOS (ABSOLUTE): 0.2 10*3/uL (ref 0.0–0.4)
Eos: 3 %
Hematocrit: 36.4 % — ABNORMAL LOW (ref 37.5–51.0)
Hemoglobin: 11.6 g/dL — ABNORMAL LOW (ref 13.0–17.7)
Immature Grans (Abs): 0 10*3/uL (ref 0.0–0.1)
Immature Granulocytes: 0 %
Lymphocytes Absolute: 1.5 10*3/uL (ref 0.7–3.1)
Lymphs: 27 %
MCH: 24.6 pg — ABNORMAL LOW (ref 26.6–33.0)
MCHC: 31.9 g/dL (ref 31.5–35.7)
MCV: 77 fL — ABNORMAL LOW (ref 79–97)
Monocytes Absolute: 0.8 10*3/uL (ref 0.1–0.9)
Monocytes: 14 %
Neutrophils Absolute: 3.1 10*3/uL (ref 1.4–7.0)
Neutrophils: 55 %
Platelets: 301 10*3/uL (ref 150–450)
RBC: 4.72 x10E6/uL (ref 4.14–5.80)
RDW: 15.5 % — ABNORMAL HIGH (ref 11.6–15.4)
WBC: 5.6 10*3/uL (ref 3.4–10.8)

## 2019-09-22 LAB — MAGNESIUM: Magnesium: 2.2 mg/dL (ref 1.6–2.3)

## 2019-09-22 LAB — BASIC METABOLIC PANEL
BUN/Creatinine Ratio: 12 (ref 10–24)
BUN: 19 mg/dL (ref 8–27)
CO2: 24 mmol/L (ref 20–29)
Calcium: 9 mg/dL (ref 8.6–10.2)
Chloride: 105 mmol/L (ref 96–106)
Creatinine, Ser: 1.6 mg/dL — ABNORMAL HIGH (ref 0.76–1.27)
GFR calc Af Amer: 52 mL/min/{1.73_m2} — ABNORMAL LOW (ref >59)
GFR calc non Af Amer: 45 mL/min/{1.73_m2} — ABNORMAL LOW (ref >59)
Glucose: 82 mg/dL (ref 65–99)
Potassium: 3.5 mmol/L (ref 3.5–5.2)
Sodium: 143 mmol/L (ref 134–144)

## 2019-09-28 ENCOUNTER — Telehealth: Payer: Self-pay

## 2019-09-28 NOTE — Telephone Encounter (Signed)
-----   Message from Thompson Grayer, MD sent at 09/27/2019 10:27 PM EDT ----- Results reviewed.  [name], please inform pt of result. I will route to primary care also.

## 2019-09-28 NOTE — Telephone Encounter (Signed)
The patient has been notified of the result and verbalized understanding.  All questions (if any) were answered. Wilma Flavin, RN 09/28/2019 9:18 AM

## 2019-09-28 NOTE — Telephone Encounter (Signed)
-----   Message from Thompson Grayer, MD sent at 09/27/2019 10:26 PM EDT ----- Results reviewed.  Otila Kluver, please inform pt of result. I will route to primary care also.

## 2019-10-19 ENCOUNTER — Other Ambulatory Visit (HOSPITAL_COMMUNITY): Payer: Self-pay | Admitting: Internal Medicine

## 2019-12-12 ENCOUNTER — Ambulatory Visit: Payer: 59 | Admitting: Urology

## 2019-12-17 ENCOUNTER — Ambulatory Visit (INDEPENDENT_AMBULATORY_CARE_PROVIDER_SITE_OTHER): Payer: 59 | Admitting: Internal Medicine

## 2019-12-17 ENCOUNTER — Other Ambulatory Visit: Payer: Self-pay

## 2019-12-17 ENCOUNTER — Encounter: Payer: Self-pay | Admitting: Internal Medicine

## 2019-12-17 VITALS — BP 140/80 | HR 84 | Ht 72.0 in | Wt 228.6 lb

## 2019-12-17 DIAGNOSIS — G4733 Obstructive sleep apnea (adult) (pediatric): Secondary | ICD-10-CM | POA: Diagnosis not present

## 2019-12-17 DIAGNOSIS — I10 Essential (primary) hypertension: Secondary | ICD-10-CM

## 2019-12-17 DIAGNOSIS — I4819 Other persistent atrial fibrillation: Secondary | ICD-10-CM | POA: Diagnosis not present

## 2019-12-17 DIAGNOSIS — I428 Other cardiomyopathies: Secondary | ICD-10-CM

## 2019-12-17 DIAGNOSIS — D6869 Other thrombophilia: Secondary | ICD-10-CM

## 2019-12-17 NOTE — Progress Notes (Signed)
PCP: [name], MD Primary Cardiologist: Dr Johnsie Cancel CHF: [place] Primary EP: Dr Rayann Heman  [name] is a 64 y.o. male who presents today for routine electrophysiology followup.  Since last being seen in our clinic, the patient reports doing very well.  Today, he denies symptoms of palpitations, chest pain, shortness of breath,  lower extremity edema, dizziness, presyncope, or syncope.  The patient is otherwise without complaint today.   Past Medical History:  Diagnosis Date  . Asthma, persistent   . BPH (benign prostatic hyperplasia) 2014  . CHF (congestive heart failure) ([place])   . Combined hyperlipidemia   . Dysrhythmia    ATRIAL FIBRILLATION  . GERD (gastroesophageal reflux disease)   . History of kidney stones   . Hypertension   . 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 Obesity   . Persistent atrial fibrillation ([place])   . Renal stone 04/2012  . Seasonal allergic rhinitis   . Sleep apnea    does not use a cpap machine   Past Surgical History:  Procedure Laterality Date  . ATRIAL FIBRILLATION ABLATION N/A 03/16/2019   Procedure: ATRIAL FIBRILLATION ABLATION;  Surgeon: Thompson Grayer, MD;  Location: [place] CV LAB;  Service: Cardiovascular;  Laterality: N/A;  . CARDIOVERSION N/A 04/23/2016   Procedure: CARDIOVERSION;  Surgeon: Jolaine Artist, MD;  Location: [facility] ENDOSCOPY;  Service: Cardiovascular;  Laterality: N/A;  . CYSTOSCOPY WITH RETROGRADE PYELOGRAM, URETEROSCOPY AND STENT PLACEMENT Left 07/29/2017   Procedure: CYSTOSCOPY WITH RETROGRADE PYELOGRAM AND LEFT STENT PLACEMENT;  Surgeon: Franchot Gallo, MD;  [facility];  Service: Urology;  Laterality: Left;  . EXTRACORPOREAL SHOCK WAVE LITHOTRIPSY Left 08/08/2017   Procedure: LEFT EXTRACORPOREAL SHOCK WAVE LITHOTRIPSY (ESWL);  Surgeon: Nickie Retort, MD;  [facility];  Service: Urology;  Laterality: Left;  . TEE WITHOUT CARDIOVERSION N/A 04/23/2016   Procedure: TRANSESOPHAGEAL ECHOCARDIOGRAM  (TEE);  Surgeon: Jolaine Artist, MD;  [facility] ENDOSCOPY;  Service: Cardiovascular;  Laterality: N/A;  . XI ROBOTIC ASSISTED SIMPLE PROSTATECTOMY N/A 12/25/2018   Procedure: XI ROBOTIC ASSISTED SIMPLE PROSTATECTOMY, REMOVAL OF BLADDER STONES;  Surgeon: Cleon Gustin, MD;  [facility];  Service: Urology;  Laterality: N/A;    ROS- all systems are reviewed and negatives except as per HPI above  Current Outpatient Medications  Medication Sig Dispense Refill  . acetaminophen (TYLENOL) 325 MG tablet Take 2 tablets (650 mg total) by mouth every 6 (six) hours as needed for mild pain or headache.    . albuterol (PROVENTIL HFA;VENTOLIN HFA) 108 (90 Base) MCG/ACT inhaler Inhale 2 puffs into the lungs every 6 (six) hours as needed for wheezing or shortness of breath.    [name] 5 MG TABS tablet TAKE 1 TABLET BY MOUTH TWICE A DAY 180 tablet 1  . Fluticasone-Salmeterol (ADVAIR) 250-50 MCG/DOSE AEPB Inhale 1 puff into the lungs 2 (two) times daily.     . furosemide (LASIX) 40 MG tablet Take one tablet by mouth on Mondays and Fridays    . pantoprazole (PROTONIX) 40 MG tablet Take 40 mg by mouth daily.    . potassium chloride (KLOR-CON) 10 MEQ tablet Take one tablet by mouth daily    . sacubitril-valsartan (ENTRESTO) 97-103 MG Take 1 tablet by mouth 2 (two) times daily. Please call office for visit [phone] 60 tablet 1  . tadalafil (CIALIS) 5 MG tablet Take 5 mg by mouth daily.      No current facility-administered medications for this visit.    Physical  Exam: Vitals:   12/17/19 1618  BP: 140/80  Pulse: 84  SpO2: 94%  Weight: 228 lb 9.6 oz (103.7 kg)  Height: 6' (1.829 m)    GEN- The patient is well appearing, alert and oriented x 3 today.   Head- normocephalic, atraumatic Eyes-  Sclera clear, conjunctiva pink Ears- hearing intact Oropharynx- clear Lungs- Clear to ausculation bilaterally, normal work of breathing Heart- Regular rate and rhythm, no murmurs, rubs or  gallops, PMI not laterally displaced GI- soft, NT, ND, + BS Extremities- no clubbing, cyanosis, or edema  Wt Readings from Last 3 Encounters:  12/17/19 228 lb 9.6 oz (103.7 kg)  09/21/19 221 lb (100.2 kg)  08/29/19 224 lb 3.2 oz (101.7 kg)    EKG tracing ordered today is personally reviewed and shows sinus with PACs, PVCs, RBBB  Assessment and Plan:  1. Persistent afib Doing well post ablation off AAD therapy chads2vasc score is 2.  He is on eliquis  2. Sinus bradycardia Improved  3. Tachycardia mediated CM EF has normalized with sinus!  4. HTN Stable No change required today  5. OSA Compliance with therapy is advised  Risks, benefits and potential toxicities for medications prescribed and/or refilled reviewed with patient today.   Overdue to see heart failure team.   Follow-up with EP PA in 6 months I will see in a year  Thompson Grayer MD, [facility] 12/17/2019 4:19 PM

## 2019-12-17 NOTE — Patient Instructions (Addendum)
Medication Instructions:  Your physician recommends that you continue on your current medications as directed. Please refer to the Current Medication list given to you today.  *If you need a refill on your cardiac medications before your next appointment, please call your pharmacy*  Lab Work: None ordered.  If you have labs (blood work) drawn today and your tests are completely normal, you will receive your results only by: Marland Kitchen MyChart Message (if you have MyChart) OR . A paper copy in the mail If you have any lab test that is abnormal or we need to change your treatment, we will call you to review the results.  Testing/Procedures: None ordered.  Follow-Up: At [facility], you and your health needs are our priority.  As part of our continuing mission to provide you with exceptional heart care, we have created designated Provider Care Teams.  These Care Teams include your primary Cardiologist (physician) and Advanced Practice Providers (APPs -  Physician Assistants and Nurse Practitioners) who all work together to provide you with the care you need, when you need it.  We recommend signing up for the patient portal called "MyChart".  Sign up information is provided on this After Visit Summary.  MyChart is used to connect with patients for Virtual Visits (Telemedicine).  Patients are able to view lab/test results, encounter notes, upcoming appointments, etc.  Non-urgent messages can be sent to your provider as well.   To learn more about what you can do with MyChart, go to [email or web address].    Your next appointment:   Your physician wants you to follow-up in:  6 months with Oda Kilts & 1 year with Dr. Rayann Heman. You will receive a reminder letter in the mail two months in advance. If you don't receive a letter, please call our office to schedule the follow-up appointment.    Other Instructions:

## 2019-12-18 ENCOUNTER — Telehealth (HOSPITAL_COMMUNITY): Payer: Self-pay | Admitting: Pharmacy Technician

## 2019-12-18 ENCOUNTER — Other Ambulatory Visit (HOSPITAL_COMMUNITY): Payer: Self-pay | Admitting: *Deleted

## 2019-12-18 MED ORDER — ENTRESTO 97-103 MG PO TABS: 1.0000 | ORAL_TABLET | Freq: Two times a day (BID) | ORAL | 1 refills | Status: DC

## 2019-12-18 NOTE — Telephone Encounter (Signed)
Patient was having a hard time getting his Entresto filled. When I ran a test claim, the co-pay came back at $0.   Called and spoke with the patient. He informed me that he just needs a refill.  Sent Heather Investment banker, corporate) a message requesting a refill of Entresto be sent to his pharmacy now that he has a follow up appointment established.   Charlann Boxer, CPhT

## 2020-01-30 ENCOUNTER — Other Ambulatory Visit (HOSPITAL_COMMUNITY): Payer: Self-pay | Admitting: Internal Medicine

## 2020-01-31 NOTE — Progress Notes (Signed)
Advanced Heart Failure Clinic Note    Primary Cardiologist: Dr. Johnsie Cancel HF: Dr Haroldine Laws  PCP: Dr Laurann Montana Urology:  Dr Diona Fanti   HPI: Mr. Knotts is a 65 year old male with a past medical history of tachy mediated cardiomyopathy, persistent atrial fibrillation (on Eliquis), systolic CHF (EF 16-10%) and CKD 3b.   He was first diagnosed with Afib in 02/2016, seen in the Afib clinic and the ED for this. He underwent 2 DC-CV and only held NSR for a few days. He started Amiodarone.   Admitted 04/16/16-04/27/16 with rapid afib and decompensation. He was started on milrinone for marginal mixed venous sat of 57% and optimization with plans for repeat DCCV after loading with IV Amiodarone. Underwent TEE/DCCV on 04/23/16 with restoration of NSR.He was continued on Eliquis for anticoagulation at discharge, as he did have some evidence of LV thrombus on Echo, however it appeared to have resolved when he had a TEE on 04/23/16. Overall diuresed 9.5L and discharge weight was 217 pounds.   July 2019 he had kidney stones and underwent lithotripsy.   Echo 7/20 EF 50-55%.   In 7/20 was doing very well. EF 50-55%  Maintaining NSR on amio 200 daily. Amio cut back to 100 daily due to sinus brady with HR in 40s. Several weeks later presented for a sick visit with recurrent AF with RVR, We reloaded amio with 400 bid and scheduled DC-CV. Several days later presented for DC-CV and was in NSR.   Underwent PVI with Dr. Rayann Heman 2/21.   Echo 8/21 EF 60-65%  Returns for routine f/u. Remains in SR off amio. Feels good. Denies SOB, orthopnea or PND. Working full time in [place]. No CP or SOB. No edema, orthopnea or PND. No bleeding with Eliquis.   Zio in 8/21  Sinus avg HR 56 with nighttime brady in 30s. 5% PVCs. No AF  Echo 3/18 EF 40-45% RV mildly HK  Echo 2/19 EF 40-45% RV mildly reduced ECHO 08/01/2018 EF 50-55%   Review of systems complete and found to be negative unless listed in HPI.   Past Medical  History:  Diagnosis Date  . Asthma, persistent   . BPH (benign prostatic hyperplasia) 2014  . CHF (congestive heart failure) ([place])   . Combined hyperlipidemia   . Dysrhythmia    ATRIAL FIBRILLATION  . GERD (gastroesophageal reflux disease)   . History of kidney stones   . Hypertension   . 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 Obesity   . Persistent atrial fibrillation ([place])   . Renal stone 04/2012  . Seasonal allergic rhinitis   . Sleep apnea    does not use a cpap machine    Current Outpatient Medications  Medication Sig Dispense Refill  . acetaminophen (TYLENOL) 325 MG tablet Take 2 tablets (650 mg total) by mouth every 6 (six) hours as needed for mild pain or headache.    . albuterol (PROVENTIL HFA;VENTOLIN HFA) 108 (90 Base) MCG/ACT inhaler Inhale 2 puffs into the lungs every 6 (six) hours as needed for wheezing or shortness of breath.    Marland Kitchen apixaban (ELIQUIS) 5 MG TABS tablet Take 1 tablet (5 mg total) by mouth 2 (two) times daily. 180 tablet 0  . Fluticasone-Salmeterol (ADVAIR) 250-50 MCG/DOSE AEPB Inhale 1 puff into the lungs 2 (two) times daily.     . furosemide (LASIX) 40 MG tablet Take one tablet by mouth on Mondays and Fridays    . pantoprazole (PROTONIX) 40 MG tablet Take 40 mg  by mouth daily.    . potassium chloride (KLOR-CON) 10 MEQ tablet Take one tablet by mouth daily    . sacubitril-valsartan (ENTRESTO) 97-103 MG Take 1 tablet by mouth 2 (two) times daily. 180 tablet 1  . tadalafil (CIALIS) 5 MG tablet Take 5 mg by mouth daily.      No current facility-administered medications for this encounter.    Allergies  Allergen Reactions  . Ibuprofen Other (See Comments)    Has a-Fib, unable to take  . Isosorbide Other (See Comments)    HEADACHES from Imdur  . Lisinopril Cough  . Digoxin And Related Rash      Social History   Socioeconomic History  . Marital status: Married    Spouse name: Not on file  . Number of children: Not on file  . Years of  education: Not on file  . Highest education level: Not on file  Occupational History  . Occupation: GREENHOUSE MANAGEMENT  Tobacco Use  . Smoking status: Never Smoker  . Smokeless tobacco: Never Used  Vaping Use  . Vaping Use: Never used  Substance and Sexual Activity  . Alcohol use: No  . Drug use: No  . Sexual activity: Not on file  Other Topics Concern  . Not on file  Social History Narrative   Lives in [place] with his spouse   He works as a Energy manager: Not on [employer] Insecurity: Not on file  Transportation Needs: Not on file  Physical Activity: Not on file  Stress: Not on file  Social Connections: Not on file  Intimate Partner Violence: Not on file      Family History  Problem Relation Age of Onset  . Hypertension Mother     Vitals:   02/01/20 1503  BP: 138/90  Pulse: (!) 46  SpO2: 95%  Weight: 104.8 kg (231 lb)   Wt Readings from Last 3 Encounters:  02/01/20 104.8 kg (231 lb)  12/17/19 103.7 kg (228 lb 9.6 oz)  09/21/19 100.2 kg (221 lb)     PHYSICAL EXAM: General:  Well appearing. No resp difficulty HEENT: normal Neck: supple. no JVD. Carotids 2+ bilat; no bruits. No lymphadenopathy or thryomegaly appreciated. Cor: PMI nondisplaced. Irregular rate & rhythm. No rubs, gallops or murmurs. Lungs: clear Abdomen: obesesoft, nontender, nondistended. No hepatosplenomegaly. No bruits or masses. Good bowel sounds. Extremities: no cyanosis, clubbing, rash, edema Neuro: alert & orientedx3, cranial nerves grossly intact. moves all 4 extremities w/o difficulty. Affect pleasant  EKG: Sinus 79 RBBB Frequent PACs and 1 PVCsPersonally reviewed   ASSESSMENT & PLAN: 1. Chronic systolic CHF: EF 81-85%.  NICM, felt to be tachy mediated. ? AF. - Echo 06/2016 LVEF 40%. - Echo 02/2017: EF 40-45% - ECHO 7/20 EF 50-55%.   - Echo 8/21 EF 60-65% - Doing great. NYHA I  - Volume status ok off lasix -  Continue Entresto to 97/103 BID. - No BB with bradycardia. - We had previously discussed proceeding with coronary angio to exclude CAD but as he is doing well and EF has recovered, I do not foresee a reason to proceed particularly with CKD  2. Paroxsymal atrial fibrillation - Amio cut back to 100 daily in July due to sinus bradycardia and developed AF with RVR - Amio reloaded and converted to NSR - s/p PVI 2/21 (Dr. Rayann Heman). Amio stopped -  Maintaining SR off amio. Bradycardia has improved off amio but having frequent PACs/PVCs -  Zio in 8/21  Sinus avg HR 56 with nighttime brady in 30s. 5% PVCs. No AF - Continue Eliquis. No bleeding issues.  - Follows with Dr. Rayann Heman  3. Hypertension - Blood pressure well controlled. Continue current regimen.  4. History of LV thrombus - Resolved. No change.   5. Chronic kidney disease stage IIIb:  - baseline creatinine 1.5-2.0.  - stable at 1.6 in 8/21  6. OSA - Sleep study 4/18 with mild OSA. AHI 13.8/hr.  - Has never gotten CPAP - > will need to revisit.   Glori Bickers, MD  10:16 PM  01/31/20

## 2020-02-01 ENCOUNTER — Encounter (HOSPITAL_COMMUNITY): Payer: Self-pay | Admitting: Internal Medicine

## 2020-02-01 ENCOUNTER — Other Ambulatory Visit: Payer: Self-pay

## 2020-02-01 ENCOUNTER — Ambulatory Visit (HOSPITAL_COMMUNITY)
Admission: RE | Admit: 2020-02-01 | Discharge: 2020-02-01 | Disposition: A | Discharge status: Home / Self Care | Payer: 59 | Source: Ambulatory Visit | Attending: Internal Medicine | Admitting: Internal Medicine

## 2020-02-01 VITALS — BP 138/90 | HR 79 | Wt 231.0 lb

## 2020-02-01 DIAGNOSIS — I5022 Chronic systolic (congestive) heart failure: Secondary | ICD-10-CM | POA: Diagnosis not present

## 2020-02-01 DIAGNOSIS — I480 Paroxysmal atrial fibrillation: Secondary | ICD-10-CM

## 2020-02-01 DIAGNOSIS — Z87442 Personal history of urinary calculi: Secondary | ICD-10-CM | POA: Insufficient documentation

## 2020-02-01 DIAGNOSIS — Z7951 Long term (current) use of inhaled steroids: Secondary | ICD-10-CM | POA: Diagnosis not present

## 2020-02-01 DIAGNOSIS — Z7901 Long term (current) use of anticoagulants: Secondary | ICD-10-CM | POA: Diagnosis not present

## 2020-02-01 DIAGNOSIS — I130 Hypertensive heart and chronic kidney disease with heart failure and stage 1 through stage 4 chronic kidney disease, or unspecified chronic kidney disease: Secondary | ICD-10-CM | POA: Diagnosis present

## 2020-02-01 DIAGNOSIS — Z886 Allergy status to analgesic agent status: Secondary | ICD-10-CM | POA: Diagnosis not present

## 2020-02-01 DIAGNOSIS — Z86718 Personal history of other venous thrombosis and embolism: Secondary | ICD-10-CM | POA: Insufficient documentation

## 2020-02-01 DIAGNOSIS — Z79899 Other long term (current) drug therapy: Secondary | ICD-10-CM | POA: Diagnosis not present

## 2020-02-01 DIAGNOSIS — J45909 Unspecified asthma, uncomplicated: Secondary | ICD-10-CM | POA: Insufficient documentation

## 2020-02-01 DIAGNOSIS — Z888 Allergy status to other drugs, medicaments and biological substances status: Secondary | ICD-10-CM | POA: Diagnosis not present

## 2020-02-01 DIAGNOSIS — N1832 Chronic kidney disease, stage 3b: Secondary | ICD-10-CM | POA: Insufficient documentation

## 2020-02-01 DIAGNOSIS — I428 Other cardiomyopathies: Secondary | ICD-10-CM | POA: Diagnosis not present

## 2020-02-01 DIAGNOSIS — G4733 Obstructive sleep apnea (adult) (pediatric): Secondary | ICD-10-CM | POA: Diagnosis not present

## 2020-02-01 DIAGNOSIS — I4510 Unspecified right bundle-branch block: Secondary | ICD-10-CM | POA: Insufficient documentation

## 2020-02-01 DIAGNOSIS — Z8249 Family history of ischemic heart disease and other diseases of the circulatory system: Secondary | ICD-10-CM | POA: Insufficient documentation

## 2020-02-01 NOTE — Patient Instructions (Signed)
Please call our office in December 2022 for a follow up appointment

## 2020-02-17 ENCOUNTER — Telehealth: Payer: Self-pay | Admitting: Infectious Diseases

## 2020-02-17 ENCOUNTER — Other Ambulatory Visit: Payer: Self-pay | Admitting: Infectious Diseases

## 2020-02-17 DIAGNOSIS — U071 COVID-19: Secondary | ICD-10-CM

## 2020-02-17 NOTE — Progress Notes (Signed)
I connected by phone with [name] on 02/17/2020 at 2:52 PM to discuss the potential use of a new treatment for mild to moderate COVID-19 viral infection in non-hospitalized patients.  This patient is a 65 y.o. male that meets the FDA criteria for Emergency Use Authorization of COVID monoclonal antibody sotrovimab.  Has a (+) direct SARS-CoV-2 viral test result  Has mild or moderate COVID-19   Is NOT hospitalized due to COVID-19  Is within 10 days of symptom onset  Has at least one of the high risk factor(s) for progression to severe COVID-19 and/or hospitalization as defined in EUA.  Specific high risk criteria : BMI > 25, Cardiovascular disease or hypertension, Chronic Lung Disease and Other high risk medical condition per CDC:  partially vaccinated    I have spoken and communicated the following to the patient or parent/caregiver regarding COVID monoclonal antibody treatment:  1. FDA has authorized the emergency use for the treatment of mild to moderate COVID-19 in adults and pediatric patients with positive results of direct SARS-CoV-2 viral testing who are 46 years of age and older weighing at least 40 kg, and who are at high risk for progressing to severe COVID-19 and/or hospitalization.  2. The significant known and potential risks and benefits of COVID monoclonal antibody, and the extent to which such potential risks and benefits are unknown.  3. Information on available alternative treatments and the risks and benefits of those alternatives, including clinical trials.  4. Patients treated with COVID monoclonal antibody should continue to self-isolate and use infection control measures (e.g., wear mask, isolate, social distance, avoid sharing personal items, clean and disinfect "high touch" surfaces, and frequent handwashing) according to CDC guidelines.   5. The patient or parent/caregiver has the option to accept or refuse COVID monoclonal antibody treatment.  After  reviewing this information with the patient, the patient has agreed to receive one of the available covid 19 monoclonal antibodies and will be provided an appropriate fact sheet prior to infusion. Janene Madeira, NP 02/17/2020 2:52 PM

## 2020-02-17 NOTE — Telephone Encounter (Signed)
Called to discuss with patient about COVID-19 symptoms and the use of one of the available treatments for those with mild to moderate Covid symptoms and at a high risk of hospitalization.  Pt appears to qualify for outpatient treatment due to co-morbid conditions and/or a member of an at-risk group in accordance with the FDA Emergency Use Authorization.    Symptom onset: 1/20 - chills/fevers, still having some today Vaccinated: Moderna September 2021 only Booster? No  Immunocompromised?  Qualifiers: CAD, CHF, restrictive lung disease  Consented to and scheduled for Sotrovimab monoclonal Ab infusion. Text message sent with appointment details.    Juan Madeira, NP

## 2020-02-18 ENCOUNTER — Ambulatory Visit (HOSPITAL_COMMUNITY)
Admission: RE | Admit: 2020-02-18 | Discharge: 2020-02-18 | Disposition: A | Discharge status: Home / Self Care | Payer: 59 | Source: Ambulatory Visit | Attending: Pulmonary Disease | Admitting: Pulmonary Disease

## 2020-02-18 DIAGNOSIS — U071 COVID-19: Secondary | ICD-10-CM | POA: Insufficient documentation

## 2020-02-18 MED ORDER — EPINEPHRINE 0.3 MG/0.3ML IJ SOAJ: 0.3000 mg | Freq: Once | INTRAMUSCULAR | Status: DC | PRN

## 2020-02-18 MED ORDER — FAMOTIDINE IN NACL 20-0.9 MG/50ML-% IV SOLN: 20.0000 mg | Freq: Once | INTRAVENOUS | Status: DC | PRN

## 2020-02-18 MED ORDER — METHYLPREDNISOLONE SODIUM SUCC 125 MG IJ SOLR: 125.0000 mg | Freq: Once | INTRAMUSCULAR | Status: DC | PRN

## 2020-02-18 MED ORDER — SOTROVIMAB 500 MG/8ML IV SOLN
500.0000 mg | Freq: Once | INTRAVENOUS | Status: AC
  Administered 2020-02-18: 500 mg via INTRAVENOUS

## 2020-02-18 MED ORDER — SODIUM CHLORIDE 0.9 % IV SOLN: INTRAVENOUS | Status: DC | PRN

## 2020-02-18 MED ORDER — ALBUTEROL SULFATE HFA 108 (90 BASE) MCG/ACT IN AERS: 2.0000 | INHALATION_SPRAY | Freq: Once | RESPIRATORY_TRACT | Status: DC | PRN

## 2020-02-18 MED ORDER — DIPHENHYDRAMINE HCL 50 MG/ML IJ SOLN: 50.0000 mg | Freq: Once | INTRAMUSCULAR | Status: DC | PRN

## 2020-02-18 NOTE — Progress Notes (Signed)
Diagnosis: COVID-19  Physician: Dr. Patrick Wright  Procedure: Covid Infusion Clinic Med: Sotrovimab infusion - Provided patient with sotrovimab fact sheet for patients, parents, and caregivers prior to infusion.   Complications: No immediate complications noted  Discharge: Discharged home    

## 2020-02-18 NOTE — Progress Notes (Signed)
Patient reviewed Fact Sheet for Patients, Parents, and Caregivers for Emergency Use Authorization (EUA) of sotrovimab for the Treatment of Coronavirus. Patient also reviewed and is agreeable to the estimated cost of treatment. Patient is agreeable to proceed.   

## 2020-02-18 NOTE — Discharge Instructions (Signed)

## 2020-03-21 ENCOUNTER — Encounter: Payer: Self-pay | Admitting: Cardiology

## 2020-03-21 ENCOUNTER — Other Ambulatory Visit: Payer: Self-pay

## 2020-03-21 ENCOUNTER — Ambulatory Visit: Payer: 59 | Admitting: Cardiology

## 2020-03-21 ENCOUNTER — Ambulatory Visit (INDEPENDENT_AMBULATORY_CARE_PROVIDER_SITE_OTHER): Payer: 59 | Admitting: Cardiology

## 2020-03-21 VITALS — BP 122/80 | HR 63 | Ht 72.0 in | Wt 227.4 lb

## 2020-03-21 DIAGNOSIS — G4733 Obstructive sleep apnea (adult) (pediatric): Secondary | ICD-10-CM | POA: Diagnosis not present

## 2020-03-21 DIAGNOSIS — I10 Essential (primary) hypertension: Secondary | ICD-10-CM | POA: Diagnosis not present

## 2020-03-21 NOTE — Patient Instructions (Signed)
Medication Instructions:  Your physician recommends that you continue on your current medications as directed. Please refer to the Current Medication list given to you today.  *If you need a refill on your cardiac medications before your next appointment, please call your pharmacy*   Lab Work: none If you have labs (blood work) drawn today and your tests are completely normal, you will receive your results only by: Marland Kitchen MyChart Message (if you have MyChart) OR . A paper copy in the mail If you have any lab test that is abnormal or we need to change your treatment, we will call you to review the results.   Testing/Procedures: Your provider would like you to have a CPAP titration, in lab study completed.

## 2020-03-21 NOTE — Progress Notes (Signed)
Cardiology Office Note:    Date:  03/21/2020   ID:  GOLDEN MULDERIG, DOB 22-Jan-1956, MRN [id number]  PCP:  [name], MD  Cardiologist:  No primary care provider on file.    Referring MD: Jolaine Artist, MD   Chief Complaint  Patient presents with   Sleep Apnea   Hypertension    History of Present Illness:    [name] is a 65 y.o. male with a hx of asthma, CHF followed in AHF clinic, PAF, GERD, HTN and OSA.  He was initially dx with OSA by sleep study in 2018 done due to excessive daytime sleepiness and atrial fibrillation.  His split night sleep study showed mild OSA with an AHI of 13.8/hr overall and mild central sleep apnea with a CAI of 6.7/hr.  There was not enough time during the study to adequately titrate his CPAP and full night CPAP titration was ordered.  He never proceeded with the titration and then he thinks that his PCP ordered a CPAP device for him a year ago but stopped using it a month ago.  He says that he cannot sleep with the device.  He tells me that the full face mask is uncomfortable and he pulls it off at night.  He feels like he does not get enough air as well.  Dr. Haroldine Laws has now referred him to sleep medicine for further treatment.   Past Medical History:  Diagnosis Date   Asthma, persistent    BPH (benign prostatic hyperplasia) 2014   CHF (congestive heart failure) ([facility])    Combined hyperlipidemia    Dysrhythmia    ATRIAL FIBRILLATION   GERD (gastroesophageal reflux disease)    History of kidney stones    Hypertension    Lymphocytic colitis 07/2011   MICROSCOPIC   Obesity    Persistent atrial fibrillation ([place])    Renal stone 04/2012   Seasonal allergic rhinitis    Sleep apnea    does not use a cpap machine    Past Surgical History:  Procedure Laterality Date   ATRIAL FIBRILLATION ABLATION N/A 03/16/2019   Procedure: ATRIAL FIBRILLATION ABLATION;  Surgeon: Thompson Grayer, MD;  Location: [facility];  Service:  Cardiovascular;  Laterality: N/A;   CARDIOVERSION N/A 04/23/2016   Procedure: CARDIOVERSION;  Surgeon: Jolaine Artist, MD;  Location: [facility] ENDOSCOPY;  Service: Cardiovascular;  Laterality: N/A;   CYSTOSCOPY WITH RETROGRADE PYELOGRAM, URETEROSCOPY AND STENT PLACEMENT Left 07/29/2017   Procedure: CYSTOSCOPY WITH RETROGRADE PYELOGRAM AND LEFT STENT PLACEMENT;  Surgeon: Franchot Gallo, MD;  Location: [facility];  Service: Urology;  Laterality: Left;   EXTRACORPOREAL SHOCK WAVE LITHOTRIPSY Left 08/08/2017   Procedure: LEFT EXTRACORPOREAL SHOCK WAVE LITHOTRIPSY (ESWL);  Surgeon: Nickie Retort, MD;  Location: [facility];  Service: Urology;  Laterality: Left;   TEE WITHOUT CARDIOVERSION N/A 04/23/2016   Procedure: TRANSESOPHAGEAL ECHOCARDIOGRAM (TEE);  Surgeon: Jolaine Artist, MD;  [facility];  Service: Cardiovascular;  Laterality: N/A;   XI ROBOTIC ASSISTED SIMPLE PROSTATECTOMY N/A 12/25/2018   Procedure: XI ROBOTIC ASSISTED SIMPLE PROSTATECTOMY, REMOVAL OF BLADDER STONES;  Surgeon: Cleon Gustin, MD;  Location: [facility];  Service: Urology;  Laterality: N/A;    Current Medications: Current Meds  Medication Sig   acetaminophen (TYLENOL) 325 MG tablet Take 2 tablets (650 mg total) by mouth every 6 (six) hours as needed for mild pain or headache.   albuterol (PROVENTIL HFA;VENTOLIN HFA) 108 (90 Base) MCG/ACT inhaler Inhale 2 puffs into the lungs  every 6 (six) hours as needed for wheezing or shortness of breath.   apixaban (ELIQUIS) 5 MG TABS tablet Take 1 tablet (5 mg total) by mouth 2 (two) times daily.   Fluticasone-Salmeterol (ADVAIR) 250-50 MCG/DOSE AEPB Inhale 1 puff into the lungs 2 (two) times daily.    furosemide (LASIX) 40 MG tablet Take one tablet by mouth on Mondays and Fridays   pantoprazole (PROTONIX) 40 MG tablet Take 40 mg by mouth daily.   potassium chloride (KLOR-CON) 10 MEQ tablet Take one tablet by mouth daily   sacubitril-valsartan (ENTRESTO) 97-103 MG  Take 1 tablet by mouth 2 (two) times daily.   tadalafil (CIALIS) 5 MG tablet Take 5 mg by mouth daily.     Allergies:   Ibuprofen, Isosorbide, Lisinopril, and Digoxin and related   Social History   Socioeconomic History   Marital status: Married    Spouse name: Not on file   Number of children: Not on file   Years of education: Not on file   Highest education level: Not on file  Occupational History   Occupation: GREENHOUSE MANAGEMENT  Tobacco Use   Smoking status: Never Smoker   Smokeless tobacco: Never Used  Vaping Use   Vaping Use: Never used  Substance and Sexual Activity   Alcohol use: No   Drug use: No   Sexual activity: Not on file  Other Topics Concern   Not on file  Social History Narrative   Lives in [place] with his spouse   He works as a Research scientist (life sciences) Strain: Not on Art therapist Insecurity: Not on file  Transportation Needs: Not on file  Physical Activity: Not on file  Stress: Not on file  Social Connections: Not on file     Family History: The patient's family history includes Hypertension in his mother.  ROS:   Please see the history of present illness.    ROS  All other systems reviewed and negative.   EKGs/Labs/Other Studies Reviewed:    The following studies were reviewed today: Sleep study from 2018  EKG:  EKG is  ordered today.  Recent Labs: 09/21/2019: BUN 19; Creatinine, Ser 1.60; Hemoglobin 11.6; Magnesium 2.2; Platelets 301; Potassium 3.5; Sodium 143   Recent Lipid Panel No results found for: CHOL, TRIG, HDL, CHOLHDL, VLDL, LDLCALC, LDLDIRECT  CHA2DS2-VASc Score = 2 [CHF History: Yes, HTN History: Yes, Diabetes History: No, Stroke History: No, Vascular Disease History: No].  Therefore, the patient's annual risk of stroke is 2.2 %.        Physical Exam:    VS:  BP 122/80    Pulse 63    Ht 6' (1.829 m)    Wt 227 lb 6.4 oz (103.1 kg)    SpO2 97%    BMI 30.84 kg/m      Wt Readings from Last 3 Encounters:  03/21/20 227 lb 6.4 oz (103.1 kg)  02/01/20 231 lb (104.8 kg)  12/17/19 228 lb 9.6 oz (103.7 kg)     GEN:  Well nourished, well developed in no acute distress HEENT: Normal NECK: No JVD; No carotid bruits LYMPHATICS: No lymphadenopathy CARDIAC: RRR, no murmurs, rubs, gallops RESPIRATORY:  Clear to auscultation without rales, wheezing or rhonchi  ABDOMEN: Soft, non-tender, non-distended MUSCULOSKELETAL:  No edema; No deformity  SKIN: Warm and dry NEUROLOGIC:  Alert and oriented x 3 PSYCHIATRIC:  Normal affect   ASSESSMENT:    1. OSA (obstructive sleep apnea)  2. Essential hypertension    PLAN:    In order of problems listed above:  1.  OSA -he has a sleep study done in 2018 showing overall mild OSA with an AHI of almost 14/hr and mild central sleep apnea with inadequate time for titration -PCP placed him on auto CPAP but he feels like he is smothering and not getting enough air and does not tolerate the FFM -I have recommended that he go back for a CPAP titration in lab study as he may need BIPAP since he does not feel good on auto CPAP  2.  HTN -BP controlled on exam today -continue ENtresto   Medication Adjustments/Labs and Tests Ordered: Current medicines are reviewed at length with the patient today.  Concerns regarding medicines are outlined above.  No orders of the defined types were placed in this encounter.  No orders of the defined types were placed in this encounter.   Signed, Fransico Him, MD  03/21/2020 3:28 PM    [place]

## 2020-04-01 ENCOUNTER — Telehealth: Payer: Self-pay | Admitting: *Deleted

## 2020-04-01 DIAGNOSIS — G4733 Obstructive sleep apnea (adult) (pediatric): Secondary | ICD-10-CM

## 2020-04-01 NOTE — Telephone Encounter (Signed)
-----   Message from Para March, RN sent at 03/21/2020  3:32 PM EST ----- Dr. Radford Pax would like an in lab CPAP titration for this patient.   Thanks!

## 2020-04-01 NOTE — Telephone Encounter (Signed)
CPAP titration PA submitted to [facility]  via [employer].Marland Kitchen

## 2020-04-16 ENCOUNTER — Other Ambulatory Visit (HOSPITAL_COMMUNITY): Payer: Self-pay | Admitting: *Deleted

## 2020-04-16 MED ORDER — APIXABAN 5 MG PO TABS: 5.0000 mg | ORAL_TABLET | Freq: Two times a day (BID) | ORAL | 3 refills | Status: DC

## 2020-04-16 MED ORDER — ENTRESTO 97-103 MG PO TABS: 1.0000 | ORAL_TABLET | Freq: Two times a day (BID) | ORAL | 3 refills | Status: DC

## 2020-05-22 NOTE — Addendum Note (Signed)
Addended by: Freada Bergeron on: 05/22/2020 03:37 PM   Modules accepted: Orders

## 2020-06-16 NOTE — Telephone Encounter (Signed)
approval code [id number]. Date change request faxed to [facility] on 06/16/20.

## 2020-07-11 ENCOUNTER — Emergency Department (HOSPITAL_COMMUNITY): Payer: 59

## 2020-07-11 ENCOUNTER — Encounter (HOSPITAL_COMMUNITY): Payer: Self-pay | Admitting: Physician Assistant

## 2020-07-11 ENCOUNTER — Encounter (HOSPITAL_COMMUNITY): Payer: Self-pay | Admitting: Emergency Medicine

## 2020-07-11 ENCOUNTER — Ambulatory Visit (HOSPITAL_BASED_OUTPATIENT_CLINIC_OR_DEPARTMENT_OTHER)
Admission: RE | Admit: 2020-07-11 | Discharge: 2020-07-11 | Disposition: A | Discharge status: Home / Self Care | Payer: 59 | Source: Ambulatory Visit | Attending: Physician Assistant | Admitting: Physician Assistant

## 2020-07-11 ENCOUNTER — Other Ambulatory Visit: Payer: Self-pay

## 2020-07-11 ENCOUNTER — Emergency Department (HOSPITAL_COMMUNITY)
Admission: EM | Admit: 2020-07-11 | Discharge: 2020-07-11 | Disposition: A | Discharge status: Home / Self Care | Payer: 59 | Attending: Emergency Medicine | Admitting: Emergency Medicine

## 2020-07-11 VITALS — BP 146/92 | HR 157 | Ht 72.0 in | Wt 232.4 lb

## 2020-07-11 DIAGNOSIS — I4891 Unspecified atrial fibrillation: Secondary | ICD-10-CM

## 2020-07-11 DIAGNOSIS — Z79899 Other long term (current) drug therapy: Secondary | ICD-10-CM | POA: Insufficient documentation

## 2020-07-11 DIAGNOSIS — I10 Essential (primary) hypertension: Secondary | ICD-10-CM | POA: Insufficient documentation

## 2020-07-11 DIAGNOSIS — I110 Hypertensive heart disease with heart failure: Secondary | ICD-10-CM | POA: Diagnosis not present

## 2020-07-11 DIAGNOSIS — J4530 Mild persistent asthma, uncomplicated: Secondary | ICD-10-CM | POA: Insufficient documentation

## 2020-07-11 DIAGNOSIS — E669 Obesity, unspecified: Secondary | ICD-10-CM | POA: Insufficient documentation

## 2020-07-11 DIAGNOSIS — I5021 Acute systolic (congestive) heart failure: Secondary | ICD-10-CM | POA: Insufficient documentation

## 2020-07-11 DIAGNOSIS — D6869 Other thrombophilia: Secondary | ICD-10-CM | POA: Insufficient documentation

## 2020-07-11 DIAGNOSIS — Z6831 Body mass index (BMI) 31.0-31.9, adult: Secondary | ICD-10-CM | POA: Insufficient documentation

## 2020-07-11 DIAGNOSIS — I429 Cardiomyopathy, unspecified: Secondary | ICD-10-CM | POA: Insufficient documentation

## 2020-07-11 DIAGNOSIS — I4819 Other persistent atrial fibrillation: Secondary | ICD-10-CM | POA: Insufficient documentation

## 2020-07-11 DIAGNOSIS — Z7951 Long term (current) use of inhaled steroids: Secondary | ICD-10-CM | POA: Insufficient documentation

## 2020-07-11 DIAGNOSIS — R000 Tachycardia, unspecified: Secondary | ICD-10-CM | POA: Insufficient documentation

## 2020-07-11 DIAGNOSIS — I4510 Unspecified right bundle-branch block: Secondary | ICD-10-CM | POA: Insufficient documentation

## 2020-07-11 DIAGNOSIS — G4733 Obstructive sleep apnea (adult) (pediatric): Secondary | ICD-10-CM | POA: Insufficient documentation

## 2020-07-11 DIAGNOSIS — Z7901 Long term (current) use of anticoagulants: Secondary | ICD-10-CM | POA: Insufficient documentation

## 2020-07-11 DIAGNOSIS — R0602 Shortness of breath: Secondary | ICD-10-CM | POA: Diagnosis present

## 2020-07-11 LAB — CBC
HCT: 35.4 % — ABNORMAL LOW (ref 39.0–52.0)
Hemoglobin: 11 g/dL — ABNORMAL LOW (ref 13.0–17.0)
MCH: 24.6 pg — ABNORMAL LOW (ref 26.0–34.0)
MCHC: 31.1 g/dL (ref 30.0–36.0)
MCV: 79.2 fL — ABNORMAL LOW (ref 80.0–100.0)
Platelets: 303 10*3/uL (ref 150–400)
RBC: 4.47 MIL/uL (ref 4.22–5.81)
RDW: 18 % — ABNORMAL HIGH (ref 11.5–15.5)
WBC: 9 10*3/uL (ref 4.0–10.5)
nRBC: 0 % (ref 0.0–0.2)

## 2020-07-11 LAB — BASIC METABOLIC PANEL
Anion gap: 7 (ref 5–15)
BUN: 18 mg/dL (ref 8–23)
CO2: 24 mmol/L (ref 22–32)
Calcium: 8.9 mg/dL (ref 8.9–10.3)
Chloride: 107 mmol/L (ref 98–111)
Creatinine, Ser: 1.67 mg/dL — ABNORMAL HIGH (ref 0.61–1.24)
GFR, Estimated: 45 mL/min — ABNORMAL LOW (ref >60)
Glucose, Bld: 99 mg/dL (ref 70–99)
Potassium: 3.5 mmol/L (ref 3.5–5.1)
Sodium: 138 mmol/L (ref 135–145)

## 2020-07-11 LAB — MAGNESIUM: Magnesium: 2.1 mg/dL (ref 1.7–2.4)

## 2020-07-11 MED ORDER — PROPOFOL 10 MG/ML IV BOLUS
0.5000 mg/kg | Freq: Once | INTRAVENOUS | Status: DC
  Filled 2020-07-11: qty 20

## 2020-07-11 MED ORDER — AMIODARONE HCL 100 MG PO TABS: 100.0000 mg | ORAL_TABLET | Freq: Every day | ORAL | 0 refills | Status: DC

## 2020-07-11 MED ORDER — AMIODARONE HCL 200 MG PO TABS
100.0000 mg | ORAL_TABLET | Freq: Once | ORAL | Status: AC
  Administered 2020-07-11: 100 mg via ORAL
  Filled 2020-07-11: qty 1

## 2020-07-11 MED ORDER — PROPOFOL 10 MG/ML IV BOLUS
INTRAVENOUS | Status: AC | PRN
  Administered 2020-07-11: 52.4 mg via INTRAVENOUS

## 2020-07-11 NOTE — ED Provider Notes (Signed)
[facility] EMERGENCY DEPARTMENT Provider Note   CSN: [id number] Arrival date & time: 07/11/20  [id number]     History No chief complaint on file.   [name] is a 65 y.o. male past medical history of paroxysmal atrial fibrillation on Eliquis, CHF, asthma, presenting for evaluation of A. fib with RVR from cardiology clinic today.  Presented for follow-up though was complaining of 2 to 3 days of shortness of breath.  Has not missed any doses of Eliquis.  Denies any chest pain or obvious palpitations.  Denies nausea or vomiting, diaphoresis.  Reports may be some minimal new lower extremity edema.  Per review of medical record, patient was seen today in A. fib clinic and recommended he come to the ED for A. fib with RVR, suggested cardioversion as treatment.  The history is provided by the patient and medical records.      Past Medical History:  Diagnosis Date   Asthma, persistent    BPH (benign prostatic hyperplasia) 2014   CHF (congestive heart failure) (HCC)    Combined hyperlipidemia    Dysrhythmia    ATRIAL FIBRILLATION   GERD (gastroesophageal reflux disease)    History of kidney stones    Hypertension    Lymphocytic colitis 07/2011   MICROSCOPIC   Obesity    Persistent atrial fibrillation ([place])    Renal stone 04/2012   Seasonal allergic rhinitis    Sleep apnea    does not use a cpap machine    Patient Active Problem List   Diagnosis Date Noted   Persistent atrial fibrillation ([place]) 04/16/2019   Secondary hypercoagulable state ([place]) 04/16/2019   BPH with obstruction/lower urinary tract symptoms [id number]   Acute systolic heart failure ([place]) 04/16/2016    Past Surgical History:  Procedure Laterality Date   ATRIAL FIBRILLATION ABLATION N/A 03/16/2019   Procedure: ATRIAL FIBRILLATION ABLATION;  Surgeon: Thompson Grayer, MD;  Location: [facility] LAB;  Service: Cardiovascular;  Laterality: N/A;   CARDIOVERSION N/A 04/23/2016   Procedure:  CARDIOVERSION;  Surgeon: Jolaine Artist, MD;  Location: [facility] ENDOSCOPY;  Service: Cardiovascular;  Laterality: N/A;   CYSTOSCOPY WITH RETROGRADE PYELOGRAM, URETEROSCOPY AND STENT PLACEMENT Left 07/29/2017   Procedure: CYSTOSCOPY WITH RETROGRADE PYELOGRAM AND LEFT STENT PLACEMENT;  Surgeon: Franchot Gallo, MD;  Location: [facility] ORS;  Service: Urology;  Laterality: Left;   EXTRACORPOREAL SHOCK WAVE LITHOTRIPSY Left 08/08/2017   Procedure: LEFT EXTRACORPOREAL SHOCK WAVE LITHOTRIPSY (ESWL);  Surgeon: Nickie Retort, MD;  Location: [facility] ORS;  Service: Urology;  Laterality: Left;   TEE WITHOUT CARDIOVERSION N/A 04/23/2016   Procedure: TRANSESOPHAGEAL ECHOCARDIOGRAM (TEE);  Surgeon: Jolaine Artist, MD;  Location: [facility] ENDOSCOPY;  Service: Cardiovascular;  Laterality: N/A;   XI ROBOTIC ASSISTED SIMPLE PROSTATECTOMY N/A 12/25/2018   Procedure: XI ROBOTIC ASSISTED SIMPLE PROSTATECTOMY, REMOVAL OF BLADDER STONES;  Surgeon: Cleon Gustin, MD;  Location: [facility] ORS;  Service: Urology;  Laterality: N/A;       Family History  Problem Relation Age of Onset   Hypertension Mother     Social History   Tobacco Use   Smoking status: Never   Smokeless tobacco: Never  Vaping Use   Vaping Use: Never used  Substance Use Topics   Alcohol use: No   Drug use: No    Home Medications Prior to Admission medications   Medication Sig Start Date End Date Taking? Authorizing Provider  acetaminophen (TYLENOL) 325 MG tablet Take 2 tablets (650 mg total) by mouth every 6 (six)  hours as needed for mild pain or headache. 04/27/16  Yes Shirley Friar, PA-C  albuterol (PROVENTIL HFA;VENTOLIN HFA) 108 (90 Base) MCG/ACT inhaler Inhale 2 puffs into the lungs every 6 (six) hours as needed for wheezing or shortness of breath.   Yes [provider]  apixaban (ELIQUIS) 5 MG TABS tablet Take 1 tablet (5 mg total) by mouth 2 (two) times daily. 04/16/20  Yes Bensimhon, Shaune Pascal, MD  Fluticasone-Salmeterol  (ADVAIR) 250-50 MCG/DOSE AEPB Inhale 1 puff into the lungs 2 (two) times daily.    Yes [provider]  furosemide (LASIX) 40 MG tablet Take one tablet by mouth on Mondays and Fridays Patient taking differently: Take 40 mg by mouth See admin instructions. Take one tablet by mouth on Mondays and Fridays 08/29/19  Yes Bensimhon, Shaune Pascal, MD  pantoprazole (PROTONIX) 40 MG tablet Take 40 mg by mouth daily.   Yes [provider]  potassium chloride (KLOR-CON) 10 MEQ tablet Take one tablet by mouth daily Patient taking differently: Take 10 mEq by mouth daily. 08/29/19  Yes Bensimhon, Shaune Pascal, MD  sacubitril-valsartan (ENTRESTO) 97-103 MG Take 1 tablet by mouth 2 (two) times daily. 04/16/20  Yes Bensimhon, Shaune Pascal, MD  tadalafil (CIALIS) 5 MG tablet Take 5 mg by mouth daily.   Yes [provider]  amiodarone (PACERONE) 100 MG tablet Take 1 tablet (100 mg total) by mouth daily. 07/11/20   Latacha Texeira, [place] N, PA-C    Allergies    Ibuprofen, Isosorbide, Lisinopril, and Digoxin and related  Review of Systems   Review of Systems  Respiratory:  Positive for shortness of breath.   Cardiovascular:  Positive for leg swelling.  All other systems reviewed and are negative.  Physical Exam Updated Vital Signs BP (!) 142/106   Pulse 75   Temp 99.8 F (37.7 C)   Resp (!) 25   Ht 6' (1.829 m)   Wt 104.8 kg   SpO2 99%   BMI 31.33 kg/m   Physical Exam Vitals and nursing note reviewed.  Constitutional:      Appearance: He is well-developed.  HENT:     Head: Normocephalic and atraumatic.  Eyes:     Conjunctiva/sclera: Conjunctivae normal.  Cardiovascular:     Rate and Rhythm: Tachycardia present. Rhythm irregular.  Pulmonary:     Effort: Pulmonary effort is normal. No respiratory distress.     Breath sounds: Normal breath sounds.  Abdominal:     General: Bowel sounds are normal.     Palpations: Abdomen is soft.     Tenderness: There is no abdominal tenderness.   Musculoskeletal:     Right lower leg: No edema.     Left lower leg: No edema.  Skin:    General: Skin is warm.  Neurological:     Mental Status: He is alert.  Psychiatric:        Behavior: Behavior normal.    ED Results / Procedures / Treatments   Labs (all labs ordered are listed, but only abnormal results are displayed) Labs Reviewed  BASIC METABOLIC PANEL - Abnormal; Notable for the following components:      Result Value   Creatinine, Ser 1.67 (*)    GFR, Estimated 45 (*)    All other components within normal limits  CBC - Abnormal; Notable for the following components:   Hemoglobin 11.0 (*)    HCT 35.4 (*)    MCV 79.2 (*)    MCH 24.6 (*)    RDW 18.0 (*)  All other components within normal limits  MAGNESIUM    EKG EKG Interpretation  Date/Time:  Friday July 11 2020 10:12:06 EDT Ventricular Rate:  146 PR Interval:    QRS Duration: 148 QT Interval:  332 QTC Calculation: 518 R Axis:   96 Text Interpretation: Atrial fibrillation RBBB and LPFB Confirmed by Quintella Reichert [phone]) on 07/11/2020 11:03:49 AM  Radiology DG Chest 2 View  Result Date: 07/11/2020 CLINICAL DATA:  Short of breath and atrial fibrillation EXAM: CHEST - 2 VIEW COMPARISON:  05/25/2016 FINDINGS: The heart size and mediastinal contours are within normal limits. Both lungs are clear. The visualized skeletal structures are unremarkable. IMPRESSION: No active cardiopulmonary disease. Electronically Signed   By: Franchot Gallo M.D.   On: 07/11/2020 10:54    Procedures .Critical Care  Date/Time: 07/11/2020 1:11 PM Performed by: Rockey Guarino, [place] N, PA-C Authorized by: Yaretsi Humphres, [place] N, PA-C   Critical care provider statement:    Critical care time (minutes):  45   Critical care time was exclusive of:  Separately billable procedures and treating other patients and teaching time   Critical care was necessary to treat or prevent imminent or life-threatening deterioration of the following  conditions: a fib with rvr- cardioversion.   Critical care was time spent personally by me on the following activities:  Discussions with consultants, evaluation of patient's response to treatment, examination of patient, ordering and performing treatments and interventions, ordering and review of laboratory studies, ordering and review of radiographic studies, pulse oximetry, re-evaluation of patient's condition, obtaining history from patient or surrogate and review of old charts   I assumed direction of critical care for this patient from another provider in my specialty: no     Medications Ordered in ED Medications  propofol (DIPRIVAN) 10 mg/mL bolus/IV push 52.4 mg (has no administration in time range)  propofol (DIPRIVAN) 10 mg/mL bolus/IV push (52.4 mg Intravenous Given 07/11/20 1137)  amiodarone (PACERONE) tablet 100 mg (has no administration in time range)    ED Course  I have reviewed the triage vital signs and the nursing notes.  Pertinent labs & imaging results that were available during my care of the patient were reviewed by me and considered in my medical decision making (see chart for details).  Clinical Course as of 07/11/20 1339  Fri Jul 11, 2020  1150 Cardioverted, improved rate. Appears to be sinus rhythm with ectopy. Will consult cardiology for recommendations for restarting amiodarone vs dc [JR]    Clinical Course User Index [JR] Harneet Noblett, [place] N, PA-C   MDM Rules/Calculators/A&P                          Patient is a 65 year old male compliant on Eliquis with paroxysmal A. fib, presenting from A. fib clinic in A. fib with RVR for cardioversion.  Per review of medical record and cardiology provider note today, patient does not tolerate other rate control medications for his A. fib with RVR in the past due to hypotension therefore was recommended for cardioversion.  He is compliant on his Eliquis and seems appropriate for this.  In the past has had ablation as well as  amiodarone with improvement in his A. fib and has been in sinus rhythm per recent cardiology visits.  Symptoms began a few days ago mostly just with shortness of breath on exertion.  He is not having any chest pains.  He is in A. fib with RVR in the ED with rates around  in the 150s.  No signs of peripheral edema.  Chest x-ray is clear.  Blood work without acute significant change from baseline.  He is cardioverted under conscious sedation with attending physician Dr. Ralene Bathe.  This was done at 150 J and patient converted to sinus rhythm with normal rates, however does have a bit of ectopy.  Consulted with cardiology, Dr. Harrell Gave, who recommends restart amiodarone at 100 mg daily and follow-up with cardiology for additional management.  He states he tolerated amiodarone very well in the past.  Patient is in agreement with this care plan and will be discharged in no distress.  Discussed results, findings, treatment and follow up. Patient advised of return precautions. Patient verbalized understanding and agreed with plan.    Final Clinical Impression(s) / ED Diagnoses Final diagnoses:  Atrial fibrillation with RVR ([place])    Rx / DC Orders ED Discharge Orders          Ordered    Amb referral to AFIB Clinic        07/11/20 1003    amiodarone (PACERONE) 100 MG tablet  Daily,   Status:  Discontinued        07/11/20 1318    amiodarone (PACERONE) 100 MG tablet  Daily        07/11/20 1338             Sherry Blackard, [place] N, [place] 07/11/20 1340    Quintella Reichert, MD 07/12/20 1343

## 2020-07-11 NOTE — Sedation Documentation (Signed)
Pt A/Ox4 at this time. Pt resting comfortably, denies pain. Currently in afib with a rate of 70-80. Pt able to move all extremities well. Respirations even/unlabored.

## 2020-07-11 NOTE — Discharge Instructions (Addendum)
Please call the A. fib clinic for close follow-up and further medication management.  Remind them that you were in the emergency department today and need close follow-up. Begin taking the amiodarone, 100 mg once daily. Return to the emergency department for new or worsening symptoms

## 2020-07-11 NOTE — Sedation Documentation (Addendum)
Shock delivered at this time. Pt previously in afib RVR with a rate of 140s. Pt currently in afib with a rate of 84.

## 2020-07-11 NOTE — ED Notes (Signed)
Patient is resting comfortably. 

## 2020-07-11 NOTE — ED Triage Notes (Signed)
Pt here from afib  clinic with c/o afib rvr along with some sob times 3 days , has not missed any doses of his blood thinners

## 2020-07-11 NOTE — ED Notes (Signed)
C/o shortness of breath that started with his rapid a-fib

## 2020-07-11 NOTE — ED Notes (Signed)
Patient denies pain and is resting comfortably.  

## 2020-07-11 NOTE — Progress Notes (Signed)
Primary Care Physician: [name], MD Primary Cardiologist: Dr Johnsie Cancel Primary Electrophysiologist: Dr Rayann Heman [facility]: Dr Haroldine Laws Referring Physician: Dr Rayann Heman   [name] is a 65 y.o. male with a history of persistent atrial fibrillation, tachycardia mediated CM, CKD, HTN, OSA who presents for follow up in the [facility].  The patient was initially diagnosed with atrial fibrillation 2018 after presenting with symptoms of SOB and fatigue. 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  He initially failed cardioversion and was placed on amiodarone.  With rhythm control, his EF has improved from 20% to 50%.  He did well for 2 years with low dose amiodarone.  (amiodarone had been reduced due to sinus bradycardia). Unfortunately, he continued to have episodes of afib. He underwent afib ablation on 03/16/19 with Dr Rayann Heman. Patient is on Eliquis for a CHADS2VASC score of 2. Patient reports that he noted an irregular pulse 05/10/19 and was worried he was back in afib. ECG showed SR with PACs.  On follow up today, patient reports that about 3 days ago he started having symptoms of heart racing and SOB. There were no specific triggers that he could identify although he has not been using his CPAP recently. He is scheduled for in lab CPAP titration later this month. He has also noted ~ 6 lbs weight gain and orthopnea.   Today, he denies symptoms of chest pain, PND, lower extremity edema, presyncope, syncope, snoring, daytime somnolence, bleeding, or neurologic sequela. The patient is tolerating medications without difficulties and is otherwise without complaint today.    Atrial Fibrillation Risk Factors:  he does have symptoms or diagnosis of sleep apnea. he is compliant with CPAP therapy.   he has a BMI of Body mass index is 31.52 kg/m.Marland Kitchen Filed Weights   07/11/20 0926  Weight: 105.4 kg     Family History  Problem Relation Age of Onset   Hypertension Mother       Atrial Fibrillation Management history:  Previous antiarrhythmic drugs: amiodarone Previous cardioversions: 03/2016 Previous ablations: 03/16/19 CHADS2VASC score: 2 Anticoagulation history: Eliquis   Past Medical History:  Diagnosis Date   Asthma, persistent    BPH (benign prostatic hyperplasia) 2014   CHF (congestive heart failure) (HCC)    Combined hyperlipidemia    Dysrhythmia    ATRIAL FIBRILLATION   GERD (gastroesophageal reflux disease)    History of kidney stones    Hypertension    Lymphocytic colitis 07/2011   MICROSCOPIC   Obesity    Persistent atrial fibrillation ([place])    Renal stone 04/2012   Seasonal allergic rhinitis    Sleep apnea    does not use a cpap machine   Past Surgical History:  Procedure Laterality Date   ATRIAL FIBRILLATION ABLATION N/A 03/16/2019   Procedure: ATRIAL FIBRILLATION ABLATION;  Surgeon: Thompson Grayer, MD;  Location: [place] CV LAB;  Service: Cardiovascular;  Laterality: N/A;   CARDIOVERSION N/A 04/23/2016   Procedure: CARDIOVERSION;  Surgeon: Jolaine Artist, MD;  Location: [facility] ENDOSCOPY;  Service: Cardiovascular;  Laterality: N/A;   CYSTOSCOPY WITH RETROGRADE PYELOGRAM, URETEROSCOPY AND STENT PLACEMENT Left 07/29/2017   Procedure: CYSTOSCOPY WITH RETROGRADE PYELOGRAM AND LEFT STENT PLACEMENT;  Surgeon: Franchot Gallo, MD;  Location: [facility]S;  Service: Urology;  Laterality: Left;   EXTRACORPOREAL SHOCK WAVE LITHOTRIPSY Left 08/08/2017   Procedure: LEFT EXTRACORPOREAL SHOCK WAVE LITHOTRIPSY (ESWL);  Surgeon: Nickie Retort, MD;  Location: [facility] ORS;  Service: Urology;  Laterality: Left;   TEE  WITHOUT CARDIOVERSION N/A 04/23/2016   Procedure: TRANSESOPHAGEAL ECHOCARDIOGRAM (TEE);  Surgeon: Jolaine Artist, MD;  Location: [facility] ENDOSCOPY;  Service: Cardiovascular;  Laterality: N/A;   XI ROBOTIC ASSISTED SIMPLE PROSTATECTOMY N/A 12/25/2018   Procedure: XI ROBOTIC ASSISTED SIMPLE PROSTATECTOMY, REMOVAL OF BLADDER STONES;  Surgeon:  Cleon Gustin, MD;  Location: [facility]S;  Service: Urology;  Laterality: N/A;    Current Outpatient Medications  Medication Sig Dispense Refill   acetaminophen (TYLENOL) 325 MG tablet Take 2 tablets (650 mg total) by mouth every 6 (six) hours as needed for mild pain or headache.     albuterol (PROVENTIL HFA;VENTOLIN HFA) 108 (90 Base) MCG/ACT inhaler Inhale 2 puffs into the lungs every 6 (six) hours as needed for wheezing or shortness of breath.     apixaban (ELIQUIS) 5 MG TABS tablet Take 1 tablet (5 mg total) by mouth 2 (two) times daily. 180 tablet 3   Fluticasone-Salmeterol (ADVAIR) 250-50 MCG/DOSE AEPB Inhale 1 puff into the lungs 2 (two) times daily.      furosemide (LASIX) 40 MG tablet Take one tablet by mouth on Mondays and Fridays     pantoprazole (PROTONIX) 40 MG tablet Take 40 mg by mouth daily.     potassium chloride (KLOR-CON) 10 MEQ tablet Take one tablet by mouth daily     sacubitril-valsartan (ENTRESTO) 97-103 MG Take 1 tablet by mouth 2 (two) times daily. 180 tablet 3   tadalafil (CIALIS) 5 MG tablet Take 5 mg by mouth daily.     No current facility-administered medications for this encounter.    Allergies  Allergen Reactions   Ibuprofen Other (See Comments)    Has a-Fib, unable to take   Isosorbide Other (See Comments)    HEADACHES from Imdur   Lisinopril Cough   Digoxin And Related Rash    Social History   Socioeconomic History   Marital status: Married    Spouse name: Not on file   Number of children: Not on file   Years of education: Not on file   Highest education level: Not on file  Occupational History   Occupation: GREENHOUSE MANAGEMENT  Tobacco Use   Smoking status: Never   Smokeless tobacco: Never  Vaping Use   Vaping Use: Never used  Substance and Sexual Activity   Alcohol use: No   Drug use: No   Sexual activity: Not on file  Other Topics Concern   Not on file  Social History Narrative   Lives in [place] with his spouse   He works  as a Research scientist (life sciences) Strain: 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  Physical Exam: Vitals:   07/11/20 0926  BP: (!) 146/92  Pulse: (!) 157  SpO2: 96%  Weight: 105.4 kg  Height: 6' (1.829 m)     GEN- The patient is a well appearing obese male, alert and oriented x 3 today.   HEENT-head normocephalic, atraumatic, sclera clear, conjunctiva pink, hearing intact, trachea midline. Lungs- Clear to ausculation bilaterally, normal work of breathing Heart- irregular rate and rhythm, tachycardia, no murmurs, rubs or gallops  GI- soft, NT, ND, + BS Extremities- no clubbing, cyanosis, or edema MS-  no significant deformity or atrophy Skin- no rash or lesion Psych- euthymic mood, full affect Neuro- strength and sensation are intact   Wt Readings from Last 3 Encounters:  07/11/20 105.4 kg  03/21/20 103.1 kg  02/01/20 104.8 kg    EKG today demonstrates  Afib, RBBB Vent. rate 157 BPM PR interval * ms QRS duration 136 ms QT/QTcB 298/481 ms  Echo 08/01/18 demonstrated  1. The left ventricle has a visually estimated ejection fraction of 50%.  The cavity size was normal. Left ventricular diastolic Doppler parameters  are consistent with impaired relaxation. Left ventricular diffuse  hypokinesis.   2. The right ventricle has normal systolic function. The cavity was  normal. There is no increase in right ventricular wall thickness.   3. Left atrial size was mildly dilated.   4. Right atrial size was mildly dilated.   5. No evidence of mitral valve stenosis. Trivial mitral regurgitation.   6. The aortic valve is tricuspid. Aortic valve regurgitation is trivial  by color flow Doppler. No stenosis  of the aortic valve.   7. The aortic root is normal in size and structure.   8. The inferior vena cava was dilated in size with >50% respiratory  variability. PA systolic pressure 32 mmHg.   Epic records are reviewed at length today  CHA2DS2-VASc Score = 2 The patient's score is based upon: CHF History: Yes HTN History: Yes Age : < 65 Diabetes History: No Stroke History: No Vascular Disease History: No Gender: Male   ASSESSMENT AND PLAN: 1. Persistent Atrial Fibrillation (ICD10:  I48.19) The patient's CHA2DS2-VASc score is 2, indicating a 2.2% annual risk of stroke.   S/p afib ablation with Dr Rayann Heman 03/16/19. Patient back in rapid afib 2-3 days. His weight is up ~ 6 lbs and he is having symptoms or orthopnea. Recall he has not tolerated rate control in the past due to significant bradycardia. I am concerned about further decompensation if we pursue outpatient DCCV. Will send him to ED for urgent DCCV.  Continue Eliquis 5 mg BID, he has not missed any doses in the last 3 weeks.  ? Triggered by untreated OSA.  2. Secondary Hypercoagulable State (ICD10:  D68.69) The patient is at significant risk for stroke/thromboembolism based upon his CHA2DS2-VASc Score of 2.  Continue Apixaban (Eliquis).   3. Obstructive sleep apnea In lab titration scheduled.   4. HTN Stable, no changes today.  5. Tachycardia mediated CM EF normalized with SR. His weight is up w/ symptoms of orthopnea.  See plans above.     Patient to the ED for urgent DCCV. Follow up in the AF clinic next week post ED visit.    [facility] [address] [place], [place] [address] [phone] 07/11/2020 9:55 AM

## 2020-07-11 NOTE — ED Provider Notes (Signed)
.  Cardioversion  Date/Time: 07/11/2020 11:55 AM Performed by: Quintella Reichert, MD Authorized by: Quintella Reichert, MD   Consent:    Consent obtained:  Verbal and written   Consent given by:  Patient   Alternatives discussed:  Rate-control medication Pre-procedure details:    Cardioversion basis:  Emergent   Rhythm:  Atrial fibrillation   Electrode placement:  Anterior-posterior Patient sedated: Yes. Refer to sedation procedure documentation for details of sedation.  Attempt one:    Cardioversion mode:  Synchronous   Shock (Joules):  150   Shock outcome:  Conversion to other rhythm Post-procedure details:    Patient status:  Awake   Patient tolerance of procedure:  Tolerated well, no immediate complications .Sedation  Date/Time: 07/11/2020 11:59 AM Performed by: Quintella Reichert, MD Authorized by: Quintella Reichert, MD   Consent:    Consent obtained:  Written and verbal   Consent given by:  Patient   Risks discussed:  Dysrhythmia, inadequate sedation, nausea, vomiting, prolonged hypoxia resulting in organ damage and respiratory compromise necessitating ventilatory assistance and intubation Universal protocol:    Immediately prior to procedure, a time out was called: yes   Pre-sedation assessment:    Time since last food or drink:  12   ASA classification: class 3 - patient with severe systemic disease     Mouth opening:  3 or more finger widths   Mallampati score:  I - soft palate, uvula, fauces, pillars visible   Neck mobility: normal     Pre-sedation assessments completed and reviewed: airway patency, cardiovascular function, hydration status, mental status, nausea/vomiting, pain level and respiratory function   Immediate pre-procedure details:    Reviewed: vital signs and NPO status     Verified: bag valve mask available, emergency equipment available, intubation equipment available, IV patency confirmed, oxygen available and suction available   Procedure details (see MAR for  exact dosages):    Preoxygenation:  Nasal cannula   Sedation:  Propofol   Intra-procedure monitoring:  Blood pressure monitoring, cardiac monitor, continuous pulse oximetry, continuous capnometry, frequent LOC assessments and frequent vital sign checks   Intra-procedure events: respiratory depression     Intra-procedure management:  Airway repositioning   Total Provider sedation time (minutes):  8 Post-procedure details:    Attendance: Constant attendance by certified staff until patient recovered     Recovery: Patient returned to pre-procedure baseline     Procedure completion:  Tolerated well, no immediate complications    Quintella Reichert, MD 07/11/20 1531

## 2020-07-11 NOTE — ED Notes (Signed)
Consent signed for sedation and cardioversion at this time

## 2020-07-11 NOTE — ED Notes (Signed)
Pt verbalizes understanding of discharge instructions. Opportunity for questions and answers were provided. Pt discharged from the ED.   ?

## 2020-07-17 ENCOUNTER — Other Ambulatory Visit: Payer: Self-pay

## 2020-07-17 ENCOUNTER — Ambulatory Visit (HOSPITAL_COMMUNITY)
Admit: 2020-07-17 | Discharge: 2020-07-17 | Disposition: A | Discharge status: Home / Self Care | Payer: 59 | Source: Ambulatory Visit | Attending: Physician Assistant | Admitting: Physician Assistant

## 2020-07-17 ENCOUNTER — Encounter (HOSPITAL_COMMUNITY): Payer: Self-pay | Admitting: Physician Assistant

## 2020-07-17 VITALS — BP 138/80 | HR 72 | Ht 72.0 in | Wt 236.8 lb

## 2020-07-17 DIAGNOSIS — G4733 Obstructive sleep apnea (adult) (pediatric): Secondary | ICD-10-CM | POA: Diagnosis not present

## 2020-07-17 DIAGNOSIS — D6869 Other thrombophilia: Secondary | ICD-10-CM | POA: Diagnosis not present

## 2020-07-17 DIAGNOSIS — I4819 Other persistent atrial fibrillation: Secondary | ICD-10-CM | POA: Insufficient documentation

## 2020-07-17 DIAGNOSIS — I10 Essential (primary) hypertension: Secondary | ICD-10-CM | POA: Diagnosis not present

## 2020-07-17 DIAGNOSIS — Z8249 Family history of ischemic heart disease and other diseases of the circulatory system: Secondary | ICD-10-CM | POA: Diagnosis not present

## 2020-07-17 DIAGNOSIS — R000 Tachycardia, unspecified: Secondary | ICD-10-CM | POA: Diagnosis not present

## 2020-07-17 DIAGNOSIS — Z7901 Long term (current) use of anticoagulants: Secondary | ICD-10-CM | POA: Insufficient documentation

## 2020-07-17 DIAGNOSIS — Z95810 Presence of automatic (implantable) cardiac defibrillator: Secondary | ICD-10-CM | POA: Diagnosis not present

## 2020-07-17 MED ORDER — AMIODARONE HCL 200 MG PO TABS: 100.0000 mg | ORAL_TABLET | Freq: Every day | ORAL | 1 refills | Status: DC

## 2020-07-17 NOTE — Patient Instructions (Signed)
Continue amiodarone '100mg'$  once a day (1/2 of the '200mg'$  tablet)

## 2020-07-17 NOTE — Progress Notes (Signed)
Primary Care Physician: [name], MD Primary Cardiologist: Dr Johnsie Cancel Primary Electrophysiologist: Dr Rayann Heman [facility]: Dr Haroldine Laws Referring Physician: Dr Rayann Heman   [name] is a 65 y.o. male with a history of persistent atrial fibrillation, tachycardia mediated CM, CKD, HTN, OSA who presents for follow up in the [facility].  The patient was initially diagnosed with atrial fibrillation 2018 after presenting with symptoms of SOB and fatigue. 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  He initially failed cardioversion and was placed on amiodarone.  With rhythm control, his EF has improved from 20% to 50%.  He did well for 2 years with low dose amiodarone.  (amiodarone had been reduced due to sinus bradycardia). Unfortunately, he continued to have episodes of afib. He underwent afib ablation on 03/16/19 with Dr Rayann Heman. Patient is on Eliquis for a CHADS2VASC score of 2. Patient reports that he noted an irregular pulse 05/10/19 and was worried he was back in afib. ECG showed SR with PACs.  On follow up today, patient is s/p DCCV in the ED on 07/11/20. Cardiology was consulted and he was restarted on amiodarone. He reports that he feels much "stronger" today and his SOB has resolved. However, his weight has remained elevated.   Today, he denies symptoms of palpitations, chest pain, PND, presyncope, syncope, bleeding, or neurologic sequela. The patient is tolerating medications without difficulties and is otherwise without complaint today.    Atrial Fibrillation Risk Factors:  he does have symptoms or diagnosis of sleep apnea. he is compliant with CPAP therapy.   he has a BMI of Body mass index is 32.12 kg/m.Marland Kitchen Filed Weights   07/17/20 1506  Weight: 107.4 kg     Family History  Problem Relation Age of Onset   Hypertension Mother      Atrial Fibrillation Management history:  Previous antiarrhythmic drugs: amiodarone Previous cardioversions:  03/2016 Previous ablations: 03/16/19 CHADS2VASC score: 2 Anticoagulation history: Eliquis   Past Medical History:  Diagnosis Date   Asthma, persistent    BPH (benign prostatic hyperplasia) 2014   CHF (congestive heart failure) (HCC)    Combined hyperlipidemia    Dysrhythmia    ATRIAL FIBRILLATION   GERD (gastroesophageal reflux disease)    History of kidney stones    Hypertension    Lymphocytic colitis 07/2011   MICROSCOPIC   Obesity    Persistent atrial fibrillation ([place])    Renal stone 04/2012   Seasonal allergic rhinitis    Sleep apnea    does not use a cpap machine   Past Surgical History:  Procedure Laterality Date   ATRIAL FIBRILLATION ABLATION N/A 03/16/2019   Procedure: ATRIAL FIBRILLATION ABLATION;  Surgeon: Thompson Grayer, MD;  Location: [facility];  Service: Cardiovascular;  Laterality: N/A;   CARDIOVERSION N/A 04/23/2016   Procedure: CARDIOVERSION;  Surgeon: Jolaine Artist, MD;  Location: [facility];  Service: Cardiovascular;  Laterality: N/A;   CYSTOSCOPY WITH RETROGRADE PYELOGRAM, URETEROSCOPY AND STENT PLACEMENT Left 07/29/2017   Procedure: CYSTOSCOPY WITH RETROGRADE PYELOGRAM AND LEFT STENT PLACEMENT;  Surgeon: Franchot Gallo, MD;  Location: [facility];  Service: Urology;  Laterality: Left;   EXTRACORPOREAL SHOCK WAVE LITHOTRIPSY Left 08/08/2017   Procedure: LEFT EXTRACORPOREAL SHOCK WAVE LITHOTRIPSY (ESWL);  Surgeon: Nickie Retort, MD;  Location: [facility];  Service: Urology;  Laterality: Left;   TEE WITHOUT CARDIOVERSION N/A 04/23/2016   Procedure: TRANSESOPHAGEAL ECHOCARDIOGRAM (TEE);  Surgeon: Jolaine Artist, MD;  Location: [facility];  Service: Cardiovascular;  Laterality: N/A;   XI ROBOTIC ASSISTED SIMPLE PROSTATECTOMY N/A 12/25/2018   Procedure: XI ROBOTIC ASSISTED SIMPLE PROSTATECTOMY, REMOVAL OF BLADDER STONES;  Surgeon: Cleon Gustin, MD;  Location: [facility];  Service: Urology;  Laterality: N/A;    Current Outpatient Medications   Medication Sig Dispense Refill   acetaminophen (TYLENOL) 325 MG tablet Take 2 tablets (650 mg total) by mouth every 6 (six) hours as needed for mild pain or headache.     albuterol (PROVENTIL HFA;VENTOLIN HFA) 108 (90 Base) MCG/ACT inhaler Inhale 2 puffs into the lungs every 6 (six) hours as needed for wheezing or shortness of breath.     apixaban (ELIQUIS) 5 MG TABS tablet Take 1 tablet (5 mg total) by mouth 2 (two) times daily. 180 tablet 3   Fluticasone-Salmeterol (ADVAIR) 250-50 MCG/DOSE AEPB Inhale 1 puff into the lungs 2 (two) times daily.      furosemide (LASIX) 40 MG tablet Take one tablet by mouth on Mondays and Fridays     pantoprazole (PROTONIX) 40 MG tablet Take 40 mg by mouth daily.     potassium chloride (KLOR-CON) 10 MEQ tablet Take one tablet by mouth daily     sacubitril-valsartan (ENTRESTO) 97-103 MG Take 1 tablet by mouth 2 (two) times daily. 180 tablet 3   tadalafil (CIALIS) 5 MG tablet Take 5 mg by mouth daily.     amiodarone (PACERONE) 200 MG tablet Take 0.5 tablets (100 mg total) by mouth daily. 30 tablet 1   No current facility-administered medications for this encounter.    Allergies  Allergen Reactions   Ibuprofen Other (See Comments)    Has a-Fib, unable to take   Isosorbide Other (See Comments)    HEADACHES from Imdur   Lisinopril Cough   Digoxin And Related Rash    Social History   Socioeconomic History   Marital status: Married    Spouse name: Not on file   Number of children: Not on file   Years of education: Not on file   Highest education level: Not on file  Occupational History   Occupation: GREENHOUSE MANAGEMENT  Tobacco Use   Smoking status: Never   Smokeless tobacco: Never  Vaping Use   Vaping Use: Never used  Substance and Sexual Activity   Alcohol use: No   Drug use: No   Sexual activity: Not on file  Other Topics Concern   Not on file  Social History Narrative   Lives in [place] with his spouse   He works as a Financial planner Strain: 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  Physical Exam: Vitals:   07/17/20 1506  BP: 138/80  Pulse: 72  Weight: 107.4 kg  Height: 6' (1.829 m)     GEN- The patient is a well appearing obese male, alert and oriented x 3 today.   HEENT-head normocephalic, atraumatic, sclera clear, conjunctiva pink, hearing intact, trachea midline. Lungs- Clear to ausculation bilaterally, normal work of breathing Heart- Regular rate and rhythm, no murmurs, rubs or gallops  GI- soft, NT, ND, + BS Extremities- no clubbing, cyanosis. Trace bilateral edema MS- no significant deformity or atrophy Skin- no rash or lesion  Psych- euthymic mood, full affect Neuro- strength and sensation are intact   Wt Readings from Last 3 Encounters:  07/17/20 107.4 kg  07/11/20 104.8 kg  07/11/20 105.4 kg    EKG today demonstrates  SR, PACs, RBBB Vent. rate 72 BPM PR interval 162 ms QRS duration 154 ms QT/QTcB 480/525 ms  Echo 08/01/18 demonstrated  1. The left ventricle has a visually estimated ejection fraction of 50%.  The cavity size was normal. Left ventricular diastolic Doppler parameters  are consistent with impaired relaxation. Left ventricular diffuse  hypokinesis.   2. The right ventricle has normal systolic function. The cavity was  normal. There is no increase in right ventricular wall thickness.   3. Left atrial size was mildly dilated.   4. Right atrial size was mildly dilated.   5. No evidence of mitral valve stenosis. Trivial mitral regurgitation.   6. The aortic valve is tricuspid. Aortic valve regurgitation is trivial  by color flow Doppler. No stenosis of the aortic valve.   7. The  aortic root is normal in size and structure.   8. The inferior vena cava was dilated in size with >50% respiratory  variability. PA systolic pressure 32 mmHg.   Epic records are reviewed at length today  CHA2DS2-VASc Score = 2 The patient's score is based upon: CHF History: Yes HTN History: Yes Age : < 65 Diabetes History: No Stroke History: No Vascular Disease History: No Gender: Male   ASSESSMENT AND PLAN: 1. Persistent Atrial Fibrillation (ICD10:  I48.19) The patient's CHA2DS2-VASc score is 2, indicating a 2.2% annual risk of stroke.   S/p afib ablation with Dr Rayann Heman 03/16/19. S/p DCCV 07/11/20 We discussed therapeutic options today. Would ideally be able to stop amiodarone given long term side effect profile. Patient agreeable to discussing repeat ablation with Dr Rayann Heman. May require alternate AAD.  Continue Eliquis 5 mg BID Continue amiodarone 100 mg daily for now.  2. Secondary Hypercoagulable State (ICD10:  D68.69) The patient is at significant risk for stroke/thromboembolism based upon his CHA2DS2-VASc Score of 2.  Continue Apixaban (Eliquis).   3. Obstructive sleep apnea In lab titration pending 07/24/20.  4. HTN Stable, no changes today.  5. Tachycardia mediated CM/CHF EF normalized with SR. He does have symptoms of fluid overload while in afib. ? Alleviate HF trial candidate. Patient declined pro-BNP today but is willing to discuss with Dr Rayann Heman.  Patient to take lasix 40 mg daily x 3 days then go back to usual schedule. Take K+ supplement with lasix dose.     Follow up with Dr Rayann Heman.    [facility] [address] [place], [place] [address] [phone] 07/17/2020 4:27 PM

## 2020-07-23 ENCOUNTER — Encounter (HOSPITAL_COMMUNITY): Payer: Self-pay | Admitting: Cardiology

## 2020-07-23 ENCOUNTER — Other Ambulatory Visit (HOSPITAL_COMMUNITY): Payer: Self-pay | Admitting: Internal Medicine

## 2020-07-23 MED ORDER — POTASSIUM CHLORIDE ER 10 MEQ PO TBCR: EXTENDED_RELEASE_TABLET | ORAL | 3 refills | Status: DC

## 2020-07-23 NOTE — Addendum Note (Signed)
Addended by: Kerry Dory on: 07/23/2020 04:31 PM   Modules accepted: Orders

## 2020-07-24 ENCOUNTER — Encounter (HOSPITAL_BASED_OUTPATIENT_CLINIC_OR_DEPARTMENT_OTHER): Payer: 59 | Admitting: Cardiology

## 2020-08-27 ENCOUNTER — Ambulatory Visit (HOSPITAL_BASED_OUTPATIENT_CLINIC_OR_DEPARTMENT_OTHER): Payer: 59 | Admitting: Internal Medicine

## 2020-09-10 ENCOUNTER — Other Ambulatory Visit: Payer: Self-pay

## 2020-09-10 ENCOUNTER — Ambulatory Visit (INDEPENDENT_AMBULATORY_CARE_PROVIDER_SITE_OTHER): Payer: Medicare Other | Admitting: Internal Medicine

## 2020-09-10 VITALS — BP 136/70 | HR 65 | Ht 72.0 in | Wt 240.8 lb

## 2020-09-10 DIAGNOSIS — I10 Essential (primary) hypertension: Secondary | ICD-10-CM | POA: Diagnosis not present

## 2020-09-10 DIAGNOSIS — R001 Bradycardia, unspecified: Secondary | ICD-10-CM

## 2020-09-10 DIAGNOSIS — R000 Tachycardia, unspecified: Secondary | ICD-10-CM | POA: Diagnosis not present

## 2020-09-10 DIAGNOSIS — I4819 Other persistent atrial fibrillation: Secondary | ICD-10-CM | POA: Diagnosis not present

## 2020-09-10 DIAGNOSIS — I43 Cardiomyopathy in diseases classified elsewhere: Secondary | ICD-10-CM

## 2020-09-10 DIAGNOSIS — G4733 Obstructive sleep apnea (adult) (pediatric): Secondary | ICD-10-CM

## 2020-09-10 NOTE — Patient Instructions (Addendum)
Medication Instructions:  Your physician recommends that you continue on your current medications as directed. Please refer to the Current Medication list given to you today.  Labwork: None ordered.  Testing/Procedures: Pro BNP, CMP , TSH   Follow-Up: Your physician wants you to follow-up in: 01/20/21 at 3 pm with Thompson Grayer, MD   Any Other Special Instructions Will Be Listed Below (If Applicable).  If you need a refill on your cardiac medications before your next appointment, please call your pharmacy.

## 2020-09-10 NOTE — Progress Notes (Signed)
PCP: [name], MD Primary Cardiologist: Dr Johnsie Cancel Primary EP: Dr Rayann Heman  [name] is a 65 y.o. male who presents today for routine electrophysiology followup.  Since last being seen in our clinic, the patient reports doing reasonably well.  He had a single episode of afib in June and was cardioverted.  He was restarted on amiodarone.  He has not had further arrhythmias.  He has SOB with moderate activity. Today, he denies symptoms of palpitations, chest pain,   lower extremity edema, dizziness, presyncope, or syncope.  The patient is otherwise without complaint today.   Past Medical History:  Diagnosis Date   Asthma, persistent    BPH (benign prostatic hyperplasia) 2014   CHF (congestive heart failure) (HCC)    Combined hyperlipidemia    Dysrhythmia    ATRIAL FIBRILLATION   GERD (gastroesophageal reflux disease)    History of kidney stones    Hypertension    Lymphocytic colitis 07/2011   MICROSCOPIC   Obesity    Persistent atrial fibrillation ([place])    Renal stone 04/2012   Seasonal allergic rhinitis    Sleep apnea    does not use a cpap machine   Past Surgical History:  Procedure Laterality Date   ATRIAL FIBRILLATION ABLATION N/A 03/16/2019   Procedure: ATRIAL FIBRILLATION ABLATION;  Surgeon: Thompson Grayer, MD;  Location: [facility];  Service: Cardiovascular;  Laterality: N/A;   CARDIOVERSION N/A 04/23/2016   Procedure: CARDIOVERSION;  Surgeon: Jolaine Artist, MD;  Location: [facility] ENDOSCOPY;  Service: Cardiovascular;  Laterality: N/A;   CYSTOSCOPY WITH RETROGRADE PYELOGRAM, URETEROSCOPY AND STENT PLACEMENT Left 07/29/2017   Procedure: CYSTOSCOPY WITH RETROGRADE PYELOGRAM AND LEFT STENT PLACEMENT;  Surgeon: Franchot Gallo, MD;  Location: [facility];  Service: Urology;  Laterality: Left;   EXTRACORPOREAL SHOCK WAVE LITHOTRIPSY Left 08/08/2017   Procedure: LEFT EXTRACORPOREAL SHOCK WAVE LITHOTRIPSY (ESWL);  Surgeon: Nickie Retort, MD;  Location: [facility];   Service: Urology;  Laterality: Left;   TEE WITHOUT CARDIOVERSION N/A 04/23/2016   Procedure: TRANSESOPHAGEAL ECHOCARDIOGRAM (TEE);  Surgeon: Jolaine Artist, MD;  Location: [facility] ENDOSCOPY;  Service: Cardiovascular;  Laterality: N/A;   XI ROBOTIC ASSISTED SIMPLE PROSTATECTOMY N/A 12/25/2018   Procedure: XI ROBOTIC ASSISTED SIMPLE PROSTATECTOMY, REMOVAL OF BLADDER STONES;  Surgeon: Cleon Gustin, MD;  Location: [facility];  Service: Urology;  Laterality: N/A;    ROS- all systems are reviewed and negatives except as per HPI above  Current Outpatient Medications  Medication Sig Dispense Refill   acetaminophen (TYLENOL) 325 MG tablet Take 2 tablets (650 mg total) by mouth every 6 (six) hours as needed for mild pain or headache.     albuterol (PROVENTIL HFA;VENTOLIN HFA) 108 (90 Base) MCG/ACT inhaler Inhale 2 puffs into the lungs every 6 (six) hours as needed for wheezing or shortness of breath.     amiodarone (PACERONE) 200 MG tablet Take 0.5 tablets (100 mg total) by mouth daily. 30 tablet 1   apixaban (ELIQUIS) 5 MG TABS tablet Take 1 tablet (5 mg total) by mouth 2 (two) times daily. 180 tablet 3   Fluticasone-Salmeterol (ADVAIR) 250-50 MCG/DOSE AEPB Inhale 1 puff into the lungs 2 (two) times daily.      furosemide (LASIX) 40 MG tablet Take one tablet by mouth on Mondays and Fridays     pantoprazole (PROTONIX) 40 MG tablet Take 40 mg by mouth daily.     potassium chloride (KLOR-CON) 10 MEQ tablet TAKE 3 TABLETS BY MOUTH IN THE MORNING AND  2 TABLETS IN THE EVENING 150 tablet 3   sacubitril-valsartan (ENTRESTO) 97-103 MG Take 1 tablet by mouth 2 (two) times daily. 180 tablet 3   tadalafil (CIALIS) 5 MG tablet Take 5 mg by mouth daily.     No current facility-administered medications for this visit.    Physical Exam: Vitals:   09/10/20 1513  BP: 136/70  Pulse: 65  SpO2: 97%  Weight: 240 lb 12.8 oz (109.2 kg)  Height: 6' (1.829 m)    GEN- The patient is well appearing, alert and  oriented x 3 today.   Head- normocephalic, atraumatic Eyes-  Sclera clear, conjunctiva pink Ears- hearing intact Oropharynx- clear Lungs- Clear to ausculation bilaterally, normal work of breathing Heart- Regular rate and rhythm, no murmurs, rubs or gallops, PMI not laterally displaced GI- soft, NT, ND, + BS Extremities- no clubbing, cyanosis, or edema  Wt Readings from Last 3 Encounters:  09/10/20 240 lb 12.8 oz (109.2 kg)  07/17/20 236 lb 12.8 oz (107.4 kg)  07/11/20 231 lb (104.8 kg)   Echo 09/21/19- EF 60%, moderate LA enlargement, severe RA enlargement  EKG tracing ordered today is personally reviewed and shows sinus rhythm with PACs, RBBB, Qtc 497 msec  Assessment and Plan:  Persistent atrial fibrillation The patient has symptomatic, recurrent  atrial fibrillation.  He is s/p ablation 2/21 he has had only 1 episode of afib post ablation and was cardioverted 6/22.  I am not convinced that he requires daily AAD therapy at this point. Chads2vasc score is 2.  he is anticoagulated with eliquis . Therapeutic strategies for afib including medicine (continued amiodarone) and ablation were discussed in detail with the patient today.  We also discussed that we could stop amiodarone and follow his AF burden going forward.  If he had further AF, we could consider tikosyn (after amio washout) vs ablation.  Given CRI, our ability to use tikosyn may be limited. For now, he would prefer to continue amiodarone '100mg'$  daily.  We will reassess in 4 months. Given severe RA enlargement and at least moderate LA enlargement, I am not convinced that a cure for Afib will be a reasonable expectation for him.  Fortunately, his AF is currently well controlled. Lfts, tfts are ordered today  2. HTN Stable No change required today Bmet is ordered today  3. Tachycardia mediated CM EF has improved with sinus  4. OSA Compliance with therapy is advised  5. Chronic diastolic dysfunction We discussed  Alleviate HF trial today. Pro bnp is ordered.  If he qualifies for the study, we will reach out to Dr Haroldine Laws to see if he would like for [place] to proceed.  Risks, benefits and potential toxicities for medications prescribed and/or refilled reviewed with patient today.   Follow-up with Dr Haroldine Laws in December as per his last note Return to see me in 4 months.  Thompson Grayer MD, [facility] 09/10/2020 3:21 PM

## 2020-09-11 LAB — TSH: TSH: 0.674 u[IU]/mL (ref 0.450–4.500)

## 2020-09-11 LAB — COMPREHENSIVE METABOLIC PANEL
ALT: 11 IU/L (ref 0–44)
AST: 20 IU/L (ref 0–40)
Albumin/Globulin Ratio: 1.8 (ref 1.2–2.2)
Albumin: 4 g/dL (ref 3.8–4.8)
Alkaline Phosphatase: 88 IU/L (ref 44–121)
BUN/Creatinine Ratio: 12 (ref 10–24)
BUN: 19 mg/dL (ref 8–27)
Bilirubin Total: 0.9 mg/dL (ref 0.0–1.2)
CO2: 23 mmol/L (ref 20–29)
Calcium: 8.9 mg/dL (ref 8.6–10.2)
Chloride: 103 mmol/L (ref 96–106)
Creatinine, Ser: 1.57 mg/dL — ABNORMAL HIGH (ref 0.76–1.27)
Globulin, Total: 2.2 g/dL (ref 1.5–4.5)
Glucose: 96 mg/dL (ref 65–99)
Potassium: 3.3 mmol/L — ABNORMAL LOW (ref 3.5–5.2)
Sodium: 141 mmol/L (ref 134–144)
Total Protein: 6.2 g/dL (ref 6.0–8.5)
eGFR: 49 mL/min/{1.73_m2} — ABNORMAL LOW (ref >59)

## 2020-09-11 LAB — PRO B NATRIURETIC PEPTIDE: NT-Pro BNP: 619 pg/mL — ABNORMAL HIGH (ref 0–376)

## 2020-09-12 ENCOUNTER — Telehealth: Payer: Self-pay | Admitting: Internal Medicine

## 2020-09-12 DIAGNOSIS — E876 Hypokalemia: Secondary | ICD-10-CM

## 2020-09-12 NOTE — Telephone Encounter (Signed)
Patient is returning call to discuss lab results. 

## 2020-09-15 MED ORDER — POTASSIUM CHLORIDE ER 10 MEQ PO TBCR: 20.0000 meq | EXTENDED_RELEASE_TABLET | Freq: Every day | ORAL | 3 refills | Status: DC

## 2020-09-15 NOTE — Telephone Encounter (Signed)
The patient has been notified of the result and verbalized understanding.  All questions (if any) were answered. Darrell Jewel, RN 09/15/2020 8:40 AM    Patient started taking potassium just once a day on Monday and Friday's after Dr. Arman Filter reduced his dose of lasix in Aug to Monday and Friday only. Prescription reads TAKE 3 TABLETS BY MOUTH IN THE MORNING AND 2 TABLETS IN THE EVENING of Klor-con 10 meq. The patient stated that because this was not reduced at the same time as the lasix he reduced it himself. Prior to the changed lasix the patient states he was just taking one potassium daily along with one lasix daily. Advised the patient I would call back with what Dr. Rayann Heman recommends going forward.

## 2020-09-15 NOTE — Telephone Encounter (Signed)
Per MD Allred take potassium 20 meq daily going forward and repeat BMET in 1 wk - 09/22/20. Patient in agreement and verbalized understanding.

## 2020-09-22 ENCOUNTER — Other Ambulatory Visit: Payer: Medicare Other

## 2020-11-04 ENCOUNTER — Other Ambulatory Visit (HOSPITAL_COMMUNITY): Payer: Self-pay | Admitting: Physician Assistant

## 2020-11-18 ENCOUNTER — Other Ambulatory Visit: Payer: Self-pay | Admitting: Internal Medicine

## 2020-11-18 ENCOUNTER — Other Ambulatory Visit: Payer: Medicare Other

## 2020-11-18 ENCOUNTER — Other Ambulatory Visit: Payer: Self-pay

## 2020-11-24 LAB — BASIC METABOLIC PANEL
BUN/Creatinine Ratio: 13 (ref 10–24)
BUN: 20 mg/dL (ref 8–27)
CO2: 21 mmol/L (ref 20–29)
Calcium: 8.9 mg/dL (ref 8.6–10.2)
Chloride: 107 mmol/L — ABNORMAL HIGH (ref 96–106)
Creatinine, Ser: 1.53 mg/dL — ABNORMAL HIGH (ref 0.76–1.27)
Glucose: 114 mg/dL — ABNORMAL HIGH (ref 70–99)
Potassium: 4.3 mmol/L (ref 3.5–5.2)
Sodium: 139 mmol/L (ref 134–144)
eGFR: 50 mL/min/{1.73_m2} — ABNORMAL LOW (ref >59)

## 2020-12-05 ENCOUNTER — Telehealth: Payer: Self-pay | Admitting: Internal Medicine

## 2020-12-05 ENCOUNTER — Encounter: Payer: Self-pay | Admitting: *Deleted

## 2020-12-05 NOTE — Telephone Encounter (Signed)
Printed, signed and placed in medical record for faxing.

## 2020-12-05 NOTE — Telephone Encounter (Signed)
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 Please send to fax number [phone].

## 2020-12-08 ENCOUNTER — Other Ambulatory Visit: Payer: Self-pay | Admitting: *Deleted

## 2020-12-08 MED ORDER — AMIODARONE HCL 100 MG PO TABS: 100.0000 mg | ORAL_TABLET | Freq: Every day | ORAL | 3 refills | Status: DC

## 2021-01-05 ENCOUNTER — Telehealth: Payer: Self-pay

## 2021-01-05 NOTE — Telephone Encounter (Signed)
Call us if you are still interested in completing your sleep study. Your order will expire in 30 days of this letter. If we have not heard from you within this time frame you will need to discuss this further with your provider at your next office visit.  Sincerely,  [facility]

## 2021-01-20 ENCOUNTER — Other Ambulatory Visit: Payer: Self-pay

## 2021-01-20 ENCOUNTER — Encounter: Payer: Self-pay | Admitting: Internal Medicine

## 2021-01-20 ENCOUNTER — Ambulatory Visit (INDEPENDENT_AMBULATORY_CARE_PROVIDER_SITE_OTHER): Payer: Medicare Other | Admitting: Internal Medicine

## 2021-01-20 VITALS — BP 154/76 | HR 63 | Ht 72.0 in | Wt 229.4 lb

## 2021-01-20 DIAGNOSIS — I10 Essential (primary) hypertension: Secondary | ICD-10-CM | POA: Diagnosis not present

## 2021-01-20 DIAGNOSIS — I4819 Other persistent atrial fibrillation: Secondary | ICD-10-CM | POA: Diagnosis not present

## 2021-01-20 DIAGNOSIS — R000 Tachycardia, unspecified: Secondary | ICD-10-CM | POA: Diagnosis not present

## 2021-01-20 DIAGNOSIS — G4733 Obstructive sleep apnea (adult) (pediatric): Secondary | ICD-10-CM

## 2021-01-20 DIAGNOSIS — I5032 Chronic diastolic (congestive) heart failure: Secondary | ICD-10-CM

## 2021-01-20 DIAGNOSIS — I43 Cardiomyopathy in diseases classified elsewhere: Secondary | ICD-10-CM

## 2021-01-20 MED ORDER — SPIRONOLACTONE 25 MG PO TABS: 12.5000 mg | ORAL_TABLET | Freq: Every day | ORAL | 3 refills | Status: DC

## 2021-01-20 NOTE — Patient Instructions (Addendum)
Medication Instructions:  Start Spironolactone 12.5 mg daily Your physician recommends that you continue on your current medications as directed. Please refer to the Current Medication list given to you today. *If you need a refill on your cardiac medications before your next appointment, please call your pharmacy*  Lab Work: Pro BNP, CMET, TSH, CBC If you have labs (blood work) drawn today and your tests are completely normal, you will receive your results only by: [place] (if you have MyChart) OR A paper copy in the mail If you have any lab test that is abnormal or we need to change your treatment, we will call you to review the results.  Testing/Procedures: None.  Follow-Up: At [facility], you and your health needs are our priority.  As part of our continuing mission to provide you with exceptional heart care, we have created designated Provider Care Teams.  These Care Teams include your primary Cardiologist (physician) and Advanced Practice Providers (APPs -  Physician Assistants and Nurse Practitioners) who all work together to provide you with the care you need, when you need it.  Your physician wants you to follow-up in: 6 months in the [facility]. They will contact you to schedule.     You will receive a reminder letter in the mail two months in advance. If you don't receive a letter, please call our office to schedule the follow-up appointment.  We recommend signing up for the patient portal called "MyChart".  Sign up information is provided on this After Visit Summary.  MyChart is used to connect with patients for Virtual Visits (Telemedicine).  Patients are able to view lab/test results, encounter notes, upcoming appointments, etc.  Non-urgent messages can be sent to your provider as well.   To learn more about what you can do with MyChart, go to [email or web address].    Any Other Special Instructions Will Be Listed Below (If Applicable).

## 2021-01-20 NOTE — Progress Notes (Signed)
PCP: [name], MD Primary Cardiologist: Dr Johnsie Cancel Primary EP: Dr Rayann Heman  [name] is a 65 y.o. male who presents today for routine electrophysiology followup.  Since last being seen in our clinic, the patient reports doing very well.  SOB is stable. Today, he denies symptoms of palpitations, chest pain,  lower extremity edema, dizziness, presyncope, or syncope.  The patient is otherwise without complaint today.   Past Medical History:  Diagnosis Date   Asthma, persistent    BPH (benign prostatic hyperplasia) 2014   CHF (congestive heart failure) (HCC)    Combined hyperlipidemia    Dysrhythmia    ATRIAL FIBRILLATION   GERD (gastroesophageal reflux disease)    History of kidney stones    Hypertension    Lymphocytic colitis 07/2011   MICROSCOPIC   Obesity    Persistent atrial fibrillation ([place])    Renal stone 04/2012   Seasonal allergic rhinitis    Sleep apnea    does not use a cpap machine   Past Surgical History:  Procedure Laterality Date   ATRIAL FIBRILLATION ABLATION N/A 03/16/2019   Procedure: ATRIAL FIBRILLATION ABLATION;  Surgeon: Thompson Grayer, MD;  Location: [facility];  Service: Cardiovascular;  Laterality: N/A;   CARDIOVERSION N/A 04/23/2016   Procedure: CARDIOVERSION;  Surgeon: Jolaine Artist, MD;  Location: [facility] ENDOSCOPY;  Service: Cardiovascular;  Laterality: N/A;   CYSTOSCOPY WITH RETROGRADE PYELOGRAM, URETEROSCOPY AND STENT PLACEMENT Left 07/29/2017   Procedure: CYSTOSCOPY WITH RETROGRADE PYELOGRAM AND LEFT STENT PLACEMENT;  Surgeon: Franchot Gallo, MD;  Location: [facility];  Service: Urology;  Laterality: Left;   EXTRACORPOREAL SHOCK WAVE LITHOTRIPSY Left 08/08/2017   Procedure: LEFT EXTRACORPOREAL SHOCK WAVE LITHOTRIPSY (ESWL);  Surgeon: Nickie Retort, MD;  Location: [facility];  Service: Urology;  Laterality: Left;   TEE WITHOUT CARDIOVERSION N/A 04/23/2016   Procedure: TRANSESOPHAGEAL ECHOCARDIOGRAM (TEE);  Surgeon: Jolaine Artist, MD;   Location: [facility] ENDOSCOPY;  Service: Cardiovascular;  Laterality: N/A;   XI ROBOTIC ASSISTED SIMPLE PROSTATECTOMY N/A 12/25/2018   Procedure: XI ROBOTIC ASSISTED SIMPLE PROSTATECTOMY, REMOVAL OF BLADDER STONES;  Surgeon: Cleon Gustin, MD;  Location: [facility];  Service: Urology;  Laterality: N/A;    ROS- all systems are reviewed and negatives except as per HPI above  Current Outpatient Medications  Medication Sig Dispense Refill   acetaminophen (TYLENOL) 325 MG tablet Take 2 tablets (650 mg total) by mouth every 6 (six) hours as needed for mild pain or headache.     albuterol (PROVENTIL HFA;VENTOLIN HFA) 108 (90 Base) MCG/ACT inhaler Inhale 2 puffs into the lungs every 6 (six) hours as needed for wheezing or shortness of breath.     amiodarone (PACERONE) 100 MG tablet Take 1 tablet (100 mg total) by mouth daily. 90 tablet 3   apixaban (ELIQUIS) 5 MG TABS tablet Take 1 tablet (5 mg total) by mouth 2 (two) times daily. 180 tablet 3   Fluticasone-Salmeterol (ADVAIR) 250-50 MCG/DOSE AEPB Inhale 1 puff into the lungs 2 (two) times daily.      furosemide (LASIX) 40 MG tablet Take one tablet by mouth on Mondays and Fridays     pantoprazole (PROTONIX) 40 MG tablet Take 40 mg by mouth daily.     potassium chloride (KLOR-CON) 10 MEQ tablet Take 2 tablets (20 mEq total) by mouth daily. 180 tablet 3   sacubitril-valsartan (ENTRESTO) 97-103 MG Take 1 tablet by mouth 2 (two) times daily. 180 tablet 3   tadalafil (CIALIS) 5 MG tablet Take 5  mg by mouth daily.     No current facility-administered medications for this visit.    Physical Exam: Vitals:   01/20/21 1513  BP: (!) 154/76  Pulse: 63  SpO2: 98%  Weight: 229 lb 6.4 oz (104.1 kg)  Height: 6' (1.829 m)    GEN- The patient is well appearing, alert and oriented x 3 today.   Head- normocephalic, atraumatic Eyes-  Sclera clear, conjunctiva pink Ears- hearing intact Oropharynx- clear Lungs- Clear to ausculation bilaterally, normal work of  breathing Heart- Regular rate and rhythm, no murmurs, rubs or gallops, PMI not laterally displaced GI- soft, NT, ND, + BS Extremities- no clubbing, cyanosis, or edema  Wt Readings from Last 3 Encounters:  01/20/21 229 lb 6.4 oz (104.1 kg)  09/10/20 240 lb 12.8 oz (109.2 kg)  07/17/20 236 lb 12.8 oz (107.4 kg)    EKG tracing ordered today is personally reviewed and shows sinus rhythm with PACs, PVCs, RBBB  Assessment and Plan:  Persistent afib Doing well s/p ablation Chads2vasc score is 2.  He is on eliquis We have previously reduced amiodarone to 100mg  daily He has severe RA enlargement and moderate LA enlargement but has done suprisingly well post ablation. Lfts, tfts are ordered today Cbc and bmet are ordered today Prior labs reviewed  2. HTN Elevated Start spironolactone 12.5mg  daily Bmet today Repeat bmet in 1-2 weeks  3. OSA Compliance with CPAP advised  4. Chronic diastolic dysfunction Stable NYHA Class III CHF No change required today Previously elevated BNP noted We discussed Alleviate HF at length today.  He would be willing to proceed.  I think that long term monitoring of his AF would also be beneficial. I will repeat bmet today and have research team review.  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   Return to AF clinic in 6 months Follow-up with Dr Haroldine Laws as scheduled  Thompson Grayer MD, [facility] 01/20/2021 3:28 PM

## 2021-01-21 LAB — COMPREHENSIVE METABOLIC PANEL
ALT: 9 IU/L (ref 0–44)
AST: 13 IU/L (ref 0–40)
Albumin/Globulin Ratio: 1.5 (ref 1.2–2.2)
Albumin: 4 g/dL (ref 3.8–4.8)
Alkaline Phosphatase: 88 IU/L (ref 44–121)
BUN/Creatinine Ratio: 14 (ref 10–24)
BUN: 25 mg/dL (ref 8–27)
Bilirubin Total: 0.5 mg/dL (ref 0.0–1.2)
CO2: 21 mmol/L (ref 20–29)
Calcium: 9.4 mg/dL (ref 8.6–10.2)
Chloride: 104 mmol/L (ref 96–106)
Creatinine, Ser: 1.73 mg/dL — ABNORMAL HIGH (ref 0.76–1.27)
Globulin, Total: 2.7 g/dL (ref 1.5–4.5)
Glucose: 76 mg/dL (ref 70–99)
Potassium: 4.6 mmol/L (ref 3.5–5.2)
Sodium: 139 mmol/L (ref 134–144)
Total Protein: 6.7 g/dL (ref 6.0–8.5)
eGFR: 43 mL/min/{1.73_m2} — ABNORMAL LOW (ref >59)

## 2021-01-21 LAB — CBC
Hematocrit: 37.5 % (ref 37.5–51.0)
Hemoglobin: 11.8 g/dL — ABNORMAL LOW (ref 13.0–17.7)
MCH: 23.6 pg — ABNORMAL LOW (ref 26.6–33.0)
MCHC: 31.5 g/dL (ref 31.5–35.7)
MCV: 75 fL — ABNORMAL LOW (ref 79–97)
Platelets: 391 10*3/uL (ref 150–450)
RBC: 5.01 x10E6/uL (ref 4.14–5.80)
RDW: 16.7 % — ABNORMAL HIGH (ref 11.6–15.4)
WBC: 6.9 10*3/uL (ref 3.4–10.8)

## 2021-01-21 LAB — TSH: TSH: 0.458 u[IU]/mL (ref 0.450–4.500)

## 2021-01-21 LAB — PRO B NATRIURETIC PEPTIDE: NT-Pro BNP: 205 pg/mL (ref 0–376)

## 2021-02-03 ENCOUNTER — Other Ambulatory Visit: Payer: Medicare HMO | Admitting: *Deleted

## 2021-02-03 ENCOUNTER — Other Ambulatory Visit: Payer: Self-pay

## 2021-02-03 DIAGNOSIS — I428 Other cardiomyopathies: Secondary | ICD-10-CM

## 2021-02-03 DIAGNOSIS — I4819 Other persistent atrial fibrillation: Secondary | ICD-10-CM

## 2021-02-03 DIAGNOSIS — I43 Cardiomyopathy in diseases classified elsewhere: Secondary | ICD-10-CM

## 2021-02-03 DIAGNOSIS — R000 Tachycardia, unspecified: Secondary | ICD-10-CM | POA: Diagnosis not present

## 2021-02-03 DIAGNOSIS — I5032 Chronic diastolic (congestive) heart failure: Secondary | ICD-10-CM

## 2021-02-03 DIAGNOSIS — G4733 Obstructive sleep apnea (adult) (pediatric): Secondary | ICD-10-CM

## 2021-02-03 DIAGNOSIS — I10 Essential (primary) hypertension: Secondary | ICD-10-CM

## 2021-02-04 LAB — BASIC METABOLIC PANEL
BUN/Creatinine Ratio: 13 (ref 10–24)
BUN: 22 mg/dL (ref 8–27)
CO2: 22 mmol/L (ref 20–29)
Calcium: 9.1 mg/dL (ref 8.6–10.2)
Chloride: 105 mmol/L (ref 96–106)
Creatinine, Ser: 1.63 mg/dL — ABNORMAL HIGH (ref 0.76–1.27)
Glucose: 58 mg/dL — ABNORMAL LOW (ref 70–99)
Potassium: 4.6 mmol/L (ref 3.5–5.2)
Sodium: 140 mmol/L (ref 134–144)
eGFR: 46 mL/min/{1.73_m2} — ABNORMAL LOW (ref >59)

## 2021-04-22 DIAGNOSIS — N281 Cyst of kidney, acquired: Secondary | ICD-10-CM | POA: Diagnosis not present

## 2021-04-22 DIAGNOSIS — I5022 Chronic systolic (congestive) heart failure: Secondary | ICD-10-CM | POA: Diagnosis not present

## 2021-04-22 DIAGNOSIS — Z87442 Personal history of urinary calculi: Secondary | ICD-10-CM | POA: Diagnosis not present

## 2021-04-22 DIAGNOSIS — R809 Proteinuria, unspecified: Secondary | ICD-10-CM | POA: Diagnosis not present

## 2021-04-22 DIAGNOSIS — I129 Hypertensive chronic kidney disease with stage 1 through stage 4 chronic kidney disease, or unspecified chronic kidney disease: Secondary | ICD-10-CM | POA: Diagnosis not present

## 2021-04-22 DIAGNOSIS — N1831 Chronic kidney disease, stage 3a: Secondary | ICD-10-CM | POA: Diagnosis not present

## 2021-05-08 ENCOUNTER — Other Ambulatory Visit (HOSPITAL_COMMUNITY): Payer: Self-pay

## 2021-05-08 MED ORDER — ENTRESTO 97-103 MG PO TABS: 1.0000 | ORAL_TABLET | Freq: Two times a day (BID) | ORAL | 0 refills | Status: DC

## 2021-05-18 DIAGNOSIS — R21 Rash and other nonspecific skin eruption: Secondary | ICD-10-CM | POA: Diagnosis not present

## 2021-05-28 ENCOUNTER — Other Ambulatory Visit (HOSPITAL_COMMUNITY): Payer: Self-pay | Admitting: *Deleted

## 2021-05-28 MED ORDER — APIXABAN 5 MG PO TABS: 5.0000 mg | ORAL_TABLET | Freq: Two times a day (BID) | ORAL | 3 refills | Status: DC

## 2021-06-16 ENCOUNTER — Other Ambulatory Visit (HOSPITAL_COMMUNITY): Payer: Self-pay | Admitting: *Deleted

## 2021-06-16 MED ORDER — POTASSIUM CHLORIDE ER 10 MEQ PO TBCR: 20.0000 meq | EXTENDED_RELEASE_TABLET | Freq: Every day | ORAL | 3 refills | Status: DC

## 2021-08-05 ENCOUNTER — Ambulatory Visit (HOSPITAL_COMMUNITY)
Admission: RE | Admit: 2021-08-05 | Discharge: 2021-08-05 | Disposition: A | Discharge status: Home / Self Care | Payer: Medicare HMO | Source: Ambulatory Visit | Attending: Internal Medicine | Admitting: Internal Medicine

## 2021-08-05 ENCOUNTER — Encounter (HOSPITAL_COMMUNITY): Payer: Self-pay | Admitting: Internal Medicine

## 2021-08-05 VITALS — BP 148/92 | HR 56 | Wt 249.8 lb

## 2021-08-05 DIAGNOSIS — N1832 Chronic kidney disease, stage 3b: Secondary | ICD-10-CM | POA: Diagnosis not present

## 2021-08-05 DIAGNOSIS — I5032 Chronic diastolic (congestive) heart failure: Secondary | ICD-10-CM

## 2021-08-05 DIAGNOSIS — I4819 Other persistent atrial fibrillation: Secondary | ICD-10-CM | POA: Insufficient documentation

## 2021-08-05 DIAGNOSIS — Z79899 Other long term (current) drug therapy: Secondary | ICD-10-CM | POA: Diagnosis not present

## 2021-08-05 DIAGNOSIS — I10 Essential (primary) hypertension: Secondary | ICD-10-CM

## 2021-08-05 DIAGNOSIS — I480 Paroxysmal atrial fibrillation: Secondary | ICD-10-CM | POA: Diagnosis not present

## 2021-08-05 DIAGNOSIS — I5022 Chronic systolic (congestive) heart failure: Secondary | ICD-10-CM | POA: Insufficient documentation

## 2021-08-05 DIAGNOSIS — G4733 Obstructive sleep apnea (adult) (pediatric): Secondary | ICD-10-CM | POA: Diagnosis not present

## 2021-08-05 DIAGNOSIS — R001 Bradycardia, unspecified: Secondary | ICD-10-CM | POA: Insufficient documentation

## 2021-08-05 DIAGNOSIS — I130 Hypertensive heart and chronic kidney disease with heart failure and stage 1 through stage 4 chronic kidney disease, or unspecified chronic kidney disease: Secondary | ICD-10-CM | POA: Insufficient documentation

## 2021-08-05 DIAGNOSIS — Z7901 Long term (current) use of anticoagulants: Secondary | ICD-10-CM | POA: Diagnosis not present

## 2021-08-05 DIAGNOSIS — R21 Rash and other nonspecific skin eruption: Secondary | ICD-10-CM | POA: Diagnosis not present

## 2021-08-05 DIAGNOSIS — I428 Other cardiomyopathies: Secondary | ICD-10-CM | POA: Diagnosis not present

## 2021-08-05 DIAGNOSIS — R000 Tachycardia, unspecified: Secondary | ICD-10-CM | POA: Diagnosis not present

## 2021-08-05 MED ORDER — EPLERENONE 25 MG PO TABS: 12.5000 mg | ORAL_TABLET | Freq: Every day | ORAL | 6 refills | Status: DC

## 2021-08-05 NOTE — Progress Notes (Signed)
Advanced Heart Failure Clinic Note    Primary Cardiologist: Dr. Johnsie Cancel HF: Dr Haroldine Laws  PCP: Dr Laurann [place] Urology:  Dr Diona Fanti   HPI: [name] is a 66 year old male with a past medical history of tachy mediated cardiomyopathy, persistent atrial fibrillation (on Eliquis), systolic CHF (EF 95-28%) and CKD 3b.   He was first diagnosed with Afib in 02/2016, seen in the Afib clinic and the ED for this. He underwent 2 DC-CV and only held NSR for a few days. He started Amiodarone.   Admitted 04/16/16-04/27/16 with rapid afib and decompensation. He was started on milrinone for marginal mixed venous sat of 57% and optimization with plans for repeat DCCV after loading with IV Amiodarone. Underwent TEE/DCCV on 04/23/16 with restoration of NSR.He was continued on Eliquis for anticoagulation at discharge, as he did have some evidence of LV thrombus on Echo, however it appeared to have resolved when he had a TEE on 04/23/16. Overall diuresed 9.5L and discharge weight was 217 pounds.   July 2019 he had kidney stones and underwent lithotripsy.   Echo 7/20 EF 50-55%.   In 7/20 was doing very well. EF 50-55%  Maintaining NSR on amio 200 daily. Amio cut back to 100 daily due to sinus brady with HR in 40s. Several weeks later presented for a sick visit with recurrent AF with RVR, We reloaded amio with 400 bid and scheduled DC-CV. Several days later presented for DC-CV and was in NSR.   Underwent PVI with Dr. Rayann Heman 2/21.   Echo 8/21 EF 60-65%  Returns for routine f/u. Remains in SR off amio. Says he has gained 25 pounds over the past year. Saw Dr. Rayann Heman a few months ago and BP was up. Started on spiro. Had to stop due to rash and HA. Feels ok. Working FT as Development worker, international aid. Denies CP or SOB. No palpitations.   Zio in 8/21  Sinus avg HR 56 with nighttime brady in 30s. 5% PVCs. No AF  Echo 3/18 EF 40-45% RV mildly HK  Echo 2/19 EF 40-45% RV mildly reduced ECHO 08/01/2018 EF 50-55%   Review of systems complete  and found to be negative unless listed in HPI.   Past Medical History:  Diagnosis Date   Asthma, persistent    BPH (benign prostatic hyperplasia) 2014   CHF (congestive heart failure) (HCC)    Combined hyperlipidemia    Dysrhythmia    ATRIAL FIBRILLATION   GERD (gastroesophageal reflux disease)    History of kidney stones    Hypertension    Lymphocytic colitis 07/2011   MICROSCOPIC   Obesity    Persistent atrial fibrillation ([place])    Renal stone 04/2012   Seasonal allergic rhinitis    Sleep apnea    does not use a cpap machine    Current Outpatient Medications  Medication Sig Dispense Refill   acetaminophen (TYLENOL) 325 MG tablet Take 2 tablets (650 mg total) by mouth every 6 (six) hours as needed for mild pain or headache.     albuterol (PROVENTIL HFA;VENTOLIN HFA) 108 (90 Base) MCG/ACT inhaler Inhale 2 puffs into the lungs every 6 (six) hours as needed for wheezing or shortness of breath.     amiodarone (PACERONE) 100 MG tablet Take 1 tablet (100 mg total) by mouth daily. 90 tablet 3   apixaban (ELIQUIS) 5 MG TABS tablet Take 1 tablet (5 mg total) by mouth 2 (two) times daily. 180 tablet 3   Fluticasone-Salmeterol (ADVAIR) 250-50 MCG/DOSE AEPB Inhale 1 puff into  the lungs 2 (two) times daily.      furosemide (LASIX) 40 MG tablet Take one tablet by mouth on Mondays and Fridays     pantoprazole (PROTONIX) 40 MG tablet Take 40 mg by mouth daily.     potassium chloride (KLOR-CON) 10 MEQ tablet Take 2 tablets (20 mEq total) by mouth daily. 180 tablet 3   sacubitril-valsartan (ENTRESTO) 97-103 MG Take 1 tablet by mouth 2 (two) times daily. 180 tablet 0   tadalafil (CIALIS) 5 MG tablet Take 5 mg by mouth daily.     No current facility-administered medications for this encounter.    Allergies  Allergen Reactions   Ibuprofen Other (See Comments)    Has a-Fib, unable to take   Isosorbide Other (See Comments)    HEADACHES from Imdur   Lisinopril Cough   Digoxin And Related Rash    Spironolactone Rash      Social History   Socioeconomic History   Marital status: Married    Spouse name: Not on file   Number of children: Not on file   Years of education: Not on file   Highest education level: Not on file  Occupational History   Occupation: GREENHOUSE MANAGEMENT  Tobacco Use   Smoking status: Never   Smokeless tobacco: Never  Vaping Use   Vaping Use: Never used  Substance and Sexual Activity   Alcohol use: No   Drug use: No   Sexual activity: Not on file  Other Topics Concern   Not on file  Social History Narrative   Lives in [place] with his spouse   He works as a Research scientist (life sciences) Strain: Not on Art therapist Insecurity: Not on file  Transportation Needs: Not on file  Physical Activity: Not on file  Stress: Not on file  Social Connections: Not on file  Intimate Partner Violence: Not on file      Family History  Problem Relation Age of Onset   Hypertension Mother     Vitals:   08/05/21 1526  BP: (!) 148/92  Pulse: (!) 56  SpO2: 94%  Weight: 113.3 kg (249 lb 12.8 oz)   Wt Readings from Last 3 Encounters:  08/05/21 113.3 kg (249 lb 12.8 oz)  01/20/21 104.1 kg (229 lb 6.4 oz)  09/10/20 109.2 kg (240 lb 12.8 oz)     PHYSICAL EXAM: General:  Well appearing. No resp difficulty HEENT: normal Neck: supple. no JVD. Carotids 2+ bilat; no bruits. No lymphadenopathy or thryomegaly appreciated. Cor: PMI nondisplaced. Regular rate & rhythm. No rubs, gallops or murmurs. Lungs: clear Abdomen: soft, nontender, nondistended. No hepatosplenomegaly. No bruits or masses. Good bowel sounds. Extremities: no cyanosis, clubbing, rash, edema Neuro: alert & orientedx3, cranial nerves grossly intact. moves all 4 extremities w/o difficulty. Affect pleasant  EKG: Sinus brady 56 RBBB Personally reviewed   ASSESSMENT & PLAN: 1. Chronic systolic CHF: EF 08-65%.  NICM, felt to be tachy mediated.  - Echo  06/2016 LVEF 40%. - Echo 02/2017: EF 40-45% - ECHO 7/20 EF 50-55%.   - Echo 8/21 EF 60-65% - Doing great. NYHA I  - Volume status ok off lasix - Continue Entresto to 97/103 BID. - Failed spiro d/t rash and HA. BP now up. Will add eplerenone 12.5. Stop kcl - No BB with bradycardia. - We had previously discussed proceeding with coronary angio to exclude CAD but as he is doing well and EF has recovered, I do not  see a reason to proceed particularly with CKD  2. Paroxsymal atrial fibrillation and frequent PVCs - s/p PVI 2/21 (Dr. Rayann Heman). Amio stopped - Zio in 8/21  Sinus avg HR 56 with nighttime brady in 30s. 5% PVCs. No AF -> amio restarted by Dr. Rayann Heman - Continue Eliquis. No bleeding issues  - Remains on amio 100 daily. Follows with Dr. Rayann Heman  3. Hypertension - Blood pressure is high likely due to weight gain - Plan as above. Will add eplerenone 12.5. Stop kcl. Check labs in 2 weeks  - Suggested weight loss   4. History of LV thrombus - Resolved. No change.   5. Chronic kidney disease stage IIIb:  - baseline creatinine 1.5-2.0.  - stable at 1.6 in 1/23 - follows with Dr. Royce Macadamia - Adding MRA as above. Consider SGLT2i  6. OSA - Sleep study 4/18 with mild OSA. AHI 13.8/hr.  - Has never gotten CPAP - > will need to revisit.   Juan Bickers, MD  3:54 PM  08/05/21

## 2021-08-05 NOTE — Patient Instructions (Signed)
Medication Changes:  STOP Potassium   START Eplerenone 12.5 mg (1/2 tab) Daily  Lab Work:  Your physician recommends that you return for lab work in: 1 month   Testing/Procedures:  Your physician has requested that you have an echocardiogram. Echocardiography is a painless test that uses sound waves to create images of your heart. It provides your doctor with information about the size and shape of your heart and how well your heart's chambers and valves are working. This procedure takes approximately one hour. There are no restrictions for this procedure.  Referrals:  none  Special Instructions // Education:  Do the following things EVERYDAY: Weigh yourself in the morning before breakfast. Write it down and keep it in a log. Take your medicines as prescribed Eat low salt foods--Limit salt (sodium) to 2000 mg per day.  Stay as active as you can everyday Limit all fluids for the day to less than 2 liters   Follow-Up in: 6 months, **PLEASE CALL OUR OFFICE IN NOVEMBER TO SCHEDULE THIS APPOINTMENT  At the [facility], you and your health needs are our priority. We have a designated team specialized in the treatment of Heart Failure. This Care Team includes your primary Heart Failure Specialized Cardiologist (physician), Advanced Practice Providers (APPs- Physician Assistants and Nurse Practitioners), and Pharmacist who all work together to provide you with the care you need, when you need it.   You may see any of the following providers on your designated Care Team at your next follow up:  Dr Glori Bickers Dr Haynes Kerns, NP Lyda Jester, [place] [facility] [place], [place] Audry Riles, PharmD   Please be sure to bring in all your medications bottles to every appointment.   Need to Contact us:  If you have any questions or concerns before your next appointment please send [place] a message through [place] or call our office at  [phone].    TO LEAVE A MESSAGE FOR THE NURSE SELECT OPTION 2, PLEASE LEAVE A MESSAGE INCLUDING: YOUR NAME DATE OF BIRTH CALL BACK NUMBER REASON FOR CALL**this is important as we prioritize the call backs  YOU WILL RECEIVE A CALL BACK THE SAME DAY AS LONG AS YOU CALL BEFORE 4:00 PM

## 2021-08-24 ENCOUNTER — Other Ambulatory Visit (HOSPITAL_COMMUNITY): Payer: Self-pay | Admitting: Internal Medicine

## 2021-09-04 ENCOUNTER — Ambulatory Visit (HOSPITAL_COMMUNITY)
Admission: RE | Admit: 2021-09-04 | Discharge: 2021-09-04 | Disposition: A | Discharge status: Home / Self Care | Payer: Medicare HMO | Source: Ambulatory Visit | Attending: Internal Medicine | Admitting: Internal Medicine

## 2021-09-04 ENCOUNTER — Ambulatory Visit (HOSPITAL_COMMUNITY)
Admission: RE | Admit: 2021-09-04 | Discharge: 2021-09-04 | Disposition: A | Discharge status: Home / Self Care | Payer: Medicare HMO | Source: Ambulatory Visit | Attending: Cardiology | Admitting: Cardiology

## 2021-09-04 DIAGNOSIS — E785 Hyperlipidemia, unspecified: Secondary | ICD-10-CM | POA: Insufficient documentation

## 2021-09-04 DIAGNOSIS — I340 Nonrheumatic mitral (valve) insufficiency: Secondary | ICD-10-CM | POA: Insufficient documentation

## 2021-09-04 DIAGNOSIS — I5032 Chronic diastolic (congestive) heart failure: Secondary | ICD-10-CM | POA: Diagnosis not present

## 2021-09-04 DIAGNOSIS — I4891 Unspecified atrial fibrillation: Secondary | ICD-10-CM | POA: Insufficient documentation

## 2021-09-04 DIAGNOSIS — I110 Hypertensive heart disease with heart failure: Secondary | ICD-10-CM | POA: Insufficient documentation

## 2021-09-04 DIAGNOSIS — G4730 Sleep apnea, unspecified: Secondary | ICD-10-CM | POA: Insufficient documentation

## 2021-09-04 LAB — ECHOCARDIOGRAM COMPLETE
Area-P 1/2: 2.12 cm2
Calc EF: 43.9 %
S' Lateral: 5 cm
Single Plane A2C EF: 50.3 %
Single Plane A4C EF: 42.5 %

## 2021-09-04 LAB — BASIC METABOLIC PANEL
Anion gap: 6 (ref 5–15)
BUN: 19 mg/dL (ref 8–23)
CO2: 25 mmol/L (ref 22–32)
Calcium: 8.7 mg/dL — ABNORMAL LOW (ref 8.9–10.3)
Chloride: 108 mmol/L (ref 98–111)
Creatinine, Ser: 1.8 mg/dL — ABNORMAL HIGH (ref 0.61–1.24)
GFR, Estimated: 41 mL/min — ABNORMAL LOW (ref >60)
Glucose, Bld: 78 mg/dL (ref 70–99)
Potassium: 3.5 mmol/L (ref 3.5–5.1)
Sodium: 139 mmol/L (ref 135–145)

## 2021-09-27 ENCOUNTER — Other Ambulatory Visit: Payer: Self-pay | Admitting: Internal Medicine

## 2021-09-30 DIAGNOSIS — E782 Mixed hyperlipidemia: Secondary | ICD-10-CM | POA: Diagnosis not present

## 2021-09-30 DIAGNOSIS — I480 Paroxysmal atrial fibrillation: Secondary | ICD-10-CM | POA: Diagnosis not present

## 2021-09-30 DIAGNOSIS — N1832 Chronic kidney disease, stage 3b: Secondary | ICD-10-CM | POA: Diagnosis not present

## 2021-09-30 DIAGNOSIS — Z23 Encounter for immunization: Secondary | ICD-10-CM | POA: Diagnosis not present

## 2021-09-30 DIAGNOSIS — Z125 Encounter for screening for malignant neoplasm of prostate: Secondary | ICD-10-CM | POA: Diagnosis not present

## 2021-09-30 DIAGNOSIS — I129 Hypertensive chronic kidney disease with stage 1 through stage 4 chronic kidney disease, or unspecified chronic kidney disease: Secondary | ICD-10-CM | POA: Diagnosis not present

## 2021-09-30 DIAGNOSIS — Z0000 Encounter for general adult medical examination without abnormal findings: Secondary | ICD-10-CM | POA: Diagnosis not present

## 2021-09-30 DIAGNOSIS — K219 Gastro-esophageal reflux disease without esophagitis: Secondary | ICD-10-CM | POA: Diagnosis not present

## 2021-09-30 DIAGNOSIS — I5020 Unspecified systolic (congestive) heart failure: Secondary | ICD-10-CM | POA: Diagnosis not present

## 2021-09-30 DIAGNOSIS — J4530 Mild persistent asthma, uncomplicated: Secondary | ICD-10-CM | POA: Diagnosis not present

## 2021-09-30 DIAGNOSIS — K5289 Other specified noninfective gastroenteritis and colitis: Secondary | ICD-10-CM | POA: Diagnosis not present

## 2021-09-30 DIAGNOSIS — Z1331 Encounter for screening for depression: Secondary | ICD-10-CM | POA: Diagnosis not present

## 2021-11-03 ENCOUNTER — Other Ambulatory Visit (HOSPITAL_COMMUNITY): Payer: Self-pay | Admitting: Internal Medicine

## 2021-11-09 ENCOUNTER — Telehealth (HOSPITAL_COMMUNITY): Payer: Self-pay | Admitting: *Deleted

## 2021-11-09 NOTE — Telephone Encounter (Signed)
Pt called to inform Dr.Bensimhon his bp has still been running 160's/90's. Pt asked if he needed to make any medication changes.

## 2021-11-11 MED ORDER — AMLODIPINE BESYLATE 5 MG PO TABS: 5.0000 mg | ORAL_TABLET | Freq: Every day | ORAL | 3 refills | Status: DC

## 2021-11-11 NOTE — Telephone Encounter (Signed)
Pt aware and agreeable with plan.  

## 2021-12-11 ENCOUNTER — Other Ambulatory Visit (HOSPITAL_COMMUNITY): Payer: Self-pay | Admitting: Internal Medicine

## 2021-12-29 ENCOUNTER — Encounter (HOSPITAL_COMMUNITY): Payer: Self-pay | Admitting: Nurse Practitioner

## 2021-12-29 ENCOUNTER — Ambulatory Visit (HOSPITAL_COMMUNITY)
Admission: RE | Admit: 2021-12-29 | Discharge: 2021-12-29 | Disposition: A | Discharge status: Home / Self Care | Payer: Medicare HMO | Source: Ambulatory Visit | Attending: Nurse Practitioner | Admitting: Nurse Practitioner

## 2021-12-29 VITALS — BP 134/90 | HR 102 | Ht 72.0 in | Wt 253.6 lb

## 2021-12-29 DIAGNOSIS — N189 Chronic kidney disease, unspecified: Secondary | ICD-10-CM | POA: Diagnosis not present

## 2021-12-29 DIAGNOSIS — I130 Hypertensive heart and chronic kidney disease with heart failure and stage 1 through stage 4 chronic kidney disease, or unspecified chronic kidney disease: Secondary | ICD-10-CM | POA: Diagnosis not present

## 2021-12-29 DIAGNOSIS — I4819 Other persistent atrial fibrillation: Secondary | ICD-10-CM | POA: Insufficient documentation

## 2021-12-29 DIAGNOSIS — G4733 Obstructive sleep apnea (adult) (pediatric): Secondary | ICD-10-CM | POA: Diagnosis not present

## 2021-12-29 DIAGNOSIS — D6869 Other thrombophilia: Secondary | ICD-10-CM | POA: Insufficient documentation

## 2021-12-29 DIAGNOSIS — R000 Tachycardia, unspecified: Secondary | ICD-10-CM | POA: Diagnosis not present

## 2021-12-29 DIAGNOSIS — I509 Heart failure, unspecified: Secondary | ICD-10-CM | POA: Diagnosis not present

## 2021-12-29 NOTE — Patient Instructions (Signed)
Continue amiodarone 200mg  once a day   Cardioversion scheduled for: Tuesday, December 19th  Come to afib clinic for labs at [place] at the [facility] and go to admitting at 1030   - Do not eat or drink anything after midnight the night prior to your procedure.   - Take all your morning medication (except diabetic medications) with a sip of water prior to arrival. - You will not be able to drive home after your procedure.    - Do NOT miss any doses of your blood thinner - if you should miss a dose please notify our office immediately.   - If you feel as if you go back into normal rhythm prior to scheduled cardioversion, please notify our office immediately.  If your procedure is canceled in the cardioversion suite you will be charged a cancellation fee.   If you are on weekly OZEMPIC, TRULICITY, MOUNJARO, OR BYDUREON  Hold medication 7 days prior to scheduled procedure/anesthesia.  Restart medication on the normal dosing day after scheduled procedure/anesthesia  If you are on daily BYETTA, WEGOVY, VICTOZA, ADLYXIN, OR RYBELSUS:   Hold medication 24 hours prior to scheduled procedure/anesthesia.   Restart medication on the following day after scheduled procedure/anesthesia   For those patients who have a scheduled procedure/anesthesia on the same day of the week as their dose, hold the medication on the day of surgery.  They can take their scheduled dose the week before.  **Patients on the above medications scheduled for elective procedures that have not held the medication for the appropriate amount of time are at risk of cancellation or change in the anesthetic plan.

## 2021-12-29 NOTE — Progress Notes (Signed)
Primary Care Physician: [name], MD Primary Cardiologist: Dr Johnsie Cancel Primary Electrophysiologist: Dr Rayann Heman [facility]: Dr Haroldine Laws Referring Physician: Dr Rayann Heman   [name] is a 66 y.o. male with a history of persistent atrial fibrillation, tachycardia mediated CM, CKD, HTN, OSA who presents for follow up in the [facility] as he has been in afib x one week. On his own, he has been taking 400 mg bid of amiodrone x one week, but so far as not converted him.   The patient was initially diagnosed with atrial fibrillation 2018 after presenting with symptoms of SOB and fatigue. 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  He initially failed cardioversion and was placed on amiodarone.  With rhythm control, his EF has improved from 20% to 50%.  He did well for 2 years with low dose amiodarone.  (amiodarone had been reduced due to sinus bradycardia). Unfortunately, he continued to have episodes of afib. He underwent afib ablation on 03/16/19 with Dr Rayann Heman. Patient is on Eliquis for a CHADS2VASC score of 2 and normally on amiodarone 100 mg daily. No known trigger.   We discussed pursuing a cardioversion and pt is in agreement. He has noted fatigue in afib. He has been compliant with anticoagulation.   Today, he denies symptoms of palpitations, chest pain, PND, presyncope, syncope, bleeding, or neurologic sequela. The patient is tolerating medications without difficulties and is otherwise without complaint today.    Atrial Fibrillation Risk Factors:  he does have symptoms or diagnosis of sleep apnea. he is compliant with CPAP therapy.   he has a BMI of Body mass index is 32.12 kg/m.Marland Kitchen Filed Weights   07/17/20 1506  Weight: 107.4 kg     Family History  Problem Relation Age of Onset   Hypertension Mother      Atrial Fibrillation Management history:  Previous antiarrhythmic drugs: amiodarone Previous cardioversions: 03/2016 Previous ablations:  03/16/19 CHADS2VASC score: 2 Anticoagulation history: Eliquis   Past Medical History:  Diagnosis Date   Asthma, persistent    BPH (benign prostatic hyperplasia) 2014   CHF (congestive heart failure) (HCC)    Combined hyperlipidemia    Dysrhythmia    ATRIAL FIBRILLATION   GERD (gastroesophageal reflux disease)    History of kidney stones    Hypertension    Lymphocytic colitis 07/2011   MICROSCOPIC   Obesity    Persistent atrial fibrillation ([place])    Renal stone 04/2012   Seasonal allergic rhinitis    Sleep apnea    does not use a cpap machine   Past Surgical History:  Procedure Laterality Date   ATRIAL FIBRILLATION ABLATION N/A 03/16/2019   Procedure: ATRIAL FIBRILLATION ABLATION;  Surgeon: Thompson Grayer, MD;  Location: [place] [facility];  Service: Cardiovascular;  Laterality: N/A;   CARDIOVERSION N/A 04/23/2016   Procedure: CARDIOVERSION;  Surgeon: Jolaine Artist, MD;  Location: [facility] ENDOSCOPY;  Service: Cardiovascular;  Laterality: N/A;   CYSTOSCOPY WITH RETROGRADE PYELOGRAM, URETEROSCOPY AND STENT PLACEMENT Left 07/29/2017   Procedure: CYSTOSCOPY WITH RETROGRADE PYELOGRAM AND LEFT STENT PLACEMENT;  Surgeon: Franchot Gallo, MD;  Location: [facility];  Service: Urology;  Laterality: Left;   EXTRACORPOREAL SHOCK WAVE LITHOTRIPSY Left 08/08/2017   Procedure: LEFT EXTRACORPOREAL SHOCK WAVE LITHOTRIPSY (ESWL);  Surgeon: Nickie Retort, MD;  Location: [facility];  Service: Urology;  Laterality: Left;   TEE WITHOUT CARDIOVERSION N/A 04/23/2016   Procedure: TRANSESOPHAGEAL ECHOCARDIOGRAM (TEE);  Surgeon: Jolaine Artist, MD;  Location: [facility] ENDOSCOPY;  Service:  Cardiovascular;  Laterality: N/A;   XI ROBOTIC ASSISTED SIMPLE PROSTATECTOMY N/A 12/25/2018   Procedure: XI ROBOTIC ASSISTED SIMPLE PROSTATECTOMY, REMOVAL OF BLADDER STONES;  Surgeon: Cleon Gustin, MD;  Location: [facility];  Service: Urology;  Laterality: N/A;    Current Outpatient Medications  Medication Sig Dispense  Refill   acetaminophen (TYLENOL) 325 MG tablet Take 2 tablets (650 mg total) by mouth every 6 (six) hours as needed for mild pain or headache.     albuterol (PROVENTIL HFA;VENTOLIN HFA) 108 (90 Base) MCG/ACT inhaler Inhale 2 puffs into the lungs every 6 (six) hours as needed for wheezing or shortness of breath.     apixaban (ELIQUIS) 5 MG TABS tablet Take 1 tablet (5 mg total) by mouth 2 (two) times daily. 180 tablet 3   Fluticasone-Salmeterol (ADVAIR) 250-50 MCG/DOSE AEPB Inhale 1 puff into the lungs 2 (two) times daily.      furosemide (LASIX) 40 MG tablet Take one tablet by mouth on Mondays and Fridays     pantoprazole (PROTONIX) 40 MG tablet Take 40 mg by mouth daily.     potassium chloride (KLOR-CON) 10 MEQ tablet Take one tablet by mouth daily     sacubitril-valsartan (ENTRESTO) 97-103 MG Take 1 tablet by mouth 2 (two) times daily. 180 tablet 3   tadalafil (CIALIS) 5 MG tablet Take 5 mg by mouth daily.     amiodarone (PACERONE) 200 MG tablet Take 0.5 tablets (100 mg total) by mouth daily. 30 tablet 1   No current facility-administered medications for this encounter.    Allergies  Allergen Reactions   Ibuprofen Other (See Comments)    Has a-Fib, unable to take   Isosorbide Other (See Comments)    HEADACHES from Imdur   Lisinopril Cough   Digoxin And Related Rash    Social History   Socioeconomic History   Marital status: Married    Spouse name: Not on file   Number of children: Not on file   Years of education: Not on file   Highest education level: Not on file  Occupational History   Occupation: GREENHOUSE MANAGEMENT  Tobacco Use   Smoking status: Never   Smokeless tobacco: Never  Vaping Use   Vaping Use: Never used  Substance and Sexual Activity   Alcohol use: No   Drug use: No   Sexual activity: Not on file  Other Topics Concern   Not on file  Social History Narrative   Lives in [place] with his spouse   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  Physical Exam: Vitals:   07/17/20 1506  BP: 138/80  Pulse: 72  Weight: 107.4 kg  Height: 6' (1.829 m)     GEN- The patient is a well appearing obese male, alert and oriented x 3 today.   HEENT-head normocephalic, atraumatic, sclera clear, conjunctiva pink, hearing intact, trachea midline. Lungs- Clear to ausculation bilaterally, normal work of breathing Heart- irregular rate and rhythm, no murmurs, rubs or gallops  GI- soft, NT, ND, + BS Extremities- no clubbing, cyanosis. Trace bilateral edema MS- no significant deformity or atrophy Skin- no rash  or lesion Psych- euthymic mood, full affect Neuro- strength and sensation are intact   Wt Readings from Last 3 Encounters:  07/17/20 107.4 kg  07/11/20 104.8 kg  07/11/20 105.4 kg    EKG today demonstrates  Vent. rate 102 BPM PR interval * ms QRS duration 162 ms QT/QTcB 422/550 ms P-R-T axes * 87 -30 Atrial fibrillation with rapid ventricular response Right bundle branch block T wave abnormality, consider inferior ischemia Abnormal ECG When compared with ECG of 05-Aug-2021 15:36, PREVIOUS ECG IS PRESENT  Echo 08/01/18 demonstrated  1. The left ventricle has a visually estimated ejection fraction of 50%.  The cavity size was normal. Left ventricular diastolic Doppler parameters  are consistent with impaired relaxation. Left ventricular diffuse  hypokinesis.   2. The right ventricle has normal systolic function. The cavity was  normal. There is no increase in right ventricular wall thickness.   3. Left atrial size was mildly dilated.   4. Right atrial size was mildly dilated.   5. No evidence of mitral  valve stenosis. Trivial mitral regurgitation.   6. The aortic valve is tricuspid. Aortic valve regurgitation is trivial  by color flow Doppler. No stenosis of the aortic valve.   7. The aortic root is normal in size and structure.   8. The inferior vena cava was dilated in size with >50% respiratory  variability. PA systolic pressure 32 mmHg.   Epic records are reviewed at length today  CHA2DS2-VASc Score = 2 The patient's score is based upon: CHF History: Yes HTN History: Yes Age : < 65 Diabetes History: No Stroke History: No Vascular Disease History: No Gender: Male   ASSESSMENT AND PLAN: 1. Persistent Atrial Fibrillation (ICD10:  I48.19) The patient's CHA2DS2-VASc score is 2, indicating a 2.2% annual risk of stroke.   S/p afib ablation with Dr Rayann Heman 03/16/19. S/p DCCV 07/11/20 We discussed therapeutic options today. We will pursue cardioversion He will now take 200 mg amiodarone daily  as he self reloaded on amiodarone 400 mg bid for the last week Continue Eliquis 5 mg BID, states no missed doses for the last 3 weeks    2. Secondary Hypercoagulable State (ICD10:  D68.69) The patient is at significant risk for stroke/thromboembolism based upon his CHA2DS2-VASc Score of 2.  Continue Apixaban (Eliquis).  States no missed doses for the last 3 weeks  3. Obstructive sleep apnea Sleep study 05/12/21 with mild OSA Has never gotten CPAP  4. HTN Stable, no changes today.  5. Tachycardia mediated CM/CHF Normalized with SR  I will see back after cardioversion but will refer to EP to reestablish with Dr. Jackalyn Lombard absence to discuss a possible repeat ablation   Butch Penny C. Tremell Reimers, [facility] [address] [place], [place] [address] [phone]

## 2021-12-29 NOTE — H&P (View-Only) (Signed)
Primary Care Physician: [name], MD Primary Cardiologist: Dr Johnsie Cancel Primary Electrophysiologist: Dr Rayann Heman [facility]: Dr Haroldine Laws Referring Physician: Dr Rayann Heman   BRYCESON GRAPE is a 66 y.o. male with a history of persistent atrial fibrillation, tachycardia mediated CM, CKD, HTN, OSA who presents for follow up in the [facility] as he has been in afib x one week. On his own, he has been taking 400 mg bid of amiodrone x one week, but so far as not converted him.   The patient was initially diagnosed with atrial fibrillation 2018 after presenting with symptoms of SOB and fatigue. 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  He initially failed cardioversion and was placed on amiodarone.  With rhythm control, his EF has improved from 20% to 50%.  He did well for 2 years with low dose amiodarone.  (amiodarone had been reduced due to sinus bradycardia). Unfortunately, he continued to have episodes of afib. He underwent afib ablation on 03/16/19 with Dr Rayann Heman. Patient is on Eliquis for a CHADS2VASC score of 2 and normally on amiodarone 100 mg daily. No known trigger.   We discussed pursuing a cardioversion and pt is in agreement. He has noted fatigue in afib. He has been compliant with anticoagulation.   Today, he denies symptoms of palpitations, chest pain, PND, presyncope, syncope, bleeding, or neurologic sequela. The patient is tolerating medications without difficulties and is otherwise without complaint today.    Atrial Fibrillation Risk Factors:  he does have symptoms or diagnosis of sleep apnea. he is compliant with CPAP therapy.   he has a BMI of Body mass index is 32.12 kg/m.Marland Kitchen Filed Weights   07/17/20 1506  Weight: 107.4 kg     Family History  Problem Relation Age of Onset   Hypertension Mother      Atrial Fibrillation Management history:  Previous antiarrhythmic drugs: amiodarone Previous cardioversions: 03/2016 Previous ablations:  03/16/19 CHADS2VASC score: 2 Anticoagulation history: Eliquis   Past Medical History:  Diagnosis Date   Asthma, persistent    BPH (benign prostatic hyperplasia) 2014   CHF (congestive heart failure) (HCC)    Combined hyperlipidemia    Dysrhythmia    ATRIAL FIBRILLATION   GERD (gastroesophageal reflux disease)    History of kidney stones    Hypertension    Lymphocytic colitis 07/2011   MICROSCOPIC   Obesity    Persistent atrial fibrillation ([place])    Renal stone 04/2012   Seasonal allergic rhinitis    Sleep apnea    does not use a cpap machine   Past Surgical History:  Procedure Laterality Date   ATRIAL FIBRILLATION ABLATION N/A 03/16/2019   Procedure: ATRIAL FIBRILLATION ABLATION;  Surgeon: Thompson Grayer, MD;  Location: [place] [facility];  Service: Cardiovascular;  Laterality: N/A;   CARDIOVERSION N/A 04/23/2016   Procedure: CARDIOVERSION;  Surgeon: Jolaine Artist, MD;  Location: [facility] ENDOSCOPY;  Service: Cardiovascular;  Laterality: N/A;   CYSTOSCOPY WITH RETROGRADE PYELOGRAM, URETEROSCOPY AND STENT PLACEMENT Left 07/29/2017   Procedure: CYSTOSCOPY WITH RETROGRADE PYELOGRAM AND LEFT STENT PLACEMENT;  Surgeon: Franchot Gallo, MD;  [facility]: [facility];  Service: Urology;  Laterality: Left;   EXTRACORPOREAL SHOCK WAVE LITHOTRIPSY Left 08/08/2017   Procedure: LEFT EXTRACORPOREAL SHOCK WAVE LITHOTRIPSY (ESWL);  Surgeon: Nickie Retort, MD;  Location: [facility];  Service: Urology;  Laterality: Left;   TEE WITHOUT CARDIOVERSION N/A 04/23/2016   Procedure: TRANSESOPHAGEAL ECHOCARDIOGRAM (TEE);  Surgeon: Jolaine Artist, MD;  Location: [facility] ENDOSCOPY;  Service:  Cardiovascular;  Laterality: N/A;   XI ROBOTIC ASSISTED SIMPLE PROSTATECTOMY N/A 12/25/2018   Procedure: XI ROBOTIC ASSISTED SIMPLE PROSTATECTOMY, REMOVAL OF BLADDER STONES;  Surgeon: Cleon Gustin, MD;  Location: [facility];  Service: Urology;  Laterality: N/A;    Current Outpatient Medications  Medication Sig Dispense  Refill   acetaminophen (TYLENOL) 325 MG tablet Take 2 tablets (650 mg total) by mouth every 6 (six) hours as needed for mild pain or headache.     albuterol (PROVENTIL HFA;VENTOLIN HFA) 108 (90 Base) MCG/ACT inhaler Inhale 2 puffs into the lungs every 6 (six) hours as needed for wheezing or shortness of breath.     apixaban (ELIQUIS) 5 MG TABS tablet Take 1 tablet (5 mg total) by mouth 2 (two) times daily. 180 tablet 3   Fluticasone-Salmeterol (ADVAIR) 250-50 MCG/DOSE AEPB Inhale 1 puff into the lungs 2 (two) times daily.      furosemide (LASIX) 40 MG tablet Take one tablet by mouth on Mondays and Fridays     pantoprazole (PROTONIX) 40 MG tablet Take 40 mg by mouth daily.     potassium chloride (KLOR-CON) 10 MEQ tablet Take one tablet by mouth daily     sacubitril-valsartan (ENTRESTO) 97-103 MG Take 1 tablet by mouth 2 (two) times daily. 180 tablet 3   tadalafil (CIALIS) 5 MG tablet Take 5 mg by mouth daily.     amiodarone (PACERONE) 200 MG tablet Take 0.5 tablets (100 mg total) by mouth daily. 30 tablet 1   No current facility-administered medications for this encounter.    Allergies  Allergen Reactions   Ibuprofen Other (See Comments)    Has a-Fib, unable to take   Isosorbide Other (See Comments)    HEADACHES from Imdur   Lisinopril Cough   Digoxin And Related Rash    Social History   Socioeconomic History   Marital status: Married    Spouse name: Not on file   Number of children: Not on file   Years of education: Not on file   Highest education level: Not on file  Occupational History   Occupation: GREENHOUSE MANAGEMENT  Tobacco Use   Smoking status: Never   Smokeless tobacco: Never  Vaping Use   Vaping Use: Never used  Substance and Sexual Activity   Alcohol use: No   Drug use: No   Sexual activity: Not on file  Other Topics Concern   Not on file  Social History Narrative   Lives in [place] with his spouse   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  Physical Exam: Vitals:   07/17/20 1506  BP: 138/80  Pulse: 72  Weight: 107.4 kg  Height: 6' (1.829 m)     GEN- The patient is a well appearing obese male, alert and oriented x 3 today.   HEENT-head normocephalic, atraumatic, sclera clear, conjunctiva pink, hearing intact, trachea midline. Lungs- Clear to ausculation bilaterally, normal work of breathing Heart- irregular rate and rhythm, no murmurs, rubs or gallops  GI- soft, NT, ND, + BS Extremities- no clubbing, cyanosis. Trace bilateral edema MS- no significant deformity or atrophy Skin- no rash  or lesion Psych- euthymic mood, full affect Neuro- strength and sensation are intact   Wt Readings from Last 3 Encounters:  07/17/20 107.4 kg  07/11/20 104.8 kg  07/11/20 105.4 kg    EKG today demonstrates  Vent. rate 102 BPM PR interval * ms QRS duration 162 ms QT/QTcB 422/550 ms P-R-T axes * 87 -30 Atrial fibrillation with rapid ventricular response Right bundle branch block T wave abnormality, consider inferior ischemia Abnormal ECG When compared with ECG of 05-Aug-2021 15:36, PREVIOUS ECG IS PRESENT  Echo 08/01/18 demonstrated  1. The left ventricle has a visually estimated ejection fraction of 50%.  The cavity size was normal. Left ventricular diastolic Doppler parameters  are consistent with impaired relaxation. Left ventricular diffuse  hypokinesis.   2. The right ventricle has normal systolic function. The cavity was  normal. There is no increase in right ventricular wall thickness.   3. Left atrial size was mildly dilated.   4. Right atrial size was mildly dilated.   5. No evidence of mitral  valve stenosis. Trivial mitral regurgitation.   6. The aortic valve is tricuspid. Aortic valve regurgitation is trivial  by color flow Doppler. No stenosis of the aortic valve.   7. The aortic root is normal in size and structure.   8. The inferior vena cava was dilated in size with >50% respiratory  variability. PA systolic pressure 32 mmHg.   Epic records are reviewed at length today  CHA2DS2-VASc Score = 2 The patient's score is based upon: CHF History: Yes HTN History: Yes Age : < 65 Diabetes History: No Stroke History: No Vascular Disease History: No Gender: Male   ASSESSMENT AND PLAN: 1. Persistent Atrial Fibrillation (ICD10:  I48.19) The patient's CHA2DS2-VASc score is 2, indicating a 2.2% annual risk of stroke.   S/p afib ablation with Dr Rayann Heman 03/16/19. S/p DCCV 07/11/20 We discussed therapeutic options today. We will pursue cardioversion He will now take 200 mg amiodarone daily  as he self reloaded on amiodarone 400 mg bid for the last week Continue Eliquis 5 mg BID, states no missed doses for the last 3 weeks    2. Secondary Hypercoagulable State (ICD10:  D68.69) The patient is at significant risk for stroke/thromboembolism based upon his CHA2DS2-VASc Score of 2.  Continue Apixaban (Eliquis).  States no missed doses for the last 3 weeks  3. Obstructive sleep apnea Sleep study 05/12/21 with mild OSA Has never gotten CPAP  4. HTN Stable, no changes today.  5. Tachycardia mediated CM/CHF Normalized with SR  I will see back after cardioversion but will refer to EP to reestablish with Dr. Jackalyn Lombard absence to discuss a possible repeat ablation   Butch Penny C. Kyera Felan, [facility] [address] [place], [place] [address] [phone]

## 2022-01-08 ENCOUNTER — Telehealth (HOSPITAL_BASED_OUTPATIENT_CLINIC_OR_DEPARTMENT_OTHER): Payer: Self-pay | Admitting: Cardiology

## 2022-01-08 NOTE — Telephone Encounter (Signed)
Spoke with patient and he was returning [place]'s call, will forward to her

## 2022-01-08 NOTE — Telephone Encounter (Signed)
Patient stated he received a call from a nurse and he is returning the call.  Patient stated he thinks this may be regarding his procedure on Tuesday (12/19).

## 2022-01-12 ENCOUNTER — Ambulatory Visit (HOSPITAL_BASED_OUTPATIENT_CLINIC_OR_DEPARTMENT_OTHER): Payer: Medicare HMO | Admitting: Certified Registered"

## 2022-01-12 ENCOUNTER — Encounter (HOSPITAL_COMMUNITY): Payer: Self-pay | Admitting: Cardiology

## 2022-01-12 ENCOUNTER — Ambulatory Visit (HOSPITAL_COMMUNITY)
Admission: RE | Admit: 2022-01-12 | Discharge: 2022-01-12 | Disposition: A | Discharge status: Home / Self Care | Payer: Medicare HMO | Attending: Cardiology | Admitting: Cardiology

## 2022-01-12 ENCOUNTER — Other Ambulatory Visit: Payer: Self-pay

## 2022-01-12 ENCOUNTER — Other Ambulatory Visit (HOSPITAL_COMMUNITY): Payer: Medicare HMO | Admitting: Physician Assistant

## 2022-01-12 ENCOUNTER — Ambulatory Visit (HOSPITAL_COMMUNITY): Payer: Medicare HMO | Admitting: Certified Registered"

## 2022-01-12 ENCOUNTER — Encounter (HOSPITAL_COMMUNITY)
Admission: RE | Disposition: A | Discharge status: Home / Self Care | Payer: Self-pay | Source: Home / Self Care | Attending: Cardiology

## 2022-01-12 DIAGNOSIS — I130 Hypertensive heart and chronic kidney disease with heart failure and stage 1 through stage 4 chronic kidney disease, or unspecified chronic kidney disease: Secondary | ICD-10-CM | POA: Diagnosis not present

## 2022-01-12 DIAGNOSIS — I4891 Unspecified atrial fibrillation: Secondary | ICD-10-CM

## 2022-01-12 DIAGNOSIS — Z7901 Long term (current) use of anticoagulants: Secondary | ICD-10-CM | POA: Diagnosis not present

## 2022-01-12 DIAGNOSIS — I509 Heart failure, unspecified: Secondary | ICD-10-CM | POA: Insufficient documentation

## 2022-01-12 DIAGNOSIS — N189 Chronic kidney disease, unspecified: Secondary | ICD-10-CM | POA: Insufficient documentation

## 2022-01-12 DIAGNOSIS — G4733 Obstructive sleep apnea (adult) (pediatric): Secondary | ICD-10-CM | POA: Insufficient documentation

## 2022-01-12 DIAGNOSIS — G4730 Sleep apnea, unspecified: Secondary | ICD-10-CM | POA: Diagnosis not present

## 2022-01-12 DIAGNOSIS — K219 Gastro-esophageal reflux disease without esophagitis: Secondary | ICD-10-CM | POA: Diagnosis not present

## 2022-01-12 DIAGNOSIS — D6869 Other thrombophilia: Secondary | ICD-10-CM | POA: Insufficient documentation

## 2022-01-12 DIAGNOSIS — I110 Hypertensive heart disease with heart failure: Secondary | ICD-10-CM | POA: Diagnosis not present

## 2022-01-12 DIAGNOSIS — I4819 Other persistent atrial fibrillation: Secondary | ICD-10-CM | POA: Diagnosis not present

## 2022-01-12 HISTORY — PX: CARDIOVERSION: SHX1299

## 2022-01-12 LAB — POCT I-STAT, CHEM 8
BUN: 23 mg/dL (ref 8–23)
Calcium, Ion: 1.13 mmol/L — ABNORMAL LOW (ref 1.15–1.40)
Chloride: 107 mmol/L (ref 98–111)
Creatinine, Ser: 1.9 mg/dL — ABNORMAL HIGH (ref 0.61–1.24)
Glucose, Bld: 93 mg/dL (ref 70–99)
HCT: 39 % (ref 39.0–52.0)
Hemoglobin: 13.3 g/dL (ref 13.0–17.0)
Potassium: 4.1 mmol/L (ref 3.5–5.1)
Sodium: 141 mmol/L (ref 135–145)
TCO2: 23 mmol/L (ref 22–32)

## 2022-01-12 SURGERY — CARDIOVERSION
Anesthesia: General

## 2022-01-12 MED ORDER — PROPOFOL 10 MG/ML IV BOLUS
INTRAVENOUS | Status: DC | PRN
  Administered 2022-01-12: 60 mg via INTRAVENOUS

## 2022-01-12 MED ORDER — SODIUM CHLORIDE 0.9 % IV SOLN: INTRAVENOUS | Status: DC

## 2022-01-12 MED ORDER — LIDOCAINE HCL (CARDIAC) PF 100 MG/5ML IV SOSY
PREFILLED_SYRINGE | INTRAVENOUS | Status: DC | PRN
  Administered 2022-01-12: 60 mg via INTRAVENOUS

## 2022-01-12 NOTE — Transfer of Care (Signed)
Immediate Anesthesia Transfer of Care Note  Patient: [name]  Procedure(s) Performed: CARDIOVERSION  Patient Location: PACU and Endoscopy Unit  Anesthesia Type:General  Level of Consciousness: awake and patient cooperative  Airway & Oxygen Therapy: Patient Spontanous Breathing and Patient connected to nasal cannula oxygen  Post-op Assessment: Report given to RN and Post -op Vital signs reviewed and stable  Post vital signs: Reviewed and stable  Last Vitals:  Vitals Value Taken Time  BP    Temp    Pulse    Resp    SpO2      Last Pain:  Vitals:   01/12/22 0911  PainSc: 0-No pain      Patients Stated Pain Goal: 3 (76/18/48 5927)  Complications: No notable events documented.

## 2022-01-12 NOTE — Interval H&P Note (Signed)
History and Physical Interval Note:  01/12/2022 9:23 AM  [name]  has presented today for surgery, with the diagnosis of AFIB.  The various methods of treatment have been discussed with the patient and family. After consideration of risks, benefits and other options for treatment, the patient has consented to  Procedure(s): CARDIOVERSION (N/A) as a surgical intervention.  The patient's history has been reviewed, patient examined, no change in status, stable for surgery.  I have reviewed the patient's chart and labs.  Questions were answered to the patient's satisfaction.     Dreyah Montrose Harrell Gave

## 2022-01-12 NOTE — CV Procedure (Signed)
Procedure:   DCCV  Indication:  Symptomatic atrial fibrillation  Procedure Note:  The patient signed informed consent.  They have had had therapeutic anticoagulation with apixaban greater than 3 weeks.  Anesthesia was administered by Dr. Lanetta Inch.  Patient received 60 mg IV lidocaine and 60 mg IV propofol.Adequate airway was maintained throughout and vital followed per protocol.  They were cardioverted x 1 with 150J of biphasic synchronized energy.  They converted to NSR.  There were no apparent complications.  The patient had normal neuro status and respiratory status post procedure with vitals stable as recorded elsewhere.    Follow up:  They will continue on current medical therapy and follow up with cardiology as scheduled.  Buford Dresser, MD PhD 01/12/2022 9:58 AM

## 2022-01-12 NOTE — Discharge Instructions (Signed)

## 2022-01-13 ENCOUNTER — Other Ambulatory Visit: Payer: Self-pay | Admitting: Internal Medicine

## 2022-01-13 NOTE — Anesthesia Preprocedure Evaluation (Signed)
Anesthesia Evaluation  Patient identified by MRN, date of birth, ID band Patient awake    Reviewed: Allergy & Precautions, NPO status , Patient's Chart, lab work & pertinent test results  Airway Mallampati: II  TM Distance: >3 FB Neck ROM: Full    Dental no notable dental hx. (+) Teeth Intact, Dental Advisory Given   Pulmonary asthma , sleep apnea    Pulmonary exam normal breath sounds clear to auscultation       Cardiovascular hypertension, +CHF  + dysrhythmias (eliquis) Atrial Fibrillation  Rhythm:Irregular Rate:Normal  TTE 2023  1. Left ventricular ejection fraction, by estimation, is 45 to 50%. The  left ventricle has mildly decreased function. The left ventricle  demonstrates global hypokinesis. The left ventricular internal cavity size  was severely dilated. Left ventricular  diastolic parameters are consistent with Grade I diastolic dysfunction  (impaired relaxation).   2. Right ventricular systolic function is normal. The right ventricular  size is normal. There is normal pulmonary artery systolic pressure.   3. Left atrial size was moderately dilated.   4. The mitral valve is normal in structure. Mild, eccentric, mitral valve  regurgitation. No evidence of mitral stenosis.   5. The aortic valve is tricuspid. Aortic valve regurgitation is not  visualized. No aortic stenosis is present.   6. The inferior vena cava is normal in size with greater than 50%  respiratory variability, suggesting right atrial pressure of 3 mmHg.     Neuro/Psych negative neurological ROS  negative psych ROS   GI/Hepatic Neg liver ROS,GERD  ,,  Endo/Other  negative endocrine ROS    Renal/GU negative Renal ROS  negative genitourinary   Musculoskeletal negative musculoskeletal ROS (+)    Abdominal   Peds  Hematology negative hematology ROS (+)   Anesthesia Other Findings   Reproductive/Obstetrics                              Anesthesia Physical Anesthesia Plan  ASA: 3  Anesthesia Plan: General   Post-op Pain Management:    Induction: Intravenous  PONV Risk Score and Plan: 2 and Propofol infusion and Treatment may vary due to age or medical condition  Airway Management Planned: Natural Airway  Additional Equipment:   Intra-op Plan:   Post-operative Plan:   Informed Consent: I have reviewed the patients History and Physical, chart, labs and discussed the procedure including the risks, benefits and alternatives for the proposed anesthesia with the patient or authorized representative who has indicated his/her understanding and acceptance.     Dental advisory given  Plan Discussed with: CRNA  Anesthesia Plan Comments:        Anesthesia Quick Evaluation

## 2022-01-13 NOTE — Anesthesia Postprocedure Evaluation (Signed)
Anesthesia Post Note  Patient: [name]  Procedure(s) Performed: CARDIOVERSION     Patient location during evaluation: Endoscopy Anesthesia Type: General Level of consciousness: awake and alert Pain management: pain level controlled Vital Signs Assessment: post-procedure vital signs reviewed and stable Respiratory status: spontaneous breathing, nonlabored ventilation, respiratory function stable and patient connected to nasal cannula oxygen Cardiovascular status: blood pressure returned to baseline and stable Postop Assessment: no apparent nausea or vomiting Anesthetic complications: no  No notable events documented.  Last Vitals:  Vitals:   01/12/22 1010 01/12/22 1022  BP: 106/86 111/82  Pulse: 62 63  Resp: 13 20  Temp:    SpO2: 95% 95%    Last Pain:  Vitals:   01/13/22 1238  TempSrc:   PainSc: 0-No pain                 Mccabe Gloria L Zienna Ahlin

## 2022-01-18 ENCOUNTER — Encounter (HOSPITAL_COMMUNITY): Payer: Self-pay | Admitting: Cardiology

## 2022-01-20 ENCOUNTER — Ambulatory Visit: Payer: Medicare HMO | Admitting: Cardiovascular Disease

## 2022-01-22 ENCOUNTER — Other Ambulatory Visit: Payer: Self-pay

## 2022-01-22 MED ORDER — AMIODARONE HCL 100 MG PO TABS: 100.0000 mg | ORAL_TABLET | Freq: Every day | ORAL | 0 refills | Status: DC

## 2022-01-27 ENCOUNTER — Ambulatory Visit (HOSPITAL_COMMUNITY)
Admission: RE | Admit: 2022-01-27 | Discharge: 2022-01-27 | Disposition: A | Discharge status: Home / Self Care | Payer: Medicare HMO | Source: Ambulatory Visit | Attending: Nurse Practitioner | Admitting: Nurse Practitioner

## 2022-01-27 ENCOUNTER — Encounter (HOSPITAL_COMMUNITY): Payer: Self-pay | Admitting: Nurse Practitioner

## 2022-01-27 VITALS — BP 132/78 | HR 60 | Ht 72.0 in | Wt 253.2 lb

## 2022-01-27 DIAGNOSIS — N189 Chronic kidney disease, unspecified: Secondary | ICD-10-CM | POA: Diagnosis not present

## 2022-01-27 DIAGNOSIS — I4819 Other persistent atrial fibrillation: Secondary | ICD-10-CM | POA: Diagnosis not present

## 2022-01-27 DIAGNOSIS — Z8249 Family history of ischemic heart disease and other diseases of the circulatory system: Secondary | ICD-10-CM | POA: Diagnosis not present

## 2022-01-27 DIAGNOSIS — I129 Hypertensive chronic kidney disease with stage 1 through stage 4 chronic kidney disease, or unspecified chronic kidney disease: Secondary | ICD-10-CM | POA: Diagnosis not present

## 2022-01-27 DIAGNOSIS — I10 Essential (primary) hypertension: Secondary | ICD-10-CM | POA: Insufficient documentation

## 2022-01-27 DIAGNOSIS — I509 Heart failure, unspecified: Secondary | ICD-10-CM | POA: Insufficient documentation

## 2022-01-27 DIAGNOSIS — N138 Other obstructive and reflux uropathy: Secondary | ICD-10-CM

## 2022-01-27 DIAGNOSIS — Z7901 Long term (current) use of anticoagulants: Secondary | ICD-10-CM | POA: Insufficient documentation

## 2022-01-27 DIAGNOSIS — D6869 Other thrombophilia: Secondary | ICD-10-CM | POA: Diagnosis not present

## 2022-01-27 DIAGNOSIS — G4733 Obstructive sleep apnea (adult) (pediatric): Secondary | ICD-10-CM | POA: Insufficient documentation

## 2022-01-27 DIAGNOSIS — R000 Tachycardia, unspecified: Secondary | ICD-10-CM | POA: Diagnosis not present

## 2022-01-27 DIAGNOSIS — N401 Enlarged prostate with lower urinary tract symptoms: Secondary | ICD-10-CM | POA: Diagnosis not present

## 2022-01-27 MED ORDER — AMIODARONE HCL 100 MG PO TABS: 100.0000 mg | ORAL_TABLET | Freq: Every day | ORAL | 2 refills | Status: DC

## 2022-01-27 NOTE — Progress Notes (Signed)
Primary Care Physician: [name], MD Primary Cardiologist: Dr Johnsie Cancel Primary Electrophysiologist: Dr Rayann Heman [facility]: Dr Haroldine Laws Referring Physician: Dr Rayann Heman   [name] is a 67 y.o. male with a history of persistent atrial fibrillation, tachycardia mediated CM, CKD, HTN, OSA who presents for follow up in the [facility] as he has been in afib x one week. On his own, he has been taking 400 mg bid of amiodrone x one week, but so far as not converted him.   The patient was initially diagnosed with atrial fibrillation 2018 after presenting with symptoms of SOB and fatigue. 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  He initially failed cardioversion and was placed on amiodarone.  With rhythm control, his EF has improved from 20% to 50%.  He did well for 2 years with low dose amiodarone.  (amiodarone had been reduced due to sinus bradycardia). Unfortunately, he continued to have episodes of afib. He underwent afib ablation on 03/16/19 with Dr Rayann Heman. Patient is on Eliquis for a CHADS2VASC score of 2 and normally on amiodarone 100 mg daily. No known trigger.   We discussed pursuing a cardioversion and pt is in agreement. He has noted fatigue in afib. He has been compliant with anticoagulation.   F/u in afib clinic, 01/27/22. He had a successful cardioversion and remains in SR today. I will lower amiodarone back to 100 mg daily.he feels improved. He will be seeing Dr. Myles Gip 1/23 for consideration of repeat ablation.  Today, he denies symptoms of palpitations, chest pain, PND, presyncope, syncope, bleeding, or neurologic sequela. The patient is tolerating medications without difficulties and is otherwise without complaint today.    Atrial Fibrillation Risk Factors:  he does have symptoms or diagnosis of sleep apnea. he is compliant with CPAP therapy.   he has a BMI of Body mass index is 34.34 kg/m.Marland Kitchen Filed Weights   01/27/22 1314  Weight: 114.9 kg      Family History  Problem Relation Age of Onset   Hypertension Mother      Atrial Fibrillation Management history:  Previous antiarrhythmic drugs: amiodarone Previous cardioversions: 03/2016 Previous ablations: 03/16/19 CHADS2VASC score: 2 Anticoagulation history: Eliquis   Past Medical History:  Diagnosis Date   Asthma, persistent    BPH (benign prostatic hyperplasia) 2014   CHF (congestive heart failure) (HCC)    Combined hyperlipidemia    Dysrhythmia    ATRIAL FIBRILLATION   GERD (gastroesophageal reflux disease)    History of kidney stones    Hypertension    Lymphocytic colitis 07/2011   MICROSCOPIC   Obesity    Persistent atrial fibrillation ([place])    Renal stone 04/2012   Seasonal allergic rhinitis    Sleep apnea    does not use a cpap machine   Past Surgical History:  Procedure Laterality Date   ATRIAL FIBRILLATION ABLATION N/A 03/16/2019   Procedure: ATRIAL FIBRILLATION ABLATION;  Surgeon: Thompson Grayer, MD;  Location: [facility];  Service: Cardiovascular;  Laterality: N/A;   CARDIOVERSION N/A 04/23/2016   Procedure: CARDIOVERSION;  Surgeon: Jolaine Artist, MD;  Location: [facility] ENDOSCOPY;  Service: Cardiovascular;  Laterality: N/A;   CARDIOVERSION N/A 01/12/2022   Procedure: CARDIOVERSION;  Surgeon: Buford Dresser, MD;  Location: [facility] ENDOSCOPY;  Service: Cardiovascular;  Laterality: N/A;   CYSTOSCOPY WITH RETROGRADE PYELOGRAM, URETEROSCOPY AND STENT PLACEMENT Left 07/29/2017   Procedure: CYSTOSCOPY WITH RETROGRADE PYELOGRAM AND LEFT STENT PLACEMENT;  Surgeon: Franchot Gallo, MD;  Location: [facility];  Service: Urology;  Laterality: Left;   EXTRACORPOREAL SHOCK WAVE LITHOTRIPSY Left 08/08/2017   Procedure: LEFT EXTRACORPOREAL SHOCK WAVE LITHOTRIPSY (ESWL);  Surgeon: Nickie Retort, MD;  Location: [facility];  Service: Urology;  Laterality: Left;   TEE WITHOUT CARDIOVERSION N/A 04/23/2016   Procedure: TRANSESOPHAGEAL ECHOCARDIOGRAM (TEE);  Surgeon:  Jolaine Artist, MD;  Location: [facility] ENDOSCOPY;  Service: Cardiovascular;  Laterality: N/A;   XI ROBOTIC ASSISTED SIMPLE PROSTATECTOMY N/A 12/25/2018   Procedure: XI ROBOTIC ASSISTED SIMPLE PROSTATECTOMY, REMOVAL OF BLADDER STONES;  Surgeon: Cleon Gustin, MD;  Location: [facility];  Service: Urology;  Laterality: N/A;    Current Outpatient Medications  Medication Sig Dispense Refill   acetaminophen (TYLENOL) 325 MG tablet Take 2 tablets (650 mg total) by mouth every 6 (six) hours as needed for mild pain or headache.     albuterol (PROVENTIL HFA;VENTOLIN HFA) 108 (90 Base) MCG/ACT inhaler Inhale 2 puffs into the lungs every 6 (six) hours as needed for wheezing or shortness of breath.     amiodarone (PACERONE) 100 MG tablet Take 1 tablet (100 mg total) by mouth daily. Please keep upcoming appointment for future refills. Thank you. (Patient taking differently: Take 200 mg by mouth daily. Please keep upcoming appointment for future refills. Thank you.) 30 tablet 0   amLODipine (NORVASC) 5 MG tablet TAKE 1 TABLET (5 MG TOTAL) BY MOUTH DAILY. 90 tablet 1   apixaban (ELIQUIS) 5 MG TABS tablet Take 1 tablet (5 mg total) by mouth 2 (two) times daily. 180 tablet 3   ENTRESTO 97-103 MG TAKE 1 TABLET BY MOUTH TWICE A DAY 60 tablet 6   eplerenone (INSPRA) 25 MG tablet TAKE 1/2 TABLET BY MOUTH EVERY DAY 45 tablet 2   Fluticasone-Salmeterol (ADVAIR) 250-50 MCG/DOSE AEPB Inhale 1 puff into the lungs 2 (two) times daily.      furosemide (LASIX) 40 MG tablet Take one tablet by mouth on Mondays and Fridays     pantoprazole (PROTONIX) 40 MG tablet Take 40 mg by mouth daily.     tadalafil (CIALIS) 5 MG tablet Take 5 mg by mouth daily.     No current facility-administered medications for this encounter.    Allergies  Allergen Reactions   Ibuprofen Other (See Comments)    Has a-Fib, unable to take   Isosorbide Other (See Comments)    HEADACHES from [employer]dur   Lisinopril Cough   Digoxin And Related Rash    Spironolactone Rash    Social History   Socioeconomic History   Marital status: Married    Spouse name: Not on file   Number of children: Not on file   Years of education: Not on file   Highest education level: Not on file  Occupational History   Occupation: GREENHOUSE MANAGEMENT  Tobacco Use   Smoking status: Never   Smokeless tobacco: Never  Vaping Use   Vaping Use: Never used  Substance and Sexual Activity   Alcohol use: No   Drug use: No   Sexual activity: Not on file  Other Topics Concern   Not on file  Social History Narrative   Lives in [place] with his spouse   He works as a Research scientist (life sciences) Strain: Not on Art therapist Insecurity: Not on file  Transportation Needs: Not on file  Physical Activity: Not on file  Stress: Not on file  Social Connections: Not on file  Intimate Partner Violence: Not on file  ROS- All systems are reviewed and negative except as per the HPI above.  Physical Exam: Vitals:   01/27/22 1314  BP: 132/78  Pulse: 60  Weight: 114.9 kg  Height: 6' (1.829 m)     GEN- The patient is a well appearing obese male, alert and oriented x 3 today.   HEENT-head normocephalic, atraumatic, sclera clear, conjunctiva pink, hearing intact, trachea midline. Lungs- Clear to ausculation bilaterally, normal work of breathing Heart- regular rate and rhythm, no murmurs, rubs or gallops  GI- soft, NT, ND, + BS Extremities- no clubbing, cyanosis. Trace bilateral edema MS- no significant deformity or atrophy Skin- no rash or lesion Psych- euthymic mood, full affect Neuro- strength and sensation are intact   Wt Readings from Last 3 Encounters:  01/27/22 114.9 kg  01/12/22 114.8 kg  12/29/21 115 kg    EKG  Vent. rate 60 BPM PR interval 166 ms QRS duration 154 ms QT/QTcB 522/522 ms P-R-T axes 71 86 21 Sinus rhythm with occasional Premature ventricular complexes Right bundle branch  block Abnormal ECG When compared with ECG of 12-Jan-2022 10:11, PREVIOUS ECG IS PRESENT  Echo 08/01/18 demonstrated  1. The left ventricle has a visually estimated ejection fraction of 50%.  The cavity size was normal. Left ventricular diastolic Doppler parameters  are consistent with impaired relaxation. Left ventricular diffuse  hypokinesis.   2. The right ventricle has normal systolic function. The cavity was  normal. There is no increase in right ventricular wall thickness.   3. Left atrial size was mildly dilated.   4. Right atrial size was mildly dilated.   5. No evidence of mitral valve stenosis. Trivial mitral regurgitation.   6. The aortic valve is tricuspid. Aortic valve regurgitation is trivial  by color flow Doppler. No stenosis of the aortic valve.   7. The aortic root is normal in size and structure.   8. The inferior vena cava was dilated in size with >50% respiratory  variability. PA systolic pressure 32 mmHg.   Epic records are reviewed at length today  CHA2DS2-VASc Score = 2 The patient's score is based upon: CHF History: Yes HTN History: Yes Age : < 65 Diabetes History: No Stroke History: No Vascular Disease History: No Gender: Male   ASSESSMENT AND PLAN: 1. Persistent Atrial Fibrillation (ICD10:  I48.19) The patient's CHA2DS2-VASc score is 2, indicating a 2.2% annual risk of stroke.   S/p afib ablation with Dr Rayann Heman 03/16/19. S/p DCCV 07/11/20 and most recently 01/12/22 which was successful Remains in SR He will go back to 100 mg amiodarone  Continue Eliquis 5 mg BID   2. Secondary Hypercoagulable State (ICD10:  D68.69) The patient is at significant risk for stroke/thromboembolism based upon his CHA2DS2-VASc Score of 2.  Continue Apixaban (Eliquis).  States no missed doses for the last 3 weeks  3. Obstructive sleep apnea Sleep study 05/12/21 with mild OSA Has never gotten CPAP  4. HTN Stable, no changes today.  5. Tachycardia mediated  CM/CHF Normalized with SR  F/u with Dr. Myles Gip 01/27/21  Geroge Baseman. Hartlyn Reigel, [facility] [address] [place], [place] [address] [phone]

## 2022-02-06 ENCOUNTER — Other Ambulatory Visit: Payer: Self-pay

## 2022-02-06 ENCOUNTER — Encounter (HOSPITAL_COMMUNITY): Payer: Self-pay

## 2022-02-06 ENCOUNTER — Emergency Department (HOSPITAL_COMMUNITY)
Admission: EM | Admit: 2022-02-06 | Discharge: 2022-02-06 | Discharge status: Against Medical Advice | Payer: Medicare HMO | Source: Home / Self Care | Attending: Emergency Medicine | Admitting: Emergency Medicine

## 2022-02-06 ENCOUNTER — Emergency Department (HOSPITAL_COMMUNITY): Payer: Medicare HMO

## 2022-02-06 DIAGNOSIS — K436 Other and unspecified ventral hernia with obstruction, without gangrene: Secondary | ICD-10-CM | POA: Insufficient documentation

## 2022-02-06 DIAGNOSIS — K420 Umbilical hernia with obstruction, without gangrene: Secondary | ICD-10-CM | POA: Diagnosis not present

## 2022-02-06 DIAGNOSIS — K6389 Other specified diseases of intestine: Secondary | ICD-10-CM | POA: Diagnosis not present

## 2022-02-06 DIAGNOSIS — Z79899 Other long term (current) drug therapy: Secondary | ICD-10-CM | POA: Insufficient documentation

## 2022-02-06 DIAGNOSIS — Z7901 Long term (current) use of anticoagulants: Secondary | ICD-10-CM | POA: Insufficient documentation

## 2022-02-06 DIAGNOSIS — D649 Anemia, unspecified: Secondary | ICD-10-CM | POA: Insufficient documentation

## 2022-02-06 DIAGNOSIS — K449 Diaphragmatic hernia without obstruction or gangrene: Secondary | ICD-10-CM | POA: Diagnosis not present

## 2022-02-06 DIAGNOSIS — D72829 Elevated white blood cell count, unspecified: Secondary | ICD-10-CM | POA: Insufficient documentation

## 2022-02-06 DIAGNOSIS — Z5329 Procedure and treatment not carried out because of patient's decision for other reasons: Secondary | ICD-10-CM | POA: Insufficient documentation

## 2022-02-06 DIAGNOSIS — J690 Pneumonitis due to inhalation of food and vomit: Secondary | ICD-10-CM | POA: Diagnosis not present

## 2022-02-06 DIAGNOSIS — N179 Acute kidney failure, unspecified: Secondary | ICD-10-CM | POA: Diagnosis not present

## 2022-02-06 DIAGNOSIS — I700 Atherosclerosis of aorta: Secondary | ICD-10-CM | POA: Diagnosis not present

## 2022-02-06 DIAGNOSIS — J9811 Atelectasis: Secondary | ICD-10-CM | POA: Diagnosis not present

## 2022-02-06 DIAGNOSIS — R944 Abnormal results of kidney function studies: Secondary | ICD-10-CM | POA: Insufficient documentation

## 2022-02-06 DIAGNOSIS — R1012 Left upper quadrant pain: Secondary | ICD-10-CM | POA: Diagnosis not present

## 2022-02-06 DIAGNOSIS — I10 Essential (primary) hypertension: Secondary | ICD-10-CM | POA: Diagnosis not present

## 2022-02-06 DIAGNOSIS — Z4682 Encounter for fitting and adjustment of non-vascular catheter: Secondary | ICD-10-CM | POA: Diagnosis not present

## 2022-02-06 DIAGNOSIS — K4090 Unilateral inguinal hernia, without obstruction or gangrene, not specified as recurrent: Secondary | ICD-10-CM | POA: Diagnosis not present

## 2022-02-06 DIAGNOSIS — K56609 Unspecified intestinal obstruction, unspecified as to partial versus complete obstruction: Secondary | ICD-10-CM | POA: Diagnosis not present

## 2022-02-06 DIAGNOSIS — K430 Incisional hernia with obstruction, without gangrene: Secondary | ICD-10-CM | POA: Diagnosis not present

## 2022-02-06 DIAGNOSIS — K460 Unspecified abdominal hernia with obstruction, without gangrene: Secondary | ICD-10-CM

## 2022-02-06 LAB — COMPREHENSIVE METABOLIC PANEL
ALT: 14 U/L (ref 0–44)
AST: 22 U/L (ref 15–41)
Albumin: 3.9 g/dL (ref 3.5–5.0)
Alkaline Phosphatase: 73 U/L (ref 38–126)
Anion gap: 11 (ref 5–15)
BUN: 20 mg/dL (ref 8–23)
CO2: 24 mmol/L (ref 22–32)
Calcium: 9.2 mg/dL (ref 8.9–10.3)
Chloride: 101 mmol/L (ref 98–111)
Creatinine, Ser: 1.66 mg/dL — ABNORMAL HIGH (ref 0.61–1.24)
GFR, Estimated: 45 mL/min — ABNORMAL LOW (ref >60)
Glucose, Bld: 121 mg/dL — ABNORMAL HIGH (ref 70–99)
Potassium: 3.8 mmol/L (ref 3.5–5.1)
Sodium: 136 mmol/L (ref 135–145)
Total Bilirubin: 1.3 mg/dL — ABNORMAL HIGH (ref 0.3–1.2)
Total Protein: 7.3 g/dL (ref 6.5–8.1)

## 2022-02-06 LAB — URINALYSIS, ROUTINE W REFLEX MICROSCOPIC
Bacteria, UA: NONE SEEN
Bilirubin Urine: NEGATIVE
Glucose, UA: NEGATIVE mg/dL
Hgb urine dipstick: NEGATIVE
Ketones, ur: 20 mg/dL — AB
Leukocytes,Ua: NEGATIVE
Nitrite: NEGATIVE
Protein, ur: 100 mg/dL — AB
Specific Gravity, Urine: 1.02 (ref 1.005–1.030)
pH: 5 (ref 5.0–8.0)

## 2022-02-06 LAB — I-STAT CHEM 8, ED
BUN: 23 mg/dL (ref 8–23)
Calcium, Ion: 1.12 mmol/L — ABNORMAL LOW (ref 1.15–1.40)
Chloride: 102 mmol/L (ref 98–111)
Creatinine, Ser: 1.7 mg/dL — ABNORMAL HIGH (ref 0.61–1.24)
Glucose, Bld: 124 mg/dL — ABNORMAL HIGH (ref 70–99)
HCT: 42 % (ref 39.0–52.0)
Hemoglobin: 14.3 g/dL (ref 13.0–17.0)
Potassium: 3.9 mmol/L (ref 3.5–5.1)
Sodium: 138 mmol/L (ref 135–145)
TCO2: 24 mmol/L (ref 22–32)

## 2022-02-06 LAB — CBC WITH DIFFERENTIAL/PLATELET
Abs Immature Granulocytes: 0.07 10*3/uL (ref 0.00–0.07)
Basophils Absolute: 0 10*3/uL (ref 0.0–0.1)
Basophils Relative: 0 %
Eosinophils Absolute: 0 10*3/uL (ref 0.0–0.5)
Eosinophils Relative: 0 %
HCT: 39.7 % (ref 39.0–52.0)
Hemoglobin: 12.8 g/dL — ABNORMAL LOW (ref 13.0–17.0)
Immature Granulocytes: 0 %
Lymphocytes Relative: 3 %
Lymphs Abs: 0.6 10*3/uL — ABNORMAL LOW (ref 0.7–4.0)
MCH: 24.4 pg — ABNORMAL LOW (ref 26.0–34.0)
MCHC: 32.2 g/dL (ref 30.0–36.0)
MCV: 75.8 fL — ABNORMAL LOW (ref 80.0–100.0)
Monocytes Absolute: 1.2 10*3/uL — ABNORMAL HIGH (ref 0.1–1.0)
Monocytes Relative: 7 %
Neutro Abs: 15.1 10*3/uL — ABNORMAL HIGH (ref 1.7–7.7)
Neutrophils Relative %: 90 %
Platelets: 390 10*3/uL (ref 150–400)
RBC: 5.24 MIL/uL (ref 4.22–5.81)
RDW: 17.2 % — ABNORMAL HIGH (ref 11.5–15.5)
WBC: 17 10*3/uL — ABNORMAL HIGH (ref 4.0–10.5)
nRBC: 0 % (ref 0.0–0.2)

## 2022-02-06 LAB — LIPASE, BLOOD: Lipase: 36 U/L (ref 11–51)

## 2022-02-06 LAB — LACTIC ACID, PLASMA: Lactic Acid, Venous: 1.5 mmol/L (ref 0.5–1.9)

## 2022-02-06 MED ORDER — IOHEXOL 350 MG/ML SOLN
75.0000 mL | Freq: Once | INTRAVENOUS | Status: AC | PRN
  Administered 2022-02-06: 75 mL via INTRAVENOUS

## 2022-02-06 MED ORDER — ACETAMINOPHEN 500 MG PO TABS
1000.0000 mg | ORAL_TABLET | Freq: Once | ORAL | Status: AC
  Administered 2022-02-06: 1000 mg via ORAL
  Filled 2022-02-06: qty 2

## 2022-02-06 MED ORDER — OXYCODONE-ACETAMINOPHEN 5-325 MG PO TABS: 1.0000 | ORAL_TABLET | Freq: Four times a day (QID) | ORAL | 0 refills | Status: DC | PRN

## 2022-02-06 MED ORDER — HYDROMORPHONE HCL 1 MG/ML IJ SOLN
1.0000 mg | Freq: Once | INTRAMUSCULAR | Status: AC
  Administered 2022-02-06: 1 mg via INTRAVENOUS
  Filled 2022-02-06: qty 1

## 2022-02-06 MED ORDER — PIPERACILLIN-TAZOBACTAM 3.375 G IVPB
3.3750 g | Freq: Once | INTRAVENOUS | Status: AC
  Administered 2022-02-06: 3.375 g via INTRAVENOUS
  Filled 2022-02-06: qty 50

## 2022-02-06 MED ORDER — AMOXICILLIN-POT CLAVULANATE 875-125 MG PO TABS: 1.0000 | ORAL_TABLET | Freq: Two times a day (BID) | ORAL | 0 refills | Status: DC

## 2022-02-06 MED ORDER — ONDANSETRON HCL 4 MG/2ML IJ SOLN
4.0000 mg | Freq: Once | INTRAMUSCULAR | Status: AC
  Administered 2022-02-06: 4 mg via INTRAVENOUS
  Filled 2022-02-06: qty 2

## 2022-02-06 MED ORDER — DOCUSATE SODIUM 100 MG PO CAPS: 100.0000 mg | ORAL_CAPSULE | Freq: Two times a day (BID) | ORAL | 0 refills | Status: DC

## 2022-02-06 MED ORDER — ONDANSETRON 4 MG PO TBDP: 4.0000 mg | ORAL_TABLET | Freq: Three times a day (TID) | ORAL | 0 refills | Status: DC | PRN

## 2022-02-06 NOTE — ED Provider Notes (Cosign Needed)
**Note Juan-Identified via Obfuscation** [facility] EMERGENCY DEPARTMENT Provider Note   CSN: [id number] Arrival date & time: 02/06/22  1118     History  Chief Complaint  Patient presents with   Abdominal Pain    [name] is a 67 y.o. male.  Patient with history of kidney surgery, atrial fibrillation on anticoagulation, cardioversion about a month ago per his report --presents to the emergency department for evaluation of abdominal pain and swelling that started about 4 PM yesterday.  Patient states that he was working his yard yesterday, pulling limbs and exerting himself.  He developed swelling in the mid abdomen.  This was associated with multiple episodes of vomiting overnight.  He has passed a small amount of stool.  No fevers, chest pain.  He states that he tried to take his medications this morning including Eliquis but vomited afterwards.       Home Medications Prior to Admission medications   Medication Sig Start Date End Date Taking? Authorizing Provider  acetaminophen (TYLENOL) 325 MG tablet Take 2 tablets (650 mg total) by mouth every 6 (six) hours as needed for mild pain or headache. 04/27/16   Shirley Friar, PA-C  albuterol (PROVENTIL HFA;VENTOLIN HFA) 108 (90 Base) MCG/ACT inhaler Inhale 2 puffs into the lungs every 6 (six) hours as needed for wheezing or shortness of breath.    [provider]  amiodarone (PACERONE) 100 MG tablet Take 1 tablet (100 mg total) by mouth daily. May take an extra tablet daily for breakthrough afib as directed 01/27/22   Sherran Needs, NP  amLODipine (NORVASC) 5 MG tablet TAKE 1 TABLET (5 MG TOTAL) BY MOUTH DAILY. 12/11/21   Bensimhon, Shaune Pascal, MD  apixaban (ELIQUIS) 5 MG TABS tablet Take 1 tablet (5 mg total) by mouth 2 (two) times daily. 05/28/21   Bensimhon, Shaune Pascal, MD  ENTRESTO 97-103 MG TAKE 1 TABLET BY MOUTH TWICE A DAY 08/24/21   Bensimhon, Shaune Pascal, MD  eplerenone (INSPRA) 25 MG tablet TAKE 1/2 TABLET BY MOUTH EVERY DAY 11/03/21    Bensimhon, Shaune Pascal, MD  Fluticasone-Salmeterol (ADVAIR) 250-50 MCG/DOSE AEPB Inhale 1 puff into the lungs 2 (two) times daily.     [provider]  furosemide (LASIX) 40 MG tablet Take one tablet by mouth on Mondays and Fridays 08/29/19   Bensimhon, Shaune Pascal, MD  pantoprazole (PROTONIX) 40 MG tablet Take 40 mg by mouth daily.    [provider]  tadalafil (CIALIS) 5 MG tablet Take 5 mg by mouth daily.    [provider]      Allergies    Ibuprofen, Isosorbide, Lisinopril, Digoxin and related, and Spironolactone    Review of Systems   Review of Systems  Physical Exam Updated Vital Signs BP (!) 147/84   Pulse 63   Temp 98.9 F (37.2 C) (Oral)   Resp 15   Ht 6' (1.829 m)   Wt 114.8 kg   SpO2 97%   BMI 34.31 kg/m   Physical Exam Vitals and nursing note reviewed.  Constitutional:      General: He is not in acute distress.    Appearance: He is well-developed.  HENT:     Head: Normocephalic and atraumatic.  Eyes:     General:        Right eye: No discharge.        Left eye: No discharge.     Conjunctiva/sclera: Conjunctivae normal.  Cardiovascular:     Rate and Rhythm: Normal rate and regular  rhythm.     Heart sounds: Normal heart sounds.  Pulmonary:     Effort: Pulmonary effort is normal.     Breath sounds: Normal breath sounds.  Abdominal:     Palpations: Abdomen is soft.     Tenderness: There is abdominal tenderness in the periumbilical area.     Hernia: A hernia is present. Hernia is present in the umbilical area.  Musculoskeletal:     Cervical back: Normal range of motion and neck supple.  Skin:    General: Skin is warm and dry.  Neurological:     Mental Status: He is alert.     ED Results / Procedures / Treatments   Labs (all labs ordered are listed, but only abnormal results are displayed) Labs Reviewed  CBC WITH DIFFERENTIAL/PLATELET - Abnormal; Notable for the following components:      Result Value   WBC 17.0 (*)     Hemoglobin 12.8 (*)    MCV 75.8 (*)    MCH 24.4 (*)    RDW 17.2 (*)    Neutro Abs 15.1 (*)    Lymphs Abs 0.6 (*)    Monocytes Absolute 1.2 (*)    All other components within normal limits  COMPREHENSIVE METABOLIC PANEL - Abnormal; Notable for the following components:   Glucose, Bld 121 (*)    Creatinine, Ser 1.66 (*)    Total Bilirubin 1.3 (*)    GFR, Estimated 45 (*)    All other components within normal limits  URINALYSIS, ROUTINE W REFLEX MICROSCOPIC - Abnormal; Notable for the following components:   APPearance HAZY (*)    Ketones, ur 20 (*)    Protein, ur 100 (*)    All other components within normal limits  I-STAT CHEM 8, ED - Abnormal; Notable for the following components:   Creatinine, Ser 1.70 (*)    Glucose, Bld 124 (*)    Calcium, Ion 1.12 (*)    All other components within normal limits  LIPASE, BLOOD  LACTIC ACID, PLASMA  LACTIC ACID, PLASMA    ED ECG REPORT   Date: 02/06/2022  Rate: 64  Rhythm: normal sinus rhythm  QRS Axis: normal  Intervals: normal  ST/T Wave abnormalities: normal  Conduction Disutrbances:none  Narrative Interpretation:   Old EKG Reviewed: unchanged  I have personally reviewed the EKG tracing and agree with the computerized printout as noted.    Radiology CT ABDOMEN PELVIS W CONTRAST  Result Date: 02/06/2022 CLINICAL DATA:  Hernia suspected, abdominal wall large, hardened skin with overlying warmth. Not reproducible. EXAM: CT ABDOMEN AND PELVIS WITH CONTRAST TECHNIQUE: Multidetector CT imaging of the abdomen and pelvis was performed using the standard protocol following bolus administration of intravenous contrast. RADIATION DOSE REDUCTION: This exam was performed according to the departmental dose-optimization program which includes automated exposure control, adjustment of the mA and/or kV according to patient size and/or use of iterative reconstruction technique. CONTRAST:  72mL OMNIPAQUE IOHEXOL 350 MG/ML SOLN COMPARISON:   07/31/2018 FINDINGS: Lower chest: Mild right basilar atelectasis. Moderate hiatal hernia. Hepatobiliary: No focal liver abnormality is seen. Subtle hyperdensity in the gallbladder could reflect biliary sludge. No hyperdense gallstone. No evidence of wall thickening or pericholecystic inflammatory changes. No biliary dilatation. Pancreas: Unremarkable. No pancreatic ductal dilatation or surrounding inflammatory changes. Spleen: Normal in size without focal abnormality. Adrenals/Urinary Tract: Unremarkable adrenal glands. Multiple bilateral renal cysts including renal sinus cysts, not appreciably changed and do not require dedicated follow-up imaging. No renal stone or evidence of hydronephrosis. Urinary bladder  within normal limits. Stomach/Bowel: Moderate-sized hiatal hernia. Stomach is otherwise within normal limits. Small-bowel obstruction secondary to a supraumbilical abdominal wall hernia containing small bowel which is abnormal in appearance. Bowel within the hernia sac is fluid-filled with mucosal hyperenhancement. Surrounding fat stranding. There is dilation of bowel upstream to the hernia. The small bowel distal to the hernia is decompressed. No focal colonic wall thickening or inflammatory changes. Vascular/Lymphatic: No significant vascular findings are present. No enlarged abdominal or pelvic lymph nodes. Reproductive: Prostate is unremarkable. Other: Prominent fat stranding within the subcutaneous soft tissues surrounding the supraumbilical hernia. No ascites. No pneumoperitoneum. Prior left inguinal hernia repair. Small fat containing right inguinal hernia. Musculoskeletal: No acute osseous abnormality. IMPRESSION: 1. Small-bowel obstruction secondary to a supraumbilical abdominal wall hernia containing small bowel. Distended small bowel within the hernia sac is fluid-filled with mucosal hyperenhancement and surrounding fat stranding. Findings are concerning for strangulation. Surgical consultation is  recommended. 2. Moderate-sized hiatal hernia. 3. Subtle hyperdensity in the gallbladder could reflect biliary sludge. No evidence of acute cholecystitis. 4. Small fat containing right inguinal hernia. These results were called by telephone at the time of interpretation on 02/06/2022 at 2:01 pm to provider Dr. Rondel Oh, who verbally acknowledged these results. Electronically Signed   By: Davina Poke D.O.   On: 02/06/2022 14:06    Procedures Procedures    Medications Ordered in ED Medications  HYDROmorphone (DILAUDID) injection 1 mg (has no administration in time range)  ondansetron (ZOFRAN) injection 4 mg (has no administration in time range)  piperacillin-tazobactam (ZOSYN) IVPB 3.375 g (has no administration in time range)  iohexol (OMNIPAQUE) 350 MG/ML injection 75 mL (75 mLs Intravenous Contrast Given 02/06/22 1350)    ED Course/ Medical Decision Making/ A&P    Patient seen and examined. History obtained directly from patient. Work-up including labs, imaging, EKG ordered in triage, if performed, were reviewed.    Labs/EKG: Independently reviewed and interpreted.  This included: CBC with differential elevated white blood cell count at 17,000, hemoglobin slightly low at 12.8, platelets 390 otherwise unremarkable; CMP creatinine elevated 1.66, glucose 121; UA without compelling signs of infection.  Added lactate.  Imaging: Independently visualized and interpreted.  This included: CT of the abdomen pelvis with concerns for strangulated hernia and small bowel obstruction.  Medications/Fluids: Ordered: IV Dilaudid, IV Zofran, IV Zosyn.  Most recent vital signs reviewed and are as follows: BP 133/80   Pulse 61   Temp 98.9 F (37.2 C) (Oral)   Resp 11   Ht 6' (1.829 m)   Wt 114.8 kg   SpO2 99%   BMI 34.31 kg/m   Initial impression: Incarcerated periumbilical hernia, signs of strangulation.   2:17 PM I spoke with Dr. Rosendo Gros of general surgery who will see patient.  Patient  currently getting pain medications.  Will attempt reduction at bedside if pt can tolerate.  2:33 PM Reassessment performed. Patient appears stable. EKG as above, NSR.   I attempted to reduce hernia with manual pressure and manipulation. Pt was placed in trendelenburg and ice pack was applied prior.  Hernia sac is very firm and I don't feel like it was even partially reducing. Lactate drawn and sent. IV abx initiated.   Most current vital signs reviewed and are as follows: BP (!) 145/83   Pulse 62   Temp 98.9 F (37.2 C) (Oral)   Resp (!) 23   Ht 6' (1.829 m)   Wt 114.8 kg   SpO2 96%   BMI 34.31 kg/m  Plan: surgery consult.   3:43 PM Reassessment performed. Patient appears stable.  Patient seen by surgery, hernia manually reduced.  On my exam, hernia sac remains reduced.  Labs personally reviewed and interpreted including: Lactate is normal at 1.5.  Most current vital signs reviewed and are as follows: BP 132/75   Pulse 69   Temp 98.9 F (37.2 C) (Oral)   Resp 13   Ht 6' (1.829 m)   Wt 114.8 kg   SpO2 94%   BMI 34.31 kg/m   Plan: Will p.o. trial, ambulate.  Patient counseled on strict no lifting, pushing, pulling.  Will give surgery follow-up.  Signed out to Margarita Mail, PA-C at shift change who will reassess.   Click here for ABCD2, HEART and other calculatorsREFRESH Note before signing :1}                          Medical Decision Making Amount and/or Complexity of Data Reviewed Labs: ordered.  Risk Prescription drug management.   For this patient's complaint of abdominal pain, the following conditions were considered on the differential diagnosis: gastritis/PUD, enteritis/duodenitis, appendicitis, cholelithiasis/cholecystitis, cholangitis, pancreatitis, ruptured viscus, colitis, diverticulitis, small/large bowel obstruction, proctitis, cystitis, pyelonephritis, ureteral colic, aortic dissection, aortic aneurysm. Atypical chest etiologies were also considered  including ACS, PE, and pneumonia.          Final Clinical Impression(s) / ED Diagnoses Final diagnoses:  Incarcerated hernia    Rx / DC Orders ED Discharge Orders          Ordered    oxyCODONE-acetaminophen (PERCOCET/ROXICET) 5-325 MG tablet  Every 6 hours PRN        02/06/22 1546    ondansetron (ZOFRAN-ODT) 4 MG disintegrating tablet  Every 8 hours PRN        02/06/22 1546    docusate sodium (COLACE) 100 MG capsule  Every 12 hours        02/06/22 1546              Carlisle Cater, PA-C 02/06/22 1552

## 2022-02-06 NOTE — ED Provider Triage Note (Signed)
Emergency Medicine Provider Triage Evaluation Note  [name] , a 67 y.o. male  was evaluated in triage.  Pt complains of central abdominal pain since 4:00 yesterday.  He reports some nausea and vomiting.  No bowel changes.  No fevers..  Review of Systems  Positive:  Negative:   Physical Exam  BP 129/87 (BP Location: Right Arm)   Pulse 70   Temp 98.6 F (37 C) (Oral)   Resp 16   Ht 6' (1.829 m)   Wt 114.8 kg   SpO2 94%   BMI 34.31 kg/m  Gen:   Awake, no distress   Resp:  Normal effort  MSK:   Moves extremities without difficulty  Other:  Hardened area in the lower abdomen.  Firm.  Overlying warmth.  Concern for hernia.  Nonreducible.  Medical Decision Making  Medically screening exam initiated at 11:32 AM.  Appropriate orders placed.  [name] was informed that the remainder of the evaluation will be completed by another provider, this initial triage assessment does not replace that evaluation, and the importance of remaining in the ED until their evaluation is complete.  Concern for strangulated or incarcerated hernia.  I-STAT Chem-8 ordered.  CT alerted that they need to take the patient back.  Patient is being taken to CT now.   Sherrell Puller, PA-C 02/06/22 1210

## 2022-02-06 NOTE — ED Provider Notes (Signed)
+   incarcerated Hernia, new. Reduced by Derrell Lolling If lactic acid is elevated admit for obs   Physical Exam Constitutional:      Appearance: He is well-developed.  Abdominal:     Tenderness: There is abdominal tenderness.     Hernia: A hernia is present.       Comments: Large, firm area to the R uf the umbiilius, Surrounding erythema. Exquisitely ttp.   Neurological:     Mental Status: He is alert.      Patient completed Zosyn He is noted to have new onset fever- resolved with tylenol, On reevaluation patient has physical exam described above. I discussed my concerns with the patient which includes the potential fr intraabdominal infection s/p strangulated hernia reduction.  I discussed my plan to reach back out to general surgery. Patient is adamant that he does not want to stay any longer.    Patient has lactate wnl, but wbc 17,000 wit fever, He is not vomiting and was able to tolerate fluids  I discussed that he could become extremely sick, even septic with infection of this sort which includes and very real possibility of sever morbidity and mortality. He understands the risks of severe illness or death and has capacity to make this decision, and will leave AMA with Augmentin and strict return precautions.   Date: 02/06/2022 Patient: [name] Attending Provider: Dr. Ardith Dark or his authorized caregiver has made the decision for the patient to leave the emergency department against the advice of Deliah Strehlow,PA-C.   He or his authorized caregiver has been informed and understands the inherent risks, including death.  He or his authorized caregiver has decided to accept the responsibility for this decision. [name] and all necessary parties have been advised that he may return for further evaluation or treatment. His condition at time of discharge was Fair.  [name] had current vital signs as follows:  Blood pressure 99/76, pulse 65,  temperature 99.9 F (37.7 C), temperature source Oral, resp. rate 16, height 6' (1.829 m), weight 114.8 kg, SpO2 93 %.    Arthor Captain 02/07/2022         Arthor Captain, PA-C 02/07/22 1209    Mardene Sayer, MD 02/07/22 1216

## 2022-02-06 NOTE — Consult Note (Signed)
Reason for Consult: Incarcerated hernia Referring Physician: Dr. Lizabeth Leyden [name] is an 67 y.o. male.  HPI: Patient is a 67 year old male, with history of CHF, A-fib-on Xarelto, hypertension, morbid obesity.  Patient comes in secondary to incarcerated incisional hernia.  Patient states he had robotic prostatectomy and nephrolithiasis surgery several years ago.  Patient has supraneural umbilical midline incision.  He states that yesterday after chopping some wood and dragons and limbs he felt some soreness in his upper midline wound.  He states that this began to cause continued pain.  He had some nausea vomiting overnight.  He states that the pain continued so he presented to the ER.  Upon evaluation the ER underwent CT scan and laboratory studies.  CT scan was significant for incarcerated incisional hernia with SBO at the hernia site.  Patient also with leukocytosis.  I did review the patient's CT scan laboratory studies personally.  Past Medical History:  Diagnosis Date   Asthma, persistent    BPH (benign prostatic hyperplasia) 2014   CHF (congestive heart failure) (HCC)    Combined hyperlipidemia    Dysrhythmia    ATRIAL FIBRILLATION   GERD (gastroesophageal reflux disease)    History of kidney stones    Hypertension    Lymphocytic colitis 07/2011   MICROSCOPIC   Obesity    Persistent atrial fibrillation ([place])    Renal stone 04/2012   Seasonal allergic rhinitis    Sleep apnea    does not use a cpap machine    Past Surgical History:  Procedure Laterality Date   ATRIAL FIBRILLATION ABLATION N/A 03/16/2019   Procedure: ATRIAL FIBRILLATION ABLATION;  Surgeon: Thompson Grayer, MD;  Location: [facility];  Service: Cardiovascular;  Laterality: N/A;   CARDIOVERSION N/A 04/23/2016   Procedure: CARDIOVERSION;  Surgeon: Jolaine Artist, MD;  Location: [facility] ENDOSCOPY;  Service: Cardiovascular;  Laterality: N/A;   CARDIOVERSION N/A 01/12/2022   Procedure: CARDIOVERSION;   Surgeon: Buford Dresser, MD;  Location: [facility] ENDOSCOPY;  Service: Cardiovascular;  Laterality: N/A;   CYSTOSCOPY WITH RETROGRADE PYELOGRAM, URETEROSCOPY AND STENT PLACEMENT Left 07/29/2017   Procedure: CYSTOSCOPY WITH RETROGRADE PYELOGRAM AND LEFT STENT PLACEMENT;  Surgeon: Franchot Gallo, MD;  Location: [facility];  Service: Urology;  Laterality: Left;   EXTRACORPOREAL SHOCK WAVE LITHOTRIPSY Left 08/08/2017   Procedure: LEFT EXTRACORPOREAL SHOCK WAVE LITHOTRIPSY (ESWL);  Surgeon: Nickie Retort, MD;  Location: [facility];  Service: Urology;  Laterality: Left;   TEE WITHOUT CARDIOVERSION N/A 04/23/2016   Procedure: TRANSESOPHAGEAL ECHOCARDIOGRAM (TEE);  Surgeon: Jolaine Artist, MD;  Location: [facility] ENDOSCOPY;  Service: Cardiovascular;  Laterality: N/A;   XI ROBOTIC ASSISTED SIMPLE PROSTATECTOMY N/A 12/25/2018   Procedure: XI ROBOTIC ASSISTED SIMPLE PROSTATECTOMY, REMOVAL OF BLADDER STONES;  Surgeon: Cleon Gustin, MD;  Location: [facility];  Service: Urology;  Laterality: N/A;    Family History  Problem Relation Age of Onset   Hypertension Mother     Social History:  reports that he has never smoked. He has never used smokeless tobacco. He reports that he does not drink alcohol and does not use drugs.  Allergies:  Allergies  Allergen Reactions   Ibuprofen Other (See Comments)    Has a-Fib, unable to take   Isosorbide Other (See Comments)    HEADACHES from Imdur   Lisinopril Cough   Digoxin And Related Rash   Spironolactone Rash    Medications: I have reviewed the patient's current medications.  Results for orders placed or performed during the hospital  encounter of 02/06/22 (from the past 48 hour(s))  CBC with Differential     Status: Abnormal   Collection Time: 02/06/22 11:29 AM  Result Value Ref Range   WBC 17.0 (H) 4.0 - 10.5 K/uL   RBC 5.24 4.22 - 5.81 MIL/uL   Hemoglobin 12.8 (L) 13.0 - 17.0 g/dL   HCT 39.7 39.0 - 52.0 %   MCV 75.8 (L) 80.0 - 100.0 fL   MCH 24.4  (L) 26.0 - 34.0 pg   MCHC 32.2 30.0 - 36.0 g/dL   RDW 17.2 (H) 11.5 - 15.5 %   Platelets 390 150 - 400 K/uL   nRBC 0.0 0.0 - 0.2 %   Neutrophils Relative % 90 %   Neutro Abs 15.1 (H) 1.7 - 7.7 K/uL   Lymphocytes Relative 3 %   Lymphs Abs 0.6 (L) 0.7 - 4.0 K/uL   Monocytes Relative 7 %   Monocytes Absolute 1.2 (H) 0.1 - 1.0 K/uL   Eosinophils Relative 0 %   Eosinophils Absolute 0.0 0.0 - 0.5 K/uL   Basophils Relative 0 %   Basophils Absolute 0.0 0.0 - 0.1 K/uL   Immature Granulocytes 0 %   Abs Immature Granulocytes 0.07 0.00 - 0.07 K/uL    Comment: Performed at [facility], [address], [place], [place] [address]  Comprehensive metabolic panel     Status: Abnormal   Collection Time: 02/06/22 11:29 AM  Result Value Ref Range   Sodium 136 135 - 145 mmol/L   Potassium 3.8 3.5 - 5.1 mmol/L   Chloride 101 98 - 111 mmol/L   CO2 24 22 - 32 mmol/L   Glucose, Bld 121 (H) 70 - 99 mg/dL    Comment: Glucose reference range applies only to samples taken after fasting for at least 8 hours.   BUN 20 8 - 23 mg/dL   Creatinine, Ser 1.66 (H) 0.61 - 1.24 mg/dL   Calcium 9.2 8.9 - 10.3 mg/dL   Total Protein 7.3 6.5 - 8.1 g/dL   Albumin 3.9 3.5 - 5.0 g/dL   AST 22 15 - 41 U/L   ALT 14 0 - 44 U/L   Alkaline Phosphatase 73 38 - 126 U/L   Total Bilirubin 1.3 (H) 0.3 - 1.2 mg/dL   GFR, Estimated 45 (L) >60 mL/min    Comment: (NOTE) Calculated using the CKD-EPI Creatinine Equation (2021)    Anion gap 11 5 - 15    Comment: Performed at [facility] [address] [place], [place] [address]  Lipase, blood     Status: None   Collection Time: 02/06/22 11:29 AM  Result Value Ref Range   Lipase 36 11 - 51 U/L    Comment: Performed at [facility] [address], [place], [place] [address]  Urinalysis, Routine w reflex microscopic     Status: Abnormal   Collection Time: 02/06/22 11:39 AM  Result Value Ref Range   Color, Urine YELLOW YELLOW   APPearance HAZY (A) CLEAR    Specific Gravity, Urine 1.020 1.005 - 1.030   pH 5.0 5.0 - 8.0   Glucose, UA NEGATIVE NEGATIVE mg/dL   Hgb urine dipstick NEGATIVE NEGATIVE   Bilirubin Urine NEGATIVE NEGATIVE   Ketones, ur 20 (A) NEGATIVE mg/dL   Protein, ur 100 (A) NEGATIVE mg/dL   Nitrite NEGATIVE NEGATIVE   Leukocytes,Ua NEGATIVE NEGATIVE   RBC / HPF 0-5 0 - 5 RBC/hpf   WBC, UA 0-5 0 - 5 WBC/hpf  Bacteria, UA NONE SEEN NONE SEEN   Squamous Epithelial / HPF 0-5 0 - 5 /HPF   Mucus PRESENT    Hyaline Casts, UA PRESENT     Comment: Performed at [facility], [place] [address] [place], [place] [address]  I-stat chem 8, ED (not at [facility], [facility] or [facility])     Status: Abnormal   Collection Time: 02/06/22 11:43 AM  Result Value Ref Range   Sodium 138 135 - 145 mmol/L   Potassium 3.9 3.5 - 5.1 mmol/L   Chloride 102 98 - 111 mmol/L   BUN 23 8 - 23 mg/dL   Creatinine, Ser 1.70 (H) 0.61 - 1.24 mg/dL   Glucose, Bld 124 (H) 70 - 99 mg/dL    Comment: Glucose reference range applies only to samples taken after fasting for at least 8 hours.   Calcium, Ion 1.12 (L) 1.15 - 1.40 mmol/L   TCO2 24 22 - 32 mmol/L   Hemoglobin 14.3 13.0 - 17.0 g/dL   HCT 42.0 39.0 - 52.0 %    CT ABDOMEN PELVIS W CONTRAST  Result Date: 02/06/2022 CLINICAL DATA:  Hernia suspected, abdominal wall large, hardened skin with overlying warmth. Not reproducible. EXAM: CT ABDOMEN AND PELVIS WITH CONTRAST TECHNIQUE: Multidetector CT imaging of the abdomen and pelvis was performed using the standard protocol following bolus administration of intravenous contrast. RADIATION DOSE REDUCTION: This exam was performed according to the departmental dose-optimization program which includes automated exposure control, adjustment of the mA and/or kV according to patient size and/or use of iterative reconstruction technique. CONTRAST:  52mL OMNIPAQUE IOHEXOL 350 MG/ML SOLN COMPARISON:  07/31/2018 FINDINGS: Lower chest: Mild right basilar atelectasis. Moderate hiatal hernia.  Hepatobiliary: No focal liver abnormality is seen. Subtle hyperdensity in the gallbladder could reflect biliary sludge. No hyperdense gallstone. No evidence of wall thickening or pericholecystic inflammatory changes. No biliary dilatation. Pancreas: Unremarkable. No pancreatic ductal dilatation or surrounding inflammatory changes. Spleen: Normal in size without focal abnormality. Adrenals/Urinary Tract: Unremarkable adrenal glands. Multiple bilateral renal cysts including renal sinus cysts, not appreciably changed and do not require dedicated follow-up imaging. No renal stone or evidence of hydronephrosis. Urinary bladder within normal limits. Stomach/Bowel: Moderate-sized hiatal hernia. Stomach is otherwise within normal limits. Small-bowel obstruction secondary to a supraumbilical abdominal wall hernia containing small bowel which is abnormal in appearance. Bowel within the hernia sac is fluid-filled with mucosal hyperenhancement. Surrounding fat stranding. There is dilation of bowel upstream to the hernia. The small bowel distal to the hernia is decompressed. No focal colonic wall thickening or inflammatory changes. Vascular/Lymphatic: No significant vascular findings are present. No enlarged abdominal or pelvic lymph nodes. Reproductive: Prostate is unremarkable. Other: Prominent fat stranding within the subcutaneous soft tissues surrounding the supraumbilical hernia. No ascites. No pneumoperitoneum. Prior left inguinal hernia repair. Small fat containing right inguinal hernia. Musculoskeletal: No acute osseous abnormality. IMPRESSION: 1. Small-bowel obstruction secondary to a supraumbilical abdominal wall hernia containing small bowel. Distended small bowel within the hernia sac is fluid-filled with mucosal hyperenhancement and surrounding fat stranding. Findings are concerning for strangulation. Surgical consultation is recommended. 2. Moderate-sized hiatal hernia. 3. Subtle hyperdensity in the gallbladder  could reflect biliary sludge. No evidence of acute cholecystitis. 4. Small fat containing right inguinal hernia. These results were called by telephone at the time of interpretation on 02/06/2022 at 2:01 pm to provider Dr. Rondel Oh, who verbally acknowledged these results. Electronically Signed   By: Davina Poke D.O.   On: 02/06/2022 14:06    Review  of Systems  Constitutional:  Negative for chills and fever.  HENT:  Negative for ear discharge, hearing loss and sore throat.   Eyes:  Negative for discharge.  Respiratory:  Negative for cough and shortness of breath.   Cardiovascular:  Negative for chest pain and leg swelling.  Gastrointestinal:  Positive for nausea and vomiting. Negative for abdominal pain, constipation and diarrhea.  Musculoskeletal:  Negative for myalgias and neck pain.  Skin:  Negative for rash.  Allergic/Immunologic: Negative for environmental allergies.  Neurological:  Negative for dizziness and seizures.  Hematological:  Does not bruise/bleed easily.  Psychiatric/Behavioral:  Negative for suicidal ideas.   All other systems reviewed and are negative.  Blood pressure (!) 133/95, pulse 64, temperature 98.9 F (37.2 C), temperature source Oral, resp. rate 15, height 6' (1.829 m), weight 114.8 kg, SpO2 97 %. Physical Exam Constitutional:      Appearance: He is well-developed.     Comments: Conversant No acute distress  HENT:     Head: Normocephalic and atraumatic.  Eyes:     General: Lids are normal. No scleral icterus.    Pupils: Pupils are equal, round, and reactive to light.     Comments: Pupils are equal round and reactive No lid lag Moist conjunctiva  Neck:     Thyroid: No thyromegaly.     Trachea: No tracheal tenderness.     Comments: No cervical lymphadenopathy Cardiovascular:     Rate and Rhythm: Normal rate and regular rhythm.     Heart sounds: No murmur heard. Pulmonary:     Effort: Pulmonary effort is normal.     Breath sounds: Normal breath  sounds. No wheezing or rales.  Abdominal:     Tenderness: There is abdominal tenderness.     Hernia: A hernia is present. Hernia is present in the ventral area.       Comments: Area of hernia  Musculoskeletal:     Cervical back: Normal range of motion and neck supple.  Skin:    General: Skin is warm.     Findings: No rash.     Nails: There is no clubbing.     Comments: Normal skin turgor  Neurological:     Mental Status: He is alert and oriented to person, place, and time.     Comments: Normal gait and station  Psychiatric:        Mood and Affect: Mood normal.        Thought Content: Thought content normal.        Judgment: Judgment normal.     Comments: Appropriate affect     Assessment/Plan: 67 year old male with incisional hernia with incarceration. Morbid obesity A-fib on Eliquis CHF Hypertension  1.  The hernia was reduced at the bedside.  Patient would like to benefit from surgical repair as he has had this incidence of incarceration.  This would likely be best served as an outpatient as he is currently on Eliquis.  He would likely require cardiac evaluation prior to scheduling surgery. 2. Lactate normal.  Would trial PO and home with f/u in our clinic if [place] 02/06/2022, 3:26 PM

## 2022-02-06 NOTE — ED Triage Notes (Signed)
Reports sharp pain in lower abd that started yesterday at 4pm. Denies n/v/d.  LBM today which was normal.

## 2022-02-06 NOTE — ED Notes (Signed)
Pt ambulated to the restroom independently without difficulty.

## 2022-02-06 NOTE — Discharge Instructions (Addendum)
Your CT scan today showed a hernia that was swollen and stuck.  Fortunately, this was able to be put back in the place.  Your lab work is otherwise reassuring.  You should not lift, bend, push or pull until cleared by general surgery.  Call their office on Monday for an appointment.  You are leaving [place].  Please understand that we are open 365 days a year and 24 hours a day and we would be very happy to complete your workup and reassess you.  If you go home and have any worsening in condition including shaking chills worsening abdominal pain confusion, weakness, vomiting, uncontrolled fever you must return immediately to the emergency department.  Do not drive yourself call 119 and get here immediately.

## 2022-02-07 ENCOUNTER — Inpatient Hospital Stay (HOSPITAL_COMMUNITY): Payer: Medicare HMO | Admitting: Registered Nurse

## 2022-02-07 ENCOUNTER — Emergency Department (HOSPITAL_COMMUNITY): Payer: Medicare HMO

## 2022-02-07 ENCOUNTER — Inpatient Hospital Stay (HOSPITAL_COMMUNITY)
Admission: EM | Admit: 2022-02-07 | Discharge: 2022-02-12 | DRG: 329 | Disposition: A | Discharge status: Home / Self Care | Payer: Medicare HMO | Attending: Internal Medicine | Admitting: Internal Medicine

## 2022-02-07 ENCOUNTER — Other Ambulatory Visit: Payer: Self-pay

## 2022-02-07 ENCOUNTER — Encounter (HOSPITAL_COMMUNITY)
Admission: EM | Disposition: A | Discharge status: Home / Self Care | Payer: Self-pay | Source: Home / Self Care | Attending: Internal Medicine

## 2022-02-07 DIAGNOSIS — Z7951 Long term (current) use of inhaled steroids: Secondary | ICD-10-CM

## 2022-02-07 DIAGNOSIS — G4730 Sleep apnea, unspecified: Secondary | ICD-10-CM

## 2022-02-07 DIAGNOSIS — E785 Hyperlipidemia, unspecified: Secondary | ICD-10-CM | POA: Insufficient documentation

## 2022-02-07 DIAGNOSIS — J690 Pneumonitis due to inhalation of food and vomit: Secondary | ICD-10-CM | POA: Diagnosis not present

## 2022-02-07 DIAGNOSIS — K460 Unspecified abdominal hernia with obstruction, without gangrene: Secondary | ICD-10-CM | POA: Diagnosis not present

## 2022-02-07 DIAGNOSIS — G4733 Obstructive sleep apnea (adult) (pediatric): Secondary | ICD-10-CM | POA: Insufficient documentation

## 2022-02-07 DIAGNOSIS — R109 Unspecified abdominal pain: Secondary | ICD-10-CM

## 2022-02-07 DIAGNOSIS — I5041 Acute combined systolic (congestive) and diastolic (congestive) heart failure: Secondary | ICD-10-CM | POA: Diagnosis present

## 2022-02-07 DIAGNOSIS — J449 Chronic obstructive pulmonary disease, unspecified: Secondary | ICD-10-CM | POA: Diagnosis not present

## 2022-02-07 DIAGNOSIS — K56609 Unspecified intestinal obstruction, unspecified as to partial versus complete obstruction: Secondary | ICD-10-CM

## 2022-02-07 DIAGNOSIS — E0781 Sick-euthyroid syndrome: Secondary | ICD-10-CM | POA: Diagnosis present

## 2022-02-07 DIAGNOSIS — I10 Essential (primary) hypertension: Secondary | ICD-10-CM | POA: Insufficient documentation

## 2022-02-07 DIAGNOSIS — I4891 Unspecified atrial fibrillation: Secondary | ICD-10-CM | POA: Diagnosis not present

## 2022-02-07 DIAGNOSIS — I5042 Chronic combined systolic (congestive) and diastolic (congestive) heart failure: Secondary | ICD-10-CM | POA: Diagnosis not present

## 2022-02-07 DIAGNOSIS — K52832 Lymphocytic colitis: Secondary | ICD-10-CM | POA: Insufficient documentation

## 2022-02-07 DIAGNOSIS — N400 Enlarged prostate without lower urinary tract symptoms: Secondary | ICD-10-CM | POA: Diagnosis present

## 2022-02-07 DIAGNOSIS — E782 Mixed hyperlipidemia: Secondary | ICD-10-CM | POA: Diagnosis present

## 2022-02-07 DIAGNOSIS — K559 Vascular disorder of intestine, unspecified: Secondary | ICD-10-CM | POA: Diagnosis not present

## 2022-02-07 DIAGNOSIS — I5022 Chronic systolic (congestive) heart failure: Secondary | ICD-10-CM | POA: Insufficient documentation

## 2022-02-07 DIAGNOSIS — Z4682 Encounter for fitting and adjustment of non-vascular catheter: Secondary | ICD-10-CM | POA: Diagnosis not present

## 2022-02-07 DIAGNOSIS — I130 Hypertensive heart and chronic kidney disease with heart failure and stage 1 through stage 4 chronic kidney disease, or unspecified chronic kidney disease: Secondary | ICD-10-CM | POA: Diagnosis present

## 2022-02-07 DIAGNOSIS — J9811 Atelectasis: Secondary | ICD-10-CM | POA: Diagnosis not present

## 2022-02-07 DIAGNOSIS — I4819 Other persistent atrial fibrillation: Secondary | ICD-10-CM | POA: Diagnosis present

## 2022-02-07 DIAGNOSIS — Z886 Allergy status to analgesic agent status: Secondary | ICD-10-CM | POA: Diagnosis not present

## 2022-02-07 DIAGNOSIS — K430 Incisional hernia with obstruction, without gangrene: Secondary | ICD-10-CM | POA: Diagnosis not present

## 2022-02-07 DIAGNOSIS — Z79899 Other long term (current) drug therapy: Secondary | ICD-10-CM | POA: Diagnosis not present

## 2022-02-07 DIAGNOSIS — R918 Other nonspecific abnormal finding of lung field: Secondary | ICD-10-CM | POA: Diagnosis not present

## 2022-02-07 DIAGNOSIS — J45909 Unspecified asthma, uncomplicated: Secondary | ICD-10-CM | POA: Diagnosis not present

## 2022-02-07 DIAGNOSIS — R001 Bradycardia, unspecified: Secondary | ICD-10-CM | POA: Diagnosis present

## 2022-02-07 DIAGNOSIS — R0602 Shortness of breath: Secondary | ICD-10-CM | POA: Diagnosis not present

## 2022-02-07 DIAGNOSIS — D6869 Other thrombophilia: Secondary | ICD-10-CM | POA: Diagnosis present

## 2022-02-07 DIAGNOSIS — E860 Dehydration: Secondary | ICD-10-CM | POA: Diagnosis present

## 2022-02-07 DIAGNOSIS — K449 Diaphragmatic hernia without obstruction or gangrene: Secondary | ICD-10-CM | POA: Diagnosis present

## 2022-02-07 DIAGNOSIS — K4090 Unilateral inguinal hernia, without obstruction or gangrene, not specified as recurrent: Secondary | ICD-10-CM | POA: Diagnosis not present

## 2022-02-07 DIAGNOSIS — R1012 Left upper quadrant pain: Secondary | ICD-10-CM | POA: Diagnosis not present

## 2022-02-07 DIAGNOSIS — K420 Umbilical hernia with obstruction, without gangrene: Secondary | ICD-10-CM | POA: Diagnosis not present

## 2022-02-07 DIAGNOSIS — Z6834 Body mass index (BMI) 34.0-34.9, adult: Secondary | ICD-10-CM

## 2022-02-07 DIAGNOSIS — I480 Paroxysmal atrial fibrillation: Secondary | ICD-10-CM | POA: Diagnosis not present

## 2022-02-07 DIAGNOSIS — Z7901 Long term (current) use of anticoagulants: Secondary | ICD-10-CM | POA: Diagnosis not present

## 2022-02-07 DIAGNOSIS — D649 Anemia, unspecified: Secondary | ICD-10-CM | POA: Diagnosis not present

## 2022-02-07 DIAGNOSIS — K219 Gastro-esophageal reflux disease without esophagitis: Secondary | ICD-10-CM | POA: Diagnosis not present

## 2022-02-07 DIAGNOSIS — I700 Atherosclerosis of aorta: Secondary | ICD-10-CM | POA: Diagnosis not present

## 2022-02-07 DIAGNOSIS — Z888 Allergy status to other drugs, medicaments and biological substances status: Secondary | ICD-10-CM | POA: Diagnosis not present

## 2022-02-07 DIAGNOSIS — K436 Other and unspecified ventral hernia with obstruction, without gangrene: Secondary | ICD-10-CM

## 2022-02-07 DIAGNOSIS — N179 Acute kidney failure, unspecified: Secondary | ICD-10-CM

## 2022-02-07 DIAGNOSIS — Z8249 Family history of ischemic heart disease and other diseases of the circulatory system: Secondary | ICD-10-CM

## 2022-02-07 DIAGNOSIS — K529 Noninfective gastroenteritis and colitis, unspecified: Secondary | ICD-10-CM | POA: Diagnosis not present

## 2022-02-07 DIAGNOSIS — N281 Cyst of kidney, acquired: Secondary | ICD-10-CM | POA: Diagnosis not present

## 2022-02-07 DIAGNOSIS — K6389 Other specified diseases of intestine: Secondary | ICD-10-CM | POA: Diagnosis not present

## 2022-02-07 DIAGNOSIS — K55029 Acute infarction of small intestine, extent unspecified: Secondary | ICD-10-CM | POA: Diagnosis not present

## 2022-02-07 DIAGNOSIS — N1832 Chronic kidney disease, stage 3b: Secondary | ICD-10-CM | POA: Diagnosis not present

## 2022-02-07 DIAGNOSIS — Z4659 Encounter for fitting and adjustment of other gastrointestinal appliance and device: Secondary | ICD-10-CM | POA: Diagnosis not present

## 2022-02-07 HISTORY — PX: VENTRAL HERNIA REPAIR: SHX424

## 2022-02-07 HISTORY — PX: BOWEL RESECTION: SHX1257

## 2022-02-07 HISTORY — PX: LAPAROTOMY: SHX154

## 2022-02-07 LAB — CBC WITH DIFFERENTIAL/PLATELET
Abs Immature Granulocytes: 0.07 10*3/uL (ref 0.00–0.07)
Basophils Absolute: 0 10*3/uL (ref 0.0–0.1)
Basophils Relative: 0 %
Eosinophils Absolute: 0 10*3/uL (ref 0.0–0.5)
Eosinophils Relative: 0 %
HCT: 39.1 % (ref 39.0–52.0)
Hemoglobin: 12.5 g/dL — ABNORMAL LOW (ref 13.0–17.0)
Immature Granulocytes: 0 %
Lymphocytes Relative: 4 %
Lymphs Abs: 0.7 10*3/uL (ref 0.7–4.0)
MCH: 24.3 pg — ABNORMAL LOW (ref 26.0–34.0)
MCHC: 32 g/dL (ref 30.0–36.0)
MCV: 76.1 fL — ABNORMAL LOW (ref 80.0–100.0)
Monocytes Absolute: 1.1 10*3/uL — ABNORMAL HIGH (ref 0.1–1.0)
Monocytes Relative: 7 %
Neutro Abs: 15 10*3/uL — ABNORMAL HIGH (ref 1.7–7.7)
Neutrophils Relative %: 89 %
Platelets: 377 10*3/uL (ref 150–400)
RBC: 5.14 MIL/uL (ref 4.22–5.81)
RDW: 17.6 % — ABNORMAL HIGH (ref 11.5–15.5)
WBC: 16.9 10*3/uL — ABNORMAL HIGH (ref 4.0–10.5)
nRBC: 0 % (ref 0.0–0.2)

## 2022-02-07 LAB — COMPREHENSIVE METABOLIC PANEL
ALT: 11 U/L (ref 0–44)
AST: 16 U/L (ref 15–41)
Albumin: 3.4 g/dL — ABNORMAL LOW (ref 3.5–5.0)
Alkaline Phosphatase: 60 U/L (ref 38–126)
Anion gap: 11 (ref 5–15)
BUN: 42 mg/dL — ABNORMAL HIGH (ref 8–23)
CO2: 24 mmol/L (ref 22–32)
Calcium: 8.9 mg/dL (ref 8.9–10.3)
Chloride: 100 mmol/L (ref 98–111)
Creatinine, Ser: 3.52 mg/dL — ABNORMAL HIGH (ref 0.61–1.24)
GFR, Estimated: 18 mL/min — ABNORMAL LOW (ref >60)
Glucose, Bld: 127 mg/dL — ABNORMAL HIGH (ref 70–99)
Potassium: 3.9 mmol/L (ref 3.5–5.1)
Sodium: 135 mmol/L (ref 135–145)
Total Bilirubin: 1.6 mg/dL — ABNORMAL HIGH (ref 0.3–1.2)
Total Protein: 7.1 g/dL (ref 6.5–8.1)

## 2022-02-07 LAB — ABO/RH: ABO/RH(D): A POS

## 2022-02-07 LAB — LACTIC ACID, PLASMA
Lactic Acid, Venous: 1.4 mmol/L (ref 0.5–1.9)
Lactic Acid, Venous: 1.2 mmol/L (ref 0.5–1.9)

## 2022-02-07 SURGERY — LAPAROTOMY, EXPLORATORY
Anesthesia: General | Site: Abdomen

## 2022-02-07 MED ORDER — ONDANSETRON HCL 4 MG/2ML IJ SOLN
4.0000 mg | Freq: Once | INTRAMUSCULAR | Status: AC
  Administered 2022-02-07: 4 mg via INTRAVENOUS
  Filled 2022-02-07: qty 2

## 2022-02-07 MED ORDER — SODIUM CHLORIDE 0.9 % IV SOLN: INTRAVENOUS | Status: DC

## 2022-02-07 MED ORDER — HYDROMORPHONE HCL 1 MG/ML IJ SOLN
1.0000 mg | Freq: Once | INTRAMUSCULAR | Status: AC
  Administered 2022-02-07: 1 mg via INTRAVENOUS
  Filled 2022-02-07: qty 1

## 2022-02-07 MED ORDER — LACTATED RINGERS IV BOLUS
1000.0000 mL | Freq: Once | INTRAVENOUS | Status: AC
  Administered 2022-02-07: 1000 mL via INTRAVENOUS

## 2022-02-07 MED ORDER — FENTANYL CITRATE (PF) 250 MCG/5ML IJ SOLN
INTRAMUSCULAR | Status: AC
  Filled 2022-02-07: qty 5

## 2022-02-07 MED ORDER — ONDANSETRON HCL 4 MG/2ML IJ SOLN: 4.0000 mg | Freq: Four times a day (QID) | INTRAMUSCULAR | Status: DC | PRN

## 2022-02-07 MED ORDER — ACETAMINOPHEN 650 MG RE SUPP: 650.0000 mg | Freq: Four times a day (QID) | RECTAL | Status: DC | PRN

## 2022-02-07 MED ORDER — AMIODARONE HCL IN DEXTROSE 360-4.14 MG/200ML-% IV SOLN
60.0000 mg/h | INTRAVENOUS | Status: AC
  Filled 2022-02-07: qty 200

## 2022-02-07 MED ORDER — FENTANYL CITRATE PF 50 MCG/ML IJ SOSY: 50.0000 ug | PREFILLED_SYRINGE | Freq: Once | INTRAMUSCULAR | Status: DC | PRN

## 2022-02-07 MED ORDER — VANCOMYCIN HCL 1500 MG/300ML IV SOLN
1500.0000 mg | Freq: Once | INTRAVENOUS | Status: AC
  Administered 2022-02-07: 1500 mg via INTRAVENOUS
  Filled 2022-02-07 (×2): qty 300

## 2022-02-07 MED ORDER — EPHEDRINE SULFATE-NACL 50-0.9 MG/10ML-% IV SOSY
PREFILLED_SYRINGE | INTRAVENOUS | Status: DC | PRN
  Administered 2022-02-07 – 2022-02-08 (×2): 5 mg via INTRAVENOUS

## 2022-02-07 MED ORDER — ACETAMINOPHEN 325 MG PO TABS: 650.0000 mg | ORAL_TABLET | Freq: Four times a day (QID) | ORAL | Status: DC | PRN

## 2022-02-07 MED ORDER — MIDAZOLAM HCL 2 MG/2ML IJ SOLN
INTRAMUSCULAR | Status: AC
  Filled 2022-02-07: qty 2

## 2022-02-07 MED ORDER — LACTATED RINGERS IV BOLUS: 30.0000 mL/kg | Freq: Once | INTRAVENOUS | Status: DC

## 2022-02-07 MED ORDER — PIPERACILLIN-TAZOBACTAM 3.375 G IVPB
3.3750 g | Freq: Three times a day (TID) | INTRAVENOUS | Status: DC
  Administered 2022-02-08 – 2022-02-11 (×10): 3.375 g via INTRAVENOUS
  Filled 2022-02-07 (×10): qty 50

## 2022-02-07 MED ORDER — PROPOFOL 10 MG/ML IV BOLUS
INTRAVENOUS | Status: AC
  Filled 2022-02-07: qty 20

## 2022-02-07 MED ORDER — MORPHINE SULFATE (PF) 2 MG/ML IV SOLN
2.0000 mg | INTRAVENOUS | Status: DC | PRN
  Administered 2022-02-08: 2 mg via INTRAVENOUS

## 2022-02-07 MED ORDER — AMIODARONE HCL IN DEXTROSE 360-4.14 MG/200ML-% IV SOLN: 30.0000 mg/h | INTRAVENOUS | Status: DC

## 2022-02-07 MED ORDER — HEPARIN SODIUM (PORCINE) 5000 UNIT/ML IJ SOLN
5000.0000 [IU] | Freq: Three times a day (TID) | INTRAMUSCULAR | Status: DC
  Administered 2022-02-08 – 2022-02-09 (×3): 5000 [IU] via SUBCUTANEOUS
  Filled 2022-02-07 (×3): qty 1

## 2022-02-07 MED ORDER — PANTOPRAZOLE SODIUM 40 MG IV SOLR
40.0000 mg | Freq: Every day | INTRAVENOUS | Status: DC
  Administered 2022-02-08 – 2022-02-12 (×5): 40 mg via INTRAVENOUS
  Filled 2022-02-07 (×5): qty 10

## 2022-02-07 MED ORDER — PIPERACILLIN-TAZOBACTAM 3.375 G IVPB 30 MIN
3.3750 g | Freq: Once | INTRAVENOUS | Status: AC
  Administered 2022-02-07: 3.375 g via INTRAVENOUS
  Filled 2022-02-07: qty 50

## 2022-02-07 MED ORDER — LACTATED RINGERS IV SOLN: INTRAVENOUS | Status: DC | PRN

## 2022-02-07 SURGICAL SUPPLY — 51 items
APL PRP STRL LF DISP 70% ISPRP (MISCELLANEOUS) ×2
BAG COUNTER SPONGE SURGICOUNT (BAG) ×2 IMPLANT
BAG SPNG CNTER NS LX DISP (BAG) ×2
BLADE CLIPPER SURG (BLADE) IMPLANT
CANISTER SUCT 3000ML PPV (MISCELLANEOUS) ×2 IMPLANT
CHLORAPREP W/TINT 26 (MISCELLANEOUS) ×2 IMPLANT
COVER SURGICAL LIGHT HANDLE (MISCELLANEOUS) ×2 IMPLANT
DRAPE LAPAROSCOPIC ABDOMINAL (DRAPES) ×2 IMPLANT
DRAPE WARM FLUID 44X44 (DRAPES) ×2 IMPLANT
DRSG OPSITE POSTOP 4X10 (GAUZE/BANDAGES/DRESSINGS) IMPLANT
DRSG OPSITE POSTOP 4X8 (GAUZE/BANDAGES/DRESSINGS) IMPLANT
ELECT BLADE 6.5 EXT (BLADE) IMPLANT
ELECT CAUTERY BLADE 6.4 (BLADE) ×2 IMPLANT
ELECT REM PT RETURN 9FT ADLT (ELECTROSURGICAL) ×2
ELECTRODE REM PT RTRN 9FT ADLT (ELECTROSURGICAL) ×2 IMPLANT
GLOVE BIO SURGEON STRL SZ 6 (GLOVE) ×2 IMPLANT
GLOVE INDICATOR 6.5 STRL GRN (GLOVE) ×2 IMPLANT
GOWN STRL REUS W/ TWL LRG LVL3 (GOWN DISPOSABLE) ×4 IMPLANT
GOWN STRL REUS W/TWL LRG LVL3 (GOWN DISPOSABLE) ×4
HANDLE SUCTION POOLE (INSTRUMENTS) ×2 IMPLANT
KIT BASIN OR (CUSTOM PROCEDURE TRAY) ×2 IMPLANT
KIT TURNOVER KIT B (KITS) ×2 IMPLANT
LIGASURE IMPACT 36 18CM CVD LR (INSTRUMENTS) IMPLANT
NDL HYPO 25GX1X1/2 BEV (NEEDLE) IMPLANT
NEEDLE HYPO 25GX1X1/2 BEV (NEEDLE) IMPLANT
NS IRRIG 1000ML POUR BTL (IV SOLUTION) ×4 IMPLANT
PACK GENERAL/GYN (CUSTOM PROCEDURE TRAY) ×2 IMPLANT
PAD ARMBOARD 7.5X6 YLW CONV (MISCELLANEOUS) ×2 IMPLANT
PENCIL SMOKE EVACUATOR (MISCELLANEOUS) ×2 IMPLANT
RELOAD PROXIMATE 75MM BLUE (ENDOMECHANICALS) ×8 IMPLANT
RELOAD STAPLE 75 3.8 BLU REG (ENDOMECHANICALS) IMPLANT
SPECIMEN JAR LARGE (MISCELLANEOUS) IMPLANT
SPONGE T-LAP 18X18 ~~LOC~~+RFID (SPONGE) IMPLANT
STAPLER PROXIMATE 75MM BLUE (STAPLE) IMPLANT
STAPLER VISISTAT 35W (STAPLE) ×2 IMPLANT
SUCTION POOLE HANDLE (INSTRUMENTS) ×2
SUT NOVA NAB DX-16 0-1 5-0 T12 (SUTURE) IMPLANT
SUT PDS AB 1 TP1 96 (SUTURE) ×4 IMPLANT
SUT SILK 2 0 SH CR/8 (SUTURE) ×2 IMPLANT
SUT SILK 2 0 TIES 10X30 (SUTURE) ×2 IMPLANT
SUT SILK 3 0 SH CR/8 (SUTURE) ×2 IMPLANT
SUT SILK 3 0 TIES 10X30 (SUTURE) ×2 IMPLANT
SUT VIC AB 3-0 SH 18 (SUTURE) IMPLANT
SUT VIC AB 3-0 SH 27 (SUTURE) ×2
SUT VIC AB 3-0 SH 27XBRD (SUTURE) IMPLANT
SYR CONTROL 10ML LL (SYRINGE) IMPLANT
TOWEL GREEN STERILE (TOWEL DISPOSABLE) ×2 IMPLANT
TOWEL GREEN STERILE FF (TOWEL DISPOSABLE) ×2 IMPLANT
TRAY FOLEY MTR SLVR 16FR STAT (SET/KITS/TRAYS/PACK) IMPLANT
TRAY FOLEY W/BAG SLVR 16FR (SET/KITS/TRAYS/PACK)
TRAY FOLEY W/BAG SLVR 16FR ST (SET/KITS/TRAYS/PACK) IMPLANT

## 2022-02-07 NOTE — ED Provider Triage Note (Signed)
Emergency Medicine Provider Triage Evaluation Note  [name] , a 67 y.o. male  was evaluated in triage.  Pt complains of abdominal pain, nausea, vomiting.  Evaluated yesterday for distended bowel into his hernia.  Left AMA according to records.  Returns today he has not improved.  Looks that he was sent home with antibiotics, has taken 1 dose this morning without any improvement in symptoms.  No fevers at home.  Review of Systems  Positive: Abdominal pain, nausea, vomiting Negative: Fever, diarrhea  Physical Exam  There were no vitals taken for this visit. Gen:   Awake, no distress   Resp:  Normal effort  MSK:   Moves extremities without difficulty  Other:  Bowel sounds are absent, abdomen is very distended, tenderness along the umbilical area with erythema and warmth noted.  Medical Decision Making  Medically screening exam initiated at 5:28 PM.  Appropriate orders placed.  [name] was informed that the remainder of the evaluation will be completed by another provider, this initial triage assessment does not replace that evaluation, and the importance of remaining in the ED until their evaluation is complete.     Janeece Fitting, PA-C 02/07/22 1732

## 2022-02-07 NOTE — Progress Notes (Addendum)
Subjective/Chief Complaint: Patient is a 67 year old male, with history of CHF, A-fib-on eliquis (last dose this morning), hypertension, morbid obesity.  Patient returns secondary to incarcerated incisional hernia.  Patient states he had robotic prostatectomy and nephrolithiasis surgery several years ago.  Patient has supraumbilical midline incision.  He states that 2 days ago after chopping some wood and dragging limbs he felt some soreness in his upper midline.  He states that this began to cause continued pain.  He had some nausea vomiting overnight.  He states that the pain continued so he presented to the ER yesterday.   Upon evaluation the ER underwent CT scan and laboratory studies.  CT scan was significant for incarcerated incisional hernia with SBO at the hernia site.  Patient also with leukocytosis.  The hernia was reduced at the bedside by Dr. Rosendo Gros.  The patient apparently left AMA following this despite having spiked a fever.  He reports that since going home he has not really been able to keep anything down.  He tried to eat lunch today and it all came back up.  No bowel movement or flatus for the last couple of days.  Labwork today is notable for acute kidney injury with a creatinine of 3.5 from 1.7 yesterday (of note he did get IV contrast yesterday.  No acidosis, lactate is normal.  White blood count is unchanged from yesterday.  He is afebrile with no tachycardia.  Objective: Vital signs in last 24 hours: Temp:  [97.9 F (36.6 C)-98.2 F (36.8 C)] 97.9 F (36.6 C) (01/14 2128) Pulse Rate:  [65-88] 69 (01/14 2115) Resp:  [17-18] 18 (01/14 2115) BP: (90-122)/(61-98) 122/75 (01/14 2115) SpO2:  [88 %-100 %] 100 % (01/14 2115) Weight:  [114.8 kg] 114.8 kg (01/14 1730)    Intake/Output from previous day: No intake/output data recorded. Intake/Output this shift: No intake/output data recorded.  Alert, calm, cooperative Bilateral wheezing, unlabored respirations Abdomen  is obese, distended, nontender.  Firm mass in the supraumbilical region which is only partially reducible but minimally tender.  Lab Results:  Recent Labs    02/06/22 1129 02/06/22 1143 02/07/22 1728  WBC 17.0*  --  16.9*  HGB 12.8* 14.3 12.5*  HCT 39.7 42.0 39.1  PLT 390  --  377   BMET Recent Labs    02/06/22 1129 02/06/22 1143 02/07/22 1728  NA 136 138 135  K 3.8 3.9 3.9  CL 101 102 100  CO2 24  --  24  GLUCOSE 121* 124* 127*  BUN 20 23 42*  CREATININE 1.66* 1.70* 3.52*  CALCIUM 9.2  --  8.9   PT/INR No results for input(s): "LABPROT", "INR" in the last 72 hours. ABG No results for input(s): "PHART", "HCO3" in the last 72 hours.  Invalid input(s): "PCO2", "PO2"  Studies/Results: CT ABDOMEN PELVIS W CONTRAST  Result Date: 02/06/2022 CLINICAL DATA:  Hernia suspected, abdominal wall large, hardened skin with overlying warmth. Not reproducible. EXAM: CT ABDOMEN AND PELVIS WITH CONTRAST TECHNIQUE: Multidetector CT imaging of the abdomen and pelvis was performed using the standard protocol following bolus administration of intravenous contrast. RADIATION DOSE REDUCTION: This exam was performed according to the departmental dose-optimization program which includes automated exposure control, adjustment of the mA and/or kV according to patient size and/or use of iterative reconstruction technique. CONTRAST:  26mL OMNIPAQUE IOHEXOL 350 MG/ML SOLN COMPARISON:  07/31/2018 FINDINGS: Lower chest: Mild right basilar atelectasis. Moderate hiatal hernia. Hepatobiliary: No focal liver abnormality is seen. Subtle hyperdensity in the gallbladder could  reflect biliary sludge. No hyperdense gallstone. No evidence of wall thickening or pericholecystic inflammatory changes. No biliary dilatation. Pancreas: Unremarkable. No pancreatic ductal dilatation or surrounding inflammatory changes. Spleen: Normal in size without focal abnormality. Adrenals/Urinary Tract: Unremarkable adrenal glands. Multiple  bilateral renal cysts including renal sinus cysts, not appreciably changed and do not require dedicated follow-up imaging. No renal stone or evidence of hydronephrosis. Urinary bladder within normal limits. Stomach/Bowel: Moderate-sized hiatal hernia. Stomach is otherwise within normal limits. Small-bowel obstruction secondary to a supraumbilical abdominal wall hernia containing small bowel which is abnormal in appearance. Bowel within the hernia sac is fluid-filled with mucosal hyperenhancement. Surrounding fat stranding. There is dilation of bowel upstream to the hernia. The small bowel distal to the hernia is decompressed. No focal colonic wall thickening or inflammatory changes. Vascular/Lymphatic: No significant vascular findings are present. No enlarged abdominal or pelvic lymph nodes. Reproductive: Prostate is unremarkable. Other: Prominent fat stranding within the subcutaneous soft tissues surrounding the supraumbilical hernia. No ascites. No pneumoperitoneum. Prior left inguinal hernia repair. Small fat containing right inguinal hernia. Musculoskeletal: No acute osseous abnormality. IMPRESSION: 1. Small-bowel obstruction secondary to a supraumbilical abdominal wall hernia containing small bowel. Distended small bowel within the hernia sac is fluid-filled with mucosal hyperenhancement and surrounding fat stranding. Findings are concerning for strangulation. Surgical consultation is recommended. 2. Moderate-sized hiatal hernia. 3. Subtle hyperdensity in the gallbladder could reflect biliary sludge. No evidence of acute cholecystitis. 4. Small fat containing right inguinal hernia. These results were called by telephone at the time of interpretation on 02/06/2022 at 2:01 pm to provider Dr. Rondel Oh, who verbally acknowledged these results. Electronically Signed   By: Davina Poke D.O.   On: 02/06/2022 14:06    Anti-infectives: Anti-infectives (From admission, onward)    Start     Dose/Rate Route Frequency  Ordered Stop   02/08/22 0200  piperacillin-tazobactam (ZOSYN) IVPB 3.375 g        3.375 g 12.5 mL/hr over 240 Minutes Intravenous Every 8 hours 02/07/22 1933     02/07/22 1945  piperacillin-tazobactam (ZOSYN) IVPB 3.375 g        3.375 g 100 mL/hr over 30 Minutes Intravenous  Once 02/07/22 1931 02/07/22 2044       Assessment/Plan:  67 year old male with incisional hernia with incarceration. Morbid obesity A-fib on Eliquis CHF Hypertension Acute kidney injury on CKD (1/14) Aspiration pneumonia (1/14)   1.  Hernia seems to remain at least partially incarcerated this evening.  He has ongoing obstructive symptoms and now with acute kidney injury likely due to combination of dehydration from ongoing poor p.o. intake/emesis and recent IV contrast load.  No fever, no acidosis, but does have a persistent leukocytosis.  His abdominal exam is actually quite benign.   I recommend hospitalist admission for fluid resuscitation/optimize medical comorbidities, NG tube placement for decompression, and repeat CT scan.  May require surgery this evening depending on results.  He did take his Eliquis this morning.  Addended 10:15pm-I reviewed his CT images.  He has a persistent knuckle of small bowel within the hernia.  Obstruction appears worse than yesterday CT.  I recommend proceeding to the operating room emergently for repair of the hernia and possible small bowel resection.  I discussed the procedure with the patient and we went over risks of bleeding, infection, pain, scarring, injury to intra-abdominal structures, ileus, anastomotic leak or bleed if bowel resection is needed, wound healing problems, hernia recurrence, as well as cardiovascular/pulmonary/thromboembolic risks.  Questions welcomed and answered.  He  is agreeable to proceed.     LOS: 0 days    Clovis Riley 02/07/2022

## 2022-02-07 NOTE — ED Provider Notes (Signed)
[facility] EMERGENCY DEPARTMENT Provider Note   CSN: [id number] Arrival date & time: 02/07/22  1721     History  Chief Complaint  Patient presents with  . Abdominal Pain    [name] is a 67 y.o. male.  With PMH of A-fib on anticoagulation who was seen yesterday in the ER on 02/06/2022 with abdominal pain found to have incarcerated and strangulated periumbilical hernia which was reduced by general surgery Dr Rosendo Gros.  Patient noted to have a fever yesterday and wanted to be reevaluated by ED provider however he left [facility].  He had been feeling better until he went home today.  He tried eating at lunch and proceeded to have immediate nonbloody nonbilious emesis.  He has not had any bowel movements for the past 2 days and is not passing any gas.  He has been having severely worsening pain around his umbilicus.  He has had no fevers that he is aware of today.  He denies any urinary symptoms and is still been urinating.  His last dose of Eliquis was this morning.  His previous surgical history includes operations for kidney stones.  No chest pain or shortness of breath.   Abdominal Pain      Home Medications Prior to Admission medications   Medication Sig Start Date End Date Taking? Authorizing Provider  acetaminophen (TYLENOL) 325 MG tablet Take 2 tablets (650 mg total) by mouth every 6 (six) hours as needed for mild pain or headache. 04/27/16   Shirley Friar, PA-C  albuterol (PROVENTIL HFA;VENTOLIN HFA) 108 (90 Base) MCG/ACT inhaler Inhale 2 puffs into the lungs every 6 (six) hours as needed for wheezing or shortness of breath.    [provider]  amiodarone (PACERONE) 100 MG tablet Take 1 tablet (100 mg total) by mouth daily. May take an extra tablet daily for breakthrough afib as directed 01/27/22   Sherran Needs, NP  amLODipine (NORVASC) 5 MG tablet TAKE 1 TABLET (5 MG TOTAL) BY MOUTH DAILY. 12/11/21   Bensimhon, Shaune Pascal, MD   amoxicillin-clavulanate (AUGMENTIN) 875-125 MG tablet Take 1 tablet by mouth every 12 (twelve) hours. 02/06/22   Margarita Mail, PA-C  apixaban (ELIQUIS) 5 MG TABS tablet Take 1 tablet (5 mg total) by mouth 2 (two) times daily. 05/28/21   Bensimhon, Shaune Pascal, MD  docusate sodium (COLACE) 100 MG capsule Take 1 capsule (100 mg total) by mouth every 12 (twelve) hours. 02/06/22   Carlisle Cater, PA-C  ENTRESTO 97-103 MG TAKE 1 TABLET BY MOUTH TWICE A DAY 08/24/21   Bensimhon, Shaune Pascal, MD  eplerenone (INSPRA) 25 MG tablet TAKE 1/2 TABLET BY MOUTH EVERY DAY 11/03/21   Bensimhon, Shaune Pascal, MD  Fluticasone-Salmeterol (ADVAIR) 250-50 MCG/DOSE AEPB Inhale 1 puff into the lungs 2 (two) times daily.     [provider]  furosemide (LASIX) 40 MG tablet Take one tablet by mouth on Mondays and Fridays 08/29/19   Bensimhon, Shaune Pascal, MD  ondansetron (ZOFRAN-ODT) 4 MG disintegrating tablet Take 1 tablet (4 mg total) by mouth every 8 (eight) hours as needed for nausea or vomiting. 02/06/22   Carlisle Cater, PA-C  oxyCODONE-acetaminophen (PERCOCET/ROXICET) 5-325 MG tablet Take 1 tablet by mouth every 6 (six) hours as needed for severe pain. 02/06/22   Carlisle Cater, PA-C  pantoprazole (PROTONIX) 40 MG tablet Take 40 mg by mouth daily.    [provider]  tadalafil (CIALIS) 5 MG tablet Take 5 mg by mouth daily.  [provider]      Allergies    Ibuprofen, Isosorbide, Lisinopril, Digoxin and related, and Spironolactone    Review of Systems   Review of Systems  Gastrointestinal:  Positive for abdominal pain.    Physical Exam Updated Vital Signs BP (!) 118/98 (BP Location: Right Arm)   Pulse 88   Temp 98.2 F (36.8 C)   Resp 18   Ht 6' (1.829 m)   Wt 114.8 kg   SpO2 95%   BMI 34.31 kg/m  Physical Exam Constitutional: Alert and oriented.  Ill-appearing but no acute distress Eyes: Conjunctivae are normal. ENT      Neck: No stridor. Cardiovascular: S1, S2, regular rate, warm  well-perfused Respiratory: Normal respiratory effort. Breath sounds are normal.  O2 sat 90-94 on RA Gastrointestinal: Distended and soft with a localized erythema tenderness and tense region to the superior and right lateral region of umbilichus see media Musculoskeletal: Normal range of motion in all extremities. No pitting edema lower extremities Neurologic: Normal speech and language. No gross focal neurologic deficits are appreciated. Skin: Skin is warm, dry and intact. No rash noted. Psychiatric: Mood and affect are normal. Speech and behavior are normal.   ED Results / Procedures / Treatments   Labs (all labs ordered are listed, but only abnormal results are displayed) Labs Reviewed  CBC WITH DIFFERENTIAL/PLATELET - Abnormal; Notable for the following components:      Result Value   WBC 16.9 (*)    Hemoglobin 12.5 (*)    MCV 76.1 (*)    MCH 24.3 (*)    RDW 17.6 (*)    Neutro Abs 15.0 (*)    Monocytes Absolute 1.1 (*)    All other components within normal limits  COMPREHENSIVE METABOLIC PANEL  LACTIC ACID, PLASMA  LACTIC ACID, PLASMA    EKG None  Radiology CT ABDOMEN PELVIS W CONTRAST  Result Date: 02/06/2022 CLINICAL DATA:  Hernia suspected, abdominal wall large, hardened skin with overlying warmth. Not reproducible. EXAM: CT ABDOMEN AND PELVIS WITH CONTRAST TECHNIQUE: Multidetector CT imaging of the abdomen and pelvis was performed using the standard protocol following bolus administration of intravenous contrast. RADIATION DOSE REDUCTION: This exam was performed according to the departmental dose-optimization program which includes automated exposure control, adjustment of the mA and/or kV according to patient size and/or use of iterative reconstruction technique. CONTRAST:  39mL OMNIPAQUE IOHEXOL 350 MG/ML SOLN COMPARISON:  07/31/2018 FINDINGS: Lower chest: Mild right basilar atelectasis. Moderate hiatal hernia. Hepatobiliary: No focal liver abnormality is seen. Subtle  hyperdensity in the gallbladder could reflect biliary sludge. No hyperdense gallstone. No evidence of wall thickening or pericholecystic inflammatory changes. No biliary dilatation. Pancreas: Unremarkable. No pancreatic ductal dilatation or surrounding inflammatory changes. Spleen: Normal in size without focal abnormality. Adrenals/Urinary Tract: Unremarkable adrenal glands. Multiple bilateral renal cysts including renal sinus cysts, not appreciably changed and do not require dedicated follow-up imaging. No renal stone or evidence of hydronephrosis. Urinary bladder within normal limits. Stomach/Bowel: Moderate-sized hiatal hernia. Stomach is otherwise within normal limits. Small-bowel obstruction secondary to a supraumbilical abdominal wall hernia containing small bowel which is abnormal in appearance. Bowel within the hernia sac is fluid-filled with mucosal hyperenhancement. Surrounding fat stranding. There is dilation of bowel upstream to the hernia. The small bowel distal to the hernia is decompressed. No focal colonic wall thickening or inflammatory changes. Vascular/Lymphatic: No significant vascular findings are present. No enlarged abdominal or pelvic lymph nodes. Reproductive: Prostate is unremarkable. Other: Prominent fat stranding within the subcutaneous  soft tissues surrounding the supraumbilical hernia. No ascites. No pneumoperitoneum. Prior left inguinal hernia repair. Small fat containing right inguinal hernia. Musculoskeletal: No acute osseous abnormality. IMPRESSION: 1. Small-bowel obstruction secondary to a supraumbilical abdominal wall hernia containing small bowel. Distended small bowel within the hernia sac is fluid-filled with mucosal hyperenhancement and surrounding fat stranding. Findings are concerning for strangulation. Surgical consultation is recommended. 2. Moderate-sized hiatal hernia. 3. Subtle hyperdensity in the gallbladder could reflect biliary sludge. No evidence of acute  cholecystitis. 4. Small fat containing right inguinal hernia. These results were called by telephone at the time of interpretation on 02/06/2022 at 2:01 pm to provider Dr. Rondel Oh, who verbally acknowledged these results. Electronically Signed   By: Davina Poke D.O.   On: 02/06/2022 14:06    Procedures Procedures  {Document cardiac monitor, telemetry assessment procedure when appropriate:1}  Medications Ordered in ED Medications  ondansetron (ZOFRAN) injection 4 mg (4 mg Intravenous Given 02/07/22 1739)    ED Course/ Medical Decision Making/ A&P Clinical Course as of 02/07/22 2014  Sun Feb 07, 2022  2001 Spoke with Dr. Windle Guard who is unable to evaluate patient at this time due to actively being in surgery.  She recommends hospitalist admission for continued management.  Will keep patient NPO. [VB]    Clinical Course User Index [VB] Elgie Congo, MD   {   Click here for ABCD2, HEART and other calculatorsREFRESH Note before signing :1}                          Medical Decision Making Risk Prescription drug management.   ***  {Document critical care time when appropriate:1} {Document review of labs and clinical decision tools ie heart score, Chads2Vasc2 etc:1}  {Document your independent review of radiology images, and any outside records:1} {Document your discussion with family members, caretakers, and with consultants:1} {Document social determinants of health affecting pt's care:1} {Document your decision making why or why not admission, treatments were needed:1} Final Clinical Impression(s) / ED Diagnoses Final diagnoses:  None    Rx / DC Orders ED Discharge Orders     None

## 2022-02-07 NOTE — Progress Notes (Signed)
Pharmacy Antibiotic Note  [name] is a 67 y.o. male admitted on 02/07/2022 presenting with abdominal pain, incarcerated hernia.  Pharmacy has been consulted for zosyn dosing.  Plan: Zosyn 3.375g IV every 8 hours (extended 4h infusion) Monitor renal function, clinical progression and surg recs   Height: 6' (182.9 cm) Weight: 114.8 kg (253 lb) IBW/kg (Calculated) : 77.6  Temp (24hrs), Avg:98.2 F (36.8 C), Min:98.2 F (36.8 C), Max:98.2 F (36.8 C)  Recent Labs  Lab 02/06/22 1129 02/06/22 1143 02/06/22 1351 02/07/22 1728  WBC 17.0*  --   --  16.9*  CREATININE 1.66* 1.70*  --  3.52*  LATICACIDVEN  --   --  1.5  --     Estimated Creatinine Clearance: 27 mL/min (A) (by C-G formula based on SCr of 3.52 mg/dL (H)).    Allergies  Allergen Reactions   Ibuprofen Other (See Comments)    Has a-Fib, unable to take   Isosorbide Other (See Comments)    HEADACHES from [employer]   Lisinopril Cough   Digoxin And Related Rash   Spironolactone Rash    Bertis Ruddy, PharmD, [place] Pharmacist ED Pharmacist Phone # [phone] 02/07/2022 7:32 PM

## 2022-02-07 NOTE — H&P (Signed)
PCP:   [name], MD   Chief Complaint:  No pain.  HPI: This is a 67 year old male with past medical history of atrial fibrillation, CHF, hypertension, lymphocytic colitis, hyperlipidemia, BPH, sleep apnea.  Said that on 4 PM he developed sudden onset of pain abdominal pain over the incision site where they went to remove kidney stones and per patient where they did work for his robotic assisted prostatectomy.  He had nausea and vomiting all night.  No hematemesis.  His pain was constant, on Sunday came to the ER.  In the ER CT abdomen pelvis shows strangulated abdominal wall hernia.  Patient being taken to the OR.  Dr. Megan Salon aware.  Review of Systems:  The patient denies anorexia, fever, weight loss,, vision loss, decreased hearing, hoarseness, chest pain, syncope, dyspnea on exertion, peripheral edema, balance deficits, hemoptysis, abdominal pain, melena, hematochezia, severe indigestion/heartburn, hematuria, incontinence, genital sores, muscle weakness, suspicious skin lesions, transient blindness, difficulty walking, depression, unusual weight change, abnormal bleeding, enlarged lymph nodes, angioedema, and breast masses.  Past Medical History: Past Medical History:  Diagnosis Date   Asthma, persistent    BPH (benign prostatic hyperplasia) 2014   CHF (congestive heart failure) (HCC)    Combined hyperlipidemia    Dysrhythmia    ATRIAL FIBRILLATION   GERD (gastroesophageal reflux disease)    History of kidney stones    Hypertension    Lymphocytic colitis 07/2011   MICROSCOPIC   Obesity    Persistent atrial fibrillation ([place])    Renal stone 04/2012   Seasonal allergic rhinitis    Sleep apnea    does not use a cpap machine   Past Surgical History:  Procedure Laterality Date   ATRIAL FIBRILLATION ABLATION N/A 03/16/2019   Procedure: ATRIAL FIBRILLATION ABLATION;  Surgeon: Thompson Grayer, MD;  Location: [facility] [place] [facility];  Service: Cardiovascular;  Laterality: N/A;    CARDIOVERSION N/A 04/23/2016   Procedure: CARDIOVERSION;  Surgeon: Jolaine Artist, MD;  Location: [facility] ENDOSCOPY;  Service: Cardiovascular;  Laterality: N/A;   CARDIOVERSION N/A 01/12/2022   Procedure: CARDIOVERSION;  Surgeon: Buford Dresser, MD;  Location: [facility] ENDOSCOPY;  Service: Cardiovascular;  Laterality: N/A;   CYSTOSCOPY WITH RETROGRADE PYELOGRAM, URETEROSCOPY AND STENT PLACEMENT Left 07/29/2017   Procedure: CYSTOSCOPY WITH RETROGRADE PYELOGRAM AND LEFT STENT PLACEMENT;  Surgeon: Franchot Gallo, MD;  Location: [facility];  Service: Urology;  Laterality: Left;   EXTRACORPOREAL SHOCK WAVE LITHOTRIPSY Left 08/08/2017   Procedure: LEFT EXTRACORPOREAL SHOCK WAVE LITHOTRIPSY (ESWL);  Surgeon: Nickie Retort, MD;  Location: [facility];  Service: Urology;  Laterality: Left;   TEE WITHOUT CARDIOVERSION N/A 04/23/2016   Procedure: TRANSESOPHAGEAL ECHOCARDIOGRAM (TEE);  Surgeon: Jolaine Artist, MD;  Location: [facility] ENDOSCOPY;  Service: Cardiovascular;  Laterality: N/A;   XI ROBOTIC ASSISTED SIMPLE PROSTATECTOMY N/A 12/25/2018   Procedure: XI ROBOTIC ASSISTED SIMPLE PROSTATECTOMY, REMOVAL OF BLADDER STONES;  Surgeon: Cleon Gustin, MD;  Location: [facility];  Service: Urology;  Laterality: N/A;    Medications: Prior to Admission medications   Medication Sig Start Date End Date Taking? Authorizing Provider  acetaminophen (TYLENOL) 325 MG tablet Take 2 tablets (650 mg total) by mouth every 6 (six) hours as needed for mild pain or headache. 04/27/16   Shirley Friar, PA-C  albuterol (PROVENTIL HFA;VENTOLIN HFA) 108 (90 Base) MCG/ACT inhaler Inhale 2 puffs into the lungs every 6 (six) hours as needed for wheezing or shortness of breath.    [provider]  amiodarone (PACERONE) 100 MG tablet Take 1  tablet (100 mg total) by mouth daily. May take an extra tablet daily for breakthrough afib as directed 01/27/22   Sherran Needs, NP  amLODipine (NORVASC) 5 MG tablet TAKE 1 TABLET (5 MG  TOTAL) BY MOUTH DAILY. 12/11/21   Bensimhon, Shaune Pascal, MD  amoxicillin-clavulanate (AUGMENTIN) 875-125 MG tablet Take 1 tablet by mouth every 12 (twelve) hours. 02/06/22   Margarita Mail, PA-C  apixaban (ELIQUIS) 5 MG TABS tablet Take 1 tablet (5 mg total) by mouth 2 (two) times daily. 05/28/21   Bensimhon, Shaune Pascal, MD  docusate sodium (COLACE) 100 MG capsule Take 1 capsule (100 mg total) by mouth every 12 (twelve) hours. 02/06/22   Carlisle Cater, PA-C  ENTRESTO 97-103 MG TAKE 1 TABLET BY MOUTH TWICE A DAY 08/24/21   Bensimhon, Shaune Pascal, MD  eplerenone (INSPRA) 25 MG tablet TAKE 1/2 TABLET BY MOUTH EVERY DAY 11/03/21   Bensimhon, Shaune Pascal, MD  Fluticasone-Salmeterol (ADVAIR) 250-50 MCG/DOSE AEPB Inhale 1 puff into the lungs 2 (two) times daily.     [provider]  furosemide (LASIX) 40 MG tablet Take one tablet by mouth on Mondays and Fridays 08/29/19   Bensimhon, Shaune Pascal, MD  ondansetron (ZOFRAN-ODT) 4 MG disintegrating tablet Take 1 tablet (4 mg total) by mouth every 8 (eight) hours as needed for nausea or vomiting. 02/06/22   Carlisle Cater, PA-C  oxyCODONE-acetaminophen (PERCOCET/ROXICET) 5-325 MG tablet Take 1 tablet by mouth every 6 (six) hours as needed for severe pain. 02/06/22   Carlisle Cater, PA-C  pantoprazole (PROTONIX) 40 MG tablet Take 40 mg by mouth daily.    [provider]  tadalafil (CIALIS) 5 MG tablet Take 5 mg by mouth daily.    [provider]    Allergies:   Allergies  Allergen Reactions   Ibuprofen Other (See Comments)    Has a-Fib, unable to take   Isosorbide Other (See Comments)    HEADACHES from Imdur   Lisinopril Cough   Digoxin And Related Rash   Spironolactone Rash    Social History:  reports that he has never smoked. He has never used smokeless tobacco. He reports that he does not drink alcohol and does not use drugs.  Family History: Family History  Problem Relation Age of Onset   Hypertension Mother     Physical  Exam: Vitals:   02/07/22 2025 02/07/22 2115 02/07/22 2128 02/07/22 2145  BP: 90/61 122/75  111/68  Pulse: 65 69  65  Resp: 18 18  13   Temp:   97.9 F (36.6 C)   SpO2: (!) 88% 100%  93%  Weight:      Height:        General:  Alert and oriented times three, well developed and nourished, Uncomfortable appearing gentleman, still nauseous Eyes: PERRLA, pink conjunctiva, no scleral icterus ENT: Moist oral mucosa, neck supple, no thyromegaly, NGT in place Lungs: clear to ascultation, no wheeze, no crackles, no use of accessory muscles Cardiovascular: regular rate and rhythm, no regurgitation, no gallops, no murmurs. No carotid bruits, no JVD Abdomen: Distended abdomen,  hard knot RLQ below umbilicus, no BS GU: not examined Neuro: CN II - XII grossly intact, sensation intact Musculoskeletal: strength 5/5 all extremities, no clubbing, cyanosis or edema Skin: no rash, no subcutaneous crepitation, no decubitus Psych: appropriate patien  Labs on Admission:  Recent Labs    02/06/22 1129 02/06/22 1143 02/07/22 1728  NA 136 138 135  K 3.8 3.9 3.9  CL 101 102 100  CO2  24  --  24  GLUCOSE 121* 124* 127*  BUN 20 23 42*  CREATININE 1.66* 1.70* 3.52*  CALCIUM 9.2  --  8.9   Recent Labs    02/06/22 1129 02/07/22 1728  AST 22 16  ALT 14 11  ALKPHOS 73 60  BILITOT 1.3* 1.6*  PROT 7.3 7.1  ALBUMIN 3.9 3.4*   Recent Labs    02/06/22 1129  LIPASE 36   Recent Labs    02/06/22 1129 02/06/22 1143 02/07/22 1728  WBC 17.0*  --  16.9*  NEUTROABS 15.1*  --  15.0*  HGB 12.8* 14.3 12.5*  HCT 39.7 42.0 39.1  MCV 75.8*  --  76.1*  PLT 390  --  377      Radiological Exams on Admission: CT CHEST ABDOMEN PELVIS WO CONTRAST  Result Date: 02/07/2022 CLINICAL DATA:  c/f recurrence strangulated hernia SBO EXAM: CT CHEST, ABDOMEN AND PELVIS WITHOUT CONTRAST TECHNIQUE: Multidetector CT imaging of the chest, abdomen and pelvis was performed following the standard protocol without IV  contrast. RADIATION DOSE REDUCTION: This exam was performed according to the departmental dose-optimization program which includes automated exposure control, adjustment of the mA and/or kV according to patient size and/or use of iterative reconstruction technique. COMPARISON:  CT abdomen pelvis 02/06/2022 FINDINGS: CT CHEST FINDINGS Cardiovascular: Prominent heart size. No significant pericardial effusion. The thoracic aorta is normal in caliber. No atherosclerotic plaque of the thoracic aorta. No coronary artery calcifications. Mediastinum/Nodes: No enlarged mediastinal, hilar, or axillary lymph nodes. Thyroid gland, trachea, and esophagus demonstrate no significant findings. Moderate volume hiatal hernia with enteric tube tip terminating in the hiatal hernia. Enteric tube side port at the gastroesophageal junction that is located within the mediastinum given hernia. Lungs/Pleura: Peribronchovascular right middle and right lower lobe ground-glass airspace opacities. Linear atelectasis of the right lower lobe. No pulmonary nodule. No pulmonary mass. No pleural effusion. No pneumothorax. Musculoskeletal: No chest wall abnormality. No suspicious lytic or blastic osseous lesions. No acute displaced fracture. Multilevel degenerative changes of the spine. CT ABDOMEN PELVIS FINDINGS Hepatobiliary: No focal liver abnormality. No gallstones, gallbladder wall thickening, or pericholecystic fluid. No biliary dilatation. Pancreas: Diffusely mildly atrophic. No focal lesion. Otherwise normal pancreatic contour. No surrounding inflammatory changes. No main pancreatic ductal dilatation. Spleen: Normal in size without focal abnormality. Adrenals/Urinary Tract: No adrenal nodule bilaterally. No nephrolithiasis and no hydronephrosis. Simple appearing parapelvic cysts. Simple fluid dense parenchymal lesion within the kidneys likely represent simple renal cysts. Simple renal cysts, in the absence of clinically indicated  signs/symptoms, require no independent follow-up. No ureterolithiasis or hydroureter. The urinary bladder is unremarkable. Excretion of previously administered intravenous contrast noted within bilateral kidneys. Stomach/Bowel: Stomach is within normal limits. Interval increase in proximal mid small bowel fluid dilatation measuring up to 5.1 cm with associated air-fluid levels. Transition point noted at the supraumbilical ventral hernia containing a short loop of small bowel. (3:89). Associated mesenteric fat stranding of the contained loop of bowel within the hernia as well as the mesentery outside of the hernia sac. No pneumatosis. No evidence of large bowel wall thickening or dilatation. Appendix appears normal. Vascular/Lymphatic: No portal venous or mesenteric venous gas. No abdominal aorta or iliac aneurysm. Mild atherosclerotic plaque of the aorta and its branches. No abdominal, pelvic, or inguinal lymphadenopathy. Reproductive: Prostate is unremarkable. Other: No intraperitoneal free fluid. No intraperitoneal free gas. No organized fluid collection. Musculoskeletal: Trace fat containing right inguinal hernia. Left inguinal hernia repair with mesh with no recurrence. Diffuse subcutaneus soft tissue  edema of the anterior abdominal wall surrounding the ventral hernia. No suspicious lytic or blastic osseous lesions. No acute displaced fracture. Multilevel degenerative changes of the spine. Chronic appearing mild L1 anterior wedge compression fracture. IMPRESSION: 1. Interval worsening of small-bowel obstruction due to incarcerated supraumbilical ventral hernia containing a short loop of small bowel. Mesenteric fat stranding of the associated small bowel is concerning for ischemia; however, there is limited evaluation for associated ischemia due to noncontrast study. No associated bowel perforation. Recommend emergent surgical consultation. 2. Associated right middle and right lower lobe aspiration pneumonia. 3.  Moderate volume hiatal hernia with enteric tube tip terminating in the hiatal hernia. Enteric tube side port at the gastroesophageal junction that is located within the mediastinum given hernia. 4. Trace fat containing right inguinal hernia. 5. Left inguinal hernia repair with mesh with no recurrence. 6. Prominent heart size. 7.  Aortic Atherosclerosis (ICD10-I70.0). These results were called by telephone at the time of interpretation on 02/07/2022 at 10:25 pm to provider Georgina Snell , who verbally acknowledged these results. Electronically Signed   By: Iven Finn M.D.   On: 02/07/2022 22:36   DG Abd Portable 1 View  Result Date: 02/07/2022 CLINICAL DATA:  NG placement EXAM: PORTABLE ABDOMEN - 1 VIEW COMPARISON:  CT chest abdomen pelvis 02/07/2022 FINDINGS: Enteric tube noted with tip in the expected region of the gastroesophageal junction-given small hiatal hernia, likely within the gastric lumen. Gaseous dilatation of loops of small bowel in the left upper abdomen. No radio-opaque calculi or other significant radiographic abnormality are seen. Right lower lung zone linear atelectasis versus scarring. Left base atelectasis. IMPRESSION: 1. Enteric tube noted with tip in the expected region of the gastroesophageal junction-given small hiatal hernia, likely within the gastric lumen. Please see separately dictated CT abdomen pelvis 02/07/2022. 2. Small-bowel obstruction. Please see separately dictated CT abdomen pelvis 02/07/2022. Electronically Signed   By: Iven Finn M.D.   On: 02/07/2022 22:14   CT ABDOMEN PELVIS W CONTRAST  Result Date: 02/06/2022 CLINICAL DATA:  Hernia suspected, abdominal wall large, hardened skin with overlying warmth. Not reproducible. EXAM: CT ABDOMEN AND PELVIS WITH CONTRAST TECHNIQUE: Multidetector CT imaging of the abdomen and pelvis was performed using the standard protocol following bolus administration of intravenous contrast. RADIATION DOSE REDUCTION: This exam was  performed according to the departmental dose-optimization program which includes automated exposure control, adjustment of the mA and/or kV according to patient size and/or use of iterative reconstruction technique. CONTRAST:  50mL OMNIPAQUE IOHEXOL 350 MG/ML SOLN COMPARISON:  07/31/2018 FINDINGS: Lower chest: Mild right basilar atelectasis. Moderate hiatal hernia. Hepatobiliary: No focal liver abnormality is seen. Subtle hyperdensity in the gallbladder could reflect biliary sludge. No hyperdense gallstone. No evidence of wall thickening or pericholecystic inflammatory changes. No biliary dilatation. Pancreas: Unremarkable. No pancreatic ductal dilatation or surrounding inflammatory changes. Spleen: Normal in size without focal abnormality. Adrenals/Urinary Tract: Unremarkable adrenal glands. Multiple bilateral renal cysts including renal sinus cysts, not appreciably changed and do not require dedicated follow-up imaging. No renal stone or evidence of hydronephrosis. Urinary bladder within normal limits. Stomach/Bowel: Moderate-sized hiatal hernia. Stomach is otherwise within normal limits. Small-bowel obstruction secondary to a supraumbilical abdominal wall hernia containing small bowel which is abnormal in appearance. Bowel within the hernia sac is fluid-filled with mucosal hyperenhancement. Surrounding fat stranding. There is dilation of bowel upstream to the hernia. The small bowel distal to the hernia is decompressed. No focal colonic wall thickening or inflammatory changes. Vascular/Lymphatic: No significant vascular findings are present.  No enlarged abdominal or pelvic lymph nodes. Reproductive: Prostate is unremarkable. Other: Prominent fat stranding within the subcutaneous soft tissues surrounding the supraumbilical hernia. No ascites. No pneumoperitoneum. Prior left inguinal hernia repair. Small fat containing right inguinal hernia. Musculoskeletal: No acute osseous abnormality. IMPRESSION: 1. Small-bowel  obstruction secondary to a supraumbilical abdominal wall hernia containing small bowel. Distended small bowel within the hernia sac is fluid-filled with mucosal hyperenhancement and surrounding fat stranding. Findings are concerning for strangulation. Surgical consultation is recommended. 2. Moderate-sized hiatal hernia. 3. Subtle hyperdensity in the gallbladder could reflect biliary sludge. No evidence of acute cholecystitis. 4. Small fat containing right inguinal hernia. These results were called by telephone at the time of interpretation on 02/06/2022 at 2:01 pm to provider Dr. Rondel Oh, who verbally acknowledged these results. Electronically Signed   By: Davina Poke D.O.   On: 02/06/2022 14:06    Assessment/Plan Present on Admission:  SBO/strangulated abdominal wall hernia -Admit to progressive care -NPO, NGT -to the OR tonight by Dr Conners -IVF and pain meds PRN -IV antibiotics   Aspiration pneumonia ([place]) -IV zosyn -Remains on room air  Acute on chronic kidney injury -gentle IVF hydration -Strict I/O's -BMP in a.m.   Persistent atrial fibrillation (HCC)  Secondary hypercoagulable state ([place]) -amiodarone drip ordered -last dose to eliquis was earlier today, on hold -patient type and screen ordered  HTN -Norvasc on hold -On Inspra.  Follow-up labs in a.m. -PRN blood pressure medications  COPD -Clinic Advair, Inspra, albuterol  Chronic systolic CHF -Continue Entresto  OSA -does not use CPaP   Danaija Eskridge 02/07/2022, 10:56 PM

## 2022-02-07 NOTE — ED Triage Notes (Signed)
Pt arrives with c/o ABD pain that he was seen yesterday for the same. Pt endorses n/v. Pt denies fevers at home.

## 2022-02-07 NOTE — Anesthesia Preprocedure Evaluation (Signed)
Anesthesia Evaluation  Patient identified by MRN, date of birth, ID band Patient awake    Reviewed: Allergy & Precautions, H&P , NPO status , Patient's Chart, lab work & pertinent test results  Airway Mallampati: II   Neck ROM: full    Dental   Pulmonary asthma , sleep apnea    breath sounds clear to auscultation       Cardiovascular hypertension, + dysrhythmias Atrial Fibrillation  Rhythm:irregular Rate:Normal  TTE (09/04/21): EF 50%   Neuro/Psych    GI/Hepatic ,GERD  ,,Incarcerated ventral hernia.    Endo/Other    Renal/GU stones     Musculoskeletal   Abdominal   Peds  Hematology   Anesthesia Other Findings   Reproductive/Obstetrics                             Anesthesia Physical Anesthesia Plan  ASA: 3  Anesthesia Plan: General   Post-op Pain Management:    Induction: Intravenous, Rapid sequence and Cricoid pressure planned  PONV Risk Score and Plan: 2 and Ondansetron, Dexamethasone, Midazolam and Treatment may vary due to age or medical condition  Airway Management Planned: Oral ETT  Additional Equipment:   Intra-op Plan:   Post-operative Plan: Extubation in OR  Informed Consent: I have reviewed the patients History and Physical, chart, labs and discussed the procedure including the risks, benefits and alternatives for the proposed anesthesia with the patient or authorized representative who has indicated his/her understanding and acceptance.     Dental advisory given  Plan Discussed with: CRNA, Anesthesiologist and Surgeon  Anesthesia Plan Comments:        Anesthesia Quick Evaluation

## 2022-02-08 ENCOUNTER — Other Ambulatory Visit: Payer: Self-pay

## 2022-02-08 ENCOUNTER — Inpatient Hospital Stay (HOSPITAL_COMMUNITY): Payer: Medicare HMO

## 2022-02-08 ENCOUNTER — Encounter (HOSPITAL_COMMUNITY): Payer: Self-pay | Admitting: Family Medicine

## 2022-02-08 DIAGNOSIS — K430 Incisional hernia with obstruction, without gangrene: Principal | ICD-10-CM

## 2022-02-08 DIAGNOSIS — I4819 Other persistent atrial fibrillation: Secondary | ICD-10-CM | POA: Diagnosis not present

## 2022-02-08 DIAGNOSIS — N179 Acute kidney failure, unspecified: Secondary | ICD-10-CM | POA: Diagnosis not present

## 2022-02-08 DIAGNOSIS — I5042 Chronic combined systolic (congestive) and diastolic (congestive) heart failure: Secondary | ICD-10-CM | POA: Diagnosis not present

## 2022-02-08 DIAGNOSIS — I10 Essential (primary) hypertension: Secondary | ICD-10-CM

## 2022-02-08 LAB — CBC WITH DIFFERENTIAL/PLATELET
Abs Immature Granulocytes: 0.06 10*3/uL (ref 0.00–0.07)
Basophils Absolute: 0 10*3/uL (ref 0.0–0.1)
Basophils Relative: 0 %
Eosinophils Absolute: 0 10*3/uL (ref 0.0–0.5)
Eosinophils Relative: 0 %
HCT: 32.6 % — ABNORMAL LOW (ref 39.0–52.0)
Hemoglobin: 10.6 g/dL — ABNORMAL LOW (ref 13.0–17.0)
Immature Granulocytes: 0 %
Lymphocytes Relative: 2 %
Lymphs Abs: 0.3 10*3/uL — ABNORMAL LOW (ref 0.7–4.0)
MCH: 24.7 pg — ABNORMAL LOW (ref 26.0–34.0)
MCHC: 32.5 g/dL (ref 30.0–36.0)
MCV: 75.8 fL — ABNORMAL LOW (ref 80.0–100.0)
Monocytes Absolute: 0.8 10*3/uL (ref 0.1–1.0)
Monocytes Relative: 6 %
Neutro Abs: 12.6 10*3/uL — ABNORMAL HIGH (ref 1.7–7.7)
Neutrophils Relative %: 92 %
Platelets: 294 10*3/uL (ref 150–400)
RBC: 4.3 MIL/uL (ref 4.22–5.81)
RDW: 17.5 % — ABNORMAL HIGH (ref 11.5–15.5)
WBC: 13.7 10*3/uL — ABNORMAL HIGH (ref 4.0–10.5)
nRBC: 0 % (ref 0.0–0.2)

## 2022-02-08 LAB — COMPREHENSIVE METABOLIC PANEL
ALT: 10 U/L (ref 0–44)
AST: 14 U/L — ABNORMAL LOW (ref 15–41)
Albumin: 2.8 g/dL — ABNORMAL LOW (ref 3.5–5.0)
Alkaline Phosphatase: 49 U/L (ref 38–126)
Anion gap: 11 (ref 5–15)
BUN: 44 mg/dL — ABNORMAL HIGH (ref 8–23)
CO2: 23 mmol/L (ref 22–32)
Calcium: 8.2 mg/dL — ABNORMAL LOW (ref 8.9–10.3)
Chloride: 103 mmol/L (ref 98–111)
Creatinine, Ser: 3.62 mg/dL — ABNORMAL HIGH (ref 0.61–1.24)
GFR, Estimated: 18 mL/min — ABNORMAL LOW (ref >60)
Glucose, Bld: 135 mg/dL — ABNORMAL HIGH (ref 70–99)
Potassium: 4.2 mmol/L (ref 3.5–5.1)
Sodium: 137 mmol/L (ref 135–145)
Total Bilirubin: 1.5 mg/dL — ABNORMAL HIGH (ref 0.3–1.2)
Total Protein: 5.8 g/dL — ABNORMAL LOW (ref 6.5–8.1)

## 2022-02-08 LAB — TYPE AND SCREEN
ABO/RH(D): A POS
Antibody Screen: NEGATIVE

## 2022-02-08 LAB — MAGNESIUM: Magnesium: 2 mg/dL (ref 1.7–2.4)

## 2022-02-08 LAB — PROTIME-INR
INR: 1.8 — ABNORMAL HIGH (ref 0.8–1.2)
Prothrombin Time: 21 seconds — ABNORMAL HIGH (ref 11.4–15.2)

## 2022-02-08 LAB — BRAIN NATRIURETIC PEPTIDE: B Natriuretic Peptide: 52.1 pg/mL (ref 0.0–100.0)

## 2022-02-08 LAB — APTT: aPTT: 39 seconds — ABNORMAL HIGH (ref 24–36)

## 2022-02-08 LAB — HIV ANTIBODY (ROUTINE TESTING W REFLEX): HIV Screen 4th Generation wRfx: NONREACTIVE

## 2022-02-08 MED ORDER — LIDOCAINE 2% (20 MG/ML) 5 ML SYRINGE
INTRAMUSCULAR | Status: DC | PRN
  Administered 2022-02-07: 60 mg via INTRAVENOUS

## 2022-02-08 MED ORDER — PROCHLORPERAZINE EDISYLATE 10 MG/2ML IJ SOLN: 5.0000 mg | INTRAMUSCULAR | Status: DC | PRN

## 2022-02-08 MED ORDER — KCL IN DEXTROSE-NACL 20-5-0.45 MEQ/L-%-% IV SOLN
INTRAVENOUS | Status: DC
  Filled 2022-02-08 (×2): qty 1000

## 2022-02-08 MED ORDER — HYDRALAZINE HCL 20 MG/ML IJ SOLN: 10.0000 mg | Freq: Four times a day (QID) | INTRAMUSCULAR | Status: DC | PRN

## 2022-02-08 MED ORDER — HYDROMORPHONE HCL 1 MG/ML IJ SOLN
INTRAMUSCULAR | Status: AC
  Filled 2022-02-08: qty 1

## 2022-02-08 MED ORDER — ONDANSETRON HCL 4 MG/2ML IJ SOLN
INTRAMUSCULAR | Status: DC | PRN
  Administered 2022-02-08: 4 mg via INTRAVENOUS

## 2022-02-08 MED ORDER — SUGAMMADEX SODIUM 200 MG/2ML IV SOLN
INTRAVENOUS | Status: DC | PRN
  Administered 2022-02-08: 100 mg via INTRAVENOUS
  Administered 2022-02-08: 50 mg via INTRAVENOUS
  Administered 2022-02-08: 100 mg via INTRAVENOUS

## 2022-02-08 MED ORDER — HYDROMORPHONE HCL 1 MG/ML IJ SOLN
0.5000 mg | INTRAMUSCULAR | Status: DC | PRN
  Administered 2022-02-08 (×3): 1 mg via INTRAVENOUS
  Administered 2022-02-09 – 2022-02-10 (×4): 0.5 mg via INTRAVENOUS
  Filled 2022-02-08 (×2): qty 1
  Filled 2022-02-08: qty 0.5
  Filled 2022-02-08 (×3): qty 1
  Filled 2022-02-08: qty 0.5

## 2022-02-08 MED ORDER — EPHEDRINE 5 MG/ML INJ
INTRAVENOUS | Status: AC
  Filled 2022-02-08: qty 5

## 2022-02-08 MED ORDER — FENTANYL CITRATE (PF) 100 MCG/2ML IJ SOLN
INTRAMUSCULAR | Status: AC
  Filled 2022-02-08: qty 2

## 2022-02-08 MED ORDER — SODIUM CHLORIDE 0.9 % IV SOLN: INTRAVENOUS | Status: DC

## 2022-02-08 MED ORDER — METHOCARBAMOL 1000 MG/10ML IJ SOLN
500.0000 mg | Freq: Four times a day (QID) | INTRAVENOUS | Status: DC | PRN
  Administered 2022-02-09: 500 mg via INTRAVENOUS
  Filled 2022-02-08 (×4): qty 5

## 2022-02-08 MED ORDER — MORPHINE SULFATE (PF) 2 MG/ML IV SOLN
INTRAVENOUS | Status: AC
  Filled 2022-02-08: qty 1

## 2022-02-08 MED ORDER — PHENYLEPHRINE 80 MCG/ML (10ML) SYRINGE FOR IV PUSH (FOR BLOOD PRESSURE SUPPORT)
PREFILLED_SYRINGE | INTRAVENOUS | Status: AC
  Filled 2022-02-08: qty 10

## 2022-02-08 MED ORDER — SUCCINYLCHOLINE CHLORIDE 200 MG/10ML IV SOSY
PREFILLED_SYRINGE | INTRAVENOUS | Status: AC
  Filled 2022-02-08: qty 10

## 2022-02-08 MED ORDER — FENTANYL CITRATE (PF) 250 MCG/5ML IJ SOLN
INTRAMUSCULAR | Status: DC | PRN
  Administered 2022-02-08: 100 ug via INTRAVENOUS

## 2022-02-08 MED ORDER — ONDANSETRON HCL 4 MG/2ML IJ SOLN
4.0000 mg | Freq: Four times a day (QID) | INTRAMUSCULAR | Status: DC | PRN
  Administered 2022-02-08 (×3): 4 mg via INTRAVENOUS
  Filled 2022-02-08 (×5): qty 2

## 2022-02-08 MED ORDER — SUCCINYLCHOLINE CHLORIDE 200 MG/10ML IV SOSY
PREFILLED_SYRINGE | INTRAVENOUS | Status: DC | PRN
  Administered 2022-02-07: 140 mg via INTRAVENOUS

## 2022-02-08 MED ORDER — OXYCODONE HCL 5 MG/5ML PO SOLN: 5.0000 mg | Freq: Once | ORAL | Status: DC | PRN

## 2022-02-08 MED ORDER — OXYCODONE HCL 5 MG PO TABS: 5.0000 mg | ORAL_TABLET | Freq: Once | ORAL | Status: DC | PRN

## 2022-02-08 MED ORDER — PHENYLEPHRINE 80 MCG/ML (10ML) SYRINGE FOR IV PUSH (FOR BLOOD PRESSURE SUPPORT)
PREFILLED_SYRINGE | INTRAVENOUS | Status: DC | PRN
  Administered 2022-02-07: 160 ug via INTRAVENOUS
  Administered 2022-02-07: 80 ug via INTRAVENOUS
  Administered 2022-02-07: 160 ug via INTRAVENOUS
  Administered 2022-02-08: 120 ug via INTRAVENOUS
  Administered 2022-02-08: 80 ug via INTRAVENOUS

## 2022-02-08 MED ORDER — ONDANSETRON HCL 4 MG/2ML IJ SOLN: 4.0000 mg | Freq: Four times a day (QID) | INTRAMUSCULAR | Status: DC | PRN

## 2022-02-08 MED ORDER — ALBUMIN HUMAN 5 % IV SOLN: INTRAVENOUS | Status: DC | PRN

## 2022-02-08 MED ORDER — ROCURONIUM BROMIDE 10 MG/ML (PF) SYRINGE
PREFILLED_SYRINGE | INTRAVENOUS | Status: AC
  Filled 2022-02-08: qty 20

## 2022-02-08 MED ORDER — PROPOFOL 10 MG/ML IV BOLUS
INTRAVENOUS | Status: DC | PRN
  Administered 2022-02-07: 180 mg via INTRAVENOUS

## 2022-02-08 MED ORDER — FENTANYL CITRATE (PF) 100 MCG/2ML IJ SOLN
25.0000 ug | INTRAMUSCULAR | Status: DC | PRN
  Administered 2022-02-08 (×3): 50 ug via INTRAVENOUS

## 2022-02-08 MED ORDER — HYDROMORPHONE HCL 1 MG/ML IJ SOLN
0.5000 mg | INTRAMUSCULAR | Status: DC | PRN
  Administered 2022-02-08 (×2): 0.5 mg via INTRAVENOUS

## 2022-02-08 MED ORDER — DEXAMETHASONE SODIUM PHOSPHATE 10 MG/ML IJ SOLN
INTRAMUSCULAR | Status: DC | PRN
  Administered 2022-02-07: 5 mg via INTRAVENOUS

## 2022-02-08 MED ORDER — METOPROLOL TARTRATE 5 MG/5ML IV SOLN: 5.0000 mg | Freq: Three times a day (TID) | INTRAVENOUS | Status: DC | PRN

## 2022-02-08 MED ORDER — 0.9 % SODIUM CHLORIDE (POUR BTL) OPTIME
TOPICAL | Status: DC | PRN
  Administered 2022-02-08: 1000 mL

## 2022-02-08 MED ORDER — ROCURONIUM BROMIDE 10 MG/ML (PF) SYRINGE
PREFILLED_SYRINGE | INTRAVENOUS | Status: DC | PRN
  Administered 2022-02-08: 50 mg via INTRAVENOUS

## 2022-02-08 MED ORDER — DEXAMETHASONE SODIUM PHOSPHATE 10 MG/ML IJ SOLN
INTRAMUSCULAR | Status: AC
  Filled 2022-02-08: qty 1

## 2022-02-08 MED ORDER — ONDANSETRON HCL 4 MG/2ML IJ SOLN
INTRAMUSCULAR | Status: AC
  Filled 2022-02-08: qty 4

## 2022-02-08 MED ORDER — LACTATED RINGERS IV SOLN: INTRAVENOUS | Status: DC

## 2022-02-08 MED ORDER — HYDROMORPHONE HCL 1 MG/ML IJ SOLN
0.5000 mg | INTRAMUSCULAR | Status: DC | PRN
  Administered 2022-02-08: 1 mg via INTRAVENOUS
  Filled 2022-02-08: qty 1

## 2022-02-08 MED ORDER — LIDOCAINE 2% (20 MG/ML) 5 ML SYRINGE
INTRAMUSCULAR | Status: AC
  Filled 2022-02-08: qty 5

## 2022-02-08 NOTE — Progress Notes (Signed)
Pacu RN Report to floor given  Gave report to  [employer] Room: 4E20   Discussed surgery, meds given in OR and Pacu, VS, IV fluids given, EBL, urine output, pain and other pertinent information. Also discussed if pt had any family or friends here or belongings with them.   Pt having pain, gave Fentanyl, Morphine and now floor order of Dilaudid with good effect.   There was an order for Amiodorone, called Dr Kae Heller (surgeon) to verify this was needed. She did not want it hung and said the team could revisit it in the AM if needed.   Midline abdominal wound with honeycomb dressing, C/D/I, no bleeding or hematoma noted. Abdominal binder is on. NGT L nare to intermittent LWS w/ 10 ml bileou output, external length is 74. Remains NPO, swabs given.   Foley cath in place, 350 output overnight. NS running at 125/hr. Zosyn was given will end at 815-830am. All belongings will be brought to his room from [place], clothes, shoes, cellphone/charger, glasses.   Pt stated he will call his family.   Pt exits my care.

## 2022-02-08 NOTE — Op Note (Signed)
Operative Note  [name]  [id number]  [id number]  02/08/2022   Surgeon: Romana Juniper MD FACS   Procedure performed:  Open primary repair of strangulated incisional ventral hernia, defect 3 x 4 cm Partial omentectomy Small bowel resection    Procedure status: Emergent  Preop diagnosis: Incarcerated incisional hernia Post-op diagnosis/intraop findings: Strangulated incisional hernia with short segment of necrotic/ischemic small bowel and necrotic omentum   Specimens: Small bowel resection Retained items: No EBL: Minimal cc Complications: none   Description of procedure: After obtaining informed consent the patient was taken to the operating room and placed supine on operating room table where general endotracheal anesthesia was initiated, preoperative antibiotics were administered, SCDs applied, and a formal timeout was performed.  The abdomen was prepped and draped in usual sterile fashion.  A midline vertical incision was made in the supraumbilical region in the area of the hernia.  The soft tissues were dissected with cautery until the hernia sac was exposed.  The hernia sac was circumferentially dissected down to the level of the fascia.  At this point the hernia sac appeared necrotic.  The hernia sac was entered sharply and noted to contain necrotic omentum as well as ischemic appearing small bowel.  The hernia sac was excised at the level of the fascia.  The fascial defect was extended cephalad by about 2 cm so that we could adequately mobilize the bowel for a bowel resection.  The necrotic omentum was brought into the field.  This was amputated after ligating with 2-0 silk ties.  The remaining viable omentum was reduced in the abdominal cavity.  The strangulated small bowel was eviscerated and noted to additionally have several patches of necrosis along the small bowel wall.  An approximately 15 cm segment of small bowel was resected with serial firings of the blue load 75 mm Endo  GIA stapler.  The intervening mesentery was suture-ligated with 2-0 silks and divided with cautery.  The specimen was handed off for pathology.  A small bowel anastomosis was created with another fire of the 75 mm blue load Endo GIA stapler.  This was inspected and confirmed be hemostatic.  The common enterotomy was closed with a final firing of the 75 mm blue load Endo GIA stapler.  The apex of the staple line was reinforced with a seromuscular 3-0 silk.  The ends of the common enterotomy closure staple line were imbricated with 3 oh silks.  Bleeding points along the staple line were also oversewn with 3 oh silks.  The mesenteric defect was closed with interrupted figure-of-eight 2-0 silk sutures.  The anastomosis was confirmed to be patent, watertight, and free of any tension.  It was well-perfused.  This was reduced in the abdominal cavity.  The omentum was brought down over the small bowel and over the anastomosis.  We then closed the fascia vertically with interrupted figure-of-eight #1 Novofils.  The soft tissues were irrigated with warm sterile saline and hemostasis ensured with cautery.  The deep soft tissues were closed with interrupted 3-0 Vicryl's.  The skin was then loosely reapproximated with staples followed by a sterile dressing.  The patient was then awakened, extubated and taken to PACU in stable condition.    All counts were correct at the completion of the case.

## 2022-02-08 NOTE — Progress Notes (Signed)
Patient ID: [name], male   DOB: Dec 03, 1955, 67 y.o.   MRN: [id number]   Postop check  Awake and alert Appears comfortable Pain controlled AF/VSS Abdomen soft, binder in place  A/P Stable post op Continue NG

## 2022-02-08 NOTE — Progress Notes (Addendum)
PROGRESS NOTE                                                                                                                                                                                                             Patient Demographics:    [name], is a 67 y.o. male, DOB - 22-Feb-1955, OVF:[id number]  Outpatient Primary MD for the patient is [name], MD    LOS - 1  Admit date - 02/07/2022    Chief Complaint  Patient presents with   Abdominal Pain       Brief Narrative (HPI from H&P)    67 year old male with past medical history of atrial fibrillation, CHF, hypertension, lymphocytic colitis, hyperlipidemia, BPH, sleep apnea.  Said that on 4 PM he developed sudden onset of pain abdominal pain over the incision site where they went to remove kidney stones and per patient where they did work for his robotic assisted prostatectomy.  In the ER his workup was consistent with strangulated abdominal wall hernia, general surgery was consulted and he was emergently taken to the OR and hospitalist team was requested to admit the patient.   Subjective:    [name] today has, No headache, No chest pain, +ve abdominal pain - No Nausea, No new weakness tingling or numbness, no SOB   Assessment  & Plan :    Strangulated abdominal wall hernia.  S/p laparotomy, repair of strangulated incisional ventral hernia with partial omentectomy and small bowel resection on 02/08/2022. He has been seen by general surgery underwent surgical correction on 02/08/2022, currently has NG tube with no bowel activity, continue IV fluids, supportive care with pain control and nausea medications.  Monitor closely.  Nausea vomiting with suspicion of aspiration pneumonia.  Present on admission.  On IV Zosyn.  Monitor closely.    AKI CKD 3B with baseline creatinine of around 2.  Due to  nausea vomiting, prerenal azotemia and being on Entresto, hydrate  with IV fluids, has Foley catheter placed in OR which will be continued, monitor renal function with hydration.   BPH.  For now Foley, Flomax once taking oral diet.  Chronic systolic heart failure.  EF 45% on TTE done 6 months ago.  Currently dehydrated.  Monitor closely with ongoing IV fluids due to n.p.o. status, ACE ARB's/Entresto on hold due to AKI.  As  needed Lopressor for now. Cards consulted as well.  Persistent atrial fibrillation [place] vas 2 score of 4.  Was on Eliquis at home currently on hold, as needed IV Lopressor along with prophylactic dose heparin.  Will switch to heparin drip on 02/09/2022 if okay by general surgery. Cards consulted as well.  OSA.  Nighttime oxygen if needed does not use CPAP.  COPD.  No acute issues.  Supportive care.  HTN.  NPO.  As needed IV Lopressor and hydralazine for now.        Condition - Extremely Guarded  Family Communication  :  Son [name] 02/08/22  Code Status :  Full  Consults  :  CCS, Cards  PUD Prophylaxis : PPI   Procedures  :      Surgeon: Romana Juniper MD FACS - 02/08/22   Procedure performed:  Open primary repair of strangulated incisional ventral hernia, defect 3 x 4 cm Partial omentectomy Small bowel resection      Disposition Plan  :    Status is: Inpatient   DVT Prophylaxis  :    heparin injection 5,000 Units Start: 02/08/22 1400 SCDs Start: 02/07/22 2312   Lab Results  Component Value Date   PLT 294 02/08/2022    Diet :  Diet Order             Diet NPO time specified  Diet effective now                    Inpatient Medications  Scheduled Meds:  fentaNYL       fentaNYL       heparin  5,000 Units Subcutaneous Q8H   HYDROmorphone       HYDROmorphone       morphine (PF)       pantoprazole (PROTONIX) IV  40 mg Intravenous Daily   Continuous Infusions:  dextrose 5 % and 0.45 % NaCl with KCl 20 mEq/L     methocarbamol (ROBAXIN) IV     piperacillin-tazobactam (ZOSYN)  IV Stopped (02/08/22  0850)   PRN Meds:.acetaminophen **OR** acetaminophen, fentaNYL, fentaNYL, fentaNYL (SUBLIMAZE) injection, HYDROmorphone, HYDROmorphone, HYDROmorphone (DILAUDID) injection, methocarbamol (ROBAXIN) IV, morphine (PF), ondansetron (ZOFRAN) IV, prochlorperazine  Antibiotics  :    Anti-infectives (From admission, onward)    Start     Dose/Rate Route Frequency Ordered Stop   02/08/22 0200  piperacillin-tazobactam (ZOSYN) IVPB 3.375 g        3.375 g 12.5 mL/hr over 240 Minutes Intravenous Every 8 hours 02/07/22 1933     02/07/22 2300  vancomycin (VANCOREADY) IVPB 1500 mg/300 mL        1,500 mg 150 mL/hr over 120 Minutes Intravenous  Once 02/07/22 2249 02/08/22 0157   02/07/22 1945  piperacillin-tazobactam (ZOSYN) IVPB 3.375 g        3.375 g 100 mL/hr over 30 Minutes Intravenous  Once 02/07/22 1931 02/07/22 2044         Objective:   Vitals:   02/08/22 0615 02/08/22 0630 02/08/22 0802 02/08/22 0900  BP:  99/61 97/65 100/60  Pulse: 64 67 63 66  Resp: 20 15 13 14   Temp:  98.2 F (36.8 C) 97.7 F (36.5 C)   TempSrc:   Axillary   SpO2: 94% 94% 92%   Weight:      Height:        Wt Readings from Last 3 Encounters:  02/07/22 114.8 kg  02/06/22 114.8 kg  01/27/22 114.9 kg     Intake/Output Summary (Last  24 hours) at 02/08/2022 1052 Last data filed at 02/08/2022 0530 Gross per 24 hour  Intake 1850 ml  Output 510 ml  Net 1340 ml     Physical Exam  Awake Alert, No new F.N deficits, NG, Foley [place].AT,PERRAL Supple Neck, No JVD,   Symmetrical Chest wall movement, Good air movement bilaterally, CTAB RRR,No Gallops,Rubs or new Murmurs,  No B. Sounds, Abd Soft,  No Cyanosis, Clubbing or edema       Data Review:    Recent Labs  Lab 02/06/22 1129 02/06/22 1143 02/07/22 1728 02/08/22 0226  WBC 17.0*  --  16.9* 13.7*  HGB 12.8* 14.3 12.5* 10.6*  HCT 39.7 42.0 39.1 32.6*  PLT 390  --  377 294  MCV 75.8*  --  76.1* 75.8*  MCH 24.4*  --  24.3* 24.7*  MCHC 32.2  --  32.0  32.5  RDW 17.2*  --  17.6* 17.5*  LYMPHSABS 0.6*  --  0.7 0.3*  MONOABS 1.2*  --  1.1* 0.8  EOSABS 0.0  --  0.0 0.0  BASOSABS 0.0  --  0.0 0.0    Recent Labs  Lab 02/06/22 1129 02/06/22 1143 02/06/22 1351 02/07/22 1728 02/07/22 1953 02/08/22 0226  NA 136 138  --  135  --  137  K 3.8 3.9  --  3.9  --  4.2  CL 101 102  --  100  --  103  CO2 24  --   --  24  --  23  ANIONGAP 11  --   --  11  --  11  GLUCOSE 121* 124*  --  127*  --  135*  BUN 20 23  --  42*  --  44*  CREATININE 1.66* 1.70*  --  3.52*  --  3.62*  AST 22  --   --  16  --  14*  ALT 14  --   --  11  --  10  ALKPHOS 73  --   --  60  --  49  BILITOT 1.3*  --   --  1.6*  --  1.5*  ALBUMIN 3.9  --   --  3.4*  --  2.8*  LATICACIDVEN  --   --  1.5 1.4 1.2  --   INR  --   --   --   --   --  1.8*  MG  --   --   --   --   --  2.0  CALCIUM 9.2  --   --  8.9  --  8.2*     Radiology Reports CT CHEST ABDOMEN PELVIS WO CONTRAST  Result Date: 02/07/2022 CLINICAL DATA:  c/f recurrence strangulated hernia SBO EXAM: CT CHEST, ABDOMEN AND PELVIS WITHOUT CONTRAST TECHNIQUE: Multidetector CT imaging of the chest, abdomen and pelvis was performed following the standard protocol without IV contrast. RADIATION DOSE REDUCTION: This exam was performed according to the departmental dose-optimization program which includes automated exposure control, adjustment of the mA and/or kV according to patient size and/or use of iterative reconstruction technique. COMPARISON:  CT abdomen pelvis 02/06/2022 FINDINGS: CT CHEST FINDINGS Cardiovascular: Prominent heart size. No significant pericardial effusion. The thoracic aorta is normal in caliber. No atherosclerotic plaque of the thoracic aorta. No coronary artery calcifications. Mediastinum/Nodes: No enlarged mediastinal, hilar, or axillary lymph nodes. Thyroid gland, trachea, and esophagus demonstrate no significant findings. Moderate volume hiatal hernia with enteric tube tip terminating in the hiatal  hernia. Enteric tube side  port at the gastroesophageal junction that is located within the mediastinum given hernia. Lungs/Pleura: Peribronchovascular right middle and right lower lobe ground-glass airspace opacities. Linear atelectasis of the right lower lobe. No pulmonary nodule. No pulmonary mass. No pleural effusion. No pneumothorax. Musculoskeletal: No chest wall abnormality. No suspicious lytic or blastic osseous lesions. No acute displaced fracture. Multilevel degenerative changes of the spine. CT ABDOMEN PELVIS FINDINGS Hepatobiliary: No focal liver abnormality. No gallstones, gallbladder wall thickening, or pericholecystic fluid. No biliary dilatation. Pancreas: Diffusely mildly atrophic. No focal lesion. Otherwise normal pancreatic contour. No surrounding inflammatory changes. No main pancreatic ductal dilatation. Spleen: Normal in size without focal abnormality. Adrenals/Urinary Tract: No adrenal nodule bilaterally. No nephrolithiasis and no hydronephrosis. Simple appearing parapelvic cysts. Simple fluid dense parenchymal lesion within the kidneys likely represent simple renal cysts. Simple renal cysts, in the absence of clinically indicated signs/symptoms, require no independent follow-up. No ureterolithiasis or hydroureter. The urinary bladder is unremarkable. Excretion of previously administered intravenous contrast noted within bilateral kidneys. Stomach/Bowel: Stomach is within normal limits. Interval increase in proximal mid small bowel fluid dilatation measuring up to 5.1 cm with associated air-fluid levels. Transition point noted at the supraumbilical ventral hernia containing a short loop of small bowel. (3:89). Associated mesenteric fat stranding of the contained loop of bowel within the hernia as well as the mesentery outside of the hernia sac. No pneumatosis. No evidence of large bowel wall thickening or dilatation. Appendix appears normal. Vascular/Lymphatic: No portal venous or mesenteric  venous gas. No abdominal aorta or iliac aneurysm. Mild atherosclerotic plaque of the aorta and its branches. No abdominal, pelvic, or inguinal lymphadenopathy. Reproductive: Prostate is unremarkable. Other: No intraperitoneal free fluid. No intraperitoneal free gas. No organized fluid collection. Musculoskeletal: Trace fat containing right inguinal hernia. Left inguinal hernia repair with mesh with no recurrence. Diffuse subcutaneus soft tissue edema of the anterior abdominal wall surrounding the ventral hernia. No suspicious lytic or blastic osseous lesions. No acute displaced fracture. Multilevel degenerative changes of the spine. Chronic appearing mild L1 anterior wedge compression fracture. IMPRESSION: 1. Interval worsening of small-bowel obstruction due to incarcerated supraumbilical ventral hernia containing a short loop of small bowel. Mesenteric fat stranding of the associated small bowel is concerning for ischemia; however, there is limited evaluation for associated ischemia due to noncontrast study. No associated bowel perforation. Recommend emergent surgical consultation. 2. Associated right middle and right lower lobe aspiration pneumonia. 3. Moderate volume hiatal hernia with enteric tube tip terminating in the hiatal hernia. Enteric tube side port at the gastroesophageal junction that is located within the mediastinum given hernia. 4. Trace fat containing right inguinal hernia. 5. Left inguinal hernia repair with mesh with no recurrence. 6. Prominent heart size. 7.  Aortic Atherosclerosis (ICD10-I70.0). These results were called by telephone at the time of interpretation on 02/07/2022 at 10:25 pm to provider Georgina Snell , who verbally acknowledged these results. Electronically Signed   By: Iven Finn M.D.   On: 02/07/2022 22:36   DG Abd Portable 1 View  Result Date: 02/07/2022 CLINICAL DATA:  NG placement EXAM: PORTABLE ABDOMEN - 1 VIEW COMPARISON:  CT chest abdomen pelvis 02/07/2022  FINDINGS: Enteric tube noted with tip in the expected region of the gastroesophageal junction-given small hiatal hernia, likely within the gastric lumen. Gaseous dilatation of loops of small bowel in the left upper abdomen. No radio-opaque calculi or other significant radiographic abnormality are seen. Right lower lung zone linear atelectasis versus scarring. Left base atelectasis. IMPRESSION: 1. Enteric tube noted with  tip in the expected region of the gastroesophageal junction-given small hiatal hernia, likely within the gastric lumen. Please see separately dictated CT abdomen pelvis 02/07/2022. 2. Small-bowel obstruction. Please see separately dictated CT abdomen pelvis 02/07/2022. Electronically Signed   By: Iven Finn M.D.   On: 02/07/2022 22:14   CT ABDOMEN PELVIS W CONTRAST  Result Date: 02/06/2022 CLINICAL DATA:  Hernia suspected, abdominal wall large, hardened skin with overlying warmth. Not reproducible. EXAM: CT ABDOMEN AND PELVIS WITH CONTRAST TECHNIQUE: Multidetector CT imaging of the abdomen and pelvis was performed using the standard protocol following bolus administration of intravenous contrast. RADIATION DOSE REDUCTION: This exam was performed according to the departmental dose-optimization program which includes automated exposure control, adjustment of the mA and/or kV according to patient size and/or use of iterative reconstruction technique. CONTRAST:  64mL OMNIPAQUE IOHEXOL 350 MG/ML SOLN COMPARISON:  07/31/2018 FINDINGS: Lower chest: Mild right basilar atelectasis. Moderate hiatal hernia. Hepatobiliary: No focal liver abnormality is seen. Subtle hyperdensity in the gallbladder could reflect biliary sludge. No hyperdense gallstone. No evidence of wall thickening or pericholecystic inflammatory changes. No biliary dilatation. Pancreas: Unremarkable. No pancreatic ductal dilatation or surrounding inflammatory changes. Spleen: Normal in size without focal abnormality. Adrenals/Urinary  Tract: Unremarkable adrenal glands. Multiple bilateral renal cysts including renal sinus cysts, not appreciably changed and do not require dedicated follow-up imaging. No renal stone or evidence of hydronephrosis. Urinary bladder within normal limits. Stomach/Bowel: Moderate-sized hiatal hernia. Stomach is otherwise within normal limits. Small-bowel obstruction secondary to a supraumbilical abdominal wall hernia containing small bowel which is abnormal in appearance. Bowel within the hernia sac is fluid-filled with mucosal hyperenhancement. Surrounding fat stranding. There is dilation of bowel upstream to the hernia. The small bowel distal to the hernia is decompressed. No focal colonic wall thickening or inflammatory changes. Vascular/Lymphatic: No significant vascular findings are present. No enlarged abdominal or pelvic lymph nodes. Reproductive: Prostate is unremarkable. Other: Prominent fat stranding within the subcutaneous soft tissues surrounding the supraumbilical hernia. No ascites. No pneumoperitoneum. Prior left inguinal hernia repair. Small fat containing right inguinal hernia. Musculoskeletal: No acute osseous abnormality. IMPRESSION: 1. Small-bowel obstruction secondary to a supraumbilical abdominal wall hernia containing small bowel. Distended small bowel within the hernia sac is fluid-filled with mucosal hyperenhancement and surrounding fat stranding. Findings are concerning for strangulation. Surgical consultation is recommended. 2. Moderate-sized hiatal hernia. 3. Subtle hyperdensity in the gallbladder could reflect biliary sludge. No evidence of acute cholecystitis. 4. Small fat containing right inguinal hernia. These results were called by telephone at the time of interpretation on 02/06/2022 at 2:01 pm to provider Dr. Rondel Oh, who verbally acknowledged these results. Electronically Signed   By: Davina Poke D.O.   On: 02/06/2022 14:06      Signature  -   Lala Lund M.D on 02/08/2022 at  10:52 AM   -  To page go to [email or web address]

## 2022-02-08 NOTE — Consult Note (Signed)
Cardiology Consultation   Patient ID: DAHLTON HINDE MRN: [id number]; DOB: 1956/01/15  Admit date: 02/07/2022 Date of Consult: 02/08/2022  PCP:  [name], MD   [facility] Providers Cardiologist:  Nishan/Bensimhon/EP ALRED   Electrophysiologist:  Thompson Grayer, MD  Advanced Heart Failure:  Glori Bickers, MD  Sleep Medicine:  Fransico Him, MD       Patient Profile:   EDNA GROVER is a 67 y.o. male with a hx of persistent atrial fib, tachycardia medicated CM, CKD, HTN, OSA  who is being seen 02/08/2022 for the evaluation of atrial fib at the request of Dr. Candiss Norse after admit for strangulated abdominal wall hernia. S/p laparotomy, repair of strangulated incisional ventral hernia with partial omentectomy and small bowel resection on 02/08/2022 .  History of Present Illness:   Mr. Smock with hx as above and in 2018 diagnosed with a fib on presentation of SOB and fatigue. 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  EF was 20%.  He initially failed DCCV and placed on amiodarone.  With rhythm control his EF improved from 20-50%.   He did well for 2 years with low dose amiodarone (had been reduced for bradycardia).  He has continued with episodes of atrial fib.  In 2021 Dr. Rayann Heman performed a fib ablation.  Pt remains on eliquis for CHA2DS2VASc of 2 and amiodarone 100 mg daily.  He underwent DCCV 01/12/22 and was in SR on follow up.  Amiodarone was placed back to 100 mg daily.  Pt to see Dr. Myles Gip for consideration of repeat ablation 02/16/22.    Pt has also been followed by AHF with Dr Haroldine Laws.  In 03/2016 he was rapid a fib and decompensation, placed on milrinone or marginal mixed venous sat of 57% and optimization with plans for repeat DCCV after loading with IV Amiodarone. Underwent TEE/DCCV on 04/23/16 with restoration of NSR.He was continued on Eliquis for anticoagulation at discharge, as he did have some evidence of LV thrombus on Echo, however it appeared to have resolved  when he had a TEE on 04/23/16.  Echo 3/18 EF 40-45% RV mildly HK  Echo 2/19 EF 40-45% RV mildly reduced Echo 2020 EF 50-55%.   Echo 8/21 EF 60-65%  Zio in 8/21 Sinus avg HR 56 with nighttime brady in 30s. 5% PVCs. No AF  Echo 09/04/21 EF 45-50% global hypokinesis, The left ventricular internal cavity size  was severely dilated.  G1DD, RV is normal. LA mod. Dilated.  Mild eccentric MVR.   Pt presented to ER 02/06/22 with sharp lower abd pain.  No N/V/D. CT scan with incarcerated incisional hernia with SBO at the hernia site.  Hernia reduced at the bedside. Pt left AMA.    He returned with AKI Cr 3.5 up from 1.7 - and he had spiked a fever. His hernia found to be at least partially incarcerated on return.   He was given fluid resuscitation and NG tube placed for decompression.   He was admitted and eliquis held and pt underwent surgery today Open primary repair of strangulated incisional ventral hernia, defect 3 x 4 cm.  Partial omentectomy.  Small bowel resection.     With AKI his ARB/Entresto held PRN lopressor.  Plan to begin IV heparin if ok by surgery.  Pt does not use CPAP.      Na 137 K+ 4.2 BUN 44 Cr 3.62 albumin 2.8, AST 14, GFR 18  BNP 52 WBC 13.7 Hgb 10.6 plts 294 INR 1.8  PCXR: IMPRESSION: 1. NG tube tip is positioned in the mid to distal esophagus. Advancing this 2 15-20 cm should place the tip in the stomach with follow-up x-ray to confirm position at that time. 2. Trace lucency under the right hemidiaphragm. While trace intraperitoneal free air could have this appearance, the patient had a abdomen/pelvis CT about 13 hours ago, demonstrating no intraperitoneal free air that time. Right-side-up decubitus film may prove helpful to further evaluate as clinically warranted.  CT chest abd last pm  IMPRESSION: 1. Interval worsening of small-bowel obstruction due to incarcerated supraumbilical ventral hernia containing a short loop of small bowel. Mesenteric fat stranding of  the associated small bowel is concerning for ischemia; however, there is limited evaluation for associated ischemia due to noncontrast study. No associated bowel perforation. Recommend emergent surgical consultation. 2. Associated right middle and right lower lobe aspiration pneumonia. 3. Moderate volume hiatal hernia with enteric tube tip terminating in the hiatal hernia. Enteric tube side port at the gastroesophageal junction that is located within the mediastinum given hernia. 4. Trace fat containing right inguinal hernia. 5. Left inguinal hernia repair with mesh with no recurrence. 6. Prominent heart size. 7.  Aortic Atherosclerosis (ICD10-I70.0). no coronary calcifications.   EKG:  The EKG was personally reviewed and demonstrates:  SR at 15 with RBBB no acute ST changes from 01/27/22   Telemetry:  Telemetry was personally reviewed and demonstrates:  SR  Post op BP 101/62 P 59-61 R 12 afebrile. Is feeling better now.  NG in place. No chest pain. No SOB  Past Medical History:  Diagnosis Date   Asthma, persistent    BPH (benign prostatic hyperplasia) 2014   CHF (congestive heart failure) (HCC)    Combined hyperlipidemia    Dysrhythmia    ATRIAL FIBRILLATION   GERD (gastroesophageal reflux disease)    History of kidney stones    Hypertension    Lymphocytic colitis 07/2011   MICROSCOPIC   Obesity    Persistent atrial fibrillation ([place])    Renal stone 04/2012   Seasonal allergic rhinitis    Sleep apnea    does not use a cpap machine    Past Surgical History:  Procedure Laterality Date   ATRIAL FIBRILLATION ABLATION N/A 03/16/2019   Procedure: ATRIAL FIBRILLATION ABLATION;  Surgeon: Thompson Grayer, MD;  Location: [facility];  Service: Cardiovascular;  Laterality: N/A;   CARDIOVERSION N/A 04/23/2016   Procedure: CARDIOVERSION;  Surgeon: Jolaine Artist, MD;  Location: [facility];  Service: Cardiovascular;  Laterality: N/A;   CARDIOVERSION N/A 01/12/2022    Procedure: CARDIOVERSION;  Surgeon: Buford Dresser, MD;  Location: [facility] ENDOSCOPY;  Service: Cardiovascular;  Laterality: N/A;   CYSTOSCOPY WITH RETROGRADE PYELOGRAM, URETEROSCOPY AND STENT PLACEMENT Left 07/29/2017   Procedure: CYSTOSCOPY WITH RETROGRADE PYELOGRAM AND LEFT STENT PLACEMENT;  Surgeon: Franchot Gallo, MD;  Location: [facility];  Service: Urology;  Laterality: Left;   EXTRACORPOREAL SHOCK WAVE LITHOTRIPSY Left 08/08/2017   Procedure: LEFT EXTRACORPOREAL SHOCK WAVE LITHOTRIPSY (ESWL);  Surgeon: Nickie Retort, MD;  Location: [facility];  Service: Urology;  Laterality: Left;   TEE WITHOUT CARDIOVERSION N/A 04/23/2016   Procedure: TRANSESOPHAGEAL ECHOCARDIOGRAM (TEE);  Surgeon: Jolaine Artist, MD;  Location: [facility] ENDOSCOPY;  Service: Cardiovascular;  Laterality: N/A;   XI ROBOTIC ASSISTED SIMPLE PROSTATECTOMY N/A 12/25/2018   Procedure: XI ROBOTIC ASSISTED SIMPLE PROSTATECTOMY, REMOVAL OF BLADDER STONES;  Surgeon: Cleon Gustin, MD;  Location: [facility];  Service: Urology;  Laterality: N/A;     Home  Medications:  Prior to Admission medications   Medication Sig Start Date End Date Taking? Authorizing Provider  acetaminophen (TYLENOL) 325 MG tablet Take 2 tablets (650 mg total) by mouth every 6 (six) hours as needed for mild pain or headache. 04/27/16  Yes Shirley Friar, PA-C  albuterol (PROVENTIL HFA;VENTOLIN HFA) 108 (90 Base) MCG/ACT inhaler Inhale 2 puffs into the lungs every 6 (six) hours as needed for wheezing or shortness of breath.   Yes [provider]  amiodarone (PACERONE) 100 MG tablet Take 1 tablet (100 mg total) by mouth daily. May take an extra tablet daily for breakthrough afib as directed Patient taking differently: Take 100 mg by mouth daily. 01/27/22  Yes Sherran Needs, NP  amLODipine (NORVASC) 5 MG tablet TAKE 1 TABLET (5 MG TOTAL) BY MOUTH DAILY. 12/11/21  Yes Bensimhon, Shaune Pascal, MD  amoxicillin-clavulanate (AUGMENTIN) 875-125 MG tablet Take  1 tablet by mouth every 12 (twelve) hours. 02/06/22  Yes Harris, Abigail, PA-C  apixaban (ELIQUIS) 5 MG TABS tablet Take 1 tablet (5 mg total) by mouth 2 (two) times daily. 05/28/21  Yes Bensimhon, Shaune Pascal, MD  docusate sodium (COLACE) 100 MG capsule Take 1 capsule (100 mg total) by mouth every 12 (twelve) hours. 02/06/22  Yes Carlisle Cater, PA-C  ENTRESTO 97-103 MG TAKE 1 TABLET BY MOUTH TWICE A DAY 08/24/21  Yes Bensimhon, Shaune Pascal, MD  eplerenone (INSPRA) 25 MG tablet TAKE 1/2 TABLET BY MOUTH EVERY DAY 11/03/21  Yes Bensimhon, Shaune Pascal, MD  furosemide (LASIX) 40 MG tablet Take one tablet by mouth on Mondays and Fridays 08/29/19  Yes Bensimhon, Shaune Pascal, MD  ondansetron (ZOFRAN-ODT) 4 MG disintegrating tablet Take 1 tablet (4 mg total) by mouth every 8 (eight) hours as needed for nausea or vomiting. 02/06/22  Yes Carlisle Cater, PA-C  oxyCODONE-acetaminophen (PERCOCET/ROXICET) 5-325 MG tablet Take 1 tablet by mouth every 6 (six) hours as needed for severe pain. 02/06/22  Yes Carlisle Cater, PA-C  pantoprazole (PROTONIX) 40 MG tablet Take 40 mg by mouth daily.   Yes [provider]  tadalafil (CIALIS) 5 MG tablet Take 5 mg by mouth daily.   Yes [provider]    Inpatient Medications: Scheduled Meds:  heparin  5,000 Units Subcutaneous Q8H   HYDROmorphone       morphine (PF)       pantoprazole (PROTONIX) IV  40 mg Intravenous Daily   Continuous Infusions:  dextrose 5 % and 0.45 % NaCl with KCl 20 mEq/L 75 mL/hr at 02/08/22 1150   methocarbamol (ROBAXIN) IV     piperacillin-tazobactam (ZOSYN)  IV Stopped (02/08/22 0850)   PRN Meds: acetaminophen **OR** acetaminophen, fentaNYL (SUBLIMAZE) injection, hydrALAZINE, HYDROmorphone, HYDROmorphone (DILAUDID) injection, methocarbamol (ROBAXIN) IV, metoprolol tartrate, morphine (PF), ondansetron (ZOFRAN) IV, prochlorperazine  Allergies:    Allergies  Allergen Reactions   Ibuprofen Other (See Comments)    Has a-Fib, unable to take    Isosorbide Other (See Comments)    HEADACHES from Imdur   Lisinopril Cough   Digoxin And Related Rash   Spironolactone Rash    Social History:   Social History   Socioeconomic History   Marital status: Married    Spouse name: Not on file   Number of children: Not on file   Years of education: Not on file   Highest education level: Not on file  Occupational History   Occupation: GREENHOUSE MANAGEMENT  Tobacco Use   Smoking status: Never   Smokeless tobacco: Never  Vaping Use  Vaping Use: Never used  Substance and Sexual Activity   Alcohol use: No   Drug use: No   Sexual activity: Not on file  Other Topics Concern   Not on file  Social History Narrative   Lives in [place] with his spouse   He works as a Research scientist (life sciences) Strain: Not on Art therapist Insecurity: Not on Pensions consultant Needs: Not on file  Physical Activity: Not on file  Stress: Not on file  Social Connections: Not on file  Intimate Partner Violence: Not on file    Family History:    Family History  Problem Relation Age of Onset   Hypertension Mother      ROS:  Please see the history of present illness.  General:no colds or fevers, no weight changes Skin:no rashes or ulcers HEENT:no blurred vision, no congestion CV:see HPI PUL:see HPI, hx COPD/asthma GI:no diarrhea constipation or melena, no indigestion, + abd pain GU:no hematuria, no dysuria MS:no joint pain, no claudication Neuro:no syncope, no lightheadedness Endo:no diabetes, no thyroid disease  All other ROS reviewed and negative.     Physical Exam/Data:   Vitals:   02/08/22 0630 02/08/22 0802 02/08/22 0900 02/08/22 1213  BP: 99/61 97/65 100/60 101/62  Pulse: 67 63 66 61  Resp: 15 13 14 16   Temp: 98.2 F (36.8 C) 97.7 F (36.5 C)  97.9 F (36.6 C)  TempSrc:  Axillary  Oral  SpO2: 94% 92%  95%  Weight:      Height:        Intake/Output Summary (Last 24 hours) at  02/08/2022 1415 Last data filed at 02/08/2022 1219 Gross per 24 hour  Intake 1880 ml  Output 810 ml  Net 1070 ml      02/07/2022    5:30 PM 02/06/2022   11:23 AM 01/27/2022    1:14 PM  Last 3 Weights  Weight (lbs) 253 lb 253 lb 253 lb 3.2 oz  Weight (kg) 114.76 kg 114.76 kg 114.851 kg     Body mass index is 34.31 kg/m.  General:  Well nourished, well developed, in no acute distress HEENT: normal Neck: no JVD Vascular: No carotid bruits; Distal pulses 2+ bilaterally Cardiac:  normal S1, S2; RRR; no murmur gallup rub or click Lungs:  clear to auscultation bilaterally, ant position, no wheezing, rhonchi or rales  Abd: tight with abdominal binder in place, just back from OR  Ext: no edema Musculoskeletal:  No deformities, BUE and BLE strength normal and equal Skin: warm and dry  Neuro:  alert and oriented X 3 MAE follows commands, no focal abnormalities noted Psych:  Normal affect    Relevant CV Studies: Echo 09/04/21 IMPRESSIONS     1. Left ventricular ejection fraction, by estimation, is 45 to 50%. The  left ventricle has mildly decreased function. The left ventricle  demonstrates global hypokinesis. The left ventricular internal cavity size  was severely dilated. Left ventricular  diastolic parameters are consistent with Grade I diastolic dysfunction  (impaired relaxation).   2. Right ventricular systolic function is normal. The right ventricular  size is normal. There is normal pulmonary artery systolic pressure.   3. Left atrial size was moderately dilated.   4. The mitral valve is normal in structure. Mild, eccentric, mitral valve  regurgitation. No evidence of mitral stenosis.   5. The aortic valve is tricuspid. Aortic valve regurgitation is not  visualized. No aortic stenosis is present.  6. The inferior vena cava is normal in size with greater than 50%  respiratory variability, suggesting right atrial pressure of 3 mmHg.   Comparison(s): Prior images reviewed side  by side. LV size has increased  from 2020-> LVIDd 6.3 cm -> 6.9 cm.   FINDINGS   Left Ventricle: Left ventricular ejection fraction, by estimation, is 45  to 50%. The left ventricle has mildly decreased function. The left  ventricle demonstrates global hypokinesis. The left ventricular internal  cavity size was severely dilated. There  is no left ventricular hypertrophy. Left ventricular diastolic parameters  are consistent with Grade I diastolic dysfunction (impaired relaxation).   Right Ventricle: The right ventricular size is normal. No increase in  right ventricular wall thickness. Right ventricular systolic function is  normal. There is normal pulmonary artery systolic pressure. The tricuspid  regurgitant velocity is 1.95 m/s, and   with an assumed right atrial pressure of 3 mmHg, the estimated right  ventricular systolic pressure is 47.8 mmHg.   Left Atrium: Left atrial size was moderately dilated.   Right Atrium: Right atrial size was normal in size.   Pericardium: There is no evidence of pericardial effusion.   Mitral Valve: The mitral valve is normal in structure. Mild mitral valve  regurgitation. No evidence of mitral valve stenosis.   Tricuspid Valve: The tricuspid valve is normal in structure. Tricuspid  valve regurgitation is not demonstrated. No evidence of tricuspid  stenosis.   Aortic Valve: The aortic valve is tricuspid. Aortic valve regurgitation is  not visualized. No aortic stenosis is present.   Pulmonic Valve: The pulmonic valve was normal in structure. Pulmonic valve  regurgitation is not visualized. No evidence of pulmonic stenosis.   Aorta: The aortic root and ascending aorta are structurally normal, with  no evidence of dilitation.   Venous: The inferior vena cava is normal in size with greater than 50%  respiratory variability, suggesting right atrial pressure of 3 mmHg.   IAS/Shunts: No atrial level shunt detected by color flow Doppler.     Laboratory Data:  High Sensitivity Troponin:  No results for input(s): "TROPONINIHS" in the last 720 hours.   Chemistry Recent Labs  Lab 02/06/22 1129 02/06/22 1143 02/07/22 1728 02/08/22 0226  NA 136 138 135 137  K 3.8 3.9 3.9 4.2  CL 101 102 100 103  CO2 24  --  24 23  GLUCOSE 121* 124* 127* 135*  BUN 20 23 42* 44*  CREATININE 1.66* 1.70* 3.52* 3.62*  CALCIUM 9.2  --  8.9 8.2*  MG  --   --   --  2.0  GFRNONAA 45*  --  18* 18*  ANIONGAP 11  --  11 11    Recent Labs  Lab 02/06/22 1129 02/07/22 1728 02/08/22 0226  PROT 7.3 7.1 5.8*  ALBUMIN 3.9 3.4* 2.8*  AST 22 16 14*  ALT 14 11 10   ALKPHOS 73 60 49  BILITOT 1.3* 1.6* 1.5*   Lipids No results for input(s): "CHOL", "TRIG", "HDL", "LABVLDL", "LDLCALC", "CHOLHDL" in the last 168 hours.  Hematology Recent Labs  Lab 02/06/22 1129 02/06/22 1143 02/07/22 1728 02/08/22 0226  WBC 17.0*  --  16.9* 13.7*  RBC 5.24  --  5.14 4.30  HGB 12.8* 14.3 12.5* 10.6*  HCT 39.7 42.0 39.1 32.6*  MCV 75.8*  --  76.1* 75.8*  MCH 24.4*  --  24.3* 24.7*  MCHC 32.2  --  32.0 32.5  RDW 17.2*  --  17.6* 17.5*  PLT  390  --  377 294   Thyroid No results for input(s): "TSH", "FREET4" in the last 168 hours.  BNP Recent Labs  Lab 02/08/22 0226  BNP 52.1    DDimer No results for input(s): "DDIMER" in the last 168 hours.   Radiology/Studies:  DG Chest Port 1 View  Result Date: 02/08/2022 CLINICAL DATA:  Shortness of breath. EXAM: PORTABLE CHEST 1 VIEW COMPARISON:  07/11/2020 FINDINGS: Subsegmental atelectasis noted at the left lung base. Left lung is clear. The cardio pericardial silhouette is enlarged. Telemetry leads overlie the chest. NG tube tip is positioned in the mid to distal esophagus. Trace lucency noted under the right hemidiaphragm. Telemetry leads overlie the chest. IMPRESSION: 1. NG tube tip is positioned in the mid to distal esophagus. Advancing this 2 15-20 cm should place the tip in the stomach with follow-up x-ray  to confirm position at that time. 2. Trace lucency under the right hemidiaphragm. While trace intraperitoneal free air could have this appearance, the patient had a abdomen/pelvis CT about 13 hours ago, demonstrating no intraperitoneal free air that time. Right-side-up decubitus film may prove helpful to further evaluate as clinically warranted. Electronically Signed   By: Misty Stanley M.D.   On: 02/08/2022 11:20   CT CHEST ABDOMEN PELVIS WO CONTRAST  Result Date: 02/07/2022 CLINICAL DATA:  c/f recurrence strangulated hernia SBO EXAM: CT CHEST, ABDOMEN AND PELVIS WITHOUT CONTRAST TECHNIQUE: Multidetector CT imaging of the chest, abdomen and pelvis was performed following the standard protocol without IV contrast. RADIATION DOSE REDUCTION: This exam was performed according to the departmental dose-optimization program which includes automated exposure control, adjustment of the mA and/or kV according to patient size and/or use of iterative reconstruction technique. COMPARISON:  CT abdomen pelvis 02/06/2022 FINDINGS: CT CHEST FINDINGS Cardiovascular: Prominent heart size. No significant pericardial effusion. The thoracic aorta is normal in caliber. No atherosclerotic plaque of the thoracic aorta. No coronary artery calcifications. Mediastinum/Nodes: No enlarged mediastinal, hilar, or axillary lymph nodes. Thyroid gland, trachea, and esophagus demonstrate no significant findings. Moderate volume hiatal hernia with enteric tube tip terminating in the hiatal hernia. Enteric tube side port at the gastroesophageal junction that is located within the mediastinum given hernia. Lungs/Pleura: Peribronchovascular right middle and right lower lobe ground-glass airspace opacities. Linear atelectasis of the right lower lobe. No pulmonary nodule. No pulmonary mass. No pleural effusion. No pneumothorax. Musculoskeletal: No chest wall abnormality. No suspicious lytic or blastic osseous lesions. No acute displaced fracture.  Multilevel degenerative changes of the spine. CT ABDOMEN PELVIS FINDINGS Hepatobiliary: No focal liver abnormality. No gallstones, gallbladder wall thickening, or pericholecystic fluid. No biliary dilatation. Pancreas: Diffusely mildly atrophic. No focal lesion. Otherwise normal pancreatic contour. No surrounding inflammatory changes. No main pancreatic ductal dilatation. Spleen: Normal in size without focal abnormality. Adrenals/Urinary Tract: No adrenal nodule bilaterally. No nephrolithiasis and no hydronephrosis. Simple appearing parapelvic cysts. Simple fluid dense parenchymal lesion within the kidneys likely represent simple renal cysts. Simple renal cysts, in the absence of clinically indicated signs/symptoms, require no independent follow-up. No ureterolithiasis or hydroureter. The urinary bladder is unremarkable. Excretion of previously administered intravenous contrast noted within bilateral kidneys. Stomach/Bowel: Stomach is within normal limits. Interval increase in proximal mid small bowel fluid dilatation measuring up to 5.1 cm with associated air-fluid levels. Transition point noted at the supraumbilical ventral hernia containing a short loop of small bowel. (3:89). Associated mesenteric fat stranding of the contained loop of bowel within the hernia as well as the mesentery outside of the hernia sac.  No pneumatosis. No evidence of large bowel wall thickening or dilatation. Appendix appears normal. Vascular/Lymphatic: No portal venous or mesenteric venous gas. No abdominal aorta or iliac aneurysm. Mild atherosclerotic plaque of the aorta and its branches. No abdominal, pelvic, or inguinal lymphadenopathy. Reproductive: Prostate is unremarkable. Other: No intraperitoneal free fluid. No intraperitoneal free gas. No organized fluid collection. Musculoskeletal: Trace fat containing right inguinal hernia. Left inguinal hernia repair with mesh with no recurrence. Diffuse subcutaneus soft tissue edema of the  anterior abdominal wall surrounding the ventral hernia. No suspicious lytic or blastic osseous lesions. No acute displaced fracture. Multilevel degenerative changes of the spine. Chronic appearing mild L1 anterior wedge compression fracture. IMPRESSION: 1. Interval worsening of small-bowel obstruction due to incarcerated supraumbilical ventral hernia containing a short loop of small bowel. Mesenteric fat stranding of the associated small bowel is concerning for ischemia; however, there is limited evaluation for associated ischemia due to noncontrast study. No associated bowel perforation. Recommend emergent surgical consultation. 2. Associated right middle and right lower lobe aspiration pneumonia. 3. Moderate volume hiatal hernia with enteric tube tip terminating in the hiatal hernia. Enteric tube side port at the gastroesophageal junction that is located within the mediastinum given hernia. 4. Trace fat containing right inguinal hernia. 5. Left inguinal hernia repair with mesh with no recurrence. 6. Prominent heart size. 7.  Aortic Atherosclerosis (ICD10-I70.0). These results were called by telephone at the time of interpretation on 02/07/2022 at 10:25 pm to provider Georgina Snell , who verbally acknowledged these results. Electronically Signed   By: Iven Finn M.D.   On: 02/07/2022 22:36   DG Abd Portable 1 View  Result Date: 02/07/2022 CLINICAL DATA:  NG placement EXAM: PORTABLE ABDOMEN - 1 VIEW COMPARISON:  CT chest abdomen pelvis 02/07/2022 FINDINGS: Enteric tube noted with tip in the expected region of the gastroesophageal junction-given small hiatal hernia, likely within the gastric lumen. Gaseous dilatation of loops of small bowel in the left upper abdomen. No radio-opaque calculi or other significant radiographic abnormality are seen. Right lower lung zone linear atelectasis versus scarring. Left base atelectasis. IMPRESSION: 1. Enteric tube noted with tip in the expected region of the  gastroesophageal junction-given small hiatal hernia, likely within the gastric lumen. Please see separately dictated CT abdomen pelvis 02/07/2022. 2. Small-bowel obstruction. Please see separately dictated CT abdomen pelvis 02/07/2022. Electronically Signed   By: Iven Finn M.D.   On: 02/07/2022 22:14   CT ABDOMEN PELVIS W CONTRAST  Result Date: 02/06/2022 CLINICAL DATA:  Hernia suspected, abdominal wall large, hardened skin with overlying warmth. Not reproducible. EXAM: CT ABDOMEN AND PELVIS WITH CONTRAST TECHNIQUE: Multidetector CT imaging of the abdomen and pelvis was performed using the standard protocol following bolus administration of intravenous contrast. RADIATION DOSE REDUCTION: This exam was performed according to the departmental dose-optimization program which includes automated exposure control, adjustment of the mA and/or kV according to patient size and/or use of iterative reconstruction technique. CONTRAST:  62mL OMNIPAQUE IOHEXOL 350 MG/ML SOLN COMPARISON:  07/31/2018 FINDINGS: Lower chest: Mild right basilar atelectasis. Moderate hiatal hernia. Hepatobiliary: No focal liver abnormality is seen. Subtle hyperdensity in the gallbladder could reflect biliary sludge. No hyperdense gallstone. No evidence of wall thickening or pericholecystic inflammatory changes. No biliary dilatation. Pancreas: Unremarkable. No pancreatic ductal dilatation or surrounding inflammatory changes. Spleen: Normal in size without focal abnormality. Adrenals/Urinary Tract: Unremarkable adrenal glands. Multiple bilateral renal cysts including renal sinus cysts, not appreciably changed and do not require dedicated follow-up imaging. No renal stone or evidence  of hydronephrosis. Urinary bladder within normal limits. Stomach/Bowel: Moderate-sized hiatal hernia. Stomach is otherwise within normal limits. Small-bowel obstruction secondary to a supraumbilical abdominal wall hernia containing small bowel which is abnormal in  appearance. Bowel within the hernia sac is fluid-filled with mucosal hyperenhancement. Surrounding fat stranding. There is dilation of bowel upstream to the hernia. The small bowel distal to the hernia is decompressed. No focal colonic wall thickening or inflammatory changes. Vascular/Lymphatic: No significant vascular findings are present. No enlarged abdominal or pelvic lymph nodes. Reproductive: Prostate is unremarkable. Other: Prominent fat stranding within the subcutaneous soft tissues surrounding the supraumbilical hernia. No ascites. No pneumoperitoneum. Prior left inguinal hernia repair. Small fat containing right inguinal hernia. Musculoskeletal: No acute osseous abnormality. IMPRESSION: 1. Small-bowel obstruction secondary to a supraumbilical abdominal wall hernia containing small bowel. Distended small bowel within the hernia sac is fluid-filled with mucosal hyperenhancement and surrounding fat stranding. Findings are concerning for strangulation. Surgical consultation is recommended. 2. Moderate-sized hiatal hernia. 3. Subtle hyperdensity in the gallbladder could reflect biliary sludge. No evidence of acute cholecystitis. 4. Small fat containing right inguinal hernia. These results were called by telephone at the time of interpretation on 02/06/2022 at 2:01 pm to provider Dr. Rondel Oh, who verbally acknowledged these results. Electronically Signed   By: Davina Poke D.O.   On: 02/06/2022 14:06     Assessment and Plan:   Strangulated abdominal wall hernia.  S/p laparotomy, repair of strangulated incisional ventral hernia with partial omentectomy and small bowel resection on 02/08/2022. Per IM and surgery.  Off eliquis Persistent atrial fib with last DCCV 01/12/22 to SR and is maintaining SR. Continue amiodarone 100 po once able to take PO- will defer to Dr. Oval Linsey if IV amiodarone needed low dose in meantime. Last DCCV was 01/12/22 - best care to continue eliquis for 4 weeks post DCCV but with  more emergent need for surgery we are close to 4 weeks.  Would add heparin IV once clear with surgery.   Chronic combines systolic and diastolic HF - been euvolemic here, but with AKI unable to continue Enestro, eplerenone, ( rash with spironolactone)  lasix  EF had improved to 60% but echo 08/2021 EF 45-50%.   Will attempt more GDMT prior to discharge. AKI per Im see above Pt with bradycardia in past monitor closely with prn lopressor.  OSA does not use CPAP N/V with concern of aspiration PNA on IV zosyn.    Risk Assessment/Risk Scores:                For questions or updates, please contact [place] Please consult [email or web address] for contact info under    Signed, Cecilie Kicks, NP  02/08/2022 2:15 PM

## 2022-02-08 NOTE — Anesthesia Procedure Notes (Signed)
Procedure Name: Intubation Date/Time: 02/07/2022 11:49 PM  Performed by: Trinna Post., CRNAPre-anesthesia Checklist: Patient identified, Emergency Drugs available, Suction available, Patient being monitored and Timeout performed Patient Re-evaluated:Patient Re-evaluated prior to induction Oxygen Delivery Method: Circle system utilized Preoxygenation: Pre-oxygenation with 100% oxygen Induction Type: IV induction, Rapid sequence and Cricoid Pressure applied Laryngoscope Size: Mac and 4 Grade View: Grade I Tube type: Oral Tube size: 7.5 mm Number of attempts: 1 Airway Equipment and Method: Stylet Placement Confirmation: ETT inserted through vocal cords under direct vision, positive ETCO2 and breath sounds checked- equal and bilateral Secured at: 22 cm Tube secured with: Tape Dental Injury: Teeth and Oropharynx as per pre-operative assessment

## 2022-02-08 NOTE — Care Management (Signed)
  Transition of Care [facility]) Screening Note   Patient Details  Name: [name] Date of Birth: September 14, 1955   Transition of Care [facility]) CM/SW Contact:    Levonne Lapping, RN Phone Number: 02/08/2022, 4:05 PM    Transition of Care Department [facility]) has reviewed patient and no TOC needs have been identified at this time. We will continue to monitor patient advancement through interdisciplinary progression rounds. If new patient transition needs arise, please place a TOC consult.

## 2022-02-08 NOTE — Evaluation (Signed)
Physical Therapy Evaluation Patient Details Name: TORION HULGAN MRN: [id number] DOB: July 11, 1955 Today's Date: 02/08/2022  History of Present Illness  Patient is a 67 y/o male who presents on 1/14 with abdominal pain. Found to have Strangulated abdominal wall hernia.  S/p laparotomy, repair of strangulated incisional ventral hernia with partial omentectomy and small bowel resection on 02/08/2022. PMH includes CHF, HTN, obesity, A-fib, sleep apnea.  Clinical Impression  Patient presents with pain and post surgical deficits s/p above surgery. Pt lives at home with his family and reports being independent for ADLs/IADLs and ambulation at baseline, works as a Development worker, international [employer]. Today, pt tolerated bed mobility, transfers and ambulation in room with supervision for safety. Education re: bracing with pillow, importance of mobility and log roll technique. Pt likely to progress well with mobility. Will follow acutely to maximize independence and mobility prior to return home.      Recommendations for follow up therapy are one component of a multi-disciplinary discharge planning process, led by the attending physician.  Recommendations may be updated based on patient status, additional functional criteria and insurance authorization.  Follow Up Recommendations No PT follow up      Assistance Recommended at Discharge PRN  Patient can return home with the following  Assist for transportation;Assistance with cooking/housework;A little help with walking and/or transfers;A little help with bathing/dressing/bathroom    Equipment Recommendations Other (comment) (TBA but likely no DME)  Recommendations for Other Services       Functional Status Assessment Patient has had a recent decline in their functional status and demonstrates the ability to make significant improvements in function in a reasonable and predictable amount of time.     Precautions / Restrictions Precautions Precautions: Fall;Other  (comment) Precaution Comments: NGT to suction Required Braces or Orthoses: Other Brace Other Brace: abdominal binder Restrictions Weight Bearing Restrictions: No      Mobility  Bed Mobility Overal bed mobility: Needs Assistance Bed Mobility: Rolling, Sidelying to Sit, Sit to Sidelying Rolling: Supervision Sidelying to sit: Supervision, HOB elevated     Sit to sidelying: Supervision, HOB elevated General bed mobility comments: Cues for log roll technique, use of rail and increased time. Mild dizziness once sitting upright, resolved quickly.    Transfers Overall transfer level: Needs assistance Equipment used: 1 person hand held assist Transfers: Sit to/from Stand Sit to Stand: Min guard           General transfer comment: Min guard for safety. stood from EOB x1 holding onto IV pole for support. Able to stand fully upright despite discomfort.    Ambulation/Gait Ambulation/Gait assistance: Supervision Gait Distance (Feet): 60 Feet Assistive device: IV Pole Gait Pattern/deviations: Step-through pattern, Decreased stride length Gait velocity: decreased Gait velocity interpretation: <1.31 ft/sec, indicative of household ambulator   General Gait Details: Slow, steady gait back/forth in room due to NGT to suction holding onto IV pole for support.  Stairs            Wheelchair Mobility    Modified Rankin (Stroke Patients Only)       Balance Overall balance assessment: No apparent balance deficits (not formally assessed)                                           Pertinent Vitals/Pain Pain Assessment Pain Assessment: 0-10 Pain Score: 2  Pain Location: abdomen Pain Descriptors / Indicators: Sore, Discomfort, Operative site guarding  Pain Intervention(s): Monitored during session, Premedicated before session, Repositioned, Ice applied    Home Living Family/patient expects to be discharged to:: Private residence Living Arrangements:  Spouse/significant other;Children (54 y/o) Available Help at Discharge: Family;Available PRN/intermittently Type of Home: House Home Access: Level entry       Home Layout: One level Home Equipment: None      Prior Function Prior Level of Function : Independent/Modified Independent             Mobility Comments: Independent, does landscape work, drives ADLs Comments: independent     Engineer, manufacturing Dominance   Dominant Hand: Right    Extremity/Trunk Assessment   Upper Extremity Assessment Upper Extremity Assessment: Defer to OT evaluation    Lower Extremity Assessment Lower Extremity Assessment: Overall WFL for tasks assessed    Cervical / Trunk Assessment Cervical / Trunk Assessment: Normal  Communication   Communication: No difficulties  Cognition Arousal/Alertness: Awake/alert Behavior During Therapy: WFL for tasks assessed/performed Overall Cognitive Status: Within Functional Limits for tasks assessed                                          General Comments General comments (skin integrity, edema, etc.): VSS on RA throughout activity.    Exercises     Assessment/Plan    PT Assessment Patient needs continued PT services  PT Problem List Decreased mobility;Pain;Decreased balance;Decreased skin integrity;Decreased knowledge of precautions       PT Treatment Interventions Therapeutic activities;Gait training;Balance training;Therapeutic exercise;Patient/family education    PT Goals (Current goals can be found in the Care Plan section)  Acute Rehab PT Goals Patient Stated Goal: to return to PLOF/go home PT Goal Formulation: With patient Time For Goal Achievement: 02/22/22 Potential to Achieve Goals: Good    Frequency Min 3X/week     Co-evaluation               AM-PAC PT "6 Clicks" Mobility  Outcome Measure Help needed turning from your back to your side while in a flat bed without using bedrails?: A Little Help needed moving from  lying on your back to sitting on the side of a flat bed without using bedrails?: A Little Help needed moving to and from a bed to a chair (including a wheelchair)?: A Little Help needed standing up from a chair using your arms (e.g., wheelchair or bedside chair)?: A Little Help needed to walk in hospital room?: A Little Help needed climbing 3-5 steps with a railing? : A Little 6 Click Score: 18    End of Session Equipment Utilized During Treatment: Other (comment) (NGT, abdominal binder) Activity Tolerance: Patient tolerated treatment well Patient left: in bed;with call bell/phone within reach Nurse Communication: Mobility status PT Visit Diagnosis: Pain Pain - part of body:  (abdomen)    Time: 6378-5885 PT Time Calculation (min) (ACUTE ONLY): 20 min   Charges:   PT Evaluation $PT Eval Moderate Complexity: 1 Mod          Marisa Severin, PT, DPT Acute Rehabilitation Services Secure chat preferred Office [place] 02/08/2022, 3:51 PM

## 2022-02-08 NOTE — Transfer of Care (Signed)
Immediate Anesthesia Transfer of Care Note  Patient: [name]  Procedure(s) Performed: EXPLORATORY LAPAROTOMY (Abdomen) SMALL BOWEL RESECTION REPAIR INCARCERATED VENTRAL  HERNIA  Patient Location: PACU  Anesthesia Type:General  Level of Consciousness: awake, alert , and oriented  Airway & Oxygen Therapy: Patient Spontanous Breathing and Patient connected to nasal cannula oxygen  Post-op Assessment: Report given to RN and Post -op Vital signs reviewed and stable  Post vital signs: Reviewed and stable  Last Vitals:  Vitals Value Taken Time  BP 114/78 02/08/22 0124  Temp    Pulse 67 02/08/22 0126  Resp 19 02/08/22 0126  SpO2 89 % 02/08/22 0126  Vitals shown include unvalidated device data.  Last Pain:  Vitals:   02/07/22 2044  PainSc: 3          Complications: No notable events documented.

## 2022-02-09 ENCOUNTER — Encounter (HOSPITAL_COMMUNITY): Payer: Self-pay | Admitting: Surgery

## 2022-02-09 DIAGNOSIS — I5042 Chronic combined systolic (congestive) and diastolic (congestive) heart failure: Secondary | ICD-10-CM | POA: Diagnosis not present

## 2022-02-09 DIAGNOSIS — I480 Paroxysmal atrial fibrillation: Secondary | ICD-10-CM | POA: Diagnosis not present

## 2022-02-09 DIAGNOSIS — N179 Acute kidney failure, unspecified: Secondary | ICD-10-CM | POA: Diagnosis not present

## 2022-02-09 DIAGNOSIS — K430 Incisional hernia with obstruction, without gangrene: Secondary | ICD-10-CM | POA: Diagnosis not present

## 2022-02-09 LAB — COMPREHENSIVE METABOLIC PANEL
ALT: 10 U/L (ref 0–44)
AST: 14 U/L — ABNORMAL LOW (ref 15–41)
Albumin: 2.6 g/dL — ABNORMAL LOW (ref 3.5–5.0)
Alkaline Phosphatase: 45 U/L (ref 38–126)
Anion gap: 10 (ref 5–15)
BUN: 51 mg/dL — ABNORMAL HIGH (ref 8–23)
CO2: 23 mmol/L (ref 22–32)
Calcium: 8.3 mg/dL — ABNORMAL LOW (ref 8.9–10.3)
Chloride: 105 mmol/L (ref 98–111)
Creatinine, Ser: 2.84 mg/dL — ABNORMAL HIGH (ref 0.61–1.24)
GFR, Estimated: 24 mL/min — ABNORMAL LOW (ref >60)
Glucose, Bld: 122 mg/dL — ABNORMAL HIGH (ref 70–99)
Potassium: 4.1 mmol/L (ref 3.5–5.1)
Sodium: 138 mmol/L (ref 135–145)
Total Bilirubin: 1.2 mg/dL (ref 0.3–1.2)
Total Protein: 5.9 g/dL — ABNORMAL LOW (ref 6.5–8.1)

## 2022-02-09 LAB — CBC WITH DIFFERENTIAL/PLATELET
Abs Immature Granulocytes: 0.06 10*3/uL (ref 0.00–0.07)
Basophils Absolute: 0 10*3/uL (ref 0.0–0.1)
Basophils Relative: 0 %
Eosinophils Absolute: 0 10*3/uL (ref 0.0–0.5)
Eosinophils Relative: 0 %
HCT: 31.3 % — ABNORMAL LOW (ref 39.0–52.0)
Hemoglobin: 9.8 g/dL — ABNORMAL LOW (ref 13.0–17.0)
Immature Granulocytes: 0 %
Lymphocytes Relative: 3 %
Lymphs Abs: 0.5 10*3/uL — ABNORMAL LOW (ref 0.7–4.0)
MCH: 24.1 pg — ABNORMAL LOW (ref 26.0–34.0)
MCHC: 31.3 g/dL (ref 30.0–36.0)
MCV: 77.1 fL — ABNORMAL LOW (ref 80.0–100.0)
Monocytes Absolute: 1 10*3/uL (ref 0.1–1.0)
Monocytes Relative: 7 %
Neutro Abs: 12.1 10*3/uL — ABNORMAL HIGH (ref 1.7–7.7)
Neutrophils Relative %: 90 %
Platelets: 319 10*3/uL (ref 150–400)
RBC: 4.06 MIL/uL — ABNORMAL LOW (ref 4.22–5.81)
RDW: 17.5 % — ABNORMAL HIGH (ref 11.5–15.5)
WBC: 13.6 10*3/uL — ABNORMAL HIGH (ref 4.0–10.5)
nRBC: 0 % (ref 0.0–0.2)

## 2022-02-09 LAB — HEPARIN LEVEL (UNFRACTIONATED): Heparin Unfractionated: >1.1 IU/mL — ABNORMAL HIGH (ref 0.30–0.70)

## 2022-02-09 LAB — BRAIN NATRIURETIC PEPTIDE: B Natriuretic Peptide: 82 pg/mL (ref 0.0–100.0)

## 2022-02-09 LAB — SURGICAL PATHOLOGY

## 2022-02-09 LAB — APTT: aPTT: 46 seconds — ABNORMAL HIGH (ref 24–36)

## 2022-02-09 LAB — MAGNESIUM: Magnesium: 2.3 mg/dL (ref 1.7–2.4)

## 2022-02-09 MED ORDER — HEPARIN (PORCINE) 25000 UT/250ML-% IV SOLN
2050.0000 [IU]/h | INTRAVENOUS | Status: DC
  Administered 2022-02-09: 1500 [IU]/h via INTRAVENOUS
  Administered 2022-02-10: 1850 [IU]/h via INTRAVENOUS
  Administered 2022-02-10 – 2022-02-11 (×2): 2050 [IU]/h via INTRAVENOUS
  Filled 2022-02-09 (×4): qty 250

## 2022-02-09 MED ORDER — KCL IN DEXTROSE-NACL 20-5-0.45 MEQ/L-%-% IV SOLN
INTRAVENOUS | Status: DC
  Filled 2022-02-09 (×2): qty 1000

## 2022-02-09 MED ORDER — SALINE SPRAY 0.65 % NA SOLN
1.0000 | NASAL | Status: DC | PRN
  Filled 2022-02-09: qty 44

## 2022-02-09 NOTE — Progress Notes (Signed)
Progress Note  2 Days Post-Op  Subjective: Pt reports pain overall well controlled. No flatus yet. Hoping to get up today and sit in the chair some.   Objective: Vital signs in last 24 hours: Temp:  [97.6 F (36.4 C)-98.2 F (36.8 C)] 98.2 F (36.8 C) (01/16 0854) Pulse Rate:  [61-66] 66 (01/16 0854) Resp:  [14-18] 14 (01/16 0854) BP: (100-126)/(59-74) 126/69 (01/16 0854) SpO2:  [93 %-95 %] 93 % (01/16 0854)    Intake/Output from previous day: 01/15 0701 - 01/16 0700 In: 320 [I.V.:198.5; NG/GT:60; IV Piggyback:61.5] Out: 1200 [Urine:850; Emesis/NG output:350] Intake/Output this shift: No intake/output data recorded.  PE: General: pleasant, WD, obese male who is laying in bed in NAD Heart: regular, rate, and rhythm.   Lungs: Respiratory effort nonlabored Abd: soft, appropriately ttp, NGT with thin bilious drainage, honeycomb to midline incision, mild distention  Psych: A&Ox3 with an appropriate affect.    Lab Results:  Recent Labs    02/08/22 0226 02/09/22 0119  WBC 13.7* 13.6*  HGB 10.6* 9.8*  HCT 32.6* 31.3*  PLT 294 319   BMET Recent Labs    02/08/22 0226 02/09/22 0119  NA 137 138  K 4.2 4.1  CL 103 105  CO2 23 23  GLUCOSE 135* 122*  BUN 44* 51*  CREATININE 3.62* 2.84*  CALCIUM 8.2* 8.3*   PT/INR Recent Labs    02/08/22 0226  LABPROT 21.0*  INR 1.8*   CMP     Component Value Date/Time   NA 138 02/09/2022 0119   NA 140 02/03/2021 1526   K 4.1 02/09/2022 0119   CL 105 02/09/2022 0119   CO2 23 02/09/2022 0119   GLUCOSE 122 (H) 02/09/2022 0119   BUN 51 (H) 02/09/2022 0119   BUN 22 02/03/2021 1526   CREATININE 2.84 (H) 02/09/2022 0119   CALCIUM 8.3 (L) 02/09/2022 0119   PROT 5.9 (L) 02/09/2022 0119   PROT 6.7 01/20/2021 1557   ALBUMIN 2.6 (L) 02/09/2022 0119   ALBUMIN 4.0 01/20/2021 1557   AST 14 (L) 02/09/2022 0119   ALT 10 02/09/2022 0119   ALKPHOS 45 02/09/2022 0119   BILITOT 1.2 02/09/2022 0119   BILITOT 0.5 01/20/2021 1557    GFRNONAA 24 (L) 02/09/2022 0119   GFRAA 52 (L) 09/21/2019 1658   Lipase     Component Value Date/Time   LIPASE 36 02/06/2022 1129       Studies/Results: DG Chest Port 1 View  Result Date: 02/08/2022 CLINICAL DATA:  Shortness of breath. EXAM: PORTABLE CHEST 1 VIEW COMPARISON:  07/11/2020 FINDINGS: Subsegmental atelectasis noted at the left lung base. Left lung is clear. The cardio pericardial silhouette is enlarged. Telemetry leads overlie the chest. NG tube tip is positioned in the mid to distal esophagus. Trace lucency noted under the right hemidiaphragm. Telemetry leads overlie the chest. IMPRESSION: 1. NG tube tip is positioned in the mid to distal esophagus. Advancing this 2 15-20 cm should place the tip in the stomach with follow-up x-ray to confirm position at that time. 2. Trace lucency under the right hemidiaphragm. While trace intraperitoneal free air could have this appearance, the patient had a abdomen/pelvis CT about 13 hours ago, demonstrating no intraperitoneal free air that time. Right-side-up decubitus film may prove helpful to further evaluate as clinically warranted. Electronically Signed   By: Misty Stanley M.D.   On: 02/08/2022 11:20   CT CHEST ABDOMEN PELVIS WO CONTRAST  Result Date: 02/07/2022 CLINICAL DATA:  c/f recurrence strangulated hernia SBO  EXAM: CT CHEST, ABDOMEN AND PELVIS WITHOUT CONTRAST TECHNIQUE: Multidetector CT imaging of the chest, abdomen and pelvis was performed following the standard protocol without IV contrast. RADIATION DOSE REDUCTION: This exam was performed according to the departmental dose-optimization program which includes automated exposure control, adjustment of the mA and/or kV according to patient size and/or use of iterative reconstruction technique. COMPARISON:  CT abdomen pelvis 02/06/2022 FINDINGS: CT CHEST FINDINGS Cardiovascular: Prominent heart size. No significant pericardial effusion. The thoracic aorta is normal in caliber. No  atherosclerotic plaque of the thoracic aorta. No coronary artery calcifications. Mediastinum/Nodes: No enlarged mediastinal, hilar, or axillary lymph nodes. Thyroid gland, trachea, and esophagus demonstrate no significant findings. Moderate volume hiatal hernia with enteric tube tip terminating in the hiatal hernia. Enteric tube side port at the gastroesophageal junction that is located within the mediastinum given hernia. Lungs/Pleura: Peribronchovascular right middle and right lower lobe ground-glass airspace opacities. Linear atelectasis of the right lower lobe. No pulmonary nodule. No pulmonary mass. No pleural effusion. No pneumothorax. Musculoskeletal: No chest wall abnormality. No suspicious lytic or blastic osseous lesions. No acute displaced fracture. Multilevel degenerative changes of the spine. CT ABDOMEN PELVIS FINDINGS Hepatobiliary: No focal liver abnormality. No gallstones, gallbladder wall thickening, or pericholecystic fluid. No biliary dilatation. Pancreas: Diffusely mildly atrophic. No focal lesion. Otherwise normal pancreatic contour. No surrounding inflammatory changes. No main pancreatic ductal dilatation. Spleen: Normal in size without focal abnormality. Adrenals/Urinary Tract: No adrenal nodule bilaterally. No nephrolithiasis and no hydronephrosis. Simple appearing parapelvic cysts. Simple fluid dense parenchymal lesion within the kidneys likely represent simple renal cysts. Simple renal cysts, in the absence of clinically indicated signs/symptoms, require no independent follow-up. No ureterolithiasis or hydroureter. The urinary bladder is unremarkable. Excretion of previously administered intravenous contrast noted within bilateral kidneys. Stomach/Bowel: Stomach is within normal limits. Interval increase in proximal mid small bowel fluid dilatation measuring up to 5.1 cm with associated air-fluid levels. Transition point noted at the supraumbilical ventral hernia containing a short loop of  small bowel. (3:89). Associated mesenteric fat stranding of the contained loop of bowel within the hernia as well as the mesentery outside of the hernia sac. No pneumatosis. No evidence of large bowel wall thickening or dilatation. Appendix appears normal. Vascular/Lymphatic: No portal venous or mesenteric venous gas. No abdominal aorta or iliac aneurysm. Mild atherosclerotic plaque of the aorta and its branches. No abdominal, pelvic, or inguinal lymphadenopathy. Reproductive: Prostate is unremarkable. Other: No intraperitoneal free fluid. No intraperitoneal free gas. No organized fluid collection. Musculoskeletal: Trace fat containing right inguinal hernia. Left inguinal hernia repair with mesh with no recurrence. Diffuse subcutaneus soft tissue edema of the anterior abdominal wall surrounding the ventral hernia. No suspicious lytic or blastic osseous lesions. No acute displaced fracture. Multilevel degenerative changes of the spine. Chronic appearing mild L1 anterior wedge compression fracture. IMPRESSION: 1. Interval worsening of small-bowel obstruction due to incarcerated supraumbilical ventral hernia containing a short loop of small bowel. Mesenteric fat stranding of the associated small bowel is concerning for ischemia; however, there is limited evaluation for associated ischemia due to noncontrast study. No associated bowel perforation. Recommend emergent surgical consultation. 2. Associated right middle and right lower lobe aspiration pneumonia. 3. Moderate volume hiatal hernia with enteric tube tip terminating in the hiatal hernia. Enteric tube side port at the gastroesophageal junction that is located within the mediastinum given hernia. 4. Trace fat containing right inguinal hernia. 5. Left inguinal hernia repair with mesh with no recurrence. 6. Prominent heart size. 7.  Aortic Atherosclerosis (  ICD10-I70.0). These results were called by telephone at the time of interpretation on 02/07/2022 at 10:25 pm to  provider Georgina Snell , who verbally acknowledged these results. Electronically Signed   By: Iven Finn M.D.   On: 02/07/2022 22:36   DG Abd Portable 1 View  Result Date: 02/07/2022 CLINICAL DATA:  NG placement EXAM: PORTABLE ABDOMEN - 1 VIEW COMPARISON:  CT chest abdomen pelvis 02/07/2022 FINDINGS: Enteric tube noted with tip in the expected region of the gastroesophageal junction-given small hiatal hernia, likely within the gastric lumen. Gaseous dilatation of loops of small bowel in the left upper abdomen. No radio-opaque calculi or other significant radiographic abnormality are seen. Right lower lung zone linear atelectasis versus scarring. Left base atelectasis. IMPRESSION: 1. Enteric tube noted with tip in the expected region of the gastroesophageal junction-given small hiatal hernia, likely within the gastric lumen. Please see separately dictated CT abdomen pelvis 02/07/2022. 2. Small-bowel obstruction. Please see separately dictated CT abdomen pelvis 02/07/2022. Electronically Signed   By: Iven Finn M.D.   On: 02/07/2022 22:14    Anti-infectives: Anti-infectives (From admission, onward)    Start     Dose/Rate Route Frequency Ordered Stop   02/08/22 0200  piperacillin-tazobactam (ZOSYN) IVPB 3.375 g        3.375 g 12.5 mL/hr over 240 Minutes Intravenous Every 8 hours 02/07/22 1933     02/07/22 2300  vancomycin (VANCOREADY) IVPB 1500 mg/300 mL        1,500 mg 150 mL/hr over 120 Minutes Intravenous  Once 02/07/22 2249 02/08/22 0157   02/07/22 1945  piperacillin-tazobactam (ZOSYN) IVPB 3.375 g        3.375 g 100 mL/hr over 30 Minutes Intravenous  Once 02/07/22 1931 02/07/22 2044        Assessment/Plan  POD1 s/p  open primary repair of strangulated incisional ventral hernia, partial omentectomy, small bowel resection 02/08/22 Dr. Kae Heller - NGT with thin bilious output (350 cc recorded in last 24 hr) - await return of bowel function  - DC foley this AM - mobilize,  PT/OT  FEN: NPO, IVF, NGT to LIWS VTE: SCDs, SQH - ok to have heparin gtt if needed w/o bolus ID: [id number] 1/14>> continue through POD5  - below per TRH -  Atrial fibrillation on Eliquis CHF HTN Lymphocytic colitis  HLD BPH OSA  LOS: 2 days    Norm Parcel, [facility] Surgery 02/09/2022, 9:07 AM Please see Amion for pager number during day hours 7:00am-4:30pm

## 2022-02-09 NOTE — Progress Notes (Signed)
Rounding Note    Patient Name: [name] Date of Encounter: 02/09/2022  [place] Cardiologist: Jenkins Rouge, MD   Subjective   No chest pain, abd pain improved no SOB  Inpatient Medications    Scheduled Meds:  heparin  5,000 Units Subcutaneous Q8H   pantoprazole (PROTONIX) IV  40 mg Intravenous Daily   Continuous Infusions:  dextrose 5 % and 0.45 % NaCl with KCl 20 mEq/L 75 mL/hr at 02/09/22 0320   methocarbamol (ROBAXIN) IV     piperacillin-tazobactam (ZOSYN)  IV 3.375 g (02/09/22 0640)   PRN Meds: acetaminophen **OR** acetaminophen, fentaNYL (SUBLIMAZE) injection, hydrALAZINE, HYDROmorphone (DILAUDID) injection, methocarbamol (ROBAXIN) IV, metoprolol tartrate, ondansetron (ZOFRAN) IV, prochlorperazine   Vital Signs    Vitals:   02/08/22 1550 02/08/22 2010 02/08/22 2300 02/09/22 0322  BP: 111/65 100/66 (!) 105/59 116/74  Pulse: 62 63 62 66  Resp: 18 15 14 14   Temp: 97.6 F (36.4 C) 98.2 F (36.8 C) 98 F (36.7 C) 98.2 F (36.8 C)  TempSrc: Oral Oral Axillary Oral  SpO2: 94% 94% 94% 94%  Weight:      Height:        Intake/Output Summary (Last 24 hours) at 02/09/2022 0811 Last data filed at 02/09/2022 0644 Gross per 24 hour  Intake 290 ml  Output 1200 ml  Net -910 ml      02/07/2022    5:30 PM 02/06/2022   11:23 AM 01/27/2022    1:14 PM  Last 3 Weights  Weight (lbs) 253 lb 253 lb 253 lb 3.2 oz  Weight (kg) 114.76 kg 114.76 kg 114.851 kg      Telemetry    SR with PACs  - Personally Reviewed  ECG    No new - Personally Reviewed  Physical Exam   GEN: No acute distress.   Neck: No JVD Cardiac: RRR with premature beats, no murmurs, rubs, or gallops.  Respiratory: Clear to auscultation bilaterally. GI: Soft, some tenderness, non-distended  MS: No edema; No deformity. Neuro:  Nonfocal  Psych: Normal affect   Labs    High Sensitivity Troponin:  No results for input(s): "TROPONINIHS" in the last 720 hours.   Chemistry Recent  Labs  Lab 02/07/22 1728 02/08/22 0226 02/09/22 0119  NA 135 137 138  K 3.9 4.2 4.1  CL 100 103 105  CO2 24 23 23   GLUCOSE 127* 135* 122*  BUN 42* 44* 51*  CREATININE 3.52* 3.62* 2.84*  CALCIUM 8.9 8.2* 8.3*  MG  --  2.0 2.3  PROT 7.1 5.8* 5.9*  ALBUMIN 3.4* 2.8* 2.6*  AST 16 14* 14*  ALT 11 10 10   ALKPHOS 60 49 45  BILITOT 1.6* 1.5* 1.2  GFRNONAA 18* 18* 24*  ANIONGAP 11 11 10     Lipids No results for input(s): "CHOL", "TRIG", "HDL", "LABVLDL", "LDLCALC", "CHOLHDL" in the last 168 hours.  Hematology Recent Labs  Lab 02/07/22 1728 02/08/22 0226 02/09/22 0119  WBC 16.9* 13.7* 13.6*  RBC 5.14 4.30 4.06*  HGB 12.5* 10.6* 9.8*  HCT 39.1 32.6* 31.3*  MCV 76.1* 75.8* 77.1*  MCH 24.3* 24.7* 24.1*  MCHC 32.0 32.5 31.3  RDW 17.6* 17.5* 17.5*  PLT 377 294 319   Thyroid No results for input(s): "TSH", "FREET4" in the last 168 hours.  BNP Recent Labs  Lab 02/08/22 0226 02/09/22 0119  BNP 52.1 82.0    DDimer No results for input(s): "DDIMER" in the last 168 hours.   Radiology    DG  Chest Port 1 View  Result Date: 02/08/2022 CLINICAL DATA:  Shortness of breath. EXAM: PORTABLE CHEST 1 VIEW COMPARISON:  07/11/2020 FINDINGS: Subsegmental atelectasis noted at the left lung base. Left lung is clear. The cardio pericardial silhouette is enlarged. Telemetry leads overlie the chest. NG tube tip is positioned in the mid to distal esophagus. Trace lucency noted under the right hemidiaphragm. Telemetry leads overlie the chest. IMPRESSION: 1. NG tube tip is positioned in the mid to distal esophagus. Advancing this 2 15-20 cm should place the tip in the stomach with follow-up x-ray to confirm position at that time. 2. Trace lucency under the right hemidiaphragm. While trace intraperitoneal free air could have this appearance, the patient had a abdomen/pelvis CT about 13 hours ago, demonstrating no intraperitoneal free air that time. Right-side-up decubitus film may prove helpful to further  evaluate as clinically warranted. Electronically Signed   By: Misty Stanley M.D.   On: 02/08/2022 11:20   CT CHEST ABDOMEN PELVIS WO CONTRAST  Result Date: 02/07/2022 CLINICAL DATA:  c/f recurrence strangulated hernia SBO EXAM: CT CHEST, ABDOMEN AND PELVIS WITHOUT CONTRAST TECHNIQUE: Multidetector CT imaging of the chest, abdomen and pelvis was performed following the standard protocol without IV contrast. RADIATION DOSE REDUCTION: This exam was performed according to the departmental dose-optimization program which includes automated exposure control, adjustment of the mA and/or kV according to patient size and/or use of iterative reconstruction technique. COMPARISON:  CT abdomen pelvis 02/06/2022 FINDINGS: CT CHEST FINDINGS Cardiovascular: Prominent heart size. No significant pericardial effusion. The thoracic aorta is normal in caliber. No atherosclerotic plaque of the thoracic aorta. No coronary artery calcifications. Mediastinum/Nodes: No enlarged mediastinal, hilar, or axillary lymph nodes. Thyroid gland, trachea, and esophagus demonstrate no significant findings. Moderate volume hiatal hernia with enteric tube tip terminating in the hiatal hernia. Enteric tube side port at the gastroesophageal junction that is located within the mediastinum given hernia. Lungs/Pleura: Peribronchovascular right middle and right lower lobe ground-glass airspace opacities. Linear atelectasis of the right lower lobe. No pulmonary nodule. No pulmonary mass. No pleural effusion. No pneumothorax. Musculoskeletal: No chest wall abnormality. No suspicious lytic or blastic osseous lesions. No acute displaced fracture. Multilevel degenerative changes of the spine. CT ABDOMEN PELVIS FINDINGS Hepatobiliary: No focal liver abnormality. No gallstones, gallbladder wall thickening, or pericholecystic fluid. No biliary dilatation. Pancreas: Diffusely mildly atrophic. No focal lesion. Otherwise normal pancreatic contour. No surrounding  inflammatory changes. No main pancreatic ductal dilatation. Spleen: Normal in size without focal abnormality. Adrenals/Urinary Tract: No adrenal nodule bilaterally. No nephrolithiasis and no hydronephrosis. Simple appearing parapelvic cysts. Simple fluid dense parenchymal lesion within the kidneys likely represent simple renal cysts. Simple renal cysts, in the absence of clinically indicated signs/symptoms, require no independent follow-up. No ureterolithiasis or hydroureter. The urinary bladder is unremarkable. Excretion of previously administered intravenous contrast noted within bilateral kidneys. Stomach/Bowel: Stomach is within normal limits. Interval increase in proximal mid small bowel fluid dilatation measuring up to 5.1 cm with associated air-fluid levels. Transition point noted at the supraumbilical ventral hernia containing a short loop of small bowel. (3:89). Associated mesenteric fat stranding of the contained loop of bowel within the hernia as well as the mesentery outside of the hernia sac. No pneumatosis. No evidence of large bowel wall thickening or dilatation. Appendix appears normal. Vascular/Lymphatic: No portal venous or mesenteric venous gas. No abdominal aorta or iliac aneurysm. Mild atherosclerotic plaque of the aorta and its branches. No abdominal, pelvic, or inguinal lymphadenopathy. Reproductive: Prostate is unremarkable. Other: No intraperitoneal  free fluid. No intraperitoneal free gas. No organized fluid collection. Musculoskeletal: Trace fat containing right inguinal hernia. Left inguinal hernia repair with mesh with no recurrence. Diffuse subcutaneus soft tissue edema of the anterior abdominal wall surrounding the ventral hernia. No suspicious lytic or blastic osseous lesions. No acute displaced fracture. Multilevel degenerative changes of the spine. Chronic appearing mild L1 anterior wedge compression fracture. IMPRESSION: 1. Interval worsening of small-bowel obstruction due to  incarcerated supraumbilical ventral hernia containing a short loop of small bowel. Mesenteric fat stranding of the associated small bowel is concerning for ischemia; however, there is limited evaluation for associated ischemia due to noncontrast study. No associated bowel perforation. Recommend emergent surgical consultation. 2. Associated right middle and right lower lobe aspiration pneumonia. 3. Moderate volume hiatal hernia with enteric tube tip terminating in the hiatal hernia. Enteric tube side port at the gastroesophageal junction that is located within the mediastinum given hernia. 4. Trace fat containing right inguinal hernia. 5. Left inguinal hernia repair with mesh with no recurrence. 6. Prominent heart size. 7.  Aortic Atherosclerosis (ICD10-I70.0). These results were called by telephone at the time of interpretation on 02/07/2022 at 10:25 pm to provider Georgina Snell , who verbally acknowledged these results. Electronically Signed   By: Iven Finn M.D.   On: 02/07/2022 22:36   DG Abd Portable 1 View  Result Date: 02/07/2022 CLINICAL DATA:  NG placement EXAM: PORTABLE ABDOMEN - 1 VIEW COMPARISON:  CT chest abdomen pelvis 02/07/2022 FINDINGS: Enteric tube noted with tip in the expected region of the gastroesophageal junction-given small hiatal hernia, likely within the gastric lumen. Gaseous dilatation of loops of small bowel in the left upper abdomen. No radio-opaque calculi or other significant radiographic abnormality are seen. Right lower lung zone linear atelectasis versus scarring. Left base atelectasis. IMPRESSION: 1. Enteric tube noted with tip in the expected region of the gastroesophageal junction-given small hiatal hernia, likely within the gastric lumen. Please see separately dictated CT abdomen pelvis 02/07/2022. 2. Small-bowel obstruction. Please see separately dictated CT abdomen pelvis 02/07/2022. Electronically Signed   By: Iven Finn M.D.   On: 02/07/2022 22:14     Cardiac Studies   Echo 09/04/21 IMPRESSIONS     1. Left ventricular ejection fraction, by estimation, is 45 to 50%. The  left ventricle has mildly decreased function. The left ventricle  demonstrates global hypokinesis. The left ventricular internal cavity size  was severely dilated. Left ventricular  diastolic parameters are consistent with Grade I diastolic dysfunction  (impaired relaxation).   2. Right ventricular systolic function is normal. The right ventricular  size is normal. There is normal pulmonary artery systolic pressure.   3. Left atrial size was moderately dilated.   4. The mitral valve is normal in structure. Mild, eccentric, mitral valve  regurgitation. No evidence of mitral stenosis.   5. The aortic valve is tricuspid. Aortic valve regurgitation is not  visualized. No aortic stenosis is present.   6. The inferior vena cava is normal in size with greater than 50%  respiratory variability, suggesting right atrial pressure of 3 mmHg.   Comparison(s): Prior images reviewed side by side. LV size has increased  from 2020-> LVIDd 6.3 cm -> 6.9 cm.   FINDINGS   Left Ventricle: Left ventricular ejection fraction, by estimation, is 45  to 50%. The left ventricle has mildly decreased function. The left  ventricle demonstrates global hypokinesis. The left ventricular internal  cavity size was severely dilated. There  is no left ventricular hypertrophy. Left  ventricular diastolic parameters  are consistent with Grade I diastolic dysfunction (impaired relaxation).   Right Ventricle: The right ventricular size is normal. No increase in  right ventricular wall thickness. Right ventricular systolic function is  normal. There is normal pulmonary artery systolic pressure. The tricuspid  regurgitant velocity is 1.95 m/s, and   with an assumed right atrial pressure of 3 mmHg, the estimated right  ventricular systolic pressure is 29.7 mmHg.   Left Atrium: Left atrial size was  moderately dilated.   Right Atrium: Right atrial size was normal in size.   Pericardium: There is no evidence of pericardial effusion.   Mitral Valve: The mitral valve is normal in structure. Mild mitral valve  regurgitation. No evidence of mitral valve stenosis.   Tricuspid Valve: The tricuspid valve is normal in structure. Tricuspid  valve regurgitation is not demonstrated. No evidence of tricuspid  stenosis.   Aortic Valve: The aortic valve is tricuspid. Aortic valve regurgitation is  not visualized. No aortic stenosis is present.   Pulmonic Valve: The pulmonic valve was normal in structure. Pulmonic valve  regurgitation is not visualized. No evidence of pulmonic stenosis.   Aorta: The aortic root and ascending aorta are structurally normal, with  no evidence of dilitation.   Venous: The inferior vena cava is normal in size with greater than 50%  respiratory variability, suggesting right atrial pressure of 3 mmHg.   IAS/Shunts: No atrial level shunt detected by color flow Doppler.     Patient Profile     67 y.o. male hx of persistent atrial fib, tachycardia medicated CM, CKD, HTN, OSA  who is being seen 02/08/2022 for the evaluation of atrial fib  after admit with strangulated abdominal wall hernia. S/p laparotomy, repair of strangulated incisional ventral hernia with partial omentectomy and small bowel resection on 02/08/2022.   Assessment & Plan    Strangulated abdominal wall hernia.  S/p laparotomy, repair of strangulated incisional ventral hernia with partial omentectomy and small bowel resection on 02/08/2022. Per IM and surgery.  Off eliquis  post op anemia Hgb 9.8 monitor Persistent atrial fib with last DCCV 01/12/22 to SR and is maintaining SR. Continue amiodarone 100 po once able to take PO-. Last DCCV was 01/12/22 - best care to continue eliquis for 4 weeks post DCCV but with more emergent need for surgery we are close to 4 weeks. Would add heparin IV once clear with  surgery.  Chronic combines systolic and diastolic HF - been euvolemic here and is still, but with AKI unable to continue Enestro, eplerenone, ( rash with spironolactone)  lasix  EF had improved to 60% but echo 08/2021 EF 45-50%.   Will attempt more GDMT prior to discharge.  BNP today 82 PCXR AKI per IM see above  Cr. 3.62>>2.84 today Pt with bradycardia  in past monitor closely with prn lopressor OSA does not use CPAP N/V with concern of aspiration PNA on IV zosyn per IM     For questions or updates, please contact [place] Please consult [email or web address] for contact info under        Signed, Cecilie Kicks, NP  02/09/2022, 8:11 AM

## 2022-02-09 NOTE — Evaluation (Signed)
Occupational Therapy Evaluation Patient Details Name: [name] MRN: [id number] DOB: 14-Jan-1956 Today's Date: 02/09/2022   History of Present Illness Patient is a 67 y/o male who presents on 1/14 with abdominal pain. Found to have Strangulated abdominal wall hernia.  S/p laparotomy, repair of strangulated incisional ventral hernia with partial omentectomy and small bowel resection on 02/08/2022. PMH includes CHF, HTN, obesity, A-fib, sleep apnea.   Clinical Impression   Pt reports independence at baseline with ADLs and functional mobility, lives with family who can provide assist at d/c. Pt currently needing min-mod A for ADLs, supervision for bed mobility and transfers without AD. Began education on compensatory strategies for LB ADLs. Pt presenting with impairments listed below, will follow acutely. Anticipate no OT follow up needs at d/c.     Recommendations for follow up therapy are one component of a multi-disciplinary discharge planning process, led by the attending physician.  Recommendations may be updated based on patient status, additional functional criteria and insurance authorization.   Follow Up Recommendations  No OT follow up     Assistance Recommended at Discharge Set up Supervision/Assistance  Patient can return home with the following A little help with walking and/or transfers;A little help with bathing/dressing/bathroom;Assistance with cooking/housework;Help with stairs or ramp for entrance;Assist for transportation    Functional Status Assessment  Patient has had a recent decline in their functional status and demonstrates the ability to make significant improvements in function in a reasonable and predictable amount of time.  Equipment Recommendations  BSC/3in1    Recommendations for Other Services PT consult     Precautions / Restrictions Precautions Precautions: Fall;Other (comment) Precaution Comments: NGT to suction Required Braces or Orthoses: Other  Brace Other Brace: abdominal binder Restrictions Weight Bearing Restrictions: No      Mobility Bed Mobility Overal bed mobility: Needs Assistance Bed Mobility: Rolling, Sidelying to Sit, Sit to Sidelying Rolling: Supervision Sidelying to sit: Supervision, HOB elevated     Sit to sidelying: Supervision, HOB elevated General bed mobility comments: use of log roll technique    Transfers Overall transfer level: Needs assistance Equipment used: 1 person hand held assist Transfers: Sit to/from Stand Sit to Stand: Supervision                  Balance Overall balance assessment: No apparent balance deficits (not formally assessed)                                         ADL either performed or assessed with clinical judgement   ADL Overall ADL's : Needs assistance/impaired Eating/Feeding: NPO Eating/Feeding Details (indicate cue type and reason): has mobility to self feed Grooming: Modified independent   Upper Body Bathing: Minimal assistance   Lower Body Bathing: Moderate assistance   Upper Body Dressing : Minimal assistance   Lower Body Dressing: Moderate assistance   Toilet Transfer: Supervision/safety   Toileting- Clothing Manipulation and Hygiene: Supervision/safety       Functional mobility during ADLs: Supervision/safety       Vision   Vision Assessment?: No apparent visual deficits     Perception Perception Perception Tested?: No   Praxis Praxis Praxis tested?: Not tested    Pertinent Vitals/Pain Pain Assessment Pain Assessment: Faces Pain Score: 2  Faces Pain Scale: Hurts a little bit Pain Location: abdomen Pain Descriptors / Indicators: Sore, Discomfort, Operative site guarding Pain Intervention(s): Limited activity within patient's  tolerance, Monitored during session, Repositioned     Hand Dominance Right   Extremity/Trunk Assessment Upper Extremity Assessment Upper Extremity Assessment: Overall WFL for tasks  assessed   Lower Extremity Assessment Lower Extremity Assessment: Defer to PT evaluation   Cervical / Trunk Assessment Cervical / Trunk Assessment: Normal   Communication Communication Communication: No difficulties   Cognition Arousal/Alertness: Awake/alert Behavior During Therapy: WFL for tasks assessed/performed Overall Cognitive Status: Within Functional Limits for tasks assessed                                       General Comments  VSS on RA    Exercises     Shoulder Instructions      Home Living Family/patient expects to be discharged to:: Private residence Living Arrangements: Spouse/significant other;Children Available Help at Discharge: Family;Available PRN/intermittently Type of Home: House Home Access: Level entry     Home Layout: One level     Bathroom Shower/Tub: Occupational psychologist: Standard     Home Equipment: None          Prior Functioning/Environment Prior Level of Function : Independent/Modified Independent             Mobility Comments: Independent, does landscape work, drives ADLs Comments: independent        OT Problem List: Decreased strength;Decreased range of motion;Decreased activity tolerance;Impaired balance (sitting and/or standing);Decreased cognition;Decreased safety awareness      OT Treatment/Interventions: Self-care/ADL training;Therapeutic exercise;Energy conservation;DME and/or AE instruction;Therapeutic activities;Balance training;Patient/family education    OT Goals(Current goals can be found in the care plan section) Acute Rehab OT Goals Patient Stated Goal: none stated OT Goal Formulation: With patient Time For Goal Achievement: 02/23/22 Potential to Achieve Goals: Good ADL Goals Pt Will Perform Lower Body Dressing: Independently;sit to/from stand Pt Will Transfer to Toilet: Independently;ambulating;regular height toilet Pt Will Perform Tub/Shower Transfer: Shower  transfer;Independently;ambulating  OT Frequency: Min 2X/week    Co-evaluation              AM-PAC OT "6 Clicks" Daily Activity     Outcome Measure Help from another person eating meals?: None (NPO, but has functional ability to self feed) Help from another person taking care of personal grooming?: A Little Help from another person toileting, which includes using toliet, bedpan, or urinal?: A Little Help from another person bathing (including washing, rinsing, drying)?: A Little Help from another person to put on and taking off regular upper body clothing?: A Little Help from another person to put on and taking off regular lower body clothing?: A Little 6 Click Score: 19   End of Session Nurse Communication: Mobility status  Activity Tolerance: Patient tolerated treatment well Patient left: in chair;with call bell/phone within reach  OT Visit Diagnosis: Unsteadiness on feet (R26.81);Other abnormalities of gait and mobility (R26.89);Muscle weakness (generalized) (M62.81)                Time: 8295-6213 OT Time Calculation (min): 18 min Charges:  OT General Charges $OT Visit: 1 Visit OT Evaluation $OT Eval Moderate Complexity: 1 Mod  Insiya Oshea K, OTD, OTR/L SecureChat Preferred Acute Rehab [phone]  Renaye Rakers Koonce 02/09/2022, 12:22 PM

## 2022-02-09 NOTE — Progress Notes (Signed)
Pt removed NG by accident. Pt states that surgeon told him it would come out tomorrow any way and he could start on clear liquids. Pt wanting ocean spray nasal spray to clear his sinus'. PM RN to monitor. Pt aware to let RN know of any nausea or vomiting. Pt resting with call bell within reach.  Will continue to monitor. Payton Emerald, RN

## 2022-02-09 NOTE — Progress Notes (Addendum)
ANTICOAGULATION CONSULT NOTE - Initial Consult  Pharmacy Consult for Eliquis >> IV heparin Indication: atrial fibrillation  Allergies  Allergen Reactions   Ibuprofen Other (See Comments)    Has a-Fib, unable to take   Isosorbide Other (See Comments)    HEADACHES from Imdur   Lisinopril Cough   Digoxin And Related Rash   Spironolactone Rash    Patient Measurements: Height: 6' (182.9 cm) Weight: 114.8 kg (253 lb) IBW/kg (Calculated) : 77.6 Heparin Dosing Weight: 102.3 kg  Vital Signs: Temp: 98.2 F (36.8 C) (01/16 0854) Temp Source: Oral (01/16 0854) BP: 126/69 (01/16 0854) Pulse Rate: 66 (01/16 0854)  Labs: Recent Labs    02/07/22 1728 02/08/22 0226 02/09/22 0119  HGB 12.5* 10.6* 9.8*  HCT 39.1 32.6* 31.3*  PLT 377 294 319  APTT  --  39*  --   LABPROT  --  21.0*  --   INR  --  1.8*  --   CREATININE 3.52* 3.62* 2.84*    Estimated Creatinine Clearance: 33.5 mL/min (A) (by C-G formula based on SCr of 2.84 mg/dL (H)).   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 PMH includes Afib on Eliquis PTA (last dose 1/14) and s/p DCCV in December 2023. CHADS-VASc = ~3. Has been on [facility] for VTE ppx post-op (last dose 1/16 @0640 ). Cleared by surgery to begin IV heparin 1/16 with no bolus. Pharmacy  consulted to dose IV heparin.   Since last dose of Eliquis was 1/14, anticipate will need to dose off aPTT for now but will order baseline HL to confirm. Baseline aPTT 39 seconds, INR 1.8, Hgb down to 9.8 post op.   Goal of Therapy:  Heparin level 0.3-0.7 units/ml aPTT 66-102 seconds Monitor platelets by anticoagulation protocol: Yes   Plan:  No heparin bolus Begin heparin drip at 1500 units/hr (will go ahead and begin now despite last SQH early this AM given high risk as DCCV < 1 mo ago) Check aPTT in 6 hours Daily HL, aPTT, CBC F/u ability to transition back to Eliquis once taking PO's  Dimple Nanas, PharmD, BCPS 02/09/2022 10:14 AM

## 2022-02-09 NOTE — Progress Notes (Signed)
PROGRESS NOTE                                                                                                                                                                                                             Patient Demographics:    [name], is a 67 y.o. male, DOB - 1955/05/24, GQQ:[id number]  Outpatient Primary MD for the patient is [name], MD    LOS - 2  Admit date - 02/07/2022    Chief Complaint  Patient presents with   Abdominal Pain       Brief Narrative (HPI from H&P)    67 year old male with past medical history of atrial fibrillation, CHF, hypertension, lymphocytic colitis, hyperlipidemia, BPH, sleep apnea.  Said that on 4 PM he developed sudden onset of pain abdominal pain over the incision site where they went to remove kidney stones and per patient where they did work for his robotic assisted prostatectomy.  In the ER his workup was consistent with strangulated abdominal wall hernia, general surgery was consulted and he was emergently taken to the OR and hospitalist team was requested to admit the patient.   Subjective:   Patient in bed denies any headache chest pain or abdominal pain, NG tube causing some discomfort, no shortness of breath.  Still not passing flatus or any bowel movements.   Assessment  & Plan :    Strangulated abdominal wall hernia.  S/p laparotomy, repair of strangulated incisional ventral hernia with partial omentectomy and small bowel resection on 02/08/2022. He has been seen by general surgery underwent surgical correction on 02/08/2022, currently has NG tube with no bowel activity, continue IV fluids, supportive care with pain control and nausea medications.  Monitor closely.  Nausea vomiting with suspicion of aspiration pneumonia.  Present on admission.  On IV Zosyn.  Monitor closely.    AKI CKD 3B with baseline creatinine of around 2.  Due to  nausea vomiting, prerenal  azotemia and being on Entresto, hydrate with IV fluids, has Foley catheter placed in OR which will be continued, monitor renal function with hydration.   BPH.  For now Foley, Flomax once taking oral diet.  Chronic systolic heart failure.  EF 45% on TTE done 6 months ago.  Currently dehydrated.  Monitor closely with ongoing IV fluids due to n.p.o. status, ACE ARB's/Entresto on hold  due to AKI.  As needed Lopressor for now. Cards consulted as well.  Persistent atrial fibrillation [place] vas 2 score of 4.  Was on Eliquis at home currently on hold, as needed IV Lopressor.  Cleared by general surgery to start heparin drip without bolus on 02/09/2022.  Cards consulted as well.  OSA.  Nighttime oxygen if needed does not use CPAP.  COPD.  No acute issues.  Supportive care.  HTN.  NPO.  As needed IV Lopressor and hydralazine for now.        Condition - Extremely Guarded  Family Communication  :  Son [name] 02/08/22  Code Status :  Full  Consults  :  CCS, Cards  PUD Prophylaxis : PPI   Procedures  :      Surgeon: Romana Juniper MD FACS - 02/08/22   Procedure performed:  Open primary repair of strangulated incisional ventral hernia, defect 3 x 4 cm Partial omentectomy Small bowel resection      Disposition Plan  :    Status is: Inpatient   DVT Prophylaxis  :    heparin injection 5,000 Units Start: 02/08/22 1400 SCDs Start: 02/07/22 2312   Lab Results  Component Value Date   PLT 319 02/09/2022    Diet :  Diet Order             Diet NPO time specified  Diet effective now                    Inpatient Medications  Scheduled Meds:  heparin  5,000 Units Subcutaneous Q8H   pantoprazole (PROTONIX) IV  40 mg Intravenous Daily   Continuous Infusions:  dextrose 5 % and 0.45 % NaCl with KCl 20 mEq/L     methocarbamol (ROBAXIN) IV     piperacillin-tazobactam (ZOSYN)  IV 3.375 g (02/09/22 0640)   PRN Meds:.acetaminophen **OR** acetaminophen, fentaNYL (SUBLIMAZE)  injection, hydrALAZINE, HYDROmorphone (DILAUDID) injection, methocarbamol (ROBAXIN) IV, metoprolol tartrate, ondansetron (ZOFRAN) IV, prochlorperazine  Antibiotics  :    Anti-infectives (From admission, onward)    Start     Dose/Rate Route Frequency Ordered Stop   02/08/22 0200  piperacillin-tazobactam (ZOSYN) IVPB 3.375 g        3.375 g 12.5 mL/hr over 240 Minutes Intravenous Every 8 hours 02/07/22 1933     02/07/22 2300  vancomycin (VANCOREADY) IVPB 1500 mg/300 mL        1,500 mg 150 mL/hr over 120 Minutes Intravenous  Once 02/07/22 2249 02/08/22 0157   02/07/22 1945  piperacillin-tazobactam (ZOSYN) IVPB 3.375 g        3.375 g 100 mL/hr over 30 Minutes Intravenous  Once 02/07/22 1931 02/07/22 2044         Objective:   Vitals:   02/08/22 2010 02/08/22 2300 02/09/22 0322 02/09/22 0854  BP: 100/66 (!) 105/59 116/74 126/69  Pulse: 63 62 66 66  Resp: 15 14 14 14   Temp: 98.2 F (36.8 C) 98 F (36.7 C) 98.2 F (36.8 C) 98.2 F (36.8 C)  TempSrc: Oral Axillary Oral Oral  SpO2: 94% 94% 94% 93%  Weight:      Height:        Wt Readings from Last 3 Encounters:  02/07/22 114.8 kg  02/06/22 114.8 kg  01/27/22 114.9 kg     Intake/Output Summary (Last 24 hours) at 02/09/2022 1010 Last data filed at 02/09/2022 0644 Gross per 24 hour  Intake 290 ml  Output 1200 ml  Net -910 ml  Physical Exam  Awake Alert, No new F.N deficits, NG, Foley Cache.AT,PERRAL Supple Neck, No JVD,   Symmetrical Chest wall movement, CTAB RRR,No Gallops,Rubs or new Murmurs,  No B. Sounds, Abd Soft,  No Cyanosis, Clubbing or edema       Data Review:    Recent Labs  Lab 02/06/22 1129 02/06/22 1143 02/07/22 1728 02/08/22 0226 02/09/22 0119  WBC 17.0*  --  16.9* 13.7* 13.6*  HGB 12.8* 14.3 12.5* 10.6* 9.8*  HCT 39.7 42.0 39.1 32.6* 31.3*  PLT 390  --  377 294 319  MCV 75.8*  --  76.1* 75.8* 77.1*  MCH 24.4*  --  24.3* 24.7* 24.1*  MCHC 32.2  --  32.0 32.5 31.3  RDW 17.2*  --  17.6*  17.5* 17.5*  LYMPHSABS 0.6*  --  0.7 0.3* 0.5*  MONOABS 1.2*  --  1.1* 0.8 1.0  EOSABS 0.0  --  0.0 0.0 0.0  BASOSABS 0.0  --  0.0 0.0 0.0    Recent Labs  Lab 02/06/22 1129 02/06/22 1143 02/06/22 1351 02/07/22 1728 02/07/22 1953 02/08/22 0226 02/09/22 0119  NA 136 138  --  135  --  137 138  K 3.8 3.9  --  3.9  --  4.2 4.1  CL 101 102  --  100  --  103 105  CO2 24  --   --  24  --  23 23  ANIONGAP 11  --   --  11  --  11 10  GLUCOSE 121* 124*  --  127*  --  135* 122*  BUN 20 23  --  42*  --  44* 51*  CREATININE 1.66* 1.70*  --  3.52*  --  3.62* 2.84*  AST 22  --   --  16  --  14* 14*  ALT 14  --   --  11  --  10 10  ALKPHOS 73  --   --  60  --  49 45  BILITOT 1.3*  --   --  1.6*  --  1.5* 1.2  ALBUMIN 3.9  --   --  3.4*  --  2.8* 2.6*  LATICACIDVEN  --   --  1.5 1.4 1.2  --   --   INR  --   --   --   --   --  1.8*  --   BNP  --   --   --   --   --  52.1 82.0  MG  --   --   --   --   --  2.0 2.3  CALCIUM 9.2  --   --  8.9  --  8.2* 8.3*     Radiology Reports DG Chest Port 1 View  Result Date: 02/08/2022 CLINICAL DATA:  Shortness of breath. EXAM: PORTABLE CHEST 1 VIEW COMPARISON:  07/11/2020 FINDINGS: Subsegmental atelectasis noted at the left lung base. Left lung is clear. The cardio pericardial silhouette is enlarged. Telemetry leads overlie the chest. NG tube tip is positioned in the mid to distal esophagus. Trace lucency noted under the right hemidiaphragm. Telemetry leads overlie the chest. IMPRESSION: 1. NG tube tip is positioned in the mid to distal esophagus. Advancing this 2 15-20 cm should place the tip in the stomach with follow-up x-ray to confirm position at that time. 2. Trace lucency under the right hemidiaphragm. While trace intraperitoneal free air could have this appearance, the patient had a abdomen/pelvis CT about 13 hours  ago, demonstrating no intraperitoneal free air that time. Right-side-up decubitus film may prove helpful to further evaluate as clinically  warranted. Electronically Signed   By: Misty Stanley M.D.   On: 02/08/2022 11:20   CT CHEST ABDOMEN PELVIS WO CONTRAST  Result Date: 02/07/2022 CLINICAL DATA:  c/f recurrence strangulated hernia SBO EXAM: CT CHEST, ABDOMEN AND PELVIS WITHOUT CONTRAST TECHNIQUE: Multidetector CT imaging of the chest, abdomen and pelvis was performed following the standard protocol without IV contrast. RADIATION DOSE REDUCTION: This exam was performed according to the departmental dose-optimization program which includes automated exposure control, adjustment of the mA and/or kV according to patient size and/or use of iterative reconstruction technique. COMPARISON:  CT abdomen pelvis 02/06/2022 FINDINGS: CT CHEST FINDINGS Cardiovascular: Prominent heart size. No significant pericardial effusion. The thoracic aorta is normal in caliber. No atherosclerotic plaque of the thoracic aorta. No coronary artery calcifications. Mediastinum/Nodes: No enlarged mediastinal, hilar, or axillary lymph nodes. Thyroid gland, trachea, and esophagus demonstrate no significant findings. Moderate volume hiatal hernia with enteric tube tip terminating in the hiatal hernia. Enteric tube side port at the gastroesophageal junction that is located within the mediastinum given hernia. Lungs/Pleura: Peribronchovascular right middle and right lower lobe ground-glass airspace opacities. Linear atelectasis of the right lower lobe. No pulmonary nodule. No pulmonary mass. No pleural effusion. No pneumothorax. Musculoskeletal: No chest wall abnormality. No suspicious lytic or blastic osseous lesions. No acute displaced fracture. Multilevel degenerative changes of the spine. CT ABDOMEN PELVIS FINDINGS Hepatobiliary: No focal liver abnormality. No gallstones, gallbladder wall thickening, or pericholecystic fluid. No biliary dilatation. Pancreas: Diffusely mildly atrophic. No focal lesion. Otherwise normal pancreatic contour. No surrounding inflammatory changes. No  main pancreatic ductal dilatation. Spleen: Normal in size without focal abnormality. Adrenals/Urinary Tract: No adrenal nodule bilaterally. No nephrolithiasis and no hydronephrosis. Simple appearing parapelvic cysts. Simple fluid dense parenchymal lesion within the kidneys likely represent simple renal cysts. Simple renal cysts, in the absence of clinically indicated signs/symptoms, require no independent follow-up. No ureterolithiasis or hydroureter. The urinary bladder is unremarkable. Excretion of previously administered intravenous contrast noted within bilateral kidneys. Stomach/Bowel: Stomach is within normal limits. Interval increase in proximal mid small bowel fluid dilatation measuring up to 5.1 cm with associated air-fluid levels. Transition point noted at the supraumbilical ventral hernia containing a short loop of small bowel. (3:89). Associated mesenteric fat stranding of the contained loop of bowel within the hernia as well as the mesentery outside of the hernia sac. No pneumatosis. No evidence of large bowel wall thickening or dilatation. Appendix appears normal. Vascular/Lymphatic: No portal venous or mesenteric venous gas. No abdominal aorta or iliac aneurysm. Mild atherosclerotic plaque of the aorta and its branches. No abdominal, pelvic, or inguinal lymphadenopathy. Reproductive: Prostate is unremarkable. Other: No intraperitoneal free fluid. No intraperitoneal free gas. No organized fluid collection. Musculoskeletal: Trace fat containing right inguinal hernia. Left inguinal hernia repair with mesh with no recurrence. Diffuse subcutaneus soft tissue edema of the anterior abdominal wall surrounding the ventral hernia. No suspicious lytic or blastic osseous lesions. No acute displaced fracture. Multilevel degenerative changes of the spine. Chronic appearing mild L1 anterior wedge compression fracture. IMPRESSION: 1. Interval worsening of small-bowel obstruction due to incarcerated supraumbilical  ventral hernia containing a short loop of small bowel. Mesenteric fat stranding of the associated small bowel is concerning for ischemia; however, there is limited evaluation for associated ischemia due to noncontrast study. No associated bowel perforation. Recommend emergent surgical consultation. 2. Associated right middle and right lower lobe aspiration pneumonia.  3. Moderate volume hiatal hernia with enteric tube tip terminating in the hiatal hernia. Enteric tube side port at the gastroesophageal junction that is located within the mediastinum given hernia. 4. Trace fat containing right inguinal hernia. 5. Left inguinal hernia repair with mesh with no recurrence. 6. Prominent heart size. 7.  Aortic Atherosclerosis (ICD10-I70.0). These results were called by telephone at the time of interpretation on 02/07/2022 at 10:25 pm to provider Georgina Snell , who verbally acknowledged these results. Electronically Signed   By: Iven Finn M.D.   On: 02/07/2022 22:36   DG Abd Portable 1 View  Result Date: 02/07/2022 CLINICAL DATA:  NG placement EXAM: PORTABLE ABDOMEN - 1 VIEW COMPARISON:  CT chest abdomen pelvis 02/07/2022 FINDINGS: Enteric tube noted with tip in the expected region of the gastroesophageal junction-given small hiatal hernia, likely within the gastric lumen. Gaseous dilatation of loops of small bowel in the left upper abdomen. No radio-opaque calculi or other significant radiographic abnormality are seen. Right lower lung zone linear atelectasis versus scarring. Left base atelectasis. IMPRESSION: 1. Enteric tube noted with tip in the expected region of the gastroesophageal junction-given small hiatal hernia, likely within the gastric lumen. Please see separately dictated CT abdomen pelvis 02/07/2022. 2. Small-bowel obstruction. Please see separately dictated CT abdomen pelvis 02/07/2022. Electronically Signed   By: Iven Finn M.D.   On: 02/07/2022 22:14   CT ABDOMEN PELVIS W  CONTRAST  Result Date: 02/06/2022 CLINICAL DATA:  Hernia suspected, abdominal wall large, hardened skin with overlying warmth. Not reproducible. EXAM: CT ABDOMEN AND PELVIS WITH CONTRAST TECHNIQUE: Multidetector CT imaging of the abdomen and pelvis was performed using the standard protocol following bolus administration of intravenous contrast. RADIATION DOSE REDUCTION: This exam was performed according to the departmental dose-optimization program which includes automated exposure control, adjustment of the mA and/or kV according to patient size and/or use of iterative reconstruction technique. CONTRAST:  14mL OMNIPAQUE IOHEXOL 350 MG/ML SOLN COMPARISON:  07/31/2018 FINDINGS: Lower chest: Mild right basilar atelectasis. Moderate hiatal hernia. Hepatobiliary: No focal liver abnormality is seen. Subtle hyperdensity in the gallbladder could reflect biliary sludge. No hyperdense gallstone. No evidence of wall thickening or pericholecystic inflammatory changes. No biliary dilatation. Pancreas: Unremarkable. No pancreatic ductal dilatation or surrounding inflammatory changes. Spleen: Normal in size without focal abnormality. Adrenals/Urinary Tract: Unremarkable adrenal glands. Multiple bilateral renal cysts including renal sinus cysts, not appreciably changed and do not require dedicated follow-up imaging. No renal stone or evidence of hydronephrosis. Urinary bladder within normal limits. Stomach/Bowel: Moderate-sized hiatal hernia. Stomach is otherwise within normal limits. Small-bowel obstruction secondary to a supraumbilical abdominal wall hernia containing small bowel which is abnormal in appearance. Bowel within the hernia sac is fluid-filled with mucosal hyperenhancement. Surrounding fat stranding. There is dilation of bowel upstream to the hernia. The small bowel distal to the hernia is decompressed. No focal colonic wall thickening or inflammatory changes. Vascular/Lymphatic: No significant vascular findings  are present. No enlarged abdominal or pelvic lymph nodes. Reproductive: Prostate is unremarkable. Other: Prominent fat stranding within the subcutaneous soft tissues surrounding the supraumbilical hernia. No ascites. No pneumoperitoneum. Prior left inguinal hernia repair. Small fat containing right inguinal hernia. Musculoskeletal: No acute osseous abnormality. IMPRESSION: 1. Small-bowel obstruction secondary to a supraumbilical abdominal wall hernia containing small bowel. Distended small bowel within the hernia sac is fluid-filled with mucosal hyperenhancement and surrounding fat stranding. Findings are concerning for strangulation. Surgical consultation is recommended. 2. Moderate-sized hiatal hernia. 3. Subtle hyperdensity in the gallbladder could reflect biliary sludge.  No evidence of acute cholecystitis. 4. Small fat containing right inguinal hernia. These results were called by telephone at the time of interpretation on 02/06/2022 at 2:01 pm to provider Dr. Rondel Oh, who verbally acknowledged these results. Electronically Signed   By: Davina Poke D.O.   On: 02/06/2022 14:06      Signature  -   Lala Lund M.D on 02/09/2022 at 10:10 AM   -  To page go to [email or web address]

## 2022-02-09 NOTE — Anesthesia Postprocedure Evaluation (Addendum)
Anesthesia Post Note  Patient: GORGE ALMANZA  Procedure(s) Performed: EXPLORATORY LAPAROTOMY (Abdomen) SMALL BOWEL RESECTION REPAIR INCARCERATED VENTRAL  HERNIA     Patient location during evaluation: PACU Anesthesia Type: General Level of consciousness: awake and alert Pain management: pain level controlled Vital Signs Assessment: post-procedure vital signs reviewed and stable Respiratory status: spontaneous breathing, nonlabored ventilation, respiratory function stable and patient connected to nasal cannula oxygen Cardiovascular status: blood pressure returned to baseline and stable Postop Assessment: no apparent nausea or vomiting Anesthetic complications: no   No notable events documented.  Last Vitals:  Vitals:   02/09/22 0322 02/09/22 0854  BP: 116/74 126/69  Pulse: 66 66  Resp: 14 14  Temp: 36.8 C 36.8 C  SpO2: 94% 93%    Last Pain:  Vitals:   02/09/22 1129  TempSrc:   PainSc: 0-No pain                 Onie Kasparek S

## 2022-02-09 NOTE — Progress Notes (Addendum)
ANTICOAGULATION CONSULT NOTE - follow-up  Pharmacy Consult for Eliquis >> IV heparin Indication: atrial fibrillation  Allergies  Allergen Reactions   Ibuprofen Other (See Comments)    Has a-Fib, unable to take   Isosorbide Other (See Comments)    HEADACHES from Imdur   Lisinopril Cough   Digoxin And Related Rash   Spironolactone Rash    Patient Measurements: Height: 6' (182.9 cm) Weight: 114.8 kg (253 lb) IBW/kg (Calculated) : 77.6 Heparin Dosing Weight: 102.3 kg  Vital Signs: Temp: 98.1 F (36.7 C) (01/16 1933) Temp Source: Oral (01/16 1933) BP: 129/68 (01/16 1933) Pulse Rate: 70 (01/16 1933)  Labs: Recent Labs    02/07/22 1728 02/08/22 0226 02/09/22 0119 02/09/22 1032 02/09/22 1808  HGB 12.5* 10.6* 9.8*  --   --   HCT 39.1 32.6* 31.3*  --   --   PLT 377 294 319  --   --   APTT  --  39*  --   --  46*  LABPROT  --  21.0*  --   --   --   INR  --  1.8*  --   --   --   HEPARINUNFRC  --   --   --  >1.10*  --   CREATININE 3.52* 3.62* 2.84*  --   --      Estimated Creatinine Clearance: 33.5 mL/min (A) (by C-G formula based on SCr of 2.84 mg/dL (H)).   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 PMH includes Afib on Eliquis PTA (last dose 1/14) and s/p DCCV in December 2023.  CHADS-VASc = ~3. Has been on [facility] for VTE ppx post-op (last dose 1/16 @0640 ). Cleared by surgery to begin IV heparin 1/16 with no bolus. Pharmacy consulted to dose IV heparin.   Since last dose of Eliquis was 1/14, anticipate will need to dose off aPTT for now but will order baseline HL to confirm. Baseline aPTT 39 seconds, INR 1.8, Hgb down to 9.8 post op.   02/09/2022 PM update: aPTT 46 seconds (subtherapeutic) No signs of bleeding No issues with infusion   Goal of Therapy:  Heparin level 0.3-0.7 units/ml aPTT 66-102 seconds Monitor platelets by anticoagulation protocol: Yes   Plan:  Increase  heparin drip to 1850 units/hr Daily HL, aPTT, CBC F/u ability to transition back to Eliquis once taking PO's  Liller Yohn BS, PharmD, BCPS Clinical Pharmacist 02/09/2022 7:40 PM  Contact: [phone] after 3 PM  "Be curious, not judgmental..." -Jamal Maes

## 2022-02-10 DIAGNOSIS — I5042 Chronic combined systolic (congestive) and diastolic (congestive) heart failure: Secondary | ICD-10-CM | POA: Diagnosis not present

## 2022-02-10 DIAGNOSIS — K430 Incisional hernia with obstruction, without gangrene: Secondary | ICD-10-CM | POA: Diagnosis not present

## 2022-02-10 DIAGNOSIS — I4819 Other persistent atrial fibrillation: Secondary | ICD-10-CM | POA: Diagnosis not present

## 2022-02-10 LAB — APTT
aPTT: 57 seconds — ABNORMAL HIGH (ref 24–36)
aPTT: 66 seconds — ABNORMAL HIGH (ref 24–36)
aPTT: 74 seconds — ABNORMAL HIGH (ref 24–36)

## 2022-02-10 LAB — CBC WITH DIFFERENTIAL/PLATELET
Abs Immature Granulocytes: 0.05 10*3/uL (ref 0.00–0.07)
Basophils Absolute: 0 10*3/uL (ref 0.0–0.1)
Basophils Relative: 0 %
Eosinophils Absolute: 0 10*3/uL (ref 0.0–0.5)
Eosinophils Relative: 0 %
HCT: 32.5 % — ABNORMAL LOW (ref 39.0–52.0)
Hemoglobin: 10.3 g/dL — ABNORMAL LOW (ref 13.0–17.0)
Immature Granulocytes: 1 %
Lymphocytes Relative: 8 %
Lymphs Abs: 0.7 10*3/uL (ref 0.7–4.0)
MCH: 24.2 pg — ABNORMAL LOW (ref 26.0–34.0)
MCHC: 31.7 g/dL (ref 30.0–36.0)
MCV: 76.3 fL — ABNORMAL LOW (ref 80.0–100.0)
Monocytes Absolute: 1 10*3/uL (ref 0.1–1.0)
Monocytes Relative: 11 %
Neutro Abs: 7.3 10*3/uL (ref 1.7–7.7)
Neutrophils Relative %: 80 %
Platelets: 362 10*3/uL (ref 150–400)
RBC: 4.26 MIL/uL (ref 4.22–5.81)
RDW: 17.5 % — ABNORMAL HIGH (ref 11.5–15.5)
WBC: 9.1 10*3/uL (ref 4.0–10.5)
nRBC: 0 % (ref 0.0–0.2)

## 2022-02-10 LAB — MAGNESIUM: Magnesium: 2.3 mg/dL (ref 1.7–2.4)

## 2022-02-10 LAB — HEPARIN LEVEL (UNFRACTIONATED): Heparin Unfractionated: >1.1 IU/mL — ABNORMAL HIGH (ref 0.30–0.70)

## 2022-02-10 LAB — COMPREHENSIVE METABOLIC PANEL
ALT: 11 U/L (ref 0–44)
AST: 14 U/L — ABNORMAL LOW (ref 15–41)
Albumin: 2.8 g/dL — ABNORMAL LOW (ref 3.5–5.0)
Alkaline Phosphatase: 41 U/L (ref 38–126)
Anion gap: 9 (ref 5–15)
BUN: 41 mg/dL — ABNORMAL HIGH (ref 8–23)
CO2: 23 mmol/L (ref 22–32)
Calcium: 8.5 mg/dL — ABNORMAL LOW (ref 8.9–10.3)
Chloride: 108 mmol/L (ref 98–111)
Creatinine, Ser: 2.16 mg/dL — ABNORMAL HIGH (ref 0.61–1.24)
GFR, Estimated: 33 mL/min — ABNORMAL LOW (ref >60)
Glucose, Bld: 98 mg/dL (ref 70–99)
Potassium: 3.9 mmol/L (ref 3.5–5.1)
Sodium: 140 mmol/L (ref 135–145)
Total Bilirubin: 1.1 mg/dL (ref 0.3–1.2)
Total Protein: 6.5 g/dL (ref 6.5–8.1)

## 2022-02-10 LAB — BRAIN NATRIURETIC PEPTIDE: B Natriuretic Peptide: 82.6 pg/mL (ref 0.0–100.0)

## 2022-02-10 MED ORDER — KCL IN DEXTROSE-NACL 20-5-0.45 MEQ/L-%-% IV SOLN
INTRAVENOUS | Status: DC
  Filled 2022-02-10: qty 1000

## 2022-02-10 MED ORDER — OXYCODONE HCL 5 MG PO TABS
5.0000 mg | ORAL_TABLET | ORAL | Status: DC | PRN
  Administered 2022-02-10: 5 mg via ORAL
  Filled 2022-02-10: qty 1

## 2022-02-10 MED ORDER — ACETAMINOPHEN 325 MG PO TABS
650.0000 mg | ORAL_TABLET | Freq: Four times a day (QID) | ORAL | Status: DC
  Administered 2022-02-10 – 2022-02-12 (×6): 650 mg via ORAL
  Filled 2022-02-10 (×7): qty 2

## 2022-02-10 MED ORDER — BACLOFEN 5 MG HALF TABLET: 5.0000 mg | ORAL_TABLET | Freq: Three times a day (TID) | ORAL | Status: DC | PRN

## 2022-02-10 MED ORDER — BENZONATATE 100 MG PO CAPS
200.0000 mg | ORAL_CAPSULE | Freq: Three times a day (TID) | ORAL | Status: DC | PRN
  Administered 2022-02-10 – 2022-02-12 (×4): 200 mg via ORAL
  Filled 2022-02-10 (×4): qty 2

## 2022-02-10 NOTE — Progress Notes (Signed)
Progress Note  3 Days Post-Op  Subjective: Pt reports pain overall well controlled. Having bowel function, NGT came out overnight.    Objective: Vital signs in last 24 hours: Temp:  [97.7 F (36.5 C)-98.4 F (36.9 C)] 97.7 F (36.5 C) (01/17 0838) Pulse Rate:  [62-70] 62 (01/17 0426) Resp:  [15-19] 17 (01/17 0838) BP: (125-141)/(68-86) 125/79 (01/17 0838) SpO2:  [91 %-96 %] 95 % (01/17 0838) Last BM Date :  (PTA)  Intake/Output from previous day: 01/16 0701 - 01/17 0700 In: 2722.7 [I.V.:1310.4; IV Piggyback:237.3] Out: -  Intake/Output this shift: No intake/output data recorded.  PE: General: pleasant, WD, obese male who is sitting in chair in NAD Heart: regular, rate, and rhythm.   Lungs: Respiratory effort nonlabored Abd: soft, appropriately ttp, honeycomb to midline incision, mild distention  Psych: A&Ox3 with an appropriate affect.    Lab Results:  Recent Labs    02/09/22 0119 02/10/22 0137  WBC 13.6* 9.1  HGB 9.8* 10.3*  HCT 31.3* 32.5*  PLT 319 362    BMET Recent Labs    02/09/22 0119 02/10/22 0137  NA 138 140  K 4.1 3.9  CL 105 108  CO2 23 23  GLUCOSE 122* 98  BUN 51* 41*  CREATININE 2.84* 2.16*  CALCIUM 8.3* 8.5*    PT/INR Recent Labs    02/08/22 0226  LABPROT 21.0*  INR 1.8*    CMP     Component Value Date/Time   NA 140 02/10/2022 0137   NA 140 02/03/2021 1526   K 3.9 02/10/2022 0137   CL 108 02/10/2022 0137   CO2 23 02/10/2022 0137   GLUCOSE 98 02/10/2022 0137   BUN 41 (H) 02/10/2022 0137   BUN 22 02/03/2021 1526   CREATININE 2.16 (H) 02/10/2022 0137   CALCIUM 8.5 (L) 02/10/2022 0137   PROT 6.5 02/10/2022 0137   PROT 6.7 01/20/2021 1557   ALBUMIN 2.8 (L) 02/10/2022 0137   ALBUMIN 4.0 01/20/2021 1557   AST 14 (L) 02/10/2022 0137   ALT 11 02/10/2022 0137   ALKPHOS 41 02/10/2022 0137   BILITOT 1.1 02/10/2022 0137   BILITOT 0.5 01/20/2021 1557   GFRNONAA 33 (L) 02/10/2022 0137   GFRAA 52 (L) 09/21/2019 1658   Lipase      Component Value Date/Time   LIPASE 36 02/06/2022 1129       Studies/Results: DG Chest Port 1 View  Result Date: 02/08/2022 CLINICAL DATA:  Shortness of breath. EXAM: PORTABLE CHEST 1 VIEW COMPARISON:  07/11/2020 FINDINGS: Subsegmental atelectasis noted at the left lung base. Left lung is clear. The cardio pericardial silhouette is enlarged. Telemetry leads overlie the chest. NG tube tip is positioned in the mid to distal esophagus. Trace lucency noted under the right hemidiaphragm. Telemetry leads overlie the chest. IMPRESSION: 1. NG tube tip is positioned in the mid to distal esophagus. Advancing this 2 15-20 cm should place the tip in the stomach with follow-up x-ray to confirm position at that time. 2. Trace lucency under the right hemidiaphragm. While trace intraperitoneal free air could have this appearance, the patient had a abdomen/pelvis CT about 13 hours ago, demonstrating no intraperitoneal free air that time. Right-side-up decubitus film may prove helpful to further evaluate as clinically warranted. Electronically Signed   By: Misty Stanley M.D.   On: 02/08/2022 11:20    Anti-infectives: Anti-infectives (From admission, onward)    Start     Dose/Rate Route Frequency Ordered Stop   02/08/22 0200  piperacillin-tazobactam (ZOSYN) IVPB  3.375 g        3.375 g 12.5 mL/hr over 240 Minutes Intravenous Every 8 hours 02/07/22 1933     02/07/22 2300  vancomycin (VANCOREADY) IVPB 1500 mg/300 mL        1,500 mg 150 mL/hr over 120 Minutes Intravenous  Once 02/07/22 2249 02/08/22 0157   02/07/22 1945  piperacillin-tazobactam (ZOSYN) IVPB 3.375 g        3.375 g 100 mL/hr over 30 Minutes Intravenous  Once 02/07/22 1931 02/07/22 2044        Assessment/Plan  POD2 s/p  open primary repair of strangulated incisional ventral hernia, partial omentectomy, small bowel resection 02/08/22 Dr. Kae Heller - NGT out and having bowel function - ok to start CLD today  - work on PO pain control  -  mobilize, PT/OT - binder when OOB  FEN: NPO, IVF, NGT to LIWS VTE: SCDs, hep gtt ID: [id number] 1/14>> continue through POD5  - below per TRH -  Atrial fibrillation on Eliquis CHF HTN Lymphocytic colitis  HLD BPH OSA  LOS: 3 days    Norm Parcel, [facility] 02/10/2022, 10:41 AM Please see Amion for pager number during day hours 7:00am-4:30pm

## 2022-02-10 NOTE — Progress Notes (Signed)
PROGRESS NOTE                                                                                                                                                                                                             Patient Demographics:    [name], is a 67 y.o. male, DOB - 06-25-1955, ZDG:[id number]  Outpatient Primary MD for the patient is [name], MD    LOS - 3  Admit date - 02/07/2022    Chief Complaint  Patient presents with   Abdominal Pain       Brief Narrative (HPI from H&P)    67 year old male with past medical history of atrial fibrillation, CHF, hypertension, lymphocytic colitis, hyperlipidemia, BPH, sleep apnea.  Said that on 4 PM he developed sudden onset of pain abdominal pain over the incision site where they went to remove kidney stones and per patient where they did work for his robotic assisted prostatectomy.  In the ER his workup was consistent with strangulated abdominal wall hernia, general surgery was consulted and he was emergently taken to the OR and hospitalist team was requested to admit the patient.   Subjective:   Patient sitting in recliner, denies any headache, no chest or abdominal pain, passing flatus.  No nausea.   Assessment  & Plan :    Strangulated abdominal wall hernia.  S/p laparotomy, repair of strangulated incisional ventral hernia with partial omentectomy and small bowel resection on 02/08/2022. He has been seen by general surgery underwent surgical correction on 02/08/2022, NG tube fell out by itself night of 02/09/2022 however patient is not nauseated, is passing flatus now, defer advance meant of diet to general surgery, continue IV fluids at a gentle rate and monitor closely, supportive care with pain control and nausea medications.     Nausea vomiting with suspicion of aspiration pneumonia.  Present on admission.  On IV Zosyn.  Monitor closely.    AKI CKD 3B with  baseline creatinine of around 2.  Due to  nausea vomiting, prerenal azotemia and being on Entresto, hydrate with IV fluids, has Foley catheter placed in OR which will be continued, monitor renal function with hydration.   BPH.  For now Foley, Flomax once taking oral diet.  Chronic systolic heart failure.  EF 45% on TTE done 6 months ago.  Currently dehydrated.  Monitor  closely with ongoing IV fluids due to n.p.o. status, ACE ARB's/Entresto on hold due to AKI.  As needed Lopressor for now. Cards consulted as well.  Persistent atrial fibrillation [place] vas 2 score of 4.  Was on Eliquis at home currently on hold, as needed IV Lopressor.  Cleared by general surgery to start heparin drip without bolus on 02/09/2022.  Cards consulted as well.  OSA.  Nighttime oxygen if needed does not use CPAP.  COPD.  No acute issues.  Supportive care.  HTN.  NPO.  As needed IV Lopressor and hydralazine for now.        Condition - Extremely Guarded  Family Communication  :  Son [name] 02/08/22  Code Status :  Full  Consults  :  CCS, Cards  PUD Prophylaxis : PPI   Procedures  :      Surgeon: Romana Juniper MD FACS - 02/08/22   Procedure performed:  Open primary repair of strangulated incisional ventral hernia, defect 3 x 4 cm Partial omentectomy Small bowel resection      Disposition Plan  :    Status is: Inpatient   DVT Prophylaxis  :  Hep gtt  SCDs Start: 02/07/22 2312   Lab Results  Component Value Date   PLT 362 02/10/2022    Diet :  Diet Order             Diet NPO time specified  Diet effective now                    Inpatient Medications  Scheduled Meds:  pantoprazole (PROTONIX) IV  40 mg Intravenous Daily   Continuous Infusions:  dextrose 5 % and 0.45 % NaCl with KCl 20 mEq/L 75 mL/hr at 02/10/22 0407   heparin 2,000 Units/hr (02/10/22 0308)   methocarbamol (ROBAXIN) IV 500 mg (02/09/22 2012)   piperacillin-tazobactam (ZOSYN)  IV 3.375 g (02/10/22 0529)   PRN  Meds:.acetaminophen **OR** acetaminophen, fentaNYL (SUBLIMAZE) injection, hydrALAZINE, HYDROmorphone (DILAUDID) injection, methocarbamol (ROBAXIN) IV, metoprolol tartrate, ondansetron (ZOFRAN) IV, prochlorperazine, sodium chloride    Objective:   Vitals:   02/09/22 1700 02/09/22 1933 02/10/22 0030 02/10/22 0426  BP: 137/79 129/68 133/74 (!) 141/86  Pulse:  70 68 62  Resp: 19 15 19 17   Temp: 98.4 F (36.9 C) 98.1 F (36.7 C) 98.3 F (36.8 C) 97.9 F (36.6 C)  TempSrc: Oral Oral Oral Oral  SpO2:  91% 91% 96%  Weight:      Height:        Wt Readings from Last 3 Encounters:  02/07/22 114.8 kg  02/06/22 114.8 kg  01/27/22 114.9 kg     Intake/Output Summary (Last 24 hours) at 02/10/2022 9767 Last data filed at 02/10/2022 0529 Gross per 24 hour  Intake 2722.67 ml  Output --  Net 2722.67 ml     Physical Exam  Awake Alert, No new F.N deficits,   [name].AT,PERRAL Supple Neck, No JVD,   Symmetrical Chest wall movement, CTAB RRR,No Gallops,Rubs or new Murmurs,  few B. Sounds, Abd Soft,  No Cyanosis, Clubbing or edema       Data Review:    Recent Labs  Lab 02/06/22 1129 02/06/22 1143 02/07/22 1728 02/08/22 0226 02/09/22 0119 02/10/22 0137  WBC 17.0*  --  16.9* 13.7* 13.6* 9.1  HGB 12.8* 14.3 12.5* 10.6* 9.8* 10.3*  HCT 39.7 42.0 39.1 32.6* 31.3* 32.5*  PLT 390  --  377 294 319 362  MCV 75.8*  --  76.1* 75.8*  77.1* 76.3*  MCH 24.4*  --  24.3* 24.7* 24.1* 24.2*  MCHC 32.2  --  32.0 32.5 31.3 31.7  RDW 17.2*  --  17.6* 17.5* 17.5* 17.5*  LYMPHSABS 0.6*  --  0.7 0.3* 0.5* 0.7  MONOABS 1.2*  --  1.1* 0.8 1.0 1.0  EOSABS 0.0  --  0.0 0.0 0.0 0.0  BASOSABS 0.0  --  0.0 0.0 0.0 0.0    Recent Labs  Lab 02/06/22 1129 02/06/22 1143 02/06/22 1351 02/07/22 1728 02/07/22 1953 02/08/22 0226 02/09/22 0119 02/10/22 0137  NA 136 138  --  135  --  137 138 140  K 3.8 3.9  --  3.9  --  4.2 4.1 3.9  CL 101 102  --  100  --  103 105 108  CO2 24  --   --  24  --  23 23 23    ANIONGAP 11  --   --  11  --  11 10 9   GLUCOSE 121* 124*  --  127*  --  135* 122* 98  BUN 20 23  --  42*  --  44* 51* 41*  CREATININE 1.66* 1.70*  --  3.52*  --  3.62* 2.84* 2.16*  AST 22  --   --  16  --  14* 14* 14*  ALT 14  --   --  11  --  10 10 11   ALKPHOS 73  --   --  60  --  49 45 41  BILITOT 1.3*  --   --  1.6*  --  1.5* 1.2 1.1  ALBUMIN 3.9  --   --  3.4*  --  2.8* 2.6* 2.8*  LATICACIDVEN  --   --  1.5 1.4 1.2  --   --   --   INR  --   --   --   --   --  1.8*  --   --   BNP  --   --   --   --   --  52.1 82.0 82.6  MG  --   --   --   --   --  2.0 2.3 2.3  CALCIUM 9.2  --   --  8.9  --  8.2* 8.3* 8.5*     Radiology Reports DG Chest Port 1 View  Result Date: 02/08/2022 CLINICAL DATA:  Shortness of breath. EXAM: PORTABLE CHEST 1 VIEW COMPARISON:  07/11/2020 FINDINGS: Subsegmental atelectasis noted at the left lung base. Left lung is clear. The cardio pericardial silhouette is enlarged. Telemetry leads overlie the chest. NG tube tip is positioned in the mid to distal esophagus. Trace lucency noted under the right hemidiaphragm. Telemetry leads overlie the chest. IMPRESSION: 1. NG tube tip is positioned in the mid to distal esophagus. Advancing this 2 15-20 cm should place the tip in the stomach with follow-up x-ray to confirm position at that time. 2. Trace lucency under the right hemidiaphragm. While trace intraperitoneal free air could have this appearance, the patient had a abdomen/pelvis CT about 13 hours ago, demonstrating no intraperitoneal free air that time. Right-side-up decubitus film may prove helpful to further evaluate as clinically warranted. Electronically Signed   By: Misty Stanley M.D.   On: 02/08/2022 11:20   CT CHEST ABDOMEN PELVIS WO CONTRAST  Result Date: 02/07/2022 CLINICAL DATA:  c/f recurrence strangulated hernia SBO EXAM: CT CHEST, ABDOMEN AND PELVIS WITHOUT CONTRAST TECHNIQUE: Multidetector CT imaging of the chest, abdomen and pelvis was  performed following the  standard protocol without IV contrast. RADIATION DOSE REDUCTION: This exam was performed according to the departmental dose-optimization program which includes automated exposure control, adjustment of the mA and/or kV according to patient size and/or use of iterative reconstruction technique. COMPARISON:  CT abdomen pelvis 02/06/2022 FINDINGS: CT CHEST FINDINGS Cardiovascular: Prominent heart size. No significant pericardial effusion. The thoracic aorta is normal in caliber. No atherosclerotic plaque of the thoracic aorta. No coronary artery calcifications. Mediastinum/Nodes: No enlarged mediastinal, hilar, or axillary lymph nodes. Thyroid gland, trachea, and esophagus demonstrate no significant findings. Moderate volume hiatal hernia with enteric tube tip terminating in the hiatal hernia. Enteric tube side port at the gastroesophageal junction that is located within the mediastinum given hernia. Lungs/Pleura: Peribronchovascular right middle and right lower lobe ground-glass airspace opacities. Linear atelectasis of the right lower lobe. No pulmonary nodule. No pulmonary mass. No pleural effusion. No pneumothorax. Musculoskeletal: No chest wall abnormality. No suspicious lytic or blastic osseous lesions. No acute displaced fracture. Multilevel degenerative changes of the spine. CT ABDOMEN PELVIS FINDINGS Hepatobiliary: No focal liver abnormality. No gallstones, gallbladder wall thickening, or pericholecystic fluid. No biliary dilatation. Pancreas: Diffusely mildly atrophic. No focal lesion. Otherwise normal pancreatic contour. No surrounding inflammatory changes. No main pancreatic ductal dilatation. Spleen: Normal in size without focal abnormality. Adrenals/Urinary Tract: No adrenal nodule bilaterally. No nephrolithiasis and no hydronephrosis. Simple appearing parapelvic cysts. Simple fluid dense parenchymal lesion within the kidneys likely represent simple renal cysts. Simple renal cysts, in the absence of  clinically indicated signs/symptoms, require no independent follow-up. No ureterolithiasis or hydroureter. The urinary bladder is unremarkable. Excretion of previously administered intravenous contrast noted within bilateral kidneys. Stomach/Bowel: Stomach is within normal limits. Interval increase in proximal mid small bowel fluid dilatation measuring up to 5.1 cm with associated air-fluid levels. Transition point noted at the supraumbilical ventral hernia containing a short loop of small bowel. (3:89). Associated mesenteric fat stranding of the contained loop of bowel within the hernia as well as the mesentery outside of the hernia sac. No pneumatosis. No evidence of large bowel wall thickening or dilatation. Appendix appears normal. Vascular/Lymphatic: No portal venous or mesenteric venous gas. No abdominal aorta or iliac aneurysm. Mild atherosclerotic plaque of the aorta and its branches. No abdominal, pelvic, or inguinal lymphadenopathy. Reproductive: Prostate is unremarkable. Other: No intraperitoneal free fluid. No intraperitoneal free gas. No organized fluid collection. Musculoskeletal: Trace fat containing right inguinal hernia. Left inguinal hernia repair with mesh with no recurrence. Diffuse subcutaneus soft tissue edema of the anterior abdominal wall surrounding the ventral hernia. No suspicious lytic or blastic osseous lesions. No acute displaced fracture. Multilevel degenerative changes of the spine. Chronic appearing mild L1 anterior wedge compression fracture. IMPRESSION: 1. Interval worsening of small-bowel obstruction due to incarcerated supraumbilical ventral hernia containing a short loop of small bowel. Mesenteric fat stranding of the associated small bowel is concerning for ischemia; however, there is limited evaluation for associated ischemia due to noncontrast study. No associated bowel perforation. Recommend emergent surgical consultation. 2. Associated right middle and right lower lobe  aspiration pneumonia. 3. Moderate volume hiatal hernia with enteric tube tip terminating in the hiatal hernia. Enteric tube side port at the gastroesophageal junction that is located within the mediastinum given hernia. 4. Trace fat containing right inguinal hernia. 5. Left inguinal hernia repair with mesh with no recurrence. 6. Prominent heart size. 7.  Aortic Atherosclerosis (ICD10-I70.0). These results were called by telephone at the time of interpretation on 02/07/2022 at 10:25 pm to  provider Georgina Snell , who verbally acknowledged these results. Electronically Signed   By: Iven Finn M.D.   On: 02/07/2022 22:36   DG Abd Portable 1 View  Result Date: 02/07/2022 CLINICAL DATA:  NG placement EXAM: PORTABLE ABDOMEN - 1 VIEW COMPARISON:  CT chest abdomen pelvis 02/07/2022 FINDINGS: Enteric tube noted with tip in the expected region of the gastroesophageal junction-given small hiatal hernia, likely within the gastric lumen. Gaseous dilatation of loops of small bowel in the left upper abdomen. No radio-opaque calculi or other significant radiographic abnormality are seen. Right lower lung zone linear atelectasis versus scarring. Left base atelectasis. IMPRESSION: 1. Enteric tube noted with tip in the expected region of the gastroesophageal junction-given small hiatal hernia, likely within the gastric lumen. Please see separately dictated CT abdomen pelvis 02/07/2022. 2. Small-bowel obstruction. Please see separately dictated CT abdomen pelvis 02/07/2022. Electronically Signed   By: Iven Finn M.D.   On: 02/07/2022 22:14   CT ABDOMEN PELVIS W CONTRAST  Result Date: 02/06/2022 CLINICAL DATA:  Hernia suspected, abdominal wall large, hardened skin with overlying warmth. Not reproducible. EXAM: CT ABDOMEN AND PELVIS WITH CONTRAST TECHNIQUE: Multidetector CT imaging of the abdomen and pelvis was performed using the standard protocol following bolus administration of intravenous contrast. RADIATION DOSE  REDUCTION: This exam was performed according to the departmental dose-optimization program which includes automated exposure control, adjustment of the mA and/or kV according to patient size and/or use of iterative reconstruction technique. CONTRAST:  72mL OMNIPAQUE IOHEXOL 350 MG/ML SOLN COMPARISON:  07/31/2018 FINDINGS: Lower chest: Mild right basilar atelectasis. Moderate hiatal hernia. Hepatobiliary: No focal liver abnormality is seen. Subtle hyperdensity in the gallbladder could reflect biliary sludge. No hyperdense gallstone. No evidence of wall thickening or pericholecystic inflammatory changes. No biliary dilatation. Pancreas: Unremarkable. No pancreatic ductal dilatation or surrounding inflammatory changes. Spleen: Normal in size without focal abnormality. Adrenals/Urinary Tract: Unremarkable adrenal glands. Multiple bilateral renal cysts including renal sinus cysts, not appreciably changed and do not require dedicated follow-up imaging. No renal stone or evidence of hydronephrosis. Urinary bladder within normal limits. Stomach/Bowel: Moderate-sized hiatal hernia. Stomach is otherwise within normal limits. Small-bowel obstruction secondary to a supraumbilical abdominal wall hernia containing small bowel which is abnormal in appearance. Bowel within the hernia sac is fluid-filled with mucosal hyperenhancement. Surrounding fat stranding. There is dilation of bowel upstream to the hernia. The small bowel distal to the hernia is decompressed. No focal colonic wall thickening or inflammatory changes. Vascular/Lymphatic: No significant vascular findings are present. No enlarged abdominal or pelvic lymph nodes. Reproductive: Prostate is unremarkable. Other: Prominent fat stranding within the subcutaneous soft tissues surrounding the supraumbilical hernia. No ascites. No pneumoperitoneum. Prior left inguinal hernia repair. Small fat containing right inguinal hernia. Musculoskeletal: No acute osseous abnormality.  IMPRESSION: 1. Small-bowel obstruction secondary to a supraumbilical abdominal wall hernia containing small bowel. Distended small bowel within the hernia sac is fluid-filled with mucosal hyperenhancement and surrounding fat stranding. Findings are concerning for strangulation. Surgical consultation is recommended. 2. Moderate-sized hiatal hernia. 3. Subtle hyperdensity in the gallbladder could reflect biliary sludge. No evidence of acute cholecystitis. 4. Small fat containing right inguinal hernia. These results were called by telephone at the time of interpretation on 02/06/2022 at 2:01 pm to provider Dr. Rondel Oh, who verbally acknowledged these results. Electronically Signed   By: Davina Poke D.O.   On: 02/06/2022 14:06      Signature  -   Lala Lund M.D on 02/10/2022 at 8:12 AM   -  To page go to [email or web address]

## 2022-02-10 NOTE — Progress Notes (Addendum)
ANTICOAGULATION CONSULT NOTE - follow-up  Pharmacy Consult for Eliquis >> IV heparin Indication: atrial fibrillation  Allergies  Allergen Reactions   Ibuprofen Other (See Comments)    Has a-Fib, unable to take   Isosorbide Other (See Comments)    HEADACHES from Imdur   Lisinopril Cough   Digoxin And Related Rash   Spironolactone Rash    Patient Measurements: Height: 6' (182.9 cm) Weight: 114.8 kg (253 lb) IBW/kg (Calculated) : 77.6 Heparin Dosing Weight: 102.3 kg  Vital Signs: Temp: 97.7 F (36.5 C) (01/17 0838) Temp Source: Oral (01/17 0838) BP: 125/79 (01/17 0838) Pulse Rate: 62 (01/17 0426)  Labs: Recent Labs    02/08/22 0226 02/09/22 0119 02/09/22 1032 02/09/22 1808 02/10/22 0137 02/10/22 0936  HGB 10.6* 9.8*  --   --  10.3*  --   HCT 32.6* 31.3*  --   --  32.5*  --   PLT 294 319  --   --  362  --   APTT 39*  --   --  46* 57* 66*  LABPROT 21.0*  --   --   --   --   --   INR 1.8*  --   --   --   --   --   HEPARINUNFRC  --   --  >1.10*  --  >1.10*  --   CREATININE 3.62* 2.84*  --   --  2.16*  --      Estimated Creatinine Clearance: 44 mL/min (A) (by C-G formula based on SCr of 2.16 mg/dL (H)).   Medical History: Past Medical History:  Diagnosis Date   Asthma, persistent    BPH (benign prostatic hyperplasia) 2014   CHF (congestive heart failure) (HCC)    Combined hyperlipidemia    Dysrhythmia    ATRIAL FIBRILLATION   GERD (gastroesophageal reflux disease)    History of kidney stones    Hypertension    Lymphocytic colitis 07/2011   MICROSCOPIC   Obesity    Persistent atrial fibrillation ([place])    Renal stone 04/2012   Seasonal allergic rhinitis    Sleep apnea    does not use a cpap machine    Medications:  Scheduled:   acetaminophen  650 mg Oral Q6H   pantoprazole (PROTONIX) IV  40 mg Intravenous Daily    Assessment: 66 yo male presented w/ abdominal wall hernia and is s/p laparotomy, repair of hernia, and small bowel resection on 1/15.  PMH includes Afib on Eliquis PTA (last dose 1/14) and s/p DCCV in December 2023. CHADS-VASc = ~3. Has been on [facility] for VTE ppx post-op (last dose 1/16 @0640 ). Cleared by surgery to begin IV heparin 1/16 with no bolus. Pharmacy consulted to dose IV heparin.   aPTT 66 seconds (therapeutic but low end of goal), HL remains elevated >1.1 due to recent Eliquis. CBC stable.  Goal of Therapy:  Heparin level 0.3-0.7 units/ml aPTT 66-102 seconds Monitor platelets by anticoagulation protocol: Yes   Plan:  Increase heparin drip slightly to 2050 units/hr Check 6 hour aPTT Daily HL, aPTT, CBC F/u ability to transition back to Eliquis once taking PO's  Dimple Nanas, PharmD, BCPS 02/10/2022 10:46 AM

## 2022-02-10 NOTE — Progress Notes (Signed)
[place] for  heparin Indication: atrial fibrillation Brief A/P: aPTT subtherapeutic Increase Heparin rate  Allergies  Allergen Reactions   Ibuprofen Other (See Comments)    Has a-Fib, unable to take   Isosorbide Other (See Comments)    HEADACHES from Imdur   Lisinopril Cough   Digoxin And Related Rash   Spironolactone Rash    Patient Measurements: Height: 6' (182.9 cm) Weight: 114.8 kg (253 lb) IBW/kg (Calculated) : 77.6 Heparin Dosing Weight: 102.3 kg  Vital Signs: Temp: 98.3 F (36.8 C) (01/17 0030) Temp Source: Oral (01/17 0030) BP: 133/74 (01/17 0030) Pulse Rate: 68 (01/17 0030)  Labs: Recent Labs    02/07/22 1728 02/08/22 0226 02/09/22 0119 02/09/22 1032 02/09/22 1808 02/10/22 0137  HGB 12.5* 10.6* 9.8*  --   --  10.3*  HCT 39.1 32.6* 31.3*  --   --  32.5*  PLT 377 294 319  --   --  362  APTT  --  39*  --   --  46* 57*  LABPROT  --  21.0*  --   --   --   --   INR  --  1.8*  --   --   --   --   HEPARINUNFRC  --   --   --  >1.10*  --  >1.10*  CREATININE 3.52* 3.62* 2.84*  --   --   --      Estimated Creatinine Clearance: 33.5 mL/min (A) (by C-G formula based on SCr of 2.84 mg/dL (H)).  Assessment: 67 y.o. male with h/o Afib, Eliquis on hold, for heparin  Goal of Therapy:  Heparin level 0.3-0.7 units/ml aPTT 66-102 seconds Monitor platelets by anticoagulation protocol: Yes   Plan:  Increase Heparin 2000 units/hr Check aPTT in 8 hours  Phillis Knack, PharmD, BCPS

## 2022-02-10 NOTE — Progress Notes (Signed)
Per pt he had a "good sized bowel movement". Tolerating fluids well. Pt resting with call bell within reach.  Will continue to monitor.

## 2022-02-10 NOTE — Progress Notes (Signed)
Rounding Note    Patient Name: [name] Date of Encounter: 02/10/2022  [place] Cardiologist: Jenkins Rouge, MD   Subjective   Denies any CP or SOB. Passing gas, has not had a BM yet since surgery. Had apple juice and frozen strawberry pop sickle this morning for the first time in 3 days, has not started on solid food yet.   Inpatient Medications    Scheduled Meds:  pantoprazole (PROTONIX) IV  40 mg Intravenous Daily   Continuous Infusions:  dextrose 5 % and 0.45 % NaCl with KCl 20 mEq/L     heparin 2,000 Units/hr (02/10/22 0308)   methocarbamol (ROBAXIN) IV 500 mg (02/09/22 2012)   piperacillin-tazobactam (ZOSYN)  IV 3.375 g (02/10/22 0529)   PRN Meds: acetaminophen **OR** acetaminophen, fentaNYL (SUBLIMAZE) injection, hydrALAZINE, HYDROmorphone (DILAUDID) injection, methocarbamol (ROBAXIN) IV, metoprolol tartrate, ondansetron (ZOFRAN) IV, prochlorperazine, sodium chloride   Vital Signs    Vitals:   02/09/22 1933 02/10/22 0030 02/10/22 0426 02/10/22 0838  BP: 129/68 133/74 (!) 141/86 125/79  Pulse: 70 68 62   Resp: 15 19 17 17   Temp: 98.1 F (36.7 C) 98.3 F (36.8 C) 97.9 F (36.6 C) 97.7 F (36.5 C)  TempSrc: Oral Oral Oral Oral  SpO2: 91% 91% 96% 95%  Weight:      Height:        Intake/Output Summary (Last 24 hours) at 02/10/2022 1011 Last data filed at 02/10/2022 0529 Gross per 24 hour  Intake 1922.67 ml  Output --  Net 1922.67 ml      02/07/2022    5:30 PM 02/06/2022   11:23 AM 01/27/2022    1:14 PM  Last 3 Weights  Weight (lbs) 253 lb 253 lb 253 lb 3.2 oz  Weight (kg) 114.76 kg 114.76 kg 114.851 kg      Telemetry    NSR with single episode of 11 beats run of SVT around 11PM last night, occasional bigeminy - Personally Reviewed  ECG    NSR with RBBB - Personally Reviewed  Physical Exam   GEN: No acute distress.   Neck: No JVD Cardiac: RRR, no murmurs, rubs, or gallops.  Respiratory: Clear to auscultation bilaterally. GI:  Soft, nontender, non-distended  MS: No edema; No deformity. Neuro:  Nonfocal  Psych: Normal affect   Labs    High Sensitivity Troponin:  No results for input(s): "TROPONINIHS" in the last 720 hours.   Chemistry Recent Labs  Lab 02/08/22 0226 02/09/22 0119 02/10/22 0137  NA 137 138 140  K 4.2 4.1 3.9  CL 103 105 108  CO2 23 23 23   GLUCOSE 135* 122* 98  BUN 44* 51* 41*  CREATININE 3.62* 2.84* 2.16*  CALCIUM 8.2* 8.3* 8.5*  MG 2.0 2.3 2.3  PROT 5.8* 5.9* 6.5  ALBUMIN 2.8* 2.6* 2.8*  AST 14* 14* 14*  ALT 10 10 11   ALKPHOS 49 45 41  BILITOT 1.5* 1.2 1.1  GFRNONAA 18* 24* 33*  ANIONGAP 11 10 9     Lipids No results for input(s): "CHOL", "TRIG", "HDL", "LABVLDL", "LDLCALC", "CHOLHDL" in the last 168 hours.  Hematology Recent Labs  Lab 02/08/22 0226 02/09/22 0119 02/10/22 0137  WBC 13.7* 13.6* 9.1  RBC 4.30 4.06* 4.26  HGB 10.6* 9.8* 10.3*  HCT 32.6* 31.3* 32.5*  MCV 75.8* 77.1* 76.3*  MCH 24.7* 24.1* 24.2*  MCHC 32.5 31.3 31.7  RDW 17.5* 17.5* 17.5*  PLT 294 319 362   Thyroid No results for input(s): "TSH", "FREET4" in the  last 168 hours.  BNP Recent Labs  Lab 02/08/22 0226 02/09/22 0119 02/10/22 0137  BNP 52.1 82.0 82.6    DDimer No results for input(s): "DDIMER" in the last 168 hours.   Radiology    DG Chest Port 1 View  Result Date: 02/08/2022 CLINICAL DATA:  Shortness of breath. EXAM: PORTABLE CHEST 1 VIEW COMPARISON:  07/11/2020 FINDINGS: Subsegmental atelectasis noted at the left lung base. Left lung is clear. The cardio pericardial silhouette is enlarged. Telemetry leads overlie the chest. NG tube tip is positioned in the mid to distal esophagus. Trace lucency noted under the right hemidiaphragm. Telemetry leads overlie the chest. IMPRESSION: 1. NG tube tip is positioned in the mid to distal esophagus. Advancing this 2 15-20 cm should place the tip in the stomach with follow-up x-ray to confirm position at that time. 2. Trace lucency under the right  hemidiaphragm. While trace intraperitoneal free air could have this appearance, the patient had a abdomen/pelvis CT about 13 hours ago, demonstrating no intraperitoneal free air that time. Right-side-up decubitus film may prove helpful to further evaluate as clinically warranted. Electronically Signed   By: Misty Stanley M.D.   On: 02/08/2022 11:20    Cardiac Studies   Echo 09/04/2021 1. Left ventricular ejection fraction, by estimation, is 45 to 50%. The  left ventricle has mildly decreased function. The left ventricle  demonstrates global hypokinesis. The left ventricular internal cavity size  was severely dilated. Left ventricular  diastolic parameters are consistent with Grade I diastolic dysfunction  (impaired relaxation).   2. Right ventricular systolic function is normal. The right ventricular  size is normal. There is normal pulmonary artery systolic pressure.   3. Left atrial size was moderately dilated.   4. The mitral valve is normal in structure. Mild, eccentric, mitral valve  regurgitation. No evidence of mitral stenosis.   5. The aortic valve is tricuspid. Aortic valve regurgitation is not  visualized. No aortic stenosis is present.   6. The inferior vena cava is normal in size with greater than 50%  respiratory variability, suggesting right atrial pressure of 3 mmHg.   Comparison(s): Prior images reviewed side by side. LV size has increased  from 2020-> LVIDd 6.3 cm -> 6.9 cm.   Patient Profile     67 y.o. male with PMH of persistent atrial fibrillation (h/o ablation 2021, recently underwent outpatient DCCV on 12/19, scheduled to see Dr. Myles Gip on 1/23 to consider repeat ablation), tachy-mediated cardiomyopathy, HTN, CKD and OSA who is admitted with strangulated abdominal wall hernia and cardiology service consulted given the recent cardioversion and on Eliquis. Patient underwent laparotomy, repair of strangulated incisional ventral hernia with partial omentectomy and small  bowel resection on 02/08/2022. He has been maintaining NSR during this hospitalization. Hospital course complicated by AKI, previous baseline Cr 1.6-1.9. Cr peaked on 1/15 at 3.6. Came down to 2.16 on 1/17.   Assessment & Plan    Persistent atrial fibrillation  - previously underwent afib ablation 03/16/2019 by Dr. Rayann Heman  - recently underwent outpatient DCCV on 12/19, planned to see Dr. Myles Gip to consider repeat ablation  - Eliquis was stopped prior to abdominal surgery, was near the end of week 4 of Eliquis after recent cardioversion. Currently maintaining NSR during this hospitalization, currently on IV heparin. Home Eliquis and amiodarone held.   - ambulated this morning, tolerated apple juice and frozen strawberry pop sickle. Still euvolemic. Would like to resume at least oral amiodarone once cleared to take oral meds. Will also  consider resume Eliquis prior to D/C  Strangulated abdominal wall hernia: underwent laparotomy, repair of strangulated incisional ventral hernia with partial omentectomy and small bowel resection on 02/08/2022  Chronic combined systolic and diastolic CHF: last echo 4/86/2824 EF 45-50%. Home Entresto, eplerenone and lasix held given NPO status and AKI  Suspected aspiration PNA: placed on IV zosyn by primary team  AKI: previous baseline Cr 1.6-1.9. Cr peaked on 1/15 at 3.6. Came down to 2.16 on 1/17.  Bradycardia: HR 50s, asymptomatic  OSA: not on CPAP      For questions or updates, please contact [place] Please consult [email or web address] for contact info under        Signed, Almyra Deforest, [place]  02/10/2022, 10:11 AM

## 2022-02-10 NOTE — Progress Notes (Signed)
Mobility Specialist Progress Note:   02/10/22 1000  Mobility  Activity Ambulated with assistance in hallway  Level of Assistance Standby assist, set-up cues, supervision of patient - no hands on  Assistive Device  (iv pole)  Distance Ambulated (ft) 550 ft  Activity Response Tolerated well  $Mobility charge 1 Mobility   Pt in chair willing to participate in mobility. Complaints of incision pain. Left in chair with call bell in reach and all needs met.   Gareth Eagle Ritta Hammes Mobility Specialist Please contact via [facility] [employer] or  Rehab Office at [phone]

## 2022-02-10 NOTE — Progress Notes (Signed)
Physical Therapy Treatment Patient Details Name: RACER QUAM MRN: [id number] DOB: 26-Mar-1955 Today's Date: 02/10/2022   History of Present Illness Patient is a 67 y/o male who presents on 1/14 with abdominal pain. Found to have Strangulated abdominal wall hernia.  S/p laparotomy, repair of strangulated incisional ventral hernia with partial omentectomy and small bowel resection on 02/08/2022. PMH includes CHF, HTN, obesity, A-fib, sleep apnea.    PT Comments    Pt is demonstrating good progress with gait speed and stability, now ambulating without UE support at a supervision level. He does display some gait deviations compared to his baseline, including a wider BOS and a slightly flexed posture with slight L knee flexion throughout, which appears to be due to his abdominal pain. Despite the pain, pt did not have any overt LOB with all mobility. Encouraged pt to continue to mobilize with staff. Will continue to follow acutely. Current recommendations remain appropriate.     Recommendations for follow up therapy are one component of a multi-disciplinary discharge planning process, led by the attending physician.  Recommendations may be updated based on patient status, additional functional criteria and insurance authorization.  Follow Up Recommendations  No PT follow up     Assistance Recommended at Discharge PRN  Patient can return home with the following Assist for transportation;Assistance with cooking/housework;A little help with bathing/dressing/bathroom   Equipment Recommendations  None recommended by PT    Recommendations for Other Services       Precautions / Restrictions Precautions Precautions: Other (comment) Precaution Comments: abdominal binder Required Braces or Orthoses: Other Brace Other Brace: abdominal binder Restrictions Weight Bearing Restrictions: No     Mobility  Bed Mobility               General bed mobility comments: Pt sitting up in chair upon  arrival.    Transfers Overall transfer level: Needs assistance Equipment used: None Transfers: Sit to/from Stand Sit to Stand: Supervision           General transfer comment: Extra time to power up to stand and to lower down to sit due to abdominal pain, no LOB, supervision for safety    Ambulation/Gait Ambulation/Gait assistance: Supervision Gait Distance (Feet): 580 Feet Assistive device: None Gait Pattern/deviations: Step-through pattern, Decreased stride length, Knee flexed in stance - left, Wide base of support, Trunk flexed Gait velocity: decreased Gait velocity interpretation: 1.31 - 2.62 ft/sec, indicative of limited community ambulator   General Gait Details: Pt with wide BOS and slightly flexed L knee throughout, reporting his gait is abnormal due to abdominal pain. No LOB though, supervision for safety.   Stairs             Wheelchair Mobility    Modified Rankin (Stroke Patients Only)       Balance Overall balance assessment: No apparent balance deficits (not formally assessed)                                          Cognition Arousal/Alertness: Awake/alert Behavior During Therapy: WFL for tasks assessed/performed Overall Cognitive Status: Within Functional Limits for tasks assessed                                          Exercises      General Comments General  comments (skin integrity, edema, etc.): encouraged heel raises and gentle standing marching and hip abduction at sink along with walking the halls with staff throughout the day to reduce risk of deconditioning      Pertinent Vitals/Pain Pain Assessment Pain Assessment: Faces Faces Pain Scale: Hurts a little bit Pain Location: abdomen Pain Descriptors / Indicators: Sore, Discomfort, Operative site guarding Pain Intervention(s): Limited activity within patient's tolerance, Monitored during session    Home Living                           Prior Function            PT Goals (current goals can now be found in the care plan section) Acute Rehab PT Goals Patient Stated Goal: to return to PLOF/go home PT Goal Formulation: With patient Time For Goal Achievement: 02/22/22 Potential to Achieve Goals: Good Progress towards PT goals: Progressing toward goals    Frequency    Min 2X/week      PT Plan Frequency needs to be updated;Equipment recommendations need to be updated    Co-evaluation              AM-PAC PT "6 Clicks" Mobility   Outcome Measure  Help needed turning from your back to your side while in a flat bed without using bedrails?: A Little Help needed moving from lying on your back to sitting on the side of a flat bed without using bedrails?: A Little Help needed moving to and from a bed to a chair (including a wheelchair)?: A Little Help needed standing up from a chair using your arms (e.g., wheelchair or bedside chair)?: A Little Help needed to walk in hospital room?: A Little Help needed climbing 3-5 steps with a railing? : A Little 6 Click Score: 18    End of Session Equipment Utilized During Treatment: Gait belt Activity Tolerance: Patient tolerated treatment well Patient left: in chair;with call bell/phone within reach Nurse Communication: Mobility status PT Visit Diagnosis: Pain Pain - part of body:  (abdomen)     Time: 1549-1600 PT Time Calculation (min) (ACUTE ONLY): 11 min  Charges:  $Therapeutic Activity: 8-22 mins                     Moishe Spice, PT, DPT Acute Rehabilitation Services  Office: [phone]    Orvan Falconer 02/10/2022, 4:10 PM

## 2022-02-10 NOTE — Progress Notes (Signed)
ANTICOAGULATION CONSULT NOTE - follow-up  Pharmacy Consult for Eliquis >> IV heparin Indication: atrial fibrillation  Allergies  Allergen Reactions   Ibuprofen Other (See Comments)    Has a-Fib, unable to take   Isosorbide Other (See Comments)    HEADACHES from Imdur   Lisinopril Cough   Digoxin And Related Rash   Spironolactone Rash    Patient Measurements: Height: 6' (182.9 cm) Weight: 114.8 kg (253 lb) IBW/kg (Calculated) : 77.6 Heparin Dosing Weight: 102.3 kg  Vital Signs: Temp: 97.7 F (36.5 C) (01/17 1600) Temp Source: Oral (01/17 1600) BP: 144/77 (01/17 1600) Pulse Rate: 61 (01/17 1600)  Labs: Recent Labs    02/08/22 0226 02/09/22 0119 02/09/22 1032 02/09/22 1808 02/10/22 0137 02/10/22 0936 02/10/22 1759  HGB 10.6* 9.8*  --   --  10.3*  --   --   HCT 32.6* 31.3*  --   --  32.5*  --   --   PLT 294 319  --   --  362  --   --   APTT 39*  --   --    < > 57* 66* 74*  LABPROT 21.0*  --   --   --   --   --   --   INR 1.8*  --   --   --   --   --   --   HEPARINUNFRC  --   --  >1.10*  --  >1.10*  --   --   CREATININE 3.62* 2.84*  --   --  2.16*  --   --    < > = values in this interval not displayed.    Estimated Creatinine Clearance: 44 mL/min (A) (by C-G formula based on SCr of 2.16 mg/dL (H)).   Medical History: Past Medical History:  Diagnosis Date   Asthma, persistent    BPH (benign prostatic hyperplasia) 2014   CHF (congestive heart failure) (HCC)    Combined hyperlipidemia    Dysrhythmia    ATRIAL FIBRILLATION   GERD (gastroesophageal reflux disease)    History of kidney stones    Hypertension    Lymphocytic colitis 07/2011   MICROSCOPIC   Obesity    Persistent atrial fibrillation ([place])    Renal stone 04/2012   Seasonal allergic rhinitis    Sleep apnea    does not use a cpap machine    Medications:  Scheduled:   acetaminophen  650 mg Oral Q6H   pantoprazole (PROTONIX) IV  40 mg Intravenous Daily    Assessment: 67 yo male presented  w/ abdominal wall hernia and is s/p laparotomy, repair of hernia, and small bowel resection on 1/15. PMH includes Afib on Eliquis PTA (last dose 1/14) and s/p DCCV in December 2023. CHADS-VASc = ~3. Has been on [facility] for VTE ppx post-op (last dose 1/16 @0640 ). Cleared by surgery to begin IV heparin 1/16 with no bolus. Pharmacy consulted to dose IV heparin.   aPTT 66 seconds (therapeutic but low end of goal), HL remains elevated >1.1 due to recent Eliquis. CBC stable.  02/10/2022 PM update: aPTT: 74 seconds (therapeutic) No signs of bleeding No issues with the heparin infusion   Goal of Therapy:  Heparin level 0.3-0.7 units/ml aPTT 66-102 seconds Monitor platelets by anticoagulation protocol: Yes   Plan:  Continue heparin drip at 2050 units/hr Daily HL, aPTT, CBC F/u ability to transition back to Eliquis once taking PO's  Annalycia Done BS, PharmD, BCPS Clinical Pharmacist 02/10/2022 7:33 PM  Contact:  [phone] after 3 PM  "Be curious, not judgmental..." -Jamal Maes

## 2022-02-11 DIAGNOSIS — K430 Incisional hernia with obstruction, without gangrene: Secondary | ICD-10-CM | POA: Diagnosis not present

## 2022-02-11 DIAGNOSIS — I5041 Acute combined systolic (congestive) and diastolic (congestive) heart failure: Secondary | ICD-10-CM | POA: Diagnosis not present

## 2022-02-11 DIAGNOSIS — I10 Essential (primary) hypertension: Secondary | ICD-10-CM | POA: Diagnosis not present

## 2022-02-11 DIAGNOSIS — N179 Acute kidney failure, unspecified: Secondary | ICD-10-CM | POA: Diagnosis not present

## 2022-02-11 LAB — CBC WITH DIFFERENTIAL/PLATELET
Abs Immature Granulocytes: 0.02 10*3/uL (ref 0.00–0.07)
Basophils Absolute: 0 10*3/uL (ref 0.0–0.1)
Basophils Relative: 1 %
Eosinophils Absolute: 0.1 10*3/uL (ref 0.0–0.5)
Eosinophils Relative: 2 %
HCT: 28.5 % — ABNORMAL LOW (ref 39.0–52.0)
Hemoglobin: 9 g/dL — ABNORMAL LOW (ref 13.0–17.0)
Immature Granulocytes: 0 %
Lymphocytes Relative: 15 %
Lymphs Abs: 0.8 10*3/uL (ref 0.7–4.0)
MCH: 24.5 pg — ABNORMAL LOW (ref 26.0–34.0)
MCHC: 31.6 g/dL (ref 30.0–36.0)
MCV: 77.4 fL — ABNORMAL LOW (ref 80.0–100.0)
Monocytes Absolute: 0.7 10*3/uL (ref 0.1–1.0)
Monocytes Relative: 12 %
Neutro Abs: 3.9 10*3/uL (ref 1.7–7.7)
Neutrophils Relative %: 70 %
Platelets: 310 10*3/uL (ref 150–400)
RBC: 3.68 MIL/uL — ABNORMAL LOW (ref 4.22–5.81)
RDW: 17.2 % — ABNORMAL HIGH (ref 11.5–15.5)
WBC: 5.6 10*3/uL (ref 4.0–10.5)
nRBC: 0 % (ref 0.0–0.2)

## 2022-02-11 LAB — COMPREHENSIVE METABOLIC PANEL
ALT: 10 U/L (ref 0–44)
AST: 12 U/L — ABNORMAL LOW (ref 15–41)
Albumin: 2.4 g/dL — ABNORMAL LOW (ref 3.5–5.0)
Alkaline Phosphatase: 37 U/L — ABNORMAL LOW (ref 38–126)
Anion gap: 6 (ref 5–15)
BUN: 28 mg/dL — ABNORMAL HIGH (ref 8–23)
CO2: 23 mmol/L (ref 22–32)
Calcium: 8.3 mg/dL — ABNORMAL LOW (ref 8.9–10.3)
Chloride: 111 mmol/L (ref 98–111)
Creatinine, Ser: 1.65 mg/dL — ABNORMAL HIGH (ref 0.61–1.24)
GFR, Estimated: 46 mL/min — ABNORMAL LOW (ref >60)
Glucose, Bld: 96 mg/dL (ref 70–99)
Potassium: 3.8 mmol/L (ref 3.5–5.1)
Sodium: 140 mmol/L (ref 135–145)
Total Bilirubin: 0.8 mg/dL (ref 0.3–1.2)
Total Protein: 5.4 g/dL — ABNORMAL LOW (ref 6.5–8.1)

## 2022-02-11 LAB — HEPARIN LEVEL (UNFRACTIONATED): Heparin Unfractionated: 1.02 [IU]/mL — ABNORMAL HIGH (ref 0.30–0.70)

## 2022-02-11 LAB — BRAIN NATRIURETIC PEPTIDE: B Natriuretic Peptide: 168.8 pg/mL — ABNORMAL HIGH (ref 0.0–100.0)

## 2022-02-11 LAB — MAGNESIUM: Magnesium: 2.2 mg/dL (ref 1.7–2.4)

## 2022-02-11 LAB — TSH: TSH: 0.232 u[IU]/mL — ABNORMAL LOW (ref 0.350–4.500)

## 2022-02-11 LAB — APTT: aPTT: 102 seconds — ABNORMAL HIGH (ref 24–36)

## 2022-02-11 MED ORDER — AMLODIPINE BESYLATE 5 MG PO TABS
5.0000 mg | ORAL_TABLET | Freq: Every day | ORAL | Status: DC
  Administered 2022-02-11 – 2022-02-12 (×2): 5 mg via ORAL
  Filled 2022-02-11 (×2): qty 1

## 2022-02-11 MED ORDER — POTASSIUM CHLORIDE 20 MEQ PO PACK
40.0000 meq | PACK | Freq: Once | ORAL | Status: AC
  Administered 2022-02-11: 40 meq via ORAL
  Filled 2022-02-11: qty 2

## 2022-02-11 MED ORDER — AMOXICILLIN-POT CLAVULANATE 875-125 MG PO TABS
1.0000 | ORAL_TABLET | Freq: Two times a day (BID) | ORAL | Status: DC
  Administered 2022-02-11 – 2022-02-12 (×3): 1 via ORAL
  Filled 2022-02-11 (×3): qty 1

## 2022-02-11 MED ORDER — APIXABAN 5 MG PO TABS
5.0000 mg | ORAL_TABLET | Freq: Two times a day (BID) | ORAL | Status: DC
  Administered 2022-02-11 – 2022-02-12 (×3): 5 mg via ORAL
  Filled 2022-02-11 (×3): qty 1

## 2022-02-11 MED ORDER — FUROSEMIDE 20 MG PO TABS
20.0000 mg | ORAL_TABLET | Freq: Once | ORAL | Status: AC
  Administered 2022-02-11: 20 mg via ORAL
  Filled 2022-02-11: qty 1

## 2022-02-11 MED ORDER — OXYCODONE HCL 5 MG PO TABS
5.0000 mg | ORAL_TABLET | Freq: Four times a day (QID) | ORAL | Status: DC | PRN
  Administered 2022-02-11 – 2022-02-12 (×4): 5 mg via ORAL
  Filled 2022-02-11 (×4): qty 1

## 2022-02-11 MED ORDER — AMIODARONE HCL 200 MG PO TABS
100.0000 mg | ORAL_TABLET | Freq: Every day | ORAL | Status: DC
  Administered 2022-02-11 – 2022-02-12 (×2): 100 mg via ORAL
  Filled 2022-02-11 (×2): qty 1

## 2022-02-11 NOTE — Discharge Instructions (Addendum)
[facility]      [facility] °[phone] ° °OPEN ABDOMINAL SURGERY: POST OP INSTRUCTIONS ° °Always review your discharge instruction sheet given to you by the facility where your surgery was performed. ° °IF YOU HAVE DISABILITY OR FAMILY LEAVE FORMS, YOU MUST BRING THEM TO THE OFFICE FOR PROCESSING.  PLEASE DO NOT GIVE THEM TO YOUR DOCTOR. ° °A prescription for pain medication may be given to you upon discharge.  Take your pain medication as prescribed, if needed.  If narcotic pain medicine is not needed, then you may take acetaminophen (Tylenol) or ibuprofen (Advil) as needed. °Take your usually prescribed medications unless otherwise directed. °If you need a refill on your pain medication, please contact your pharmacy. They will contact our office to request authorization.  Prescriptions will not be filled after 5pm or on week-ends. °You should follow a light diet the first few days after arrival home, such as soup and crackers, pudding, etc.unless your doctor has advised otherwise. A high-fiber, low fat diet can be resumed as tolerated.   Be sure to include lots of fluids daily. Most patients will experience some swelling and bruising on the chest and neck area.  Ice packs will help.  Swelling and bruising can take several days to resolve °Most patients will experience some swelling and bruising in the area of the incision. Ice pack will help. Swelling and bruising can take several days to resolve..  °It is common to experience some constipation if taking pain medication after surgery.  Increasing fluid intake and taking a stool softener will usually help or prevent this problem from occurring.  A mild laxative (Milk of Magnesia or Miralax) should be taken according to package directions if there are no bowel movements after 48 hours. ° You may have steri-strips (small skin tapes) in place directly over the incision.  These strips should be left on the skin for 7-10 days.  If your surgeon used skin  glue on the incision, you may shower in 24 hours.  The glue will flake off over the next 2-3 weeks.  Any sutures or staples will be removed at the office during your follow-up visit. You may find that a light gauze bandage over your incision may keep your staples from being rubbed or pulled. You may shower and replace the bandage daily. °ACTIVITIES:  You may resume regular (light) daily activities beginning the next day--such as daily self-care, walking, climbing stairs--gradually increasing activities as tolerated.  You may have sexual intercourse when it is comfortable.  Refrain from any heavy lifting or straining until approved by your doctor. °You may drive when you no longer are taking prescription pain medication, you can comfortably wear a seatbelt, and you can safely maneuver your car and apply brakes ° °You should see your doctor in the office for a follow-up appointment approximately two weeks after your surgery.  Make sure that you call for this appointment within a day or two after you arrive home to insure a convenient appointment time. ° °WHEN TO CALL YOUR DOCTOR: °Fever over 101.0 °Inability to urinate °Nausea and/or vomiting °Extreme swelling or bruising °Continued bleeding from incision. °Increased pain, redness, or drainage from the incision. °Difficulty swallowing or breathing °Muscle cramping or spasms. °Numbness or tingling in hands or feet or around lips. ° °The clinic staff is available to answer your questions during regular business hours.  Please don’t hesitate to call and ask to speak to one of the nurses if you have concerns. ° °For   For further questions, please visit [email or web address]    Follow with Primary MD [name], MD in 7 days   Get CBC, CMP, Magnesium, 2 view Chest X ray -  checked next visit with your primary MD   Activity: As tolerated with Full fall precautions use walker/cane & assistance as needed  Disposition Home    Diet: Heart Healthy     Special Instructions: If you have smoked or chewed Tobacco  in the last 2 yrs please stop smoking, stop any regular Alcohol  and or any Recreational drug use.  On your next visit with your primary care physician please Get Medicines reviewed and adjusted.  Please request your Prim.MD to go over all Hospital Tests and Procedure/Radiological results at the follow up, please get all Hospital records sent to your Prim MD by signing hospital release before you go home.  If you experience worsening of your admission symptoms, develop shortness of breath, life threatening emergency, suicidal or homicidal thoughts you must seek medical attention immediately by calling 911 or calling your MD immediately  if symptoms less severe.  You Must read complete instructions/literature along with all the possible adverse reactions/side effects for all the Medicines you take and that have been prescribed to you. Take any new Medicines after you have completely understood and accpet all the possible adverse reactions/side effects.

## 2022-02-11 NOTE — Progress Notes (Signed)
Mobility Specialist Progress Note:   02/11/22 1122  Mobility  Activity Ambulated with assistance in hallway  Level of Assistance Independent  Assistive Device None  Distance Ambulated (ft) 550 ft  Activity Response Tolerated well  $Mobility charge 1 Mobility   Pt in chair willing to participate in mobility. Complaints of 3/10 incisional pain. Left in chair with call bell in reach and all needs met.   Gareth Eagle Merari Pion Mobility Specialist Please contact via [facility] [employer] or  Rehab Office at [phone]

## 2022-02-11 NOTE — Progress Notes (Signed)
Occupational Therapy Treatment Patient Details Name: [name] MRN: [id number] DOB: 1955/12/22 Today's Date: 02/11/2022   History of present illness Patient is a 67 y/o male who presents on 1/14 with abdominal pain. Found to have Strangulated abdominal wall hernia.  S/p laparotomy, repair of strangulated incisional ventral hernia with partial omentectomy and small bowel resection on 02/08/2022. PMH includes CHF, HTN, obesity, A-fib, sleep apnea.   OT comments  Pt educated in compensatory strategies/techniques for LB ADLs to minimize abdominal pain. Pt receptive to education and returned demonstration. Pt will rely on his family if his abdominal pain prevents him reaching his feet. Pt declines 3 in 1 or riser for over toilet. Plans to shower in standing. Pt educated in avoiding lifting when he returns home. No further OT needs.    Recommendations for follow up therapy are one component of a multi-disciplinary discharge planning process, led by the attending physician.  Recommendations may be updated based on patient status, additional functional criteria and insurance authorization.    Follow Up Recommendations  No OT follow up     Assistance Recommended at Discharge PRN  Patient can return home with the following  Assistance with cooking/housework;Assist for transportation   Equipment Recommendations  None recommended by OT    Recommendations for Other Services      Precautions / Restrictions Precautions Precaution Comments: abdominal binder       Mobility Bed Mobility               General bed mobility comments: in chair    Transfers Overall transfer level: Modified independent Equipment used: None               General transfer comment: increased time     Balance Overall balance assessment: No apparent balance deficits (not formally assessed)                                         ADL either performed or assessed with clinical  judgement   ADL Overall ADL's : Modified independent                                            Extremity/Trunk Assessment              Vision       Perception     Praxis      Cognition Arousal/Alertness: Awake/alert Behavior During Therapy: WFL for tasks assessed/performed Overall Cognitive Status: Within Functional Limits for tasks assessed                                          Exercises      Shoulder Instructions       General Comments      Pertinent Vitals/ Pain       Pain Assessment Pain Assessment: Faces Faces Pain Scale: Hurts little more Pain Location: abdomen Pain Descriptors / Indicators: Sore, Discomfort, Operative site guarding Pain Intervention(s): Monitored during session, Repositioned  Home Living  Prior Functioning/Environment              Frequency           Progress Toward Goals  OT Goals(current goals can now be found in the care plan section)  Progress towards OT goals: Goals met/education completed, patient discharged from [place] All goals met and education completed, patient discharged from OT services    Co-evaluation                 AM-PAC OT "6 Clicks" Daily Activity     Outcome Measure   Help from another person eating meals?: None Help from another person taking care of personal grooming?: None Help from another person toileting, which includes using toliet, bedpan, or urinal?: None Help from another person bathing (including washing, rinsing, drying)?: None Help from another person to put on and taking off regular upper body clothing?: None Help from another person to put on and taking off regular lower body clothing?: None 6 Click Score: 24    End of Session    OT Visit Diagnosis: Pain   Activity Tolerance Patient tolerated treatment well   Patient Left in chair;with call bell/phone within  reach   Nurse Communication          Time: 1410-1426 OT Time Calculation (min): 16 min  Charges: OT General Charges $OT Visit: 1 Visit OT Treatments $Self Care/Home Management : 8-22 mins  Cleta Alberts, OTR/L Acute Rehabilitation Services Office: [phone]  Malka So 02/11/2022, 3:09 PM

## 2022-02-11 NOTE — Progress Notes (Signed)
PROGRESS NOTE                                                                                                                                                                                                             Patient Demographics:    [name], is a 67 y.o. male, DOB - Oct 24, 1955, IHK:[id number]  Outpatient Primary MD for the patient is [name], MD    LOS - 4  Admit date - 02/07/2022    Chief Complaint  Patient presents with   Abdominal Pain       Brief Narrative (HPI from H&P)    67 year old male with past medical history of atrial fibrillation, CHF, hypertension, lymphocytic colitis, hyperlipidemia, BPH, sleep apnea.  Said that on 4 PM he developed sudden onset of pain abdominal pain over the incision site where they went to remove kidney stones and per patient where they did work for his robotic assisted prostatectomy.  In the ER his workup was consistent with strangulated abdominal wall hernia, general surgery was consulted and he was emergently taken to the OR and hospitalist team was requested to admit the patient.   Subjective:   Patient in bed, appears comfortable, denies any headache, no fever, no chest pain or pressure, no shortness of breath , no abdominal pain. No new focal weakness.   Assessment  & Plan :    Strangulated abdominal wall hernia.  S/p laparotomy, repair of strangulated incisional ventral hernia with partial omentectomy and small bowel resection on 02/08/2022. He has been seen by general surgery underwent surgical correction on 02/08/2022, NG tube fell out by itself night of 02/09/2022 however patient is not nauseated, now passing flatus and having bowel movements oral diet being advanced.  Stop IV fluids and monitor.  If symptom-free likely discharge on 02/12/2022.  Nausea vomiting with suspicion of aspiration pneumonia.  Switch to Augmentin to complete total of 5 days.  AKI CKD 3B  with baseline creatinine of around 2.  Due to  nausea vomiting, prerenal azotemia and being on Entresto, hydrate with IV fluids, has Foley catheter placed in OR which will be continued, monitor renal function with hydration.   BPH.  For now Foley, Flomax once taking oral diet.  Chronic systolic heart failure.  EF 45% on TTE done 6 months ago.  Currently dehydrated.  Monitor closely with  ongoing IV fluids due to n.p.o. status, ACE ARB's/Entresto on hold due to AKI.  As needed Lopressor for now. Cards consulted as well.  Persistent atrial fibrillation [place] vas 2 score of 4.  Followed by cardiology here currently being replaced on oral amiodarone at home dose Eliquis on 02/11/2022 per cardiology continue to monitor, mild bradycardia at times but asymptomatic and nothing specific to be done per cardiology.  TSH if anything is slightly on the lower side due to sick euthyroid.  OSA.  Nighttime oxygen if needed does not use CPAP.  COPD.  No acute issues.  Supportive care.  HTN.  NPO.  As needed IV Lopressor and hydralazine for now.        Condition - Extremely Guarded  Family Communication  :  Son [name] 02/08/22  Code Status :  Full  Consults  :  CCS, Cards  PUD Prophylaxis : PPI   Procedures  :      Surgeon: Romana Juniper MD FACS - 02/08/22   Procedure performed:  Open primary repair of strangulated incisional ventral hernia, defect 3 x 4 cm Partial omentectomy Small bowel resection      Disposition Plan  :    Status is: Inpatient   DVT Prophylaxis  :  Hep gtt  SCDs Start: 02/07/22 2312 apixaban (ELIQUIS) tablet 5 mg   Lab Results  Component Value Date   PLT 310 02/11/2022    Diet :  Diet Order             DIET SOFT Room service appropriate? Yes; Fluid consistency: Thin  Diet effective now                    Inpatient Medications  Scheduled Meds:  acetaminophen  650 mg Oral Q6H   amiodarone  100 mg Oral Daily   amoxicillin-clavulanate  1 tablet Oral  Q12H   apixaban  5 mg Oral BID   pantoprazole (PROTONIX) IV  40 mg Intravenous Daily   Continuous Infusions:   PRN Meds:.baclofen, benzonatate, fentaNYL (SUBLIMAZE) injection, hydrALAZINE, metoprolol tartrate, ondansetron (ZOFRAN) IV, oxyCODONE, prochlorperazine, sodium chloride    Objective:   Vitals:   02/10/22 1949 02/10/22 2327 02/11/22 0346 02/11/22 0743  BP: 132/74 120/76 113/69 (!) 141/85  Pulse: 60 60 (!) 46 (!) 51  Resp: 19 16 18 17   Temp: 98.4 F (36.9 C) 98 F (36.7 C) 97.9 F (36.6 C) 97.7 F (36.5 C)  TempSrc: Oral Oral Oral Oral  SpO2: 99% 93% 98% 95%  Weight:      Height:        Wt Readings from Last 3 Encounters:  02/07/22 114.8 kg  02/06/22 114.8 kg  01/27/22 114.9 kg     Intake/Output Summary (Last 24 hours) at 02/11/2022 0926 Last data filed at 02/11/2022 0400 Gross per 24 hour  Intake 1688.22 ml  Output --  Net 1688.22 ml     Physical Exam  Awake Alert, No new F.N deficits,   [place].AT,PERRAL Supple Neck, No JVD,   Symmetrical Chest wall movement, CTAB RRR,No Gallops,Rubs or new Murmurs,  few B. Sounds, Abd Soft,  No Cyanosis, Clubbing or edema       Data Review:    Recent Labs  Lab 02/07/22 1728 02/08/22 0226 02/09/22 0119 02/10/22 0137 02/11/22 0127  WBC 16.9* 13.7* 13.6* 9.1 5.6  HGB 12.5* 10.6* 9.8* 10.3* 9.0*  HCT 39.1 32.6* 31.3* 32.5* 28.5*  PLT 377 294 319 362 310  MCV 76.1* 75.8* 77.1* 76.3*  77.4*  MCH 24.3* 24.7* 24.1* 24.2* 24.5*  MCHC 32.0 32.5 31.3 31.7 31.6  RDW 17.6* 17.5* 17.5* 17.5* 17.2*  LYMPHSABS 0.7 0.3* 0.5* 0.7 0.8  MONOABS 1.1* 0.8 1.0 1.0 0.7  EOSABS 0.0 0.0 0.0 0.0 0.1  BASOSABS 0.0 0.0 0.0 0.0 0.0    Recent Labs  Lab 02/06/22 1351 02/07/22 1728 02/07/22 1953 02/08/22 0226 02/09/22 0119 02/10/22 0137 02/11/22 0127  NA  --  135  --  137 138 140 140  K  --  3.9  --  4.2 4.1 3.9 3.8  CL  --  100  --  103 105 108 111  CO2  --  24  --  23 23 23 23   ANIONGAP  --  11  --  11 10 9 6   GLUCOSE   --  127*  --  135* 122* 98 96  BUN  --  42*  --  44* 51* 41* 28*  CREATININE  --  3.52*  --  3.62* 2.84* 2.16* 1.65*  AST  --  16  --  14* 14* 14* 12*  ALT  --  11  --  10 10 11 10   ALKPHOS  --  60  --  49 45 41 37*  BILITOT  --  1.6*  --  1.5* 1.2 1.1 0.8  ALBUMIN  --  3.4*  --  2.8* 2.6* 2.8* 2.4*  LATICACIDVEN 1.5 1.4 1.2  --   --   --   --   INR  --   --   --  1.8*  --   --   --   TSH  --   --   --   --   --   --  0.232*  BNP  --   --   --  52.1 82.0 82.6 168.8*  MG  --   --   --  2.0 2.3 2.3 2.2  CALCIUM  --  8.9  --  8.2* 8.3* 8.5* 8.3*     Radiology Reports DG Chest Port 1 View  Result Date: 02/08/2022 CLINICAL DATA:  Shortness of breath. EXAM: PORTABLE CHEST 1 VIEW COMPARISON:  07/11/2020 FINDINGS: Subsegmental atelectasis noted at the left lung base. Left lung is clear. The cardio pericardial silhouette is enlarged. Telemetry leads overlie the chest. NG tube tip is positioned in the mid to distal esophagus. Trace lucency noted under the right hemidiaphragm. Telemetry leads overlie the chest. IMPRESSION: 1. NG tube tip is positioned in the mid to distal esophagus. Advancing this 2 15-20 cm should place the tip in the stomach with follow-up x-ray to confirm position at that time. 2. Trace lucency under the right hemidiaphragm. While trace intraperitoneal free air could have this appearance, the patient had a abdomen/pelvis CT about 13 hours ago, demonstrating no intraperitoneal free air that time. Right-side-up decubitus film may prove helpful to further evaluate as clinically warranted. Electronically Signed   By: Misty Stanley M.D.   On: 02/08/2022 11:20   CT CHEST ABDOMEN PELVIS WO CONTRAST  Result Date: 02/07/2022 CLINICAL DATA:  c/f recurrence strangulated hernia SBO EXAM: CT CHEST, ABDOMEN AND PELVIS WITHOUT CONTRAST TECHNIQUE: Multidetector CT imaging of the chest, abdomen and pelvis was performed following the standard protocol without IV contrast. RADIATION DOSE REDUCTION: This  exam was performed according to the departmental dose-optimization program which includes automated exposure control, adjustment of the mA and/or kV according to patient size and/or use of iterative reconstruction technique. COMPARISON:  CT abdomen pelvis 02/06/2022  FINDINGS: CT CHEST FINDINGS Cardiovascular: Prominent heart size. No significant pericardial effusion. The thoracic aorta is normal in caliber. No atherosclerotic plaque of the thoracic aorta. No coronary artery calcifications. Mediastinum/Nodes: No enlarged mediastinal, hilar, or axillary lymph nodes. Thyroid gland, trachea, and esophagus demonstrate no significant findings. Moderate volume hiatal hernia with enteric tube tip terminating in the hiatal hernia. Enteric tube side port at the gastroesophageal junction that is located within the mediastinum given hernia. Lungs/Pleura: Peribronchovascular right middle and right lower lobe ground-glass airspace opacities. Linear atelectasis of the right lower lobe. No pulmonary nodule. No pulmonary mass. No pleural effusion. No pneumothorax. Musculoskeletal: No chest wall abnormality. No suspicious lytic or blastic osseous lesions. No acute displaced fracture. Multilevel degenerative changes of the spine. CT ABDOMEN PELVIS FINDINGS Hepatobiliary: No focal liver abnormality. No gallstones, gallbladder wall thickening, or pericholecystic fluid. No biliary dilatation. Pancreas: Diffusely mildly atrophic. No focal lesion. Otherwise normal pancreatic contour. No surrounding inflammatory changes. No main pancreatic ductal dilatation. Spleen: Normal in size without focal abnormality. Adrenals/Urinary Tract: No adrenal nodule bilaterally. No nephrolithiasis and no hydronephrosis. Simple appearing parapelvic cysts. Simple fluid dense parenchymal lesion within the kidneys likely represent simple renal cysts. Simple renal cysts, in the absence of clinically indicated signs/symptoms, require no independent follow-up. No  ureterolithiasis or hydroureter. The urinary bladder is unremarkable. Excretion of previously administered intravenous contrast noted within bilateral kidneys. Stomach/Bowel: Stomach is within normal limits. Interval increase in proximal mid small bowel fluid dilatation measuring up to 5.1 cm with associated air-fluid levels. Transition point noted at the supraumbilical ventral hernia containing a short loop of small bowel. (3:89). Associated mesenteric fat stranding of the contained loop of bowel within the hernia as well as the mesentery outside of the hernia sac. No pneumatosis. No evidence of large bowel wall thickening or dilatation. Appendix appears normal. Vascular/Lymphatic: No portal venous or mesenteric venous gas. No abdominal aorta or iliac aneurysm. Mild atherosclerotic plaque of the aorta and its branches. No abdominal, pelvic, or inguinal lymphadenopathy. Reproductive: Prostate is unremarkable. Other: No intraperitoneal free fluid. No intraperitoneal free gas. No organized fluid collection. Musculoskeletal: Trace fat containing right inguinal hernia. Left inguinal hernia repair with mesh with no recurrence. Diffuse subcutaneus soft tissue edema of the anterior abdominal wall surrounding the ventral hernia. No suspicious lytic or blastic osseous lesions. No acute displaced fracture. Multilevel degenerative changes of the spine. Chronic appearing mild L1 anterior wedge compression fracture. IMPRESSION: 1. Interval worsening of small-bowel obstruction due to incarcerated supraumbilical ventral hernia containing a short loop of small bowel. Mesenteric fat stranding of the associated small bowel is concerning for ischemia; however, there is limited evaluation for associated ischemia due to noncontrast study. No associated bowel perforation. Recommend emergent surgical consultation. 2. Associated right middle and right lower lobe aspiration pneumonia. 3. Moderate volume hiatal hernia with enteric tube tip  terminating in the hiatal hernia. Enteric tube side port at the gastroesophageal junction that is located within the mediastinum given hernia. 4. Trace fat containing right inguinal hernia. 5. Left inguinal hernia repair with mesh with no recurrence. 6. Prominent heart size. 7.  Aortic Atherosclerosis (ICD10-I70.0). These results were called by telephone at the time of interpretation on 02/07/2022 at 10:25 pm to provider Georgina Snell , who verbally acknowledged these results. Electronically Signed   By: Iven Finn M.D.   On: 02/07/2022 22:36   DG Abd Portable 1 View  Result Date: 02/07/2022 CLINICAL DATA:  NG placement EXAM: PORTABLE ABDOMEN - 1 VIEW COMPARISON:  CT chest  abdomen pelvis 02/07/2022 FINDINGS: Enteric tube noted with tip in the expected region of the gastroesophageal junction-given small hiatal hernia, likely within the gastric lumen. Gaseous dilatation of loops of small bowel in the left upper abdomen. No radio-opaque calculi or other significant radiographic abnormality are seen. Right lower lung zone linear atelectasis versus scarring. Left base atelectasis. IMPRESSION: 1. Enteric tube noted with tip in the expected region of the gastroesophageal junction-given small hiatal hernia, likely within the gastric lumen. Please see separately dictated CT abdomen pelvis 02/07/2022. 2. Small-bowel obstruction. Please see separately dictated CT abdomen pelvis 02/07/2022. Electronically Signed   By: Iven Finn M.D.   On: 02/07/2022 22:14      Signature  -   Lala Lund M.D on 02/11/2022 at 9:26 AM   -  To page go to [email or web address]

## 2022-02-11 NOTE — Progress Notes (Signed)
Rounding Note    Patient Name: [name] Date of Encounter: 02/11/2022  [place] Cardiologist: Jenkins Rouge, MD   Subjective   Denies any CP or SOB. Had 1 BM yesterday and 1 BM this morning. Stool formed.   Inpatient Medications    Scheduled Meds:  acetaminophen  650 mg Oral Q6H   amiodarone  100 mg Oral Daily   amoxicillin-clavulanate  1 tablet Oral Q12H   apixaban  5 mg Oral BID   furosemide  20 mg Oral Once   pantoprazole (PROTONIX) IV  40 mg Intravenous Daily   potassium chloride  40 mEq Oral Once   Continuous Infusions:   PRN Meds: baclofen, benzonatate, fentaNYL (SUBLIMAZE) injection, hydrALAZINE, metoprolol tartrate, ondansetron (ZOFRAN) IV, oxyCODONE, prochlorperazine, sodium chloride   Vital Signs    Vitals:   02/10/22 1949 02/10/22 2327 02/11/22 0346 02/11/22 0743  BP: 132/74 120/76 113/69 (!) 141/85  Pulse: 60 60 (!) 46 (!) 51  Resp: 19 16 18 17   Temp: 98.4 F (36.9 C) 98 F (36.7 C) 97.9 F (36.6 C) 97.7 F (36.5 C)  TempSrc: Oral Oral Oral Oral  SpO2: 99% 93% 98% 95%  Weight:      Height:        Intake/Output Summary (Last 24 hours) at 02/11/2022 0829 Last data filed at 02/11/2022 0400 Gross per 24 hour  Intake 1688.22 ml  Output --  Net 1688.22 ml       02/07/2022    5:30 PM 02/06/2022   11:23 AM 01/27/2022    1:14 PM  Last 3 Weights  Weight (lbs) 253 lb 253 lb 253 lb 3.2 oz  Weight (kg) 114.76 kg 114.76 kg 114.851 kg      Telemetry    NSR with single episode of 11 beats run of SVT around 11PM last night, occasional bigeminy - Personally Reviewed  ECG    NSR with RBBB - Personally Reviewed  Physical Exam   GEN: No acute distress.   Neck: No JVD Cardiac: RRR, no murmurs, rubs, or gallops.  Respiratory: Clear to auscultation bilaterally. GI: Soft, nontender, non-distended  MS: No edema; No deformity. Neuro:  Nonfocal  Psych: Normal affect   Labs    High Sensitivity Troponin:  No results for input(s):  "TROPONINIHS" in the last 720 hours.   Chemistry Recent Labs  Lab 02/09/22 0119 02/10/22 0137 02/11/22 0127  NA 138 140 140  K 4.1 3.9 3.8  CL 105 108 111  CO2 23 23 23   GLUCOSE 122* 98 96  BUN 51* 41* 28*  CREATININE 2.84* 2.16* 1.65*  CALCIUM 8.3* 8.5* 8.3*  MG 2.3 2.3 2.2  PROT 5.9* 6.5 5.4*  ALBUMIN 2.6* 2.8* 2.4*  AST 14* 14* 12*  ALT 10 11 10   ALKPHOS 45 41 37*  BILITOT 1.2 1.1 0.8  GFRNONAA 24* 33* 46*  ANIONGAP 10 9 6      Lipids No results for input(s): "CHOL", "TRIG", "HDL", "LABVLDL", "LDLCALC", "CHOLHDL" in the last 168 hours.  Hematology Recent Labs  Lab 02/09/22 0119 02/10/22 0137 02/11/22 0127  WBC 13.6* 9.1 5.6  RBC 4.06* 4.26 3.68*  HGB 9.8* 10.3* 9.0*  HCT 31.3* 32.5* 28.5*  MCV 77.1* 76.3* 77.4*  MCH 24.1* 24.2* 24.5*  MCHC 31.3 31.7 31.6  RDW 17.5* 17.5* 17.2*  PLT 319 362 310    Thyroid  Recent Labs  Lab 02/11/22 0127  TSH 0.232*    BNP Recent Labs  Lab 02/09/22 0119 02/10/22 0137 02/11/22  0127  BNP 82.0 82.6 168.8*     DDimer No results for input(s): "DDIMER" in the last 168 hours.   Radiology    No results found.  Cardiac Studies   Echo 09/04/2021 1. Left ventricular ejection fraction, by estimation, is 45 to 50%. The  left ventricle has mildly decreased function. The left ventricle  demonstrates global hypokinesis. The left ventricular internal cavity size  was severely dilated. Left ventricular  diastolic parameters are consistent with Grade I diastolic dysfunction  (impaired relaxation).   2. Right ventricular systolic function is normal. The right ventricular  size is normal. There is normal pulmonary artery systolic pressure.   3. Left atrial size was moderately dilated.   4. The mitral valve is normal in structure. Mild, eccentric, mitral valve  regurgitation. No evidence of mitral stenosis.   5. The aortic valve is tricuspid. Aortic valve regurgitation is not  visualized. No aortic stenosis is present.   6.  The inferior vena cava is normal in size with greater than 50%  respiratory variability, suggesting right atrial pressure of 3 mmHg.   Comparison(s): Prior images reviewed side by side. LV size has increased  from 2020-> LVIDd 6.3 cm -> 6.9 cm.   Patient Profile     67 y.o. male with PMH of persistent atrial fibrillation (h/o ablation 2021, recently underwent outpatient DCCV on 12/19, scheduled to see Dr. Myles Gip on 1/23 to consider repeat ablation), tachy-mediated cardiomyopathy, HTN, CKD and OSA who is admitted with strangulated abdominal wall hernia and cardiology service consulted given the recent cardioversion and on Eliquis. Patient underwent laparotomy, repair of strangulated incisional ventral hernia with partial omentectomy and small bowel resection on 02/08/2022. He has been maintaining NSR during this hospitalization. Hospital course complicated by AKI, previous baseline Cr 1.6-1.9. Cr peaked on 1/15 at 3.6. Came down to 2.16 on 1/17.   Assessment & Plan    Persistent atrial fibrillation  - previously underwent afib ablation 03/16/2019 by Dr. Rayann Heman  - recently underwent outpatient DCCV on 12/19, planned to see Dr. Myles Gip to consider repeat ablation  - Eliquis was stopped prior to abdominal surgery, was near the end of week 4 of Eliquis after recent cardioversion. Currently maintaining NSR during this hospitalization. Able to take oral intake now, Eliquis has been ordered to start this morning, once Eliquis given, can D/C IV heparin. Restart 100mg  amiodarone. HR already in the low 50s, likely would not tolerate anything higher than 100mg  of amiodarone.   Strangulated abdominal wall hernia: underwent laparotomy, repair of strangulated incisional ventral hernia with partial omentectomy and small bowel resection on 02/08/2022  Chronic combined systolic and diastolic CHF: last echo 03/06/4707 EF 45-50%. Home Entresto, eplerenone and lasix held given NPO status and AKI. Cr has improved to  baseline of 1.6. Primary team ordered a single dose of lasix. BNP went up from 82 yesterday to 168 today. Patient still appears to be euvolemic on exam. Once BP improve, likely will resume entresto. Potentially make lasix PRN instead to avoid AKI again (previously on 40mg  lasix taken Mon and Fri only). May start eplerenone later.   Suspected aspiration PNA: placed on IV zosyn by primary team  AKI: previous baseline Cr 1.6-1.9. Cr peaked on 1/15 at 3.6. Came down to 2.16 on 1/17, down further to 1.65 this morning.   Bradycardia: HR 50s, asymptomatic  Anemia: per primary team. Hgb dropped from 10.3 to 9.0 this morning. Need close monitoring with reinitiation of Eliquis  OSA: not on CPAP  For questions or updates, please contact [place] Please consult [email or web address] for contact info under        Signed, Almyra Deforest, [place]  02/11/2022, 8:29 AM

## 2022-02-11 NOTE — Progress Notes (Signed)
Progress Note  4 Days Post-Op  Subjective: Pt reports pain overall well controlled, only needed tylenol yesterday. Tolerating CLD and having bowel function. Lives with wife and adult son. He hopes to be able to get home tomorrow.   Objective: Vital signs in last 24 hours: Temp:  [97.7 F (36.5 C)-98.4 F (36.9 C)] 97.7 F (36.5 C) (01/18 0743) Pulse Rate:  [46-61] 51 (01/18 0743) Resp:  [16-19] 17 (01/18 0743) BP: (113-144)/(69-85) 141/85 (01/18 0743) SpO2:  [93 %-99 %] 95 % (01/18 0743) Last BM Date :  (PTA)  Intake/Output from previous day: 01/17 0701 - 01/18 0700 In: 1688.2 [P.O.:240; I.V.:1298.2; IV Piggyback:150] Out: -  Intake/Output this shift: No intake/output data recorded.  PE: General: pleasant, WD, obese male who is sitting in chair in NAD Heart: regular, rate, and rhythm.   Lungs: Respiratory effort nonlabored Abd: soft, appropriately ttp, honeycomb to midline incision, mild distention  Psych: A&Ox3 with an appropriate affect.    Lab Results:  Recent Labs    02/10/22 0137 02/11/22 0127  WBC 9.1 5.6  HGB 10.3* 9.0*  HCT 32.5* 28.5*  PLT 362 310    BMET Recent Labs    02/10/22 0137 02/11/22 0127  NA 140 140  K 3.9 3.8  CL 108 111  CO2 23 23  GLUCOSE 98 96  BUN 41* 28*  CREATININE 2.16* 1.65*  CALCIUM 8.5* 8.3*    PT/INR No results for input(s): "LABPROT", "INR" in the last 72 hours.  CMP     Component Value Date/Time   NA 140 02/11/2022 0127   NA 140 02/03/2021 1526   K 3.8 02/11/2022 0127   CL 111 02/11/2022 0127   CO2 23 02/11/2022 0127   GLUCOSE 96 02/11/2022 0127   BUN 28 (H) 02/11/2022 0127   BUN 22 02/03/2021 1526   CREATININE 1.65 (H) 02/11/2022 0127   CALCIUM 8.3 (L) 02/11/2022 0127   PROT 5.4 (L) 02/11/2022 0127   PROT 6.7 01/20/2021 1557   ALBUMIN 2.4 (L) 02/11/2022 0127   ALBUMIN 4.0 01/20/2021 1557   AST 12 (L) 02/11/2022 0127   ALT 10 02/11/2022 0127   ALKPHOS 37 (L) 02/11/2022 0127   BILITOT 0.8 02/11/2022  0127   BILITOT 0.5 01/20/2021 1557   GFRNONAA 46 (L) 02/11/2022 0127   GFRAA 52 (L) 09/21/2019 1658   Lipase     Component Value Date/Time   LIPASE 36 02/06/2022 1129       Studies/Results: No results found.  Anti-infectives: Anti-infectives (From admission, onward)    Start     Dose/Rate Route Frequency Ordered Stop   02/11/22 1000  amoxicillin-clavulanate (AUGMENTIN) 875-125 MG per tablet 1 tablet        1 tablet Oral Every 12 hours 02/11/22 0609 02/15/22 0959   02/08/22 0200  piperacillin-tazobactam (ZOSYN) IVPB 3.375 g  Status:  Discontinued        3.375 g 12.5 mL/hr over 240 Minutes Intravenous Every 8 hours 02/07/22 1933 02/11/22 0609   02/07/22 2300  vancomycin (VANCOREADY) IVPB 1500 mg/300 mL        1,500 mg 150 mL/hr over 120 Minutes Intravenous  Once 02/07/22 2249 02/08/22 0157   02/07/22 1945  piperacillin-tazobactam (ZOSYN) IVPB 3.375 g        3.375 g 100 mL/hr over 30 Minutes Intravenous  Once 02/07/22 1931 02/07/22 2044        Assessment/Plan  POD3 s/p  open primary repair of strangulated incisional ventral hernia, partial omentectomy, small bowel resection  02/08/22 Dr. Kae Heller - advance to soft diet  - mobilize, PT/OT - binder when OOB - ok to resume eliquis from surgery standpoint - likely ready for DC tomorrow from surgery standpoint, will start working on follow up   FEN: soft diet, SLIV VTE: SCDs, hep gtt ID: [id number] 1/14>> continue through POD5  - below per TRH -  Atrial fibrillation on Eliquis CHF HTN Lymphocytic colitis  HLD BPH OSA  LOS: 4 days    Norm Parcel, [facility] 02/11/2022, 9:05 AM Please see Amion for pager number during day hours 7:00am-4:30pm

## 2022-02-11 NOTE — Care Management Important Message (Signed)
Important Message  Patient Details  Name: [name] MRN: [id number] Date of Birth: [birth year]   Medicare Important Message Given:  Yes     Shelda Altes 02/11/2022, 10:25 AM

## 2022-02-11 NOTE — Progress Notes (Signed)
ANTICOAGULATION CONSULT NOTE - follow-up  Pharmacy Consult for IV heparin >> back to Eliquis Indication: atrial fibrillation  Allergies  Allergen Reactions   Ibuprofen Other (See Comments)    Has a-Fib, unable to take   Isosorbide Other (See Comments)    HEADACHES from Imdur   Lisinopril Cough   Digoxin And Related Rash   Spironolactone Rash    Patient Measurements: Height: 6' (182.9 cm) Weight: 114.8 kg (253 lb) IBW/kg (Calculated) : 77.6 Heparin Dosing Weight: 102.3 kg  Vital Signs: Temp: 97.9 F (36.6 C) (01/18 0346) Temp Source: Oral (01/18 0346) BP: 113/69 (01/18 0346) Pulse Rate: 46 (01/18 0346)  Labs: Recent Labs    02/09/22 0119 02/09/22 1032 02/09/22 1808 02/10/22 0137 02/10/22 0936 02/10/22 1759 02/11/22 0127  HGB 9.8*  --   --  10.3*  --   --  9.0*  HCT 31.3*  --   --  32.5*  --   --  28.5*  PLT 319  --   --  362  --   --  310  APTT  --   --    < > 57* 66* 74* 102*  HEPARINUNFRC  --  >1.10*  --  >1.10*  --   --  1.02*  CREATININE 2.84*  --   --  2.16*  --   --  1.65*   < > = values in this interval not displayed.     Estimated Creatinine Clearance: 57.6 mL/min (A) (by C-G formula based on SCr of 1.65 mg/dL (H)).   Medical History: Past Medical History:  Diagnosis Date   Asthma, persistent    BPH (benign prostatic hyperplasia) 2014   CHF (congestive heart failure) (HCC)    Combined hyperlipidemia    Dysrhythmia    ATRIAL FIBRILLATION   GERD (gastroesophageal reflux disease)    History of kidney stones    Hypertension    Lymphocytic colitis 07/2011   MICROSCOPIC   Obesity    Persistent atrial fibrillation ([place])    Renal stone 04/2012   Seasonal allergic rhinitis    Sleep apnea    does not use a cpap machine    Medications:  Scheduled:   acetaminophen  650 mg Oral Q6H   amoxicillin-clavulanate  1 tablet Oral Q12H   furosemide  20 mg Oral Once   pantoprazole (PROTONIX) IV  40 mg Intravenous Daily   potassium chloride  40 mEq Oral  Once    Assessment: 67 yo male presented w/ abdominal wall hernia and is s/p laparotomy, repair of hernia, and small bowel resection on 1/15. PMH includes Afib on Eliquis PTA (last dose 1/14) and s/p DCCV in December 2023. CHADS-VASc = ~3. Has been on [facility] for VTE ppx post-op (last dose 1/16 @0640 ). Pharmacy consulted to transition IV heparin back to Eliquis.  Goal of Therapy:  Monitor platelets by anticoagulation protocol: Yes   Plan:  Resume Eliquis 5mg  PO BID at time of heparin discontinuation Discontinue heparin IV drip   Dimple Nanas, PharmD, BCPS 02/11/2022 7:43 AM

## 2022-02-11 NOTE — Progress Notes (Signed)
Notified provider of bradycardia overnight.

## 2022-02-12 ENCOUNTER — Other Ambulatory Visit (HOSPITAL_COMMUNITY): Payer: Self-pay

## 2022-02-12 DIAGNOSIS — K430 Incisional hernia with obstruction, without gangrene: Secondary | ICD-10-CM | POA: Diagnosis not present

## 2022-02-12 LAB — CBC WITH DIFFERENTIAL/PLATELET
Abs Immature Granulocytes: 0.06 10*3/uL (ref 0.00–0.07)
Basophils Absolute: 0 10*3/uL (ref 0.0–0.1)
Basophils Relative: 0 %
Eosinophils Absolute: 0.3 10*3/uL (ref 0.0–0.5)
Eosinophils Relative: 5 %
HCT: 29.1 % — ABNORMAL LOW (ref 39.0–52.0)
Hemoglobin: 9.5 g/dL — ABNORMAL LOW (ref 13.0–17.0)
Immature Granulocytes: 1 %
Lymphocytes Relative: 15 %
Lymphs Abs: 0.8 10*3/uL (ref 0.7–4.0)
MCH: 24.8 pg — ABNORMAL LOW (ref 26.0–34.0)
MCHC: 32.6 g/dL (ref 30.0–36.0)
MCV: 76 fL — ABNORMAL LOW (ref 80.0–100.0)
Monocytes Absolute: 0.6 10*3/uL (ref 0.1–1.0)
Monocytes Relative: 12 %
Neutro Abs: 3.7 10*3/uL (ref 1.7–7.7)
Neutrophils Relative %: 67 %
Platelets: 287 10*3/uL (ref 150–400)
RBC: 3.83 MIL/uL — ABNORMAL LOW (ref 4.22–5.81)
RDW: 17 % — ABNORMAL HIGH (ref 11.5–15.5)
WBC: 5.4 10*3/uL (ref 4.0–10.5)
nRBC: 0 % (ref 0.0–0.2)

## 2022-02-12 LAB — COMPREHENSIVE METABOLIC PANEL
ALT: 10 U/L (ref 0–44)
AST: 11 U/L — ABNORMAL LOW (ref 15–41)
Albumin: 2.4 g/dL — ABNORMAL LOW (ref 3.5–5.0)
Alkaline Phosphatase: 35 U/L — ABNORMAL LOW (ref 38–126)
Anion gap: 10 (ref 5–15)
BUN: 25 mg/dL — ABNORMAL HIGH (ref 8–23)
CO2: 23 mmol/L (ref 22–32)
Calcium: 8.4 mg/dL — ABNORMAL LOW (ref 8.9–10.3)
Chloride: 107 mmol/L (ref 98–111)
Creatinine, Ser: 1.68 mg/dL — ABNORMAL HIGH (ref 0.61–1.24)
GFR, Estimated: 45 mL/min — ABNORMAL LOW (ref >60)
Glucose, Bld: 89 mg/dL (ref 70–99)
Potassium: 3.8 mmol/L (ref 3.5–5.1)
Sodium: 140 mmol/L (ref 135–145)
Total Bilirubin: 0.5 mg/dL (ref 0.3–1.2)
Total Protein: 5.2 g/dL — ABNORMAL LOW (ref 6.5–8.1)

## 2022-02-12 LAB — BRAIN NATRIURETIC PEPTIDE: B Natriuretic Peptide: 151.5 pg/mL — ABNORMAL HIGH (ref 0.0–100.0)

## 2022-02-12 LAB — CULTURE, BLOOD (ROUTINE X 2)
Culture: NO GROWTH
Culture: NO GROWTH
Special Requests: ADEQUATE

## 2022-02-12 LAB — MAGNESIUM: Magnesium: 2.1 mg/dL (ref 1.7–2.4)

## 2022-02-12 MED ORDER — AMOXICILLIN-POT CLAVULANATE 875-125 MG PO TABS
1.0000 | ORAL_TABLET | Freq: Two times a day (BID) | ORAL | 0 refills | Status: DC
  Filled 2022-02-12: qty 4, 2d supply, fill #0

## 2022-02-12 MED ORDER — POTASSIUM CHLORIDE CRYS ER 20 MEQ PO TBCR
40.0000 meq | EXTENDED_RELEASE_TABLET | Freq: Once | ORAL | Status: AC
  Administered 2022-02-12: 40 meq via ORAL
  Filled 2022-02-12: qty 2

## 2022-02-12 MED ORDER — OXYCODONE HCL 5 MG PO TABS
5.0000 mg | ORAL_TABLET | Freq: Four times a day (QID) | ORAL | 0 refills | Status: DC | PRN
  Filled 2022-02-12: qty 15, 4d supply, fill #0

## 2022-02-12 MED ORDER — PROMETHAZINE HCL 25 MG PO TABS
25.0000 mg | ORAL_TABLET | Freq: Three times a day (TID) | ORAL | 0 refills | Status: DC | PRN
  Filled 2022-02-12: qty 10, 4d supply, fill #0

## 2022-02-12 NOTE — Progress Notes (Signed)
Nursing DC note  Patient alert and oriented, verbalized understanding of dc instructions. TOC meds and DC papers given to patient. Ccmd notified of dc order.

## 2022-02-12 NOTE — Discharge Summary (Signed)
[name] HRC:[id number] DOB: Oct 09, 1955 DOA: 02/07/2022  PCP: [name], MD  Admit date: 02/07/2022  Discharge date: 02/12/2022  Admitted From: Home   Disposition:  Home   Recommendations for Outpatient Follow-up:   Follow up with PCP in 1-2 weeks  PCP Please obtain BMP/CBC, 2 view CXR in 1week,  (see Discharge instructions)   PCP Please follow up on the following pending results: Check TSH, CBC, BMP and magnesium in 7 to 10 days.   Home Health: None   Equipment/Devices: None  Consultations: Cards, CCS Discharge Condition: Stable    CODE STATUS: Full    Diet Recommendation: Heart Healthy     Chief Complaint  Patient presents with   Abdominal Pain     Brief history of present illness from the day of admission and additional interim summary    67 year old male with past medical history of atrial fibrillation, CHF, hypertension, lymphocytic colitis, hyperlipidemia, BPH, sleep apnea.  Said that on 4 PM he developed sudden onset of pain abdominal pain over the incision site where they went to remove kidney stones and per patient where they did work for his robotic assisted prostatectomy.  In the ER his workup was consistent with strangulated abdominal wall hernia, general surgery was consulted and he was emergently taken to the OR and hospitalist team was requested to admit the patient.                                                                  Hospital Course    Strangulated abdominal wall hernia.  S/p laparotomy, repair of strangulated incisional ventral hernia with partial omentectomy and small bowel resection on 02/08/2022. He has been seen by general surgery underwent surgical correction on 02/08/2022, NG tube fell out by itself night of 02/09/2022 however patient is not nauseated, now passing  flatus and having bowel movements oral diet being advanced.  He is symptom-free today eager for being discharged, will be discharged on home regimen with outpatient PCP and general surgery follow-up.     Nausea vomiting with suspicion of aspiration pneumonia.  Switch to Augmentin to complete total of 5 days.   AKI CKD 3B with baseline creatinine of around 2.  Due to  nausea vomiting, prerenal azotemia and being on Entresto, was hydrated.  Renal function has now resolved and better than baseline resume home medications PCP to monitor.   BPH.  Continue Flomax.   Chronic systolic heart failure.  EF 45% on TTE done 6 months ago.  By cardiology no acute issues resume home regimen unchanged.   Persistent atrial fibrillation [place] vas 2 score of 4.  Followed by cardiology here currently being replaced on oral amiodarone at home dose Eliquis on 02/11/2022 per cardiology continue to monitor, mild bradycardia at times but asymptomatic and nothing  specific to be done per cardiology.  TSH if anything is slightly on the lower side due to sick euthyroid.   OSA.  Nighttime oxygen if needed does not use CPAP.   COPD.  No acute issues.  Supportive care.   HTN.  Home Rx.    Discharge diagnosis     Principal Problem:   Strangulated incisional hernia s/p SB resction & primary repair 02/08/2022 Active Problems:   Acute combined systolic and diastolic heart failure (HCC)   Persistent atrial fibrillation (HCC)   Secondary hypercoagulable state ([place])   Incarcerated hernia   Aspiration pneumonia (HCC)   Chronic combined systolic and diastolic heart failure (HCC)   Primary hypertension   Hyperlipidemia   OSA (obstructive sleep apnea)   Lymphocytic colitis   AKI (acute kidney injury) ([place])   PAF (paroxysmal atrial fibrillation) ([place])    Discharge instructions    Discharge Instructions     Diet - low sodium heart healthy   Complete by: As directed    Discharge instructions   Complete by: As directed     Follow the post surgery instructions given to you by the general surgery team.  Follow with Primary MD [name], MD in 7 days   Get CBC, CMP, Magnesium, 2 view Chest X ray -  checked next visit with your primary MD   Activity: As tolerated with Full fall precautions use walker/cane & assistance as needed  Disposition Home    Diet: Heart Healthy    Special Instructions: If you have smoked or chewed Tobacco  in the last 2 yrs please stop smoking, stop any regular Alcohol  and or any Recreational drug use.  On your next visit with your primary care physician please Get Medicines reviewed and adjusted.  Please request your Prim.MD to go over all Hospital Tests and Procedure/Radiological results at the follow up, please get all Hospital records sent to your Prim MD by signing hospital release before you go home.  If you experience worsening of your admission symptoms, develop shortness of breath, life threatening emergency, suicidal or homicidal thoughts you must seek medical attention immediately by calling 911 or calling your MD immediately  if symptoms less severe.  You Must read complete instructions/literature along with all the possible adverse reactions/side effects for all the Medicines you take and that have been prescribed to you. Take any new Medicines after you have completely understood and accpet all the possible adverse reactions/side effects.   Increase activity slowly   Complete by: As directed        Discharge Medications      Follow-up Information     [facility] [place] Follow up.   Specialty: General Surgery Contact information: [address] [place] [address] [phone]         Maczis, Carlena Hurl, PA-C Follow up.   Specialty: General Surgery Contact information: [place] [address] [phone]         [name], MD. Schedule an appointment as soon as  possible for a visit in 1 week(s).   Specialty: Internal Medicine Why: and your Cardiologist in 1 week Contact information: [place] [place] [address] [phone]                 Major procedures and Radiology Reports - PLEASE review detailed and final reports thoroughly  -         DG Chest [facility] [address] 1  View  Result Date: 02/08/2022 CLINICAL DATA:  Shortness of breath. EXAM: PORTABLE CHEST 1 VIEW COMPARISON:  07/11/2020 FINDINGS: Subsegmental atelectasis noted at the left lung base. Left lung is clear. The cardio pericardial silhouette is enlarged. Telemetry leads overlie the chest. NG tube tip is positioned in the mid to distal esophagus. Trace lucency noted under the right hemidiaphragm. Telemetry leads overlie the chest. IMPRESSION: 1. NG tube tip is positioned in the mid to distal esophagus. Advancing this 2 15-20 cm should place the tip in the stomach with follow-up x-ray to confirm position at that time. 2. Trace lucency under the right hemidiaphragm. While trace intraperitoneal free air could have this appearance, the patient had a abdomen/pelvis CT about 13 hours ago, demonstrating no intraperitoneal free air that time. Right-side-up decubitus film may prove helpful to further evaluate as clinically warranted. Electronically Signed   By: Misty Stanley M.D.   On: 02/08/2022 11:20   CT CHEST ABDOMEN PELVIS WO CONTRAST  Result Date: 02/07/2022 CLINICAL DATA:  c/f recurrence strangulated hernia SBO EXAM: CT CHEST, ABDOMEN AND PELVIS WITHOUT CONTRAST TECHNIQUE: Multidetector CT imaging of the chest, abdomen and pelvis was performed following the standard protocol without IV contrast. RADIATION DOSE REDUCTION: This exam was performed according to the departmental dose-optimization program which includes automated exposure control, adjustment of the mA and/or kV according to patient size and/or use of iterative reconstruction technique. COMPARISON:  CT abdomen pelvis  02/06/2022 FINDINGS: CT CHEST FINDINGS Cardiovascular: Prominent heart size. No significant pericardial effusion. The thoracic aorta is normal in caliber. No atherosclerotic plaque of the thoracic aorta. No coronary artery calcifications. Mediastinum/Nodes: No enlarged mediastinal, hilar, or axillary lymph nodes. Thyroid gland, trachea, and esophagus demonstrate no significant findings. Moderate volume hiatal hernia with enteric tube tip terminating in the hiatal hernia. Enteric tube side port at the gastroesophageal junction that is located within the mediastinum given hernia. Lungs/Pleura: Peribronchovascular right middle and right lower lobe ground-glass airspace opacities. Linear atelectasis of the right lower lobe. No pulmonary nodule. No pulmonary mass. No pleural effusion. No pneumothorax. Musculoskeletal: No chest wall abnormality. No suspicious lytic or blastic osseous lesions. No acute displaced fracture. Multilevel degenerative changes of the spine. CT ABDOMEN PELVIS FINDINGS Hepatobiliary: No focal liver abnormality. No gallstones, gallbladder wall thickening, or pericholecystic fluid. No biliary dilatation. Pancreas: Diffusely mildly atrophic. No focal lesion. Otherwise normal pancreatic contour. No surrounding inflammatory changes. No main pancreatic ductal dilatation. Spleen: Normal in size without focal abnormality. Adrenals/Urinary Tract: No adrenal nodule bilaterally. No nephrolithiasis and no hydronephrosis. Simple appearing parapelvic cysts. Simple fluid dense parenchymal lesion within the kidneys likely represent simple renal cysts. Simple renal cysts, in the absence of clinically indicated signs/symptoms, require no independent follow-up. No ureterolithiasis or hydroureter. The urinary bladder is unremarkable. Excretion of previously administered intravenous contrast noted within bilateral kidneys. Stomach/Bowel: Stomach is within normal limits. Interval increase in proximal mid small bowel  fluid dilatation measuring up to 5.1 cm with associated air-fluid levels. Transition point noted at the supraumbilical ventral hernia containing a short loop of small bowel. (3:89). Associated mesenteric fat stranding of the contained loop of bowel within the hernia as well as the mesentery outside of the hernia sac. No pneumatosis. No evidence of large bowel wall thickening or dilatation. Appendix appears normal. Vascular/Lymphatic: No portal venous or mesenteric venous gas. No abdominal aorta or iliac aneurysm. Mild atherosclerotic plaque of the aorta and its branches. No abdominal, pelvic, or inguinal lymphadenopathy. Reproductive: Prostate is unremarkable. Other: No intraperitoneal free fluid. No  intraperitoneal free gas. No organized fluid collection. Musculoskeletal: Trace fat containing right inguinal hernia. Left inguinal hernia repair with mesh with no recurrence. Diffuse subcutaneus soft tissue edema of the anterior abdominal wall surrounding the ventral hernia. No suspicious lytic or blastic osseous lesions. No acute displaced fracture. Multilevel degenerative changes of the spine. Chronic appearing mild L1 anterior wedge compression fracture. IMPRESSION: 1. Interval worsening of small-bowel obstruction due to incarcerated supraumbilical ventral hernia containing a short loop of small bowel. Mesenteric fat stranding of the associated small bowel is concerning for ischemia; however, there is limited evaluation for associated ischemia due to noncontrast study. No associated bowel perforation. Recommend emergent surgical consultation. 2. Associated right middle and right lower lobe aspiration pneumonia. 3. Moderate volume hiatal hernia with enteric tube tip terminating in the hiatal hernia. Enteric tube side port at the gastroesophageal junction that is located within the mediastinum given hernia. 4. Trace fat containing right inguinal hernia. 5. Left inguinal hernia repair with mesh with no recurrence. 6.  Prominent heart size. 7.  Aortic Atherosclerosis (ICD10-I70.0). These results were called by telephone at the time of interpretation on 02/07/2022 at 10:25 pm to provider Georgina Snell , who verbally acknowledged these results. Electronically Signed   By: Iven Finn M.D.   On: 02/07/2022 22:36   DG Abd Portable 1 View  Result Date: 02/07/2022 CLINICAL DATA:  NG placement EXAM: PORTABLE ABDOMEN - 1 VIEW COMPARISON:  CT chest abdomen pelvis 02/07/2022 FINDINGS: Enteric tube noted with tip in the expected region of the gastroesophageal junction-given small hiatal hernia, likely within the gastric lumen. Gaseous dilatation of loops of small bowel in the left upper abdomen. No radio-opaque calculi or other significant radiographic abnormality are seen. Right lower lung zone linear atelectasis versus scarring. Left base atelectasis. IMPRESSION: 1. Enteric tube noted with tip in the expected region of the gastroesophageal junction-given small hiatal hernia, likely within the gastric lumen. Please see separately dictated CT abdomen pelvis 02/07/2022. 2. Small-bowel obstruction. Please see separately dictated CT abdomen pelvis 02/07/2022. Electronically Signed   By: Iven Finn M.D.   On: 02/07/2022 22:14   CT ABDOMEN PELVIS W CONTRAST  Result Date: 02/06/2022 CLINICAL DATA:  Hernia suspected, abdominal wall large, hardened skin with overlying warmth. Not reproducible. EXAM: CT ABDOMEN AND PELVIS WITH CONTRAST TECHNIQUE: Multidetector CT imaging of the abdomen and pelvis was performed using the standard protocol following bolus administration of intravenous contrast. RADIATION DOSE REDUCTION: This exam was performed according to the departmental dose-optimization program which includes automated exposure control, adjustment of the mA and/or kV according to patient size and/or use of iterative reconstruction technique. CONTRAST:  73mL OMNIPAQUE IOHEXOL 350 MG/ML SOLN COMPARISON:  07/31/2018 FINDINGS: Lower  chest: Mild right basilar atelectasis. Moderate hiatal hernia. Hepatobiliary: No focal liver abnormality is seen. Subtle hyperdensity in the gallbladder could reflect biliary sludge. No hyperdense gallstone. No evidence of wall thickening or pericholecystic inflammatory changes. No biliary dilatation. Pancreas: Unremarkable. No pancreatic ductal dilatation or surrounding inflammatory changes. Spleen: Normal in size without focal abnormality. Adrenals/Urinary Tract: Unremarkable adrenal glands. Multiple bilateral renal cysts including renal sinus cysts, not appreciably changed and do not require dedicated follow-up imaging. No renal stone or evidence of hydronephrosis. Urinary bladder within normal limits. Stomach/Bowel: Moderate-sized hiatal hernia. Stomach is otherwise within normal limits. Small-bowel obstruction secondary to a supraumbilical abdominal wall hernia containing small bowel which is abnormal in appearance. Bowel within the hernia sac is fluid-filled with mucosal hyperenhancement. Surrounding fat stranding. There is dilation of bowel upstream  to the hernia. The small bowel distal to the hernia is decompressed. No focal colonic wall thickening or inflammatory changes. Vascular/Lymphatic: No significant vascular findings are present. No enlarged abdominal or pelvic lymph nodes. Reproductive: Prostate is unremarkable. Other: Prominent fat stranding within the subcutaneous soft tissues surrounding the supraumbilical hernia. No ascites. No pneumoperitoneum. Prior left inguinal hernia repair. Small fat containing right inguinal hernia. Musculoskeletal: No acute osseous abnormality. IMPRESSION: 1. Small-bowel obstruction secondary to a supraumbilical abdominal wall hernia containing small bowel. Distended small bowel within the hernia sac is fluid-filled with mucosal hyperenhancement and surrounding fat stranding. Findings are concerning for strangulation. Surgical consultation is recommended. 2.  Moderate-sized hiatal hernia. 3. Subtle hyperdensity in the gallbladder could reflect biliary sludge. No evidence of acute cholecystitis. 4. Small fat containing right inguinal hernia. These results were called by telephone at the time of interpretation on 02/06/2022 at 2:01 pm to provider Dr. Rondel Oh, who verbally acknowledged these results. Electronically Signed   By: Davina Poke D.O.   On: 02/06/2022 14:06    Micro Results    Recent Results (from the past 240 hour(s))  Blood culture (routine x 2)     Status: None (Preliminary result)   Collection Time: 02/07/22  7:53 PM   Specimen: BLOOD  Result Value Ref Range Status   Specimen Description BLOOD LEFT ANTECUBITAL  Final   Special Requests   Final    BOTTLES DRAWN AEROBIC AND ANAEROBIC Blood Culture results may not be optimal due to an excessive volume of blood received in culture bottles   Culture   Final    NO GROWTH 4 DAYS Performed at [facility] [address] [place], [place] [address]    Report Status PENDING  Incomplete  Blood culture (routine x 2)     Status: None (Preliminary result)   Collection Time: 02/07/22  7:53 PM   Specimen: BLOOD RIGHT HAND  Result Value Ref Range Status   Specimen Description BLOOD RIGHT HAND  Final   Special Requests   Final    BOTTLES DRAWN AEROBIC AND ANAEROBIC Blood Culture adequate volume   Culture   Final    NO GROWTH 4 DAYS Performed at [facility], [place] [address] [place], [place] [address]    Report Status PENDING  Incomplete    Today   Subjective    Numan Zylstra today has no headache,no chest abdominal pain,no new weakness tingling or numbness, feels much better wants to go home today.    Objective   Blood pressure 131/83, pulse (!) 53, temperature 98.3 F (36.8 C), temperature source Oral, resp. rate 13, height 6' (1.829 m), weight 114.8 kg, SpO2 95 %.  No intake or output data in the 24 hours ending 02/12/22 0736  Exam  Awake Alert, No new F.N  deficits,    Williams.AT,PERRAL Supple Neck,   Symmetrical Chest wall movement, Good air movement bilaterally, CTAB RRR,No Gallops,   +ve B.Sounds, Abd Soft, Non tender,  No Cyanosis, Clubbing or edema    Data Review   Recent Labs  Lab 02/08/22 0226 02/09/22 0119 02/10/22 0137 02/11/22 0127 02/12/22 0229  WBC 13.7* 13.6* 9.1 5.6 5.4  HGB 10.6* 9.8* 10.3* 9.0* 9.5*  HCT 32.6* 31.3* 32.5* 28.5* 29.1*  PLT 294 319 362 310 287  MCV 75.8* 77.1* 76.3* 77.4* 76.0*  MCH 24.7* 24.1* 24.2* 24.5* 24.8*  MCHC 32.5 31.3 31.7 31.6 32.6  RDW 17.5* 17.5* 17.5* 17.2* 17.0*  LYMPHSABS 0.3* 0.5* 0.7 0.8 0.8  MONOABS 0.8 1.0 1.0 0.7 0.6  EOSABS 0.0 0.0 0.0 0.1 0.3  BASOSABS 0.0 0.0 0.0 0.0 0.0    Recent Labs  Lab 02/06/22 1351 02/07/22 1728 02/07/22 1953 02/08/22 0226 02/09/22 0119 02/10/22 0137 02/11/22 0127 02/12/22 0229  NA  --  135  --  137 138 140 140 140  K  --  3.9  --  4.2 4.1 3.9 3.8 3.8  CL  --  100  --  103 105 108 111 107  CO2  --  24  --  23 23 23 23 23   ANIONGAP  --  11  --  11 10 9 6 10   GLUCOSE  --  127*  --  135* 122* 98 96 89  BUN  --  42*  --  44* 51* 41* 28* 25*  CREATININE  --  3.52*  --  3.62* 2.84* 2.16* 1.65* 1.68*  AST  --  16  --  14* 14* 14* 12* 11*  ALT  --  11  --  10 10 11 10 10   ALKPHOS  --  60  --  49 45 41 37* 35*  BILITOT  --  1.6*  --  1.5* 1.2 1.1 0.8 0.5  ALBUMIN  --  3.4*  --  2.8* 2.6* 2.8* 2.4* 2.4*  LATICACIDVEN 1.5 1.4 1.2  --   --   --   --   --   INR  --   --   --  1.8*  --   --   --   --   TSH  --   --   --   --   --   --  0.232*  --   BNP  --   --   --  52.1 82.0 82.6 168.8* 151.5*  MG  --   --   --  2.0 2.3 2.3 2.2 2.1  CALCIUM  --  8.9  --  8.2* 8.3* 8.5* 8.3* 8.4*     Total Time in preparing paper work, data evaluation and todays exam - 35 minutes  Signature  -    Lala Lund M.D on 02/12/2022 at 7:36 AM   -  To page go to [email or web address]

## 2022-02-12 NOTE — Progress Notes (Signed)
Progress Note  5 Days Post-Op  Subjective: Pt tolerating diet and pain well controlled. Planning discharge home today   Objective: Vital signs in last 24 hours: Temp:  [97.9 F (36.6 C)-98.3 F (36.8 C)] 98.1 F (36.7 C) (01/19 0835) Pulse Rate:  [51-59] 59 (01/19 0835) Resp:  [13-18] 18 (01/19 0835) BP: (131-141)/(71-89) 136/82 (01/19 0835) SpO2:  [93 %-99 %] 93 % (01/19 0835) Last BM Date : 02/11/22  Intake/Output from previous day: No intake/output data recorded. Intake/Output this shift: No intake/output data recorded.  PE: General: pleasant, WD, obese male who is sitting in chair in NAD Heart: regular, rate, and rhythm.   Lungs: Respiratory effort nonlabored Abd: soft, appropriately ttp, staples to midline incision C/D/I Psych: A&Ox3 with an appropriate affect.    Lab Results:  Recent Labs    02/11/22 0127 02/12/22 0229  WBC 5.6 5.4  HGB 9.0* 9.5*  HCT 28.5* 29.1*  PLT 310 287    BMET Recent Labs    02/11/22 0127 02/12/22 0229  NA 140 140  K 3.8 3.8  CL 111 107  CO2 23 23  GLUCOSE 96 89  BUN 28* 25*  CREATININE 1.65* 1.68*  CALCIUM 8.3* 8.4*    PT/INR No results for input(s): "LABPROT", "INR" in the last 72 hours.  CMP     Component Value Date/Time   NA 140 02/12/2022 0229   NA 140 02/03/2021 1526   K 3.8 02/12/2022 0229   CL 107 02/12/2022 0229   CO2 23 02/12/2022 0229   GLUCOSE 89 02/12/2022 0229   BUN 25 (H) 02/12/2022 0229   BUN 22 02/03/2021 1526   CREATININE 1.68 (H) 02/12/2022 0229   CALCIUM 8.4 (L) 02/12/2022 0229   PROT 5.2 (L) 02/12/2022 0229   PROT 6.7 01/20/2021 1557   ALBUMIN 2.4 (L) 02/12/2022 0229   ALBUMIN 4.0 01/20/2021 1557   AST 11 (L) 02/12/2022 0229   ALT 10 02/12/2022 0229   ALKPHOS 35 (L) 02/12/2022 0229   BILITOT 0.5 02/12/2022 0229   BILITOT 0.5 01/20/2021 1557   GFRNONAA 45 (L) 02/12/2022 0229   GFRAA 52 (L) 09/21/2019 1658   Lipase     Component Value Date/Time   LIPASE 36 02/06/2022 1129        Studies/Results: No results found.  Anti-infectives: Anti-infectives (From admission, onward)    Start     Dose/Rate Route Frequency Ordered Stop   02/12/22 0000  amoxicillin-clavulanate (AUGMENTIN) 875-125 MG tablet        1 tablet Oral Every 12 hours 02/12/22 0734     02/11/22 1000  amoxicillin-clavulanate (AUGMENTIN) 875-125 MG per tablet 1 tablet        1 tablet Oral Every 12 hours 02/11/22 0609 02/15/22 0959   02/08/22 0200  piperacillin-tazobactam (ZOSYN) IVPB 3.375 g  Status:  Discontinued        3.375 g 12.5 mL/hr over 240 Minutes Intravenous Every 8 hours 02/07/22 1933 02/11/22 0609   02/07/22 2300  vancomycin (VANCOREADY) IVPB 1500 mg/300 mL        1,500 mg 150 mL/hr over 120 Minutes Intravenous  Once 02/07/22 2249 02/08/22 0157   02/07/22 1945  piperacillin-tazobactam (ZOSYN) IVPB 3.375 g        3.375 g 100 mL/hr over 30 Minutes Intravenous  Once 02/07/22 1931 02/07/22 2044        Assessment/Plan  POD4 s/p  open primary repair of strangulated incisional ventral hernia, partial omentectomy, small bowel resection 02/08/22 Dr. Kae Heller - tol soft  diet  - mobilize, PT/OT - binder when OOB - stable for DC from surgery standpoint  FEN: soft diet, SLIV VTE: SCDs, eliquis resumed 1/18 ID: [email or web address] 1/14>1/18; PO augmentin 1/18> through tomorrow   - below per [facility] -  Atrial fibrillation on Eliquis CHF HTN Lymphocytic colitis  HLD BPH OSA  LOS: 5 days    Norm Parcel, [facility] 02/12/2022, 8:46 AM Please see Amion for pager number during day hours 7:00am-4:30pm

## 2022-02-16 ENCOUNTER — Ambulatory Visit: Payer: Medicare HMO | Attending: Cardiovascular Disease | Admitting: Cardiovascular Disease

## 2022-02-16 ENCOUNTER — Encounter: Payer: Self-pay | Admitting: Cardiovascular Disease

## 2022-02-16 VITALS — BP 150/76 | HR 60 | Ht 72.0 in | Wt 249.0 lb

## 2022-02-16 DIAGNOSIS — Z01812 Encounter for preprocedural laboratory examination: Secondary | ICD-10-CM

## 2022-02-16 DIAGNOSIS — I4819 Other persistent atrial fibrillation: Secondary | ICD-10-CM | POA: Diagnosis not present

## 2022-02-16 DIAGNOSIS — D6869 Other thrombophilia: Secondary | ICD-10-CM | POA: Diagnosis not present

## 2022-02-16 NOTE — Progress Notes (Signed)
Electrophysiology Office Note:    Date:  02/16/2022   ID:  [name], DOB 11-03-1955, MRN [id number]  PCP:  [name], MD   [facility] Providers Cardiologist:  Jenkins Rouge, MD Electrophysiologist:  Thompson Grayer, MD  Advanced Heart Failure:  Glori Bickers, MD  Sleep Medicine:  Fransico Him, MD     Referring MD: [name], MD   History of Present Illness:    [name] is a 67 y.o. male with a hx listed below, significant for atrial fibrillation, CHF, referred for arrhythmia management.  He underwent A-fib ablation by Dr. Rayann Heman in February 2021.  Per his recollection, he remained in sinus rhythm for at least a year and did reasonably well on amiodarone. He did have recurrence and had a DCCV in December, 2023.   He was discharged in the hospital just 4 days ago after treatment for strangulated hernia. He remained in sinus rhythm during the hospitalization on his dose of 100mg  daily of amiodarone.  Since cardioversion, he has not had palpitations, symptoms otherwise to suggest recurrence of AF.   Past Medical History:  Diagnosis Date   Asthma, persistent    BPH (benign prostatic hyperplasia) 2014   CHF (congestive heart failure) (HCC)    Combined hyperlipidemia    Dysrhythmia    ATRIAL FIBRILLATION   GERD (gastroesophageal reflux disease)    History of kidney stones    Hypertension    Lymphocytic colitis 07/2011   MICROSCOPIC   Obesity    Persistent atrial fibrillation ([facility])    Renal stone 04/2012   Seasonal allergic rhinitis    Sleep apnea    does not use a cpap machine    Past Surgical History:  Procedure Laterality Date   ATRIAL FIBRILLATION ABLATION N/A 03/16/2019   Procedure: ATRIAL FIBRILLATION ABLATION;  Surgeon: Thompson Grayer, MD;  Location: [facility];  Service: Cardiovascular;  Laterality: N/A;   BOWEL RESECTION N/A 02/07/2022   Procedure: SMALL BOWEL RESECTION;  Surgeon: Clovis Riley, MD;  Location: [facility];  Service:  General;  Laterality: N/A;   CARDIOVERSION N/A 04/23/2016   Procedure: CARDIOVERSION;  Surgeon: Jolaine Artist, MD;  Location: [facility] ENDOSCOPY;  Service: Cardiovascular;  Laterality: N/A;   CARDIOVERSION N/A 01/12/2022   Procedure: CARDIOVERSION;  Surgeon: Buford Dresser, MD;  Location: [facility];  Service: Cardiovascular;  Laterality: N/A;   CYSTOSCOPY WITH RETROGRADE PYELOGRAM, URETEROSCOPY AND STENT PLACEMENT Left 07/29/2017   Procedure: CYSTOSCOPY WITH RETROGRADE PYELOGRAM AND LEFT STENT PLACEMENT;  Surgeon: Franchot Gallo, MD;  Location: [facility];  Service: Urology;  Laterality: Left;   EXTRACORPOREAL SHOCK WAVE LITHOTRIPSY Left 08/08/2017   Procedure: LEFT EXTRACORPOREAL SHOCK WAVE LITHOTRIPSY (ESWL);  Surgeon: Nickie Retort, MD;  Location: [facility];  Service: Urology;  Laterality: Left;   LAPAROTOMY N/A 02/07/2022   Procedure: EXPLORATORY LAPAROTOMY;  Surgeon: Clovis Riley, MD;  Location: [facility];  Service: General;  Laterality: N/A;   TEE WITHOUT CARDIOVERSION N/A 04/23/2016   Procedure: TRANSESOPHAGEAL ECHOCARDIOGRAM (TEE);  Surgeon: Jolaine Artist, MD;  Location: [facility] ENDOSCOPY;  Service: Cardiovascular;  Laterality: N/A;   VENTRAL HERNIA REPAIR N/A 02/07/2022   Procedure: REPAIR INCARCERATED VENTRAL  HERNIA;  Surgeon: Clovis Riley, MD;  Location: [facility];  Service: General;  Laterality: N/A;   XI ROBOTIC ASSISTED SIMPLE PROSTATECTOMY N/A 12/25/2018   Procedure: XI ROBOTIC ASSISTED SIMPLE PROSTATECTOMY, REMOVAL OF BLADDER STONES;  Surgeon: Cleon Gustin, MD;  Location: [facility];  Service: Urology;  Laterality:  N/A;    Current Medications: Current Meds  Medication Sig   acetaminophen (TYLENOL) 325 MG tablet Take 2 tablets (650 mg total) by mouth every 6 (six) hours as needed for mild pain or headache.   albuterol (PROVENTIL HFA;VENTOLIN HFA) 108 (90 Base) MCG/ACT inhaler Inhale 2 puffs into the lungs every 6 (six) hours as needed for wheezing or shortness of  breath.   amiodarone (PACERONE) 100 MG tablet Take 1 tablet (100 mg total) by mouth daily. May take an extra tablet daily for breakthrough afib as directed (Patient taking differently: Take 100 mg by mouth daily.)   amLODipine (NORVASC) 5 MG tablet TAKE 1 TABLET (5 MG TOTAL) BY MOUTH DAILY.   apixaban (ELIQUIS) 5 MG TABS tablet Take 1 tablet (5 mg total) by mouth 2 (two) times daily.   docusate sodium (COLACE) 100 MG capsule Take 1 capsule (100 mg total) by mouth every 12 (twelve) hours.   ENTRESTO 97-103 MG TAKE 1 TABLET BY MOUTH TWICE A DAY   eplerenone (INSPRA) 25 MG tablet TAKE 1/2 TABLET BY MOUTH EVERY DAY   fluticasone-salmeterol (ADVAIR DISKUS) 250-50 MCG/ACT AEPB Inhale 1 puff into the lungs in the morning and at bedtime.   furosemide (LASIX) 40 MG tablet Take one tablet by mouth on Mondays and Fridays   ondansetron (ZOFRAN-ODT) 4 MG disintegrating tablet Take 1 tablet (4 mg total) by mouth every 8 (eight) hours as needed for nausea or vomiting.   oxyCODONE (OXY IR/ROXICODONE) 5 MG immediate release tablet Take 1 tablet (5 mg total) by mouth every 6 (six) hours as needed for moderate pain or severe pain (5 mg for moderate, 10 mg for severe).   pantoprazole (PROTONIX) 40 MG tablet Take 40 mg by mouth daily.   promethazine (PHENERGAN) 25 MG tablet Take 1 tablet (25 mg total) by mouth every 8 (eight) hours as needed for nausea or vomiting.   tadalafil (CIALIS) 5 MG tablet Take 5 mg by mouth daily.     Allergies:   Ibuprofen, Isosorbide, Lisinopril, Digoxin and related, and Spironolactone   Social History   Socioeconomic History   Marital status: Married    Spouse name: Not on file   Number of children: Not on file   Years of education: Not on file   Highest education level: Not on file  Occupational History   Occupation: GREENHOUSE MANAGEMENT  Tobacco Use   Smoking status: Never   Smokeless tobacco: Never  Vaping Use   Vaping Use: Never used  Substance and Sexual Activity    Alcohol use: No   Drug use: No   Sexual activity: Not on file  Other Topics Concern   Not on file  Social History Narrative   Lives in [place] with his spouse   He works as a Research scientist (life sciences) Strain: Not on file  Food Insecurity: No [place] (02/11/2022)   Hunger Vital Sign    Worried About Running Out of Food in the Last Year: Never true    [place] in the Last Year: Never true  Transportation Needs: No Transportation Needs (02/11/2022)   PRAPARE - Hydrologist (Medical): No    Lack of Transportation (Non-Medical): No  Physical Activity: Not on file  Stress: Not on file  Social Connections: Not on file     Family History: The patient's family history includes Hypertension in his mother.  ROS:   Please see the  history of present illness.    All other systems reviewed and are negative.  EKGs/Labs/Other Studies Reviewed Today:      EKG:  Last EKG results: today - sinus rhythm   Recent Labs: 02/11/2022: TSH 0.232 02/12/2022: ALT 10; B Natriuretic Peptide 151.5; BUN 25; Creatinine, Ser 1.68; Hemoglobin 9.5; Magnesium 2.1; Platelets 287; Potassium 3.8; Sodium 140     Physical Exam:    VS:  BP (!) 150/76   Pulse 60   Ht 6' (1.829 m)   Wt 249 lb (112.9 kg)   SpO2 95%   BMI 33.77 kg/m     Wt Readings from Last 3 Encounters:  02/16/22 249 lb (112.9 kg)  02/07/22 253 lb (114.8 kg)  02/06/22 253 lb (114.8 kg)     GEN:  Well nourished, well developed in no acute distress CARDIAC: RRR, no murmurs, rubs, gallops RESPIRATORY:  Normal work of breathing MUSCULOSKELETAL: no edema    ASSESSMENT & PLAN:    Atrial fibrillation: persistent. Failed amiodarone. I think it appropriate to repeat EP study and assess the prior PVI. We discussed the indication, rationale, logistics, anticipated benefits, and potential risks of the ablation procedure including but not limited to -- bleed at  the groin access site, chest pain, damage to nearby organs such as the diaphragm, lungs, or esophagus, need for a drainage tube, or prolonged hospitalization. I explained that the risk for stroke, heart attack, need for open chest surgery, or even death is very low but not zero. he  expressed understanding and wishes to proceed. 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  Concerns regarding medicines are outlined above.  No orders of the defined types were placed in this encounter.  No orders of the defined types were placed in this encounter.    Signed, Juan Quitter, MD  02/16/2022 4:45 PM    [place]

## 2022-02-16 NOTE — Patient Instructions (Addendum)
Medication Instructions:  Your physician recommends that you continue on your current medications as directed. Please refer to the Current Medication list given to you today.   *If you need a refill on your cardiac medications before your next appointment, please call your pharmacy*   Lab Work: On Monday May 10, 2022: CBC, BMET (you do not need to be fasting) You may come to the lab any time between 7:30am and 4:30pm.  If you have labs (blood work) drawn today and your tests are completely normal, you will receive your results only by: [place] (if you have MyChart) OR A paper copy in the mail If you have any lab test that is abnormal or we need to change your treatment, we will call you to review the results.   Testing/Procedures: Your physician has requested that you have cardiac CT. Cardiac computed tomography (CT) is a painless test that uses an x-ray machine to take clear, detailed pictures of your heart. For further information please visit [email or web address][facility]tn. Please follow instruction sheet as given. This will be done 1 week prior to your ablation procedure.   Your physician has recommended that you have an ablation. Catheter ablation is a medical procedure used to treat some cardiac arrhythmias (irregular heartbeats). During catheter ablation, a long, thin, flexible tube is put into a blood vessel in your groin (upper thigh), or neck. This tube is called an ablation catheter. It is then guided to your heart through the blood vessel. Radio frequency waves destroy small areas of heart tissue where abnormal heartbeats may cause an arrhythmia to start.    Your ablation procedure is scheduled for Monday May 24, 2022 with Dr.  Doralee Albino at 01:30 pm. Dennis Bast will arrive at [facility]. [facility] (main entrance A) at 11:30 am. Please call our office at [phone] and ask to speak with April G. if you need to pick another date.   Follow-Up: At [facility], you and your  health needs are our priority.  As part of our continuing mission to provide you with exceptional heart care, we have created designated Provider Care Teams.  These Care Teams include your primary Cardiologist (physician) and Advanced Practice Providers (APPs -  Physician Assistants and Nurse Practitioners) who all work together to provide you with the care you need, when you need it.   Your next appointment:   4 week(s) after your ablation   Provider:   You will follow up in the [facility] located at [facility]. Your provider will be: Roderic Palau, NP or Clint R. Fenton, PA-C

## 2022-02-16 NOTE — Addendum Note (Signed)
Addended by: Molli Barrows on: 02/16/2022 05:04 PM   Modules accepted: Orders

## 2022-02-18 ENCOUNTER — Other Ambulatory Visit: Payer: Self-pay

## 2022-02-18 ENCOUNTER — Telehealth: Payer: Self-pay

## 2022-02-18 ENCOUNTER — Other Ambulatory Visit: Payer: Medicare HMO

## 2022-02-18 DIAGNOSIS — I4819 Other persistent atrial fibrillation: Secondary | ICD-10-CM

## 2022-02-18 NOTE — Telephone Encounter (Signed)
Work up complete for Afib Ablation  Instruction letters mailed to pt on 02/18/22.

## 2022-02-19 DIAGNOSIS — I700 Atherosclerosis of aorta: Secondary | ICD-10-CM | POA: Diagnosis not present

## 2022-02-19 DIAGNOSIS — I5020 Unspecified systolic (congestive) heart failure: Secondary | ICD-10-CM | POA: Diagnosis not present

## 2022-02-19 DIAGNOSIS — N179 Acute kidney failure, unspecified: Secondary | ICD-10-CM | POA: Diagnosis not present

## 2022-02-19 DIAGNOSIS — K436 Other and unspecified ventral hernia with obstruction, without gangrene: Secondary | ICD-10-CM | POA: Diagnosis not present

## 2022-02-19 DIAGNOSIS — I5022 Chronic systolic (congestive) heart failure: Secondary | ICD-10-CM | POA: Diagnosis not present

## 2022-02-19 DIAGNOSIS — N1832 Chronic kidney disease, stage 3b: Secondary | ICD-10-CM | POA: Diagnosis not present

## 2022-02-19 DIAGNOSIS — I480 Paroxysmal atrial fibrillation: Secondary | ICD-10-CM | POA: Diagnosis not present

## 2022-02-19 DIAGNOSIS — I482 Chronic atrial fibrillation, unspecified: Secondary | ICD-10-CM | POA: Diagnosis not present

## 2022-03-04 ENCOUNTER — Encounter (HOSPITAL_COMMUNITY): Payer: Self-pay | Admitting: *Deleted

## 2022-03-10 ENCOUNTER — Telehealth: Payer: Self-pay | Admitting: Cardiovascular Disease

## 2022-03-10 ENCOUNTER — Encounter: Payer: Self-pay | Admitting: Cardiovascular Disease

## 2022-03-10 NOTE — Telephone Encounter (Signed)
error 

## 2022-03-10 NOTE — Telephone Encounter (Signed)
Patient calling to speak with April. He has an ablation 4/29 and he would like to reschedule it to a day in June.

## 2022-03-17 NOTE — Telephone Encounter (Signed)
Pt has been rescheduled to 6/5 @ 1130.

## 2022-03-29 DIAGNOSIS — D649 Anemia, unspecified: Secondary | ICD-10-CM | POA: Diagnosis not present

## 2022-03-29 DIAGNOSIS — I5022 Chronic systolic (congestive) heart failure: Secondary | ICD-10-CM | POA: Diagnosis not present

## 2022-03-29 DIAGNOSIS — I480 Paroxysmal atrial fibrillation: Secondary | ICD-10-CM | POA: Diagnosis not present

## 2022-03-29 DIAGNOSIS — D6869 Other thrombophilia: Secondary | ICD-10-CM | POA: Diagnosis not present

## 2022-03-29 DIAGNOSIS — N1832 Chronic kidney disease, stage 3b: Secondary | ICD-10-CM | POA: Diagnosis not present

## 2022-05-08 ENCOUNTER — Other Ambulatory Visit (HOSPITAL_COMMUNITY): Payer: Self-pay | Admitting: Internal Medicine

## 2022-05-10 ENCOUNTER — Other Ambulatory Visit (HOSPITAL_COMMUNITY): Payer: Self-pay

## 2022-05-10 MED ORDER — FUROSEMIDE 40 MG PO TABS: ORAL_TABLET | ORAL | 2 refills | Status: DC

## 2022-05-10 MED ORDER — APIXABAN 5 MG PO TABS: 5.0000 mg | ORAL_TABLET | Freq: Two times a day (BID) | ORAL | 2 refills | Status: DC

## 2022-05-10 MED ORDER — ENTRESTO 97-103 MG PO TABS: 1.0000 | ORAL_TABLET | Freq: Two times a day (BID) | ORAL | 2 refills | Status: DC

## 2022-05-17 ENCOUNTER — Other Ambulatory Visit (HOSPITAL_COMMUNITY): Payer: Medicare HMO

## 2022-05-17 MED ORDER — FUROSEMIDE 40 MG PO TABS: ORAL_TABLET | ORAL | 2 refills | Status: DC

## 2022-05-17 NOTE — Addendum Note (Signed)
Addended by: Theresia Bough on: 05/17/2022 09:38 AM   Modules accepted: Orders

## 2022-06-23 ENCOUNTER — Telehealth (HOSPITAL_COMMUNITY): Payer: Self-pay | Admitting: *Deleted

## 2022-06-23 ENCOUNTER — Encounter (HOSPITAL_COMMUNITY): Payer: Self-pay

## 2022-06-23 ENCOUNTER — Ambulatory Visit (HOSPITAL_COMMUNITY)
Admission: RE | Admit: 2022-06-23 | Discharge: 2022-06-23 | Disposition: A | Discharge status: Home / Self Care | Payer: Medicare HMO | Source: Ambulatory Visit | Attending: Family Medicine | Admitting: Family Medicine

## 2022-06-23 VITALS — BP 124/88 | HR 57 | Wt 245.8 lb

## 2022-06-23 DIAGNOSIS — I10 Essential (primary) hypertension: Secondary | ICD-10-CM

## 2022-06-23 DIAGNOSIS — I4819 Other persistent atrial fibrillation: Secondary | ICD-10-CM | POA: Insufficient documentation

## 2022-06-23 DIAGNOSIS — I480 Paroxysmal atrial fibrillation: Secondary | ICD-10-CM

## 2022-06-23 DIAGNOSIS — R21 Rash and other nonspecific skin eruption: Secondary | ICD-10-CM | POA: Diagnosis not present

## 2022-06-23 DIAGNOSIS — I5022 Chronic systolic (congestive) heart failure: Secondary | ICD-10-CM | POA: Diagnosis not present

## 2022-06-23 DIAGNOSIS — I130 Hypertensive heart and chronic kidney disease with heart failure and stage 1 through stage 4 chronic kidney disease, or unspecified chronic kidney disease: Secondary | ICD-10-CM | POA: Diagnosis not present

## 2022-06-23 DIAGNOSIS — G4733 Obstructive sleep apnea (adult) (pediatric): Secondary | ICD-10-CM | POA: Diagnosis not present

## 2022-06-23 DIAGNOSIS — I5042 Chronic combined systolic (congestive) and diastolic (congestive) heart failure: Secondary | ICD-10-CM | POA: Diagnosis not present

## 2022-06-23 DIAGNOSIS — I513 Intracardiac thrombosis, not elsewhere classified: Secondary | ICD-10-CM

## 2022-06-23 DIAGNOSIS — N1832 Chronic kidney disease, stage 3b: Secondary | ICD-10-CM | POA: Diagnosis not present

## 2022-06-23 DIAGNOSIS — Z7901 Long term (current) use of anticoagulants: Secondary | ICD-10-CM | POA: Diagnosis not present

## 2022-06-23 DIAGNOSIS — Z79899 Other long term (current) drug therapy: Secondary | ICD-10-CM | POA: Diagnosis not present

## 2022-06-23 DIAGNOSIS — I428 Other cardiomyopathies: Secondary | ICD-10-CM | POA: Diagnosis not present

## 2022-06-23 LAB — BRAIN NATRIURETIC PEPTIDE: B Natriuretic Peptide: 72.3 pg/mL (ref 0.0–100.0)

## 2022-06-23 LAB — CBC
HCT: 33.9 % — ABNORMAL LOW (ref 39.0–52.0)
Hemoglobin: 10.7 g/dL — ABNORMAL LOW (ref 13.0–17.0)
MCH: 23.5 pg — ABNORMAL LOW (ref 26.0–34.0)
MCHC: 31.6 g/dL (ref 30.0–36.0)
MCV: 74.3 fL — ABNORMAL LOW (ref 80.0–100.0)
Platelets: 321 10*3/uL (ref 150–400)
RBC: 4.56 MIL/uL (ref 4.22–5.81)
RDW: 17.2 % — ABNORMAL HIGH (ref 11.5–15.5)
WBC: 6.3 10*3/uL (ref 4.0–10.5)
nRBC: 0 % (ref 0.0–0.2)

## 2022-06-23 LAB — BASIC METABOLIC PANEL
Anion gap: 8 (ref 5–15)
BUN: 20 mg/dL (ref 8–23)
CO2: 26 mmol/L (ref 22–32)
Calcium: 8.9 mg/dL (ref 8.9–10.3)
Chloride: 105 mmol/L (ref 98–111)
Creatinine, Ser: 1.68 mg/dL — ABNORMAL HIGH (ref 0.61–1.24)
GFR, Estimated: 44 mL/min — ABNORMAL LOW (ref >60)
Glucose, Bld: 89 mg/dL (ref 70–99)
Potassium: 4.2 mmol/L (ref 3.5–5.1)
Sodium: 139 mmol/L (ref 135–145)

## 2022-06-23 MED ORDER — FUROSEMIDE 40 MG PO TABS: 20.0000 mg | ORAL_TABLET | ORAL | 2 refills | Status: DC

## 2022-06-23 MED ORDER — EPLERENONE 25 MG PO TABS: 25.0000 mg | ORAL_TABLET | Freq: Every day | ORAL | 3 refills | Status: DC

## 2022-06-23 NOTE — Telephone Encounter (Signed)
Reaching out to patient to offer assistance regarding upcoming cardiac imaging study; pt verbalizes understanding of appt date/time, parking situation and where to check in, pre-test NPO status and verified current allergies; name and call back number provided for further questions should they arise ? ?Breanah Faddis RN Navigator Cardiac Imaging ?[facility] ?[phone] office ?[phone] cell ? ?Patient aware to arrive at 1pm. ?

## 2022-06-23 NOTE — Patient Instructions (Signed)
INCREASE Inspara to 25 mg one tab daily CHANGE Lasix to twice a week on Mondays and Fridays  Labs today We will only contact you if something comes back abnormal or we need to make some changes. Otherwise no news is good news!  Labs needed in one week  Please wear your compression hose daily, place them on as soon as you get up in the morning and remove before you go to bed at night.  Your physician recommends that you schedule a follow-up appointment in: 3 months with Dr Gala Romney  You have been referred to [facility] with Dr Mayford Knife -they will be in touch with a sleep apnea management follow up  Do the following things EVERYDAY: Weigh yourself in the morning before breakfast. Write it down and keep it in a log. Take your medicines as prescribed Eat low salt foods--Limit salt (sodium) to 2000 mg per day.  Stay as active as you can everyday Limit all fluids for the day to less than 2 liters At the [facility] Clinic, you and your health needs are our priority. As part of our continuing mission to provide you with exceptional heart care, we have created designated Provider Care Teams. These Care Teams include your primary Cardiologist (physician) and Advanced Practice Providers (APPs- Physician Assistants and Nurse Practitioners) who all work together to provide you with the care you need, when you need it.   You may see any of the following providers on your designated Care Team at your next follow up: Dr Arvilla Meres Dr Marca Ancona Dr. Marcos Eke, NP Robbie Lis, [place] [facility] [place], [place] Brynda Peon, NP Karle Plumber, PharmD   Please be sure to bring in all your medications bottles to every appointment.    Thank you for choosing [facility]  If you have any questions or concerns before your next appointment please send [place] a message through [facility] or call our office at [phone].     TO LEAVE A MESSAGE FOR THE NURSE SELECT OPTION 2, PLEASE LEAVE A MESSAGE INCLUDING: YOUR NAME DATE OF BIRTH CALL BACK NUMBER REASON FOR CALL**this is important as we prioritize the call backs  YOU WILL RECEIVE A CALL BACK THE SAME DAY AS LONG AS YOU CALL BEFORE 4:00 PM

## 2022-06-23 NOTE — Progress Notes (Addendum)
Advanced Heart Failure Clinic Note    Primary Cardiologist: Dr. Eden Emms HF: Dr Gala Romney  PCP: Dr Valentina Lucks Urology:  Dr Retta Diones   HPI: [name] is a 67 year old male with a past medical history of tachy mediated cardiomyopathy, persistent atrial fibrillation (on Eliquis), systolic CHF (EF 16-10%) and CKD 3b.   He was first diagnosed with Afib in 02/2016, seen in the Afib clinic and the ED for this. He underwent 2 DC-CV and only held NSR for a few days. He started Amiodarone.   Admitted 04/16/16-04/27/16 with rapid afib and decompensation. He was started on milrinone for marginal mixed venous sat of 57% and optimization with plans for repeat DCCV after loading with IV Amiodarone. Underwent TEE/DCCV on 04/23/16 with restoration of NSR.He was continued on Eliquis for anticoagulation at discharge, as he did have some evidence of LV thrombus on Echo, however it appeared to have resolved when he had a TEE on 04/23/16. Overall diuresed 9.5L and discharge weight was 217 pounds.   July 2019 he had kidney stones and underwent lithotripsy.   Echo 7/20 EF 50-55%.   In 7/20 was doing very well. EF 50-55%  Maintaining NSR on amio 200 daily. Amio cut back to 100 daily due to sinus brady with HR in 40s. Several weeks later presented for a sick visit with recurrent AF with RVR, We reloaded amio with 400 bid and scheduled DC-CV. Several days later presented for DC-CV and was in NSR.   Underwent PVI with Dr. Johney Frame 2/21.   Echo 8/21 EF 60-65% Echo 8/23: EF 45-50%, LV with GHK, GIDD, normal RV  Presented to ED 1/24 with abdominal pain 2/2 strangulated abdominal wall hernia. Had emergent laparotomy and repair of hernia and SBO.  Discharge weight 250 lbs.   Today he returns for AHF follow up. Overall feeling good. Denies palpitations, CP, dizziness, edema, or PND/Orthopnea. No SOB. Appetite good. No fever or chills. Does not weight at home. Taking all medications. Denies ETOH, smoking . Does landscape work.  Scheduled for a fib ablation 06/30/22. SBP 130s at home  Zio in 8/21 Sinus avg HR 56 with nighttime brady in 30s. 5% PVCs. No AF  Cardiac studeies reviewed: Echo 8/23: EF 45-50%, LV with GHK, GIDD, normal RV Echo 8/21 EF 60-65% Echo 3/18 EF 40-45% RV mildly HK  Echo 2/19 EF 40-45% RV mildly reduced ECHO 08/01/2018 EF 50-55%   Review of systems complete and found to be negative unless listed in HPI.   Past Medical History:  Diagnosis Date   Asthma, persistent    BPH (benign prostatic hyperplasia) 2014   CHF (congestive heart failure) (HCC)    Combined hyperlipidemia    Dysrhythmia    ATRIAL FIBRILLATION   GERD (gastroesophageal reflux disease)    History of kidney stones    Hypertension    Lymphocytic colitis 07/2011   MICROSCOPIC   Obesity    Persistent atrial fibrillation (HCC)    Renal stone 04/2012   Seasonal allergic rhinitis    Sleep apnea    does not use a cpap machine   Current Outpatient Medications  Medication Sig Dispense Refill   acetaminophen (TYLENOL) 325 MG tablet Take 2 tablets (650 mg total) by mouth every 6 (six) hours as needed for mild pain or headache.     albuterol (PROVENTIL HFA;VENTOLIN HFA) 108 (90 Base) MCG/ACT inhaler Inhale 2 puffs into the lungs every 6 (six) hours as needed for wheezing or shortness of breath.     amiodarone (PACERONE)  100 MG tablet Take 1 tablet (100 mg total) by mouth daily. May take an extra tablet daily for breakthrough afib as directed 120 tablet 2   amLODipine (NORVASC) 5 MG tablet TAKE 1 TABLET (5 MG TOTAL) BY MOUTH DAILY. 90 tablet 1   apixaban (ELIQUIS) 5 MG TABS tablet Take 1 tablet (5 mg total) by mouth 2 (two) times daily. 60 tablet 2   eplerenone (INSPRA) 25 MG tablet TAKE 1/2 TABLET BY MOUTH EVERY DAY 45 tablet 2   furosemide (LASIX) 40 MG tablet Take one tablet by mouth on Mondays and Fridays 30 tablet 2   pantoprazole (PROTONIX) 40 MG tablet Take 40 mg by mouth daily.     sacubitril-valsartan (ENTRESTO) 97-103 MG  Take 1 tablet by mouth 2 (two) times daily. NEEDS FOLLOW UP APPOINTMENT FOR MORE REFILLS 60 tablet 2   tadalafil (CIALIS) 5 MG tablet Take 5 mg by mouth daily.     fluticasone-salmeterol (ADVAIR DISKUS) 250-50 MCG/ACT AEPB Inhale 1 puff into the lungs in the morning and at bedtime. (Patient not taking: Reported on 06/23/2022)     No current facility-administered medications for this encounter.   Allergies  Allergen Reactions   Ibuprofen Other (See Comments)    Has a-Fib, unable to take   Isosorbide Other (See Comments)    HEADACHES from Imdur   Lisinopril Cough   Digoxin And Related Rash   Spironolactone Rash   Social History   Socioeconomic History   Marital status: Married    Spouse name: Not on file   Number of children: Not on file   Years of education: Not on file   Highest education level: Not on file  Occupational History   Occupation: GREENHOUSE MANAGEMENT  Tobacco Use   Smoking status: Never   Smokeless tobacco: Never  Vaping Use   Vaping Use: Never used  Substance and Sexual Activity   Alcohol use: No   Drug use: No   Sexual activity: Not on file  Other Topics Concern   Not on file  Social History Narrative   Lives in [place] with his spouse   He works as a Museum/gallery curator Strain: Not on file  Food Insecurity: No Food Insecurity (02/11/2022)   Hunger Vital Sign    Worried About Running Out of Food in the Last Year: Never true    Ran Out of Food in the Last Year: Never true  Transportation Needs: No Transportation Needs (02/11/2022)   PRAPARE - Administrator, Civil Service (Medical): No    Lack of Transportation (Non-Medical): No  Physical Activity: Not on file  Stress: Not on file  Social Connections: Not on file  Intimate Partner Violence: Not At Risk (02/11/2022)   Humiliation, Afraid, Rape, and Kick questionnaire    Fear of Current or Ex-Partner: No    Emotionally Abused: No     Physically Abused: No    Sexually Abused: No   Family History  Problem Relation Age of Onset   Hypertension Mother     Vitals:   06/23/22 1509  BP: 124/88  Pulse: (!) 57  SpO2: 95%  Weight: 111.5 kg (245 lb 12.8 oz)    Wt Readings from Last 3 Encounters:  06/23/22 111.5 kg (245 lb 12.8 oz)  02/16/22 112.9 kg (249 lb)  02/07/22 114.8 kg (253 lb)   PHYSICAL EXAM: General:  well appearing.  No respiratory difficulty. Walked into clinic HEENT: normal Neck:  supple. JVD ~9 cm. Carotids 2+ bilat; no bruits. No lymphadenopathy or thyromegaly appreciated. Cor: PMI nondisplaced. Brady rate & reg rhythm. No rubs, gallops or murmurs. Lungs: clear Abdomen: soft, nontender, nondistended. No hepatosplenomegaly. No bruits or masses. Good bowel sounds. Extremities: no cyanosis, clubbing, rash, +` BLE edema  Neuro: alert & oriented x 3, cranial nerves grossly intact. moves all 4 extremities w/o difficulty. Affect pleasant.   EKG: SB 54 bpm RBBB (Personally reviewed)    ReDS 30%  ASSESSMENT & PLAN: 1. Chronic systolic CHF: EF 40-98%.  NICM, felt to be tachy mediated.  - Echo 06/2016 LVEF 40%. - Echo 02/2017: EF 40-45% - Echo 7/20 EF 50-55%.   - Echo 8/21 EF 60-65% - Echo 8/23: EF 45-50%, LV with GHK, GIDD, normal RV - Doing great. NYHA I  - Volume status stable, ReDS 30%. Takes lasix 40 mg on Mondays and Fridays, decrease to 20 mg on  M/W.  - Continue Entresto to 97/103 BID. - Failed spiro d/t rash and HA. Increase eplerenone 12.5>25 mg daily . - Will reach out to Dr. Mayford Knife about adding SGLT2i - No BB with bradycardia. - order compression socks - BMET/BNP today  2. Paroxsymal atrial fibrillation and frequent PVCs - s/p PVI 2/21. Amio stopped - Zio in 8/21  Sinus avg HR 56 with nighttime brady in 30s. 5% PVCs. No AF -> amio restarted by Dr. Johney Frame - Continue Eliquis. No bleeding issues. Check CBC - Remains on amio 100 daily. Follows with Dr. Nelly Laurence - scheduled for a fib ablation  06/30/22 with cardiac CT tomorrow   3. Hypertension - BP stable  4. History of LV thrombus - Resolved. No change.   5. Chronic kidney disease stage IIIb:  - baseline creatinine 1.5-2.0.  - stable at 1.6 in 1/23 - follows with Dr. Malen Gauze, last seen a year ago. Will schedule a follow up.   6. OSA - Sleep study 4/18 with mild OSA. AHI 13.8/hr.  - Has never gotten CPAP - > will need to revisit. Refer to Dr. Mayford Knife.   Addendum 07/06/22 4:03 PM  Discussed with Dr. Malen Gauze about starting SGLT2i on Mr. Kinker, she agrees with starting. Ok to start at follow up.   Follow up in 3 months with Dr. Glenice Laine, NP  3:24 PM 06/23/22

## 2022-06-23 NOTE — Progress Notes (Signed)
ReDS Vest / Clip - 06/23/22 1500       ReDS Vest / Clip   Station Marker D    Ruler Value 32    ReDS Value Range Low volume    ReDS Actual Value 30

## 2022-06-24 ENCOUNTER — Ambulatory Visit (HOSPITAL_BASED_OUTPATIENT_CLINIC_OR_DEPARTMENT_OTHER)
Admission: RE | Admit: 2022-06-24 | Discharge: 2022-06-24 | Disposition: A | Discharge status: Home / Self Care | Payer: Medicare HMO | Source: Ambulatory Visit | Attending: Cardiovascular Disease | Admitting: Cardiovascular Disease

## 2022-06-24 DIAGNOSIS — I5022 Chronic systolic (congestive) heart failure: Secondary | ICD-10-CM | POA: Diagnosis not present

## 2022-06-24 DIAGNOSIS — Z7901 Long term (current) use of anticoagulants: Secondary | ICD-10-CM | POA: Diagnosis not present

## 2022-06-24 DIAGNOSIS — R21 Rash and other nonspecific skin eruption: Secondary | ICD-10-CM | POA: Diagnosis not present

## 2022-06-24 DIAGNOSIS — I130 Hypertensive heart and chronic kidney disease with heart failure and stage 1 through stage 4 chronic kidney disease, or unspecified chronic kidney disease: Secondary | ICD-10-CM | POA: Diagnosis not present

## 2022-06-24 DIAGNOSIS — Z79899 Other long term (current) drug therapy: Secondary | ICD-10-CM | POA: Diagnosis not present

## 2022-06-24 DIAGNOSIS — I4819 Other persistent atrial fibrillation: Secondary | ICD-10-CM

## 2022-06-24 DIAGNOSIS — I428 Other cardiomyopathies: Secondary | ICD-10-CM | POA: Diagnosis not present

## 2022-06-24 DIAGNOSIS — G4733 Obstructive sleep apnea (adult) (pediatric): Secondary | ICD-10-CM | POA: Diagnosis not present

## 2022-06-24 DIAGNOSIS — N1832 Chronic kidney disease, stage 3b: Secondary | ICD-10-CM | POA: Diagnosis not present

## 2022-06-24 MED ORDER — IOHEXOL 350 MG/ML SOLN
95.0000 mL | Freq: Once | INTRAVENOUS | Status: AC | PRN
  Administered 2022-06-24: 95 mL via INTRAVENOUS

## 2022-06-29 NOTE — Pre-Procedure Instructions (Signed)
Instructed patient on the following items: Arrival time 0930 Nothing to eat or drink after midnight No meds AM of procedure Responsible person to drive you home and stay with you for 24 hrs  Have you missed any doses of anti-coagulant Eliquis- Takes twice a day, hasn't missed any doses.  Don't take in the morning.

## 2022-06-30 ENCOUNTER — Other Ambulatory Visit: Payer: Self-pay

## 2022-06-30 ENCOUNTER — Ambulatory Visit (HOSPITAL_COMMUNITY)
Admission: RE | Admit: 2022-06-30 | Discharge: 2022-06-30 | Disposition: A | Discharge status: Home / Self Care | Payer: Medicare HMO | Attending: Cardiovascular Disease | Admitting: Cardiovascular Disease

## 2022-06-30 ENCOUNTER — Ambulatory Visit (HOSPITAL_BASED_OUTPATIENT_CLINIC_OR_DEPARTMENT_OTHER): Payer: Medicare HMO | Admitting: Certified Registered Nurse Anesthetist

## 2022-06-30 ENCOUNTER — Ambulatory Visit (HOSPITAL_COMMUNITY): Payer: Medicare HMO | Admitting: Certified Registered Nurse Anesthetist

## 2022-06-30 ENCOUNTER — Encounter (HOSPITAL_COMMUNITY)
Admission: RE | Disposition: A | Discharge status: Home / Self Care | Payer: Self-pay | Source: Home / Self Care | Attending: Cardiovascular Disease

## 2022-06-30 ENCOUNTER — Other Ambulatory Visit (HOSPITAL_COMMUNITY): Payer: Self-pay

## 2022-06-30 DIAGNOSIS — D6869 Other thrombophilia: Secondary | ICD-10-CM | POA: Insufficient documentation

## 2022-06-30 DIAGNOSIS — G4733 Obstructive sleep apnea (adult) (pediatric): Secondary | ICD-10-CM | POA: Insufficient documentation

## 2022-06-30 DIAGNOSIS — I110 Hypertensive heart disease with heart failure: Secondary | ICD-10-CM | POA: Insufficient documentation

## 2022-06-30 DIAGNOSIS — Z7901 Long term (current) use of anticoagulants: Secondary | ICD-10-CM | POA: Diagnosis not present

## 2022-06-30 DIAGNOSIS — J45909 Unspecified asthma, uncomplicated: Secondary | ICD-10-CM

## 2022-06-30 DIAGNOSIS — Z8249 Family history of ischemic heart disease and other diseases of the circulatory system: Secondary | ICD-10-CM | POA: Insufficient documentation

## 2022-06-30 DIAGNOSIS — G4730 Sleep apnea, unspecified: Secondary | ICD-10-CM | POA: Diagnosis not present

## 2022-06-30 DIAGNOSIS — I4891 Unspecified atrial fibrillation: Secondary | ICD-10-CM

## 2022-06-30 DIAGNOSIS — K4090 Unilateral inguinal hernia, without obstruction or gangrene, not specified as recurrent: Secondary | ICD-10-CM | POA: Insufficient documentation

## 2022-06-30 DIAGNOSIS — I4819 Other persistent atrial fibrillation: Secondary | ICD-10-CM | POA: Diagnosis not present

## 2022-06-30 DIAGNOSIS — I509 Heart failure, unspecified: Secondary | ICD-10-CM | POA: Insufficient documentation

## 2022-06-30 DIAGNOSIS — I428 Other cardiomyopathies: Secondary | ICD-10-CM | POA: Diagnosis not present

## 2022-06-30 HISTORY — PX: ATRIAL FIBRILLATION ABLATION: EP1191

## 2022-06-30 LAB — POCT ACTIVATED CLOTTING TIME
Activated Clotting Time: 275 seconds
Activated Clotting Time: 305 seconds

## 2022-06-30 SURGERY — ATRIAL FIBRILLATION ABLATION
Anesthesia: General

## 2022-06-30 MED ORDER — SUGAMMADEX SODIUM 200 MG/2ML IV SOLN
INTRAVENOUS | Status: DC | PRN
  Administered 2022-06-30: 200 mg via INTRAVENOUS

## 2022-06-30 MED ORDER — ACETAMINOPHEN 325 MG PO TABS: 650.0000 mg | ORAL_TABLET | ORAL | Status: DC | PRN

## 2022-06-30 MED ORDER — HEPARIN (PORCINE) IN NACL 1000-0.9 UT/500ML-% IV SOLN
INTRAVENOUS | Status: DC | PRN
  Administered 2022-06-30 (×4): 500 mL

## 2022-06-30 MED ORDER — ONDANSETRON HCL 4 MG/2ML IJ SOLN
INTRAMUSCULAR | Status: DC | PRN
  Administered 2022-06-30: 4 mg via INTRAVENOUS

## 2022-06-30 MED ORDER — EPHEDRINE SULFATE-NACL 50-0.9 MG/10ML-% IV SOSY
PREFILLED_SYRINGE | INTRAVENOUS | Status: DC | PRN
  Administered 2022-06-30: 10 mg via INTRAVENOUS
  Administered 2022-06-30: 5 mg via INTRAVENOUS

## 2022-06-30 MED ORDER — ROCURONIUM BROMIDE 10 MG/ML (PF) SYRINGE
PREFILLED_SYRINGE | INTRAVENOUS | Status: DC | PRN
  Administered 2022-06-30: 60 mg via INTRAVENOUS

## 2022-06-30 MED ORDER — HEPARIN SODIUM (PORCINE) 1000 UNIT/ML IJ SOLN
INTRAMUSCULAR | Status: DC | PRN
  Administered 2022-06-30: 3000 [IU] via INTRAVENOUS
  Administered 2022-06-30: 20000 [IU] via INTRAVENOUS

## 2022-06-30 MED ORDER — DOBUTAMINE INFUSION FOR EP/ECHO/NUC (1000 MCG/ML)
INTRAVENOUS | Status: DC | PRN
  Administered 2022-06-30: 20 ug/kg/min via INTRAVENOUS

## 2022-06-30 MED ORDER — DOBUTAMINE INFUSION FOR EP/ECHO/NUC (1000 MCG/ML)
INTRAVENOUS | Status: AC
  Filled 2022-06-30: qty 250

## 2022-06-30 MED ORDER — PHENYLEPHRINE HCL-NACL 20-0.9 MG/250ML-% IV SOLN
INTRAVENOUS | Status: DC | PRN
  Administered 2022-06-30: 20 ug/min via INTRAVENOUS

## 2022-06-30 MED ORDER — FENTANYL CITRATE (PF) 100 MCG/2ML IJ SOLN
INTRAMUSCULAR | Status: DC | PRN
  Administered 2022-06-30: 100 ug via INTRAVENOUS

## 2022-06-30 MED ORDER — PROPOFOL 10 MG/ML IV BOLUS
INTRAVENOUS | Status: DC | PRN
  Administered 2022-06-30: 40 mg via INTRAVENOUS
  Administered 2022-06-30: 150 mg via INTRAVENOUS

## 2022-06-30 MED ORDER — PROTAMINE SULFATE 10 MG/ML IV SOLN
INTRAVENOUS | Status: DC | PRN
  Administered 2022-06-30: 50 mg via INTRAVENOUS

## 2022-06-30 MED ORDER — ONDANSETRON HCL 4 MG/2ML IJ SOLN: 4.0000 mg | Freq: Four times a day (QID) | INTRAMUSCULAR | Status: DC | PRN

## 2022-06-30 MED ORDER — HEPARIN SODIUM (PORCINE) 1000 UNIT/ML IJ SOLN
INTRAMUSCULAR | Status: DC | PRN
  Administered 2022-06-30: 1000 [IU] via INTRAVENOUS

## 2022-06-30 MED ORDER — SODIUM CHLORIDE 0.9 % IV SOLN: INTRAVENOUS | Status: DC

## 2022-06-30 MED ORDER — LIDOCAINE 2% (20 MG/ML) 5 ML SYRINGE
INTRAMUSCULAR | Status: DC | PRN
  Administered 2022-06-30: 60 mg via INTRAVENOUS

## 2022-06-30 MED ORDER — COLCHICINE 0.6 MG PO TABS
0.6000 mg | ORAL_TABLET | Freq: Two times a day (BID) | ORAL | 0 refills | Status: DC
  Filled 2022-06-30: qty 10, 5d supply, fill #0

## 2022-06-30 SURGICAL SUPPLY — 19 items
BLANKET WARM UNDERBOD FULL ACC (MISCELLANEOUS) ×1 IMPLANT
CATH ABLAT QDOT MICRO BI TC FJ (CATHETERS) IMPLANT
CATH OCTARAY 2.0 F 3-3-3-3-3 (CATHETERS) IMPLANT
CATH PIGTAIL STEERABLE D1 8.7 (WIRE) IMPLANT
CATH S-M CIRCA TEMP PROBE (CATHETERS) IMPLANT
CATH SOUNDSTAR ECO 8FR (CATHETERS) IMPLANT
CATH WEBSTER BI DIR CS D-F CRV (CATHETERS) IMPLANT
CLOSURE PERCLOSE PROSTYLE (VASCULAR PRODUCTS) IMPLANT
COVER SWIFTLINK CONNECTOR (BAG) ×1 IMPLANT
DEVICE CLOSURE MYNXGRIP 6/7F (Vascular Products) IMPLANT
MAT PREVALON FULL STRYKER (MISCELLANEOUS) IMPLANT
PACK EP LATEX FREE (CUSTOM PROCEDURE TRAY) ×1
PACK EP LF (CUSTOM PROCEDURE TRAY) ×1 IMPLANT
PAD DEFIB RADIO PHYSIO CONN (PAD) ×1 IMPLANT
PATCH CARTO3 (PAD) IMPLANT
SHEATH CARTO VIZIGO MED CURVE (SHEATH) IMPLANT
SHEATH PINNACLE 8F 10CM (SHEATH) IMPLANT
SHEATH PINNACLE 9F 10CM (SHEATH) IMPLANT
SHEATH PROBE COVER 6X72 (BAG) IMPLANT

## 2022-06-30 NOTE — Discharge Instructions (Signed)

## 2022-06-30 NOTE — Progress Notes (Signed)
Pt ambulated to and from bathroom to void with no signs of oozing from bilateral groin sites  

## 2022-06-30 NOTE — Transfer of Care (Signed)
Immediate Anesthesia Transfer of Care Note  Patient: [name]  Procedure(s) Performed: ATRIAL FIBRILLATION ABLATION  Patient Location: PACU and Cath Lab  Anesthesia Type:General  Level of Consciousness: awake, alert , and oriented  Airway & Oxygen Therapy: Patient Spontanous Breathing and Patient connected to nasal cannula oxygen  Post-op Assessment: Report given to RN and Post -op Vital signs reviewed and stable  Post vital signs: Reviewed and stable  Last Vitals:  Vitals Value Taken Time  BP 102/72 06/30/22 1345  Temp 36.5 C 06/30/22 1347  Pulse 55 06/30/22 1348  Resp 13 06/30/22 1348  SpO2 96 % 06/30/22 1348  Vitals shown include unvalidated device data.  Last Pain:  Vitals:   06/30/22 1347  TempSrc: Temporal  PainSc: 0-No pain         Complications: No notable events documented.

## 2022-06-30 NOTE — Anesthesia Procedure Notes (Signed)
Procedure Name: Intubation Date/Time: 06/30/2022 11:31 AM  Performed by: Waynard Edwards, CRNAPre-anesthesia Checklist: Patient identified, Emergency Drugs available, Suction available and Patient being monitored Patient Re-evaluated:Patient Re-evaluated prior to induction Oxygen Delivery Method: Circle system utilized Preoxygenation: Pre-oxygenation with 100% oxygen Induction Type: IV induction Ventilation: Mask ventilation without difficulty Laryngoscope Size: Mac and 4 Grade View: Grade I Tube type: Oral Tube size: 7.5 mm Number of attempts: 1 Airway Equipment and Method: Stylet Placement Confirmation: ETT inserted through vocal cords under direct vision, positive ETCO2 and breath sounds checked- equal and bilateral Secured at: 23 cm Tube secured with: Tape Dental Injury: Teeth and Oropharynx as per pre-operative assessment

## 2022-06-30 NOTE — H&P (Signed)
Electrophysiology Office Note:    Date:  06/30/2022   ID:  ZYRAN EYER, DOB [birth year], MRN [id number]  PCP:  Kirby Funk, MD (Inactive)   [facility] Providers Cardiologist:  Charlton Haws, MD Electrophysiologist:  Hillis Range, MD (Inactive)  Advanced Heart Failure:  Arvilla Meres, MD  Sleep Medicine:  Armanda Magic, MD     Referring MD: No ref. provider found   History of Present Illness:    [name] is a 67 y.o. male with a hx listed below, significant for atrial fibrillation, CHF, referred for arrhythmia management.  He underwent A-fib ablation by Dr. Johney Frame in February 2021.  Per his recollection, he remained in sinus rhythm for at least a year and did reasonably well on amiodarone. He did have recurrence and had a DCCV in December, 2023.   He was discharged in the hospital just 4 days ago after treatment for strangulated hernia. He remained in sinus rhythm during the hospitalization on his dose of 100mg  daily of amiodarone.  Since cardioversion, he has not had palpitations, symptoms otherwise to suggest recurrence of AF.  I reviewed the patient's CT and labs. There was no LAA thrombus. he  has not missed any doses of anticoagulation, and he took his dose last night. There have been no changes in the patient's diagnoses, medications, or condition since our recent clinic visit.   Past Medical History:  Diagnosis Date   Asthma, persistent    BPH (benign prostatic hyperplasia) 2014   CHF (congestive heart failure) (HCC)    Combined hyperlipidemia    Dysrhythmia    ATRIAL FIBRILLATION   GERD (gastroesophageal reflux disease)    History of kidney stones    Hypertension    Lymphocytic colitis 07/2011   MICROSCOPIC   Obesity    Persistent atrial fibrillation (HCC)    Renal stone 04/2012   Seasonal allergic rhinitis    Sleep apnea    does not use a cpap machine    Past Surgical History:  Procedure Laterality Date   ATRIAL FIBRILLATION ABLATION N/A  03/16/2019   Procedure: ATRIAL FIBRILLATION ABLATION;  Surgeon: Hillis Range, MD;  Location: [facility] INVASIVE CV LAB;  Service: Cardiovascular;  Laterality: N/A;   BOWEL RESECTION N/A 02/07/2022   Procedure: SMALL BOWEL RESECTION;  Surgeon: Berna Bue, MD;  Location: [facility] OR;  Service: General;  Laterality: N/A;   CARDIOVERSION N/A 04/23/2016   Procedure: CARDIOVERSION;  Surgeon: Dolores Patty, MD;  Location: [facility] ENDOSCOPY;  Service: Cardiovascular;  Laterality: N/A;   CARDIOVERSION N/A 01/12/2022   Procedure: CARDIOVERSION;  Surgeon: Jodelle Red, MD;  Location: [facility] ENDOSCOPY;  Service: Cardiovascular;  Laterality: N/A;   CYSTOSCOPY WITH RETROGRADE PYELOGRAM, URETEROSCOPY AND STENT PLACEMENT Left 07/29/2017   Procedure: CYSTOSCOPY WITH RETROGRADE PYELOGRAM AND LEFT STENT PLACEMENT;  Surgeon: Marcine Matar, MD;  Location: [facility] ORS;  Service: Urology;  Laterality: Left;   EXTRACORPOREAL SHOCK WAVE LITHOTRIPSY Left 08/08/2017   Procedure: LEFT EXTRACORPOREAL SHOCK WAVE LITHOTRIPSY (ESWL);  Surgeon: Hildred Laser, MD;  Location: [facility] ORS;  Service: Urology;  Laterality: Left;   LAPAROTOMY N/A 02/07/2022   Procedure: EXPLORATORY LAPAROTOMY;  Surgeon: Berna Bue, MD;  Location: [facility] OR;  Service: General;  Laterality: N/A;   TEE WITHOUT CARDIOVERSION N/A 04/23/2016   Procedure: TRANSESOPHAGEAL ECHOCARDIOGRAM (TEE);  Surgeon: Dolores Patty, MD;  Location: [facility] ENDOSCOPY;  Service: Cardiovascular;  Laterality: N/A;   VENTRAL HERNIA REPAIR N/A 02/07/2022   Procedure: REPAIR INCARCERATED VENTRAL  HERNIA;  Surgeon: Fredricka Bonine,  Lady Deutscher, MD;  Location: [facility] OR;  Service: General;  Laterality: N/A;   XI ROBOTIC ASSISTED SIMPLE PROSTATECTOMY N/A 12/25/2018   Procedure: XI ROBOTIC ASSISTED SIMPLE PROSTATECTOMY, REMOVAL OF BLADDER STONES;  Surgeon: Malen Gauze, MD;  Location: [facility] ORS;  Service: Urology;  Laterality: N/A;    Current Medications: Current Meds  Medication Sig    acetaminophen (TYLENOL) 325 MG tablet Take 2 tablets (650 mg total) by mouth every 6 (six) hours as needed for mild pain or headache.   amiodarone (PACERONE) 100 MG tablet Take 1 tablet (100 mg total) by mouth daily. May take an extra tablet daily for breakthrough afib as directed   amLODipine (NORVASC) 5 MG tablet TAKE 1 TABLET (5 MG TOTAL) BY MOUTH DAILY.   apixaban (ELIQUIS) 5 MG TABS tablet Take 1 tablet (5 mg total) by mouth 2 (two) times daily.   eplerenone (INSPRA) 25 MG tablet Take 1 tablet (25 mg total) by mouth daily.   furosemide (LASIX) 40 MG tablet Take 0.5 tablets (20 mg total) by mouth 2 (two) times a week. Mondays and Fridays   oxymetazoline (AFRIN) 0.05 % nasal spray Place 1 spray into both nostrils 2 (two) times daily as needed for congestion.   pantoprazole (PROTONIX) 40 MG tablet Take 40 mg by mouth daily.   sacubitril-valsartan (ENTRESTO) 97-103 MG Take 1 tablet by mouth 2 (two) times daily. NEEDS FOLLOW UP APPOINTMENT FOR MORE REFILLS   tadalafil (CIALIS) 5 MG tablet Take 5 mg by mouth daily.     Allergies:   Ibuprofen, Isosorbide, Lisinopril, Digoxin and related, and Spironolactone   Social History   Socioeconomic History   Marital status: Married    Spouse name: Not on file   Number of children: Not on file   Years of education: Not on file   Highest education level: Not on file  Occupational History   Occupation: GREENHOUSE MANAGEMENT  Tobacco Use   Smoking status: Never   Smokeless tobacco: Never  Vaping Use   Vaping Use: Never used  Substance and Sexual Activity   Alcohol use: No   Drug use: No   Sexual activity: Not on file  Other Topics Concern   Not on file  Social History Narrative   Lives in [place] with his spouse   He works as a Museum/gallery curator Strain: Not on file  Food Insecurity: No Food Insecurity (02/11/2022)   Hunger Vital Sign    Worried About Running Out of Food in the Last Year:  Never true    Ran Out of Food in the Last Year: Never true  Transportation Needs: No Transportation Needs (02/11/2022)   PRAPARE - Administrator, Civil Service (Medical): No    Lack of Transportation (Non-Medical): No  Physical Activity: Not on file  Stress: Not on file  Social Connections: Not on file     Family History: The patient's family history includes Hypertension in his mother.  ROS:   Please see the history of present illness.    All other systems reviewed and are negative.  EKGs/Labs/Other Studies Reviewed Today:      EKG:  Last EKG results: sinus rhythm   Recent Labs: 02/11/2022: TSH 0.232 02/12/2022: ALT 10; Magnesium 2.1 06/23/2022: B Natriuretic Peptide 72.3; BUN 20; Creatinine, Ser 1.68; Hemoglobin 10.7; Platelets 321; Potassium 4.2; Sodium 139     Physical Exam:    VS:  BP (!) 135/94   Pulse Marland Kitchen)  49   Temp 98.2 F (36.8 C) (Temporal)   Resp 17   Ht 6' (1.829 m)   Wt 111.6 kg   SpO2 97%   BMI 33.36 kg/m     Wt Readings from Last 3 Encounters:  06/30/22 111.6 kg  06/23/22 111.5 kg  02/16/22 112.9 kg     GEN:  Well nourished, well developed in no acute distress CARDIAC: RRR, no murmurs, rubs, gallops RESPIRATORY:  Normal work of breathing MUSCULOSKELETAL: no edema    ASSESSMENT & PLAN:    Atrial fibrillation: persistent. Failed amiodarone. I think it appropriate to repeat EP study and assess the prior PVI. We discussed the indication, rationale, logistics, anticipated benefits, and potential risks of the ablation procedure including but not limited to -- bleed at the groin access site, chest pain, damage to nearby organs such as the diaphragm, lungs, or esophagus, need for a drainage tube, or prolonged hospitalization. I explained that the risk for stroke, heart attack, need for open chest surgery, or even death is very low but not zero. he  expressed understanding and wishes to proceed. He presents today for ablation. 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  Concerns regarding medicines are outlined above.  Orders Placed This Encounter  Procedures   Informed Consent Details: Physician/Practitioner Attestation; Transcribe to consent form and obtain patient signature   Initiate Pre-op Protocol   Void on call to EP Lab   Confirm CBC and BMP (or CMP) results within 7 days for inpatient and 30 days for outpatient:   Clip right and left femoral area PM before surgery   Clip right internal jugular area PM before surgery   Pre-admission testing diagnosis   EP STUDY   Insert peripheral IV   Meds ordered this encounter  Medications   0.9 %  sodium chloride infusion     Signed, Maurice Small, MD  06/30/2022 11:04 AM    [facility]

## 2022-06-30 NOTE — Anesthesia Preprocedure Evaluation (Addendum)
Anesthesia Evaluation  Patient identified by MRN, date of birth, ID band Patient awake    Reviewed: Allergy & Precautions, NPO status , Patient's Chart, lab work & pertinent test results  Airway Mallampati: II  TM Distance: >3 FB Neck ROM: Full    Dental no notable dental hx.    Pulmonary asthma , sleep apnea    Pulmonary exam normal        Cardiovascular hypertension, Pt. on medications +CHF  + dysrhythmias  Rhythm:Irregular Rate:Normal     Neuro/Psych negative neurological ROS  negative psych ROS   GI/Hepatic Neg liver ROS,GERD  Medicated,,  Endo/Other  negative endocrine ROS    Renal/GU   negative genitourinary   Musculoskeletal   Abdominal Normal abdominal exam  (+)   Peds  Hematology Lab Results      Component                Value               Date                      WBC                      6.3                 06/23/2022                HGB                      10.7 (L)            06/23/2022                HCT                      33.9 (L)            06/23/2022                MCV                      74.3 (L)            06/23/2022                PLT                      321                 06/23/2022             Lab Results      Component                Value               Date                      NA                       139                 06/23/2022                K                        4.2  06/23/2022                CO2                      26                  06/23/2022                GLUCOSE                  89                  06/23/2022                BUN                      20                  06/23/2022                CREATININE               1.68 (H)            06/23/2022                CALCIUM                  8.9                 06/23/2022                EGFR                     46 (L)              02/03/2021                GFRNONAA                 44 (L)               06/23/2022              Anesthesia Other Findings   Reproductive/Obstetrics                             Anesthesia Physical Anesthesia Plan  ASA: 3  Anesthesia Plan: General   Post-op Pain Management:    Induction: Intravenous  PONV Risk Score and Plan: 2 and Ondansetron, Dexamethasone, Midazolam and Treatment may vary due to age or medical condition  Airway Management Planned: Mask and Oral ETT  Additional Equipment: None  Intra-op Plan:   Post-operative Plan: Extubation in OR  Informed Consent: I have reviewed the patients History and Physical, chart, labs and discussed the procedure including the risks, benefits and alternatives for the proposed anesthesia with the patient or authorized representative who has indicated his/her understanding and acceptance.     Dental advisory given  Plan Discussed with: CRNA  Anesthesia Plan Comments: (ECHO 08/23:  1. Left ventricular ejection fraction, by estimation, is 45 to 50%. The  left ventricle has mildly decreased function. The left ventricle  demonstrates global hypokinesis. The left ventricular internal cavity size  was severely dilated. Left ventricular  diastolic parameters are consistent with Grade I diastolic dysfunction  (impaired relaxation).   2. Right ventricular systolic function is normal. The right ventricular  size is normal. There is  normal pulmonary artery systolic pressure.   3. Left atrial size was moderately dilated.   4. The mitral valve is normal in structure. Mild, eccentric, mitral valve  regurgitation. No evidence of mitral stenosis.   5. The aortic valve is tricuspid. Aortic valve regurgitation is not  visualized. No aortic stenosis is present.   6. The inferior vena cava is normal in size with greater than 50%  respiratory variability, suggesting right atrial pressure of 3 mmHg.   Comparison(s): Prior images reviewed side by side. LV size has increased  from 2020-> LVIDd  6.3 cm -> 6.9 cm.   )       Anesthesia Quick Evaluation

## 2022-07-01 ENCOUNTER — Encounter (HOSPITAL_COMMUNITY): Payer: Self-pay | Admitting: Cardiovascular Disease

## 2022-07-01 NOTE — Anesthesia Postprocedure Evaluation (Signed)
Anesthesia Post Note  Patient: [name]  Procedure(s) Performed: ATRIAL FIBRILLATION ABLATION     Patient location during evaluation: PACU Anesthesia Type: General Level of consciousness: awake and alert Pain management: pain level controlled Vital Signs Assessment: post-procedure vital signs reviewed and stable Respiratory status: spontaneous breathing, nonlabored ventilation, respiratory function stable and patient connected to nasal cannula oxygen Cardiovascular status: blood pressure returned to baseline and stable Postop Assessment: no apparent nausea or vomiting Anesthetic complications: no   No notable events documented.  Last Vitals:  Vitals:   06/30/22 1600 06/30/22 1630  BP: 120/77 106/76  Pulse:    Resp: 10 11  Temp:    SpO2:      Last Pain:  Vitals:   06/30/22 1423  TempSrc:   PainSc: 0-No pain                 Trevor Iha

## 2022-07-06 NOTE — Addendum Note (Signed)
Encounter addended by: Alen Bleacher, NP on: 07/06/2022 4:05 PM  Actions taken: Clinical Note Signed

## 2022-07-19 DIAGNOSIS — N1831 Chronic kidney disease, stage 3a: Secondary | ICD-10-CM | POA: Diagnosis not present

## 2022-07-20 DIAGNOSIS — N1831 Chronic kidney disease, stage 3a: Secondary | ICD-10-CM | POA: Diagnosis not present

## 2022-07-27 DIAGNOSIS — N1831 Chronic kidney disease, stage 3a: Secondary | ICD-10-CM | POA: Diagnosis not present

## 2022-07-27 DIAGNOSIS — Z87442 Personal history of urinary calculi: Secondary | ICD-10-CM | POA: Diagnosis not present

## 2022-07-27 DIAGNOSIS — I129 Hypertensive chronic kidney disease with stage 1 through stage 4 chronic kidney disease, or unspecified chronic kidney disease: Secondary | ICD-10-CM | POA: Diagnosis not present

## 2022-07-27 DIAGNOSIS — N281 Cyst of kidney, acquired: Secondary | ICD-10-CM | POA: Diagnosis not present

## 2022-07-27 DIAGNOSIS — R809 Proteinuria, unspecified: Secondary | ICD-10-CM | POA: Diagnosis not present

## 2022-07-27 DIAGNOSIS — I5022 Chronic systolic (congestive) heart failure: Secondary | ICD-10-CM | POA: Diagnosis not present

## 2022-07-28 ENCOUNTER — Ambulatory Visit (HOSPITAL_COMMUNITY)
Admission: RE | Admit: 2022-07-28 | Discharge: 2022-07-28 | Disposition: A | Discharge status: Home / Self Care | Payer: Medicare HMO | Source: Ambulatory Visit | Attending: Physician Assistant | Admitting: Physician Assistant

## 2022-07-28 VITALS — BP 136/70 | HR 64 | Ht 72.0 in | Wt 244.2 lb

## 2022-07-28 DIAGNOSIS — D6869 Other thrombophilia: Secondary | ICD-10-CM | POA: Diagnosis not present

## 2022-07-28 DIAGNOSIS — J45909 Unspecified asthma, uncomplicated: Secondary | ICD-10-CM | POA: Insufficient documentation

## 2022-07-28 DIAGNOSIS — I4819 Other persistent atrial fibrillation: Secondary | ICD-10-CM | POA: Diagnosis not present

## 2022-07-28 DIAGNOSIS — N400 Enlarged prostate without lower urinary tract symptoms: Secondary | ICD-10-CM | POA: Diagnosis not present

## 2022-07-28 DIAGNOSIS — E782 Mixed hyperlipidemia: Secondary | ICD-10-CM | POA: Insufficient documentation

## 2022-07-28 DIAGNOSIS — G4733 Obstructive sleep apnea (adult) (pediatric): Secondary | ICD-10-CM | POA: Diagnosis not present

## 2022-07-28 DIAGNOSIS — K219 Gastro-esophageal reflux disease without esophagitis: Secondary | ICD-10-CM | POA: Insufficient documentation

## 2022-07-28 DIAGNOSIS — R000 Tachycardia, unspecified: Secondary | ICD-10-CM | POA: Diagnosis not present

## 2022-07-28 DIAGNOSIS — Z87442 Personal history of urinary calculi: Secondary | ICD-10-CM | POA: Diagnosis not present

## 2022-07-28 DIAGNOSIS — I2510 Atherosclerotic heart disease of native coronary artery without angina pectoris: Secondary | ICD-10-CM | POA: Diagnosis not present

## 2022-07-28 DIAGNOSIS — Z7901 Long term (current) use of anticoagulants: Secondary | ICD-10-CM | POA: Insufficient documentation

## 2022-07-28 DIAGNOSIS — I130 Hypertensive heart and chronic kidney disease with heart failure and stage 1 through stage 4 chronic kidney disease, or unspecified chronic kidney disease: Secondary | ICD-10-CM | POA: Diagnosis not present

## 2022-07-28 NOTE — Progress Notes (Signed)
Primary Care Physician: Kirby Funk, MD (Inactive) Primary Cardiologist: Dr Eden Emms Primary Electrophysiologist: Dr Nelly Laurence  [facility]: Dr Gala Romney Referring Physician: Dr Johney Frame   [name] is a 67 y.o. male with a history of persistent atrial fibrillation, tachycardia mediated CM, CKD, HTN, CAD, OSA who presents for follow up in the [facility]. The patient was initially diagnosed with atrial fibrillation 2018 after presenting with symptoms of SOB and fatigue. 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  He initially failed cardioversion and was placed on amiodarone.  With rhythm control, his EF has improved from 20% to 50%.  He did well for 2 years with low dose amiodarone. Unfortunately, he continued to have episodes of afib. He underwent afib ablation on 03/16/19 with Dr Johney Frame. Patient is on Eliquis for a CHADS2VASC score of 4. He had recurrence of his afib and underwent repeat ablation with Dr Nelly Laurence on 06/30/22.  On follow up today, patient reports that he has done well since his ablation. He has not had any symptoms of afib or episodes noted on his smart watch. He continued amiodarone for 2 weeks post ablation and then discontinued. No chest pain, swallowing pain, or groin issues.   Today, he denies symptoms of palpitations, chest pain, PND, presyncope, syncope, bleeding, or neurologic sequela. The patient is tolerating medications without difficulties and is otherwise without complaint today.    Atrial Fibrillation Risk Factors:  he does have symptoms or diagnosis of sleep apnea. he is not compliant with CPAP therapy.   Atrial Fibrillation Management history:  Previous antiarrhythmic drugs: amiodarone Previous cardioversions: 03/2016 Previous ablations: 03/16/19, 06/30/22 Anticoagulation history: Eliquis   Past Medical History:  Diagnosis Date   Asthma, persistent    BPH (benign prostatic hyperplasia) 2014   CHF (congestive heart failure) (HCC)     Combined hyperlipidemia    Dysrhythmia    ATRIAL FIBRILLATION   GERD (gastroesophageal reflux disease)    History of kidney stones    Hypertension    Lymphocytic colitis 07/2011   MICROSCOPIC   Obesity    Persistent atrial fibrillation (HCC)    Renal stone 04/2012   Seasonal allergic rhinitis    Sleep apnea    does not use a cpap machine    ROS- All systems are reviewed and negative except as per the HPI above.  Physical Exam: Vitals:   07/28/22 1445  BP: 136/70  Pulse: 64  Weight: 110.8 kg  Height: 6' (1.829 m)     GEN: Well nourished, well developed in no acute distress NECK: No JVD; No carotid bruits CARDIAC: Regular rate and rhythm, no murmurs, rubs, gallops RESPIRATORY:  Clear to auscultation without rales, wheezing or rhonchi  ABDOMEN: Soft, non-tender, non-distended EXTREMITIES:  No edema; No deformity    Wt Readings from Last 3 Encounters:  07/28/22 110.8 kg  06/30/22 111.6 kg  06/23/22 111.5 kg    EKG today demonstrates SR, RBBB Vent. rate 63 BPM PR interval 166 ms QRS duration 152 ms QT/QTcB 544/556 ms   Echo 09/04/21 demonstrated   1. Left ventricular ejection fraction, by estimation, is 45 to 50%. The  left ventricle has mildly decreased function. The left ventricle  demonstrates global hypokinesis. The left ventricular internal cavity size  was severely dilated. Left ventricular  diastolic parameters are consistent with Grade I diastolic dysfunction  (impaired relaxation).   2. Right ventricular systolic function is normal. The right ventricular  size is normal. There is normal pulmonary artery systolic  pressure.   3. Left atrial size was moderately dilated.   4. The mitral valve is normal in structure. Mild, eccentric, mitral valve  regurgitation. No evidence of mitral stenosis.   5. The aortic valve is tricuspid. Aortic valve regurgitation is not  visualized. No aortic stenosis is present.   6. The inferior vena cava is normal in size  with greater than 50%  respiratory variability, suggesting right atrial pressure of 3 mmHg.   Comparison(s): Prior images reviewed side by side. LV size has increased  from 2020-> LVIDd 6.3 cm -> 6.9 cm.   Epic records are reviewed at length today  CHA2DS2-VASc Score = 4  The patient's score is based upon: CHF History: 1 HTN History: 1 Diabetes History: 0 Stroke History: 0 Vascular Disease History: 1 Age Score: 1 Gender Score: 0       ASSESSMENT AND PLAN: Persistent Atrial Fibrillation (ICD10:  I48.19) The patient's CHA2DS2-VASc score is 4, indicating a 4.8% annual risk of stroke.   S/p afib ablation with Dr Johney Frame 03/16/19 and 06/30/22 with Dr Nelly Laurence Patient appears to be maintaining SR. Now off amiodarone.  Continue Eliquis 5 mg BID with no missed doses for 3 months post ablation.  Secondary Hypercoagulable State (ICD10:  D68.69) The patient is at significant risk for stroke/thromboembolism based upon his CHA2DS2-VASc Score of 4.  Continue Apixaban (Eliquis).   OSA  Sleep study 05/12/21 with mild OSA Has consultation with Dr Mayford Knife on 09/23/22  HTN Stable on current regimen  Tachycardia mediated CM/HFmrEF Fluid status appears stable today  CAD CAC score 241 on CT No anginal symptoms   Follow up with Dr Nelly Laurence as scheduled.    [name] PA-C Afib Clinic [facility] [address] [place], [place] [address] [phone]

## 2022-07-30 ENCOUNTER — Other Ambulatory Visit (HOSPITAL_COMMUNITY): Payer: Self-pay | Admitting: Internal Medicine

## 2022-08-02 DIAGNOSIS — E782 Mixed hyperlipidemia: Secondary | ICD-10-CM | POA: Diagnosis not present

## 2022-08-02 DIAGNOSIS — I5020 Unspecified systolic (congestive) heart failure: Secondary | ICD-10-CM | POA: Diagnosis not present

## 2022-08-02 DIAGNOSIS — N1832 Chronic kidney disease, stage 3b: Secondary | ICD-10-CM | POA: Diagnosis not present

## 2022-08-02 DIAGNOSIS — J4530 Mild persistent asthma, uncomplicated: Secondary | ICD-10-CM | POA: Diagnosis not present

## 2022-08-02 DIAGNOSIS — I130 Hypertensive heart and chronic kidney disease with heart failure and stage 1 through stage 4 chronic kidney disease, or unspecified chronic kidney disease: Secondary | ICD-10-CM | POA: Diagnosis not present

## 2022-08-02 DIAGNOSIS — E261 Secondary hyperaldosteronism: Secondary | ICD-10-CM | POA: Diagnosis not present

## 2022-09-23 ENCOUNTER — Ambulatory Visit: Payer: Medicare HMO | Admitting: Cardiology

## 2022-09-25 ENCOUNTER — Other Ambulatory Visit (HOSPITAL_COMMUNITY): Payer: Self-pay | Admitting: Internal Medicine

## 2022-10-01 DIAGNOSIS — I480 Paroxysmal atrial fibrillation: Secondary | ICD-10-CM | POA: Diagnosis not present

## 2022-10-01 DIAGNOSIS — I130 Hypertensive heart and chronic kidney disease with heart failure and stage 1 through stage 4 chronic kidney disease, or unspecified chronic kidney disease: Secondary | ICD-10-CM | POA: Diagnosis not present

## 2022-10-01 DIAGNOSIS — Z23 Encounter for immunization: Secondary | ICD-10-CM | POA: Diagnosis not present

## 2022-10-01 DIAGNOSIS — N1832 Chronic kidney disease, stage 3b: Secondary | ICD-10-CM | POA: Diagnosis not present

## 2022-10-01 DIAGNOSIS — I5020 Unspecified systolic (congestive) heart failure: Secondary | ICD-10-CM | POA: Diagnosis not present

## 2022-10-01 DIAGNOSIS — Z9989 Dependence on other enabling machines and devices: Secondary | ICD-10-CM | POA: Diagnosis not present

## 2022-10-05 ENCOUNTER — Ambulatory Visit: Payer: Medicare HMO | Admitting: Cardiology

## 2022-10-07 ENCOUNTER — Encounter (HOSPITAL_COMMUNITY): Payer: Self-pay | Admitting: Internal Medicine

## 2022-10-07 ENCOUNTER — Ambulatory Visit (HOSPITAL_COMMUNITY)
Admission: RE | Admit: 2022-10-07 | Discharge: 2022-10-07 | Disposition: A | Discharge status: Home / Self Care | Payer: Medicare HMO | Source: Ambulatory Visit | Attending: Internal Medicine | Admitting: Internal Medicine

## 2022-10-07 ENCOUNTER — Other Ambulatory Visit (HOSPITAL_COMMUNITY): Payer: Self-pay

## 2022-10-07 ENCOUNTER — Telehealth (HOSPITAL_COMMUNITY): Payer: Self-pay

## 2022-10-07 VITALS — BP 150/80 | HR 64 | Wt 249.6 lb

## 2022-10-07 DIAGNOSIS — R9431 Abnormal electrocardiogram [ECG] [EKG]: Secondary | ICD-10-CM | POA: Diagnosis not present

## 2022-10-07 DIAGNOSIS — Z7901 Long term (current) use of anticoagulants: Secondary | ICD-10-CM | POA: Insufficient documentation

## 2022-10-07 DIAGNOSIS — I130 Hypertensive heart and chronic kidney disease with heart failure and stage 1 through stage 4 chronic kidney disease, or unspecified chronic kidney disease: Secondary | ICD-10-CM | POA: Diagnosis not present

## 2022-10-07 DIAGNOSIS — G4733 Obstructive sleep apnea (adult) (pediatric): Secondary | ICD-10-CM | POA: Insufficient documentation

## 2022-10-07 DIAGNOSIS — I428 Other cardiomyopathies: Secondary | ICD-10-CM | POA: Insufficient documentation

## 2022-10-07 DIAGNOSIS — I4819 Other persistent atrial fibrillation: Secondary | ICD-10-CM | POA: Diagnosis not present

## 2022-10-07 DIAGNOSIS — I5022 Chronic systolic (congestive) heart failure: Secondary | ICD-10-CM | POA: Diagnosis not present

## 2022-10-07 DIAGNOSIS — N1832 Chronic kidney disease, stage 3b: Secondary | ICD-10-CM | POA: Insufficient documentation

## 2022-10-07 DIAGNOSIS — I480 Paroxysmal atrial fibrillation: Secondary | ICD-10-CM | POA: Diagnosis not present

## 2022-10-07 DIAGNOSIS — R600 Localized edema: Secondary | ICD-10-CM | POA: Diagnosis not present

## 2022-10-07 LAB — BASIC METABOLIC PANEL
Anion gap: 8 (ref 5–15)
BUN: 14 mg/dL (ref 8–23)
CO2: 25 mmol/L (ref 22–32)
Calcium: 8.9 mg/dL (ref 8.9–10.3)
Chloride: 107 mmol/L (ref 98–111)
Creatinine, Ser: 1.5 mg/dL — ABNORMAL HIGH (ref 0.61–1.24)
GFR, Estimated: 51 mL/min — ABNORMAL LOW (ref >60)
Glucose, Bld: 79 mg/dL (ref 70–99)
Potassium: 3.7 mmol/L (ref 3.5–5.1)
Sodium: 140 mmol/L (ref 135–145)

## 2022-10-07 LAB — BRAIN NATRIURETIC PEPTIDE: B Natriuretic Peptide: 121 pg/mL — ABNORMAL HIGH (ref 0.0–100.0)

## 2022-10-07 MED ORDER — EMPAGLIFLOZIN 10 MG PO TABS: 10.0000 mg | ORAL_TABLET | Freq: Every day | ORAL | 6 refills | Status: DC

## 2022-10-07 MED ORDER — AMLODIPINE BESYLATE 10 MG PO TABS: 10.0000 mg | ORAL_TABLET | Freq: Every day | ORAL | 6 refills | Status: DC

## 2022-10-07 NOTE — Patient Instructions (Signed)
Medication Changes:  Increase Amlodipine to 10 mg Daily  START Jardiance 10 mg Daily  Lab Work:  Labs done today, we will call you for abnormal results  Testing/Procedures:  Your physician has requested that you have an echocardiogram. Echocardiography is a painless test that uses sound waves to create images of your heart. It provides your doctor with information about the size and shape of your heart and how well your heart's chambers and valves are working. This procedure takes approximately one hour. There are no restrictions for this procedure. Please do NOT wear cologne, perfume, aftershave, or lotions (deodorant is allowed). Please arrive 15 minutes prior to your appointment time. IN 6 MONTHS  Referrals:  none  Special Instructions // Education:  Do the following things EVERYDAY: Weigh yourself in the morning before breakfast. Write it down and keep it in a log. Take your medicines as prescribed Eat low salt foods--Limit salt (sodium) to 2000 mg per day.  Stay as active as you can everyday Limit all fluids for the day to less than 2 liters  Please wear your compression hose daily, place them on as soon as you get up in the morning and remove before you go to bed at night.   Follow-Up in: 6 months with an echocardiogram   At the [facility], you and your health needs are our priority. We have a designated team specialized in the treatment of Heart Failure. This Care Team includes your primary Heart Failure Specialized Cardiologist (physician), Advanced Practice Providers (APPs- Physician Assistants and Nurse Practitioners), and Pharmacist who all work together to provide you with the care you need, when you need it.   You may see any of the following providers on your designated Care Team at your next follow up:  Dr. Arvilla Meres Dr. Marca Ancona Dr. Marcos Eke, NP Robbie Lis, [place] [facility] [place],  [place] Brynda Peon, NP Karle Plumber, PharmD   Please be sure to bring in all your medications bottles to every appointment.   Need to Contact us:  If you have any questions or concerns before your next appointment please send [place] a message through [place] or call our office at [phone].    TO LEAVE A MESSAGE FOR THE NURSE SELECT OPTION 2, PLEASE LEAVE A MESSAGE INCLUDING: YOUR NAME DATE OF BIRTH CALL BACK NUMBER REASON FOR CALL**this is important as we prioritize the call backs  YOU WILL RECEIVE A CALL BACK THE SAME DAY AS LONG AS YOU CALL BEFORE 4:00 PM

## 2022-10-07 NOTE — Progress Notes (Signed)
Advanced Heart Failure Clinic Note    Primary Cardiologist: Dr. Eden Emms HF: Dr Gala Romney  PCP: Dr Valentina Lucks Urology:  Dr Retta Diones   HPI: [name] is a 67 year old male with a past medical history of tachy mediated cardiomyopathy, persistent atrial fibrillation (on Eliquis), systolic CHF (EF 78-29%) and CKD 3b.   He was first diagnosed with Afib in 02/2016, seen in the Afib clinic and the ED for this. He underwent 2 DC-CV and only held NSR for a few days. He started Amiodarone.   Admitted 04/16/16-04/27/16 with rapid afib and decompensation. He was started on milrinone for marginal mixed venous sat of 57% and optimization with plans for repeat DCCV after loading with IV Amiodarone. Underwent TEE/DCCV on 04/23/16 with restoration of NSR.He was continued on Eliquis for anticoagulation at discharge, as he did have some evidence of LV thrombus on Echo, however it appeared to have resolved when he had a TEE on 04/23/16. Overall diuresed 9.5L and discharge weight was 217 pounds.   July 2019 he had kidney stones and underwent lithotripsy.   Echo 7/20 EF 50-55%.   In 7/20 was doing very well. EF 50-55%  Maintaining NSR on amio 200 daily. Amio cut back to 100 daily due to sinus brady with HR in 40s. Several weeks later presented for a sick visit with recurrent AF with RVR, We reloaded amio with 400 bid and scheduled DC-CV. Several days later presented for DC-CV and was in NSR.   Underwent PVI with Dr. Johney Frame 2/21.    Zio in 8/21 Sinus avg HR 56 with nighttime brady in 30s. 5% PVCs. No AF  Echo 8/21 EF 60-65% Echo 8/23: EF 45-50%, LV with GHK, GIDD, normal RV  Presented to ED 1/24 with abdominal pain 2/2 strangulated abdominal wall hernia. Had emergent laparotomy and repair of hernia and SBO.  Discharge weight 250 lbs.   S/p a fib ablation 06/30/22 with Dr. Nelly Laurence  Today he returns for AHF follow up. Says he feels fair. Denies CP or significant SOB. No edema, orthopnea or PND. Stopped eplerenone due  to rash    Cardiac studeies reviewed: Echo 8/23: EF 45-50%, LV with GHK, GIDD, normal RV Echo 8/21 EF 60-65% Echo 3/18 EF 40-45% RV mildly HK  Echo 2/19 EF 40-45% RV mildly reduced ECHO 08/01/2018 EF 50-55%   Review of systems complete and found to be negative unless listed in HPI.   Past Medical History:  Diagnosis Date   Asthma, persistent    BPH (benign prostatic hyperplasia) 2014   CHF (congestive heart failure) (HCC)    Combined hyperlipidemia    Dysrhythmia    ATRIAL FIBRILLATION   GERD (gastroesophageal reflux disease)    History of kidney stones    Hypertension    Lymphocytic colitis 07/2011   MICROSCOPIC   Obesity    Persistent atrial fibrillation (HCC)    Renal stone 04/2012   Seasonal allergic rhinitis    Sleep apnea    does not use a cpap machine   Current Outpatient Medications  Medication Sig Dispense Refill   acetaminophen (TYLENOL) 325 MG tablet Take 2 tablets (650 mg total) by mouth every 6 (six) hours as needed for mild pain or headache.     albuterol (PROVENTIL HFA;VENTOLIN HFA) 108 (90 Base) MCG/ACT inhaler Inhale 2 puffs into the lungs every 6 (six) hours as needed for wheezing or shortness of breath.     amLODipine (NORVASC) 5 MG tablet TAKE 1 TABLET (5 MG TOTAL) BY MOUTH DAILY.  90 tablet 1   ELIQUIS 5 MG TABS tablet TAKE 1 TABLET BY MOUTH TWICE A DAY 60 tablet 2   fluticasone-salmeterol (ADVAIR DISKUS) 250-50 MCG/ACT AEPB Inhale 1 puff into the lungs 2 (two) times daily as needed (shortness of breath).     furosemide (LASIX) 40 MG tablet Take 40 mg by mouth 2 (two) times a week.     pantoprazole (PROTONIX) 40 MG tablet Take 40 mg by mouth daily.     sacubitril-valsartan (ENTRESTO) 97-103 MG Take 1 tablet by mouth 2 (two) times daily. NEEDS FOLLOW UP APPOINTMENT FOR MORE REFILLS 60 tablet 2   tadalafil (CIALIS) 5 MG tablet Take 5 mg by mouth daily.     eplerenone (INSPRA) 25 MG tablet Take 1 tablet (25 mg total) by mouth daily. (Patient not taking:  Reported on 10/07/2022) 90 tablet 3   No current facility-administered medications for this encounter.   Allergies  Allergen Reactions   Ibuprofen Other (See Comments)    Has a-Fib, unable to take   Isosorbide Other (See Comments)    HEADACHES from Imdur   Lisinopril Cough   Digoxin And Related Rash   Spironolactone Rash   Social History   Socioeconomic History   Marital status: Married    Spouse name: Not on file   Number of children: Not on file   Years of education: Not on file   Highest education level: Not on file  Occupational History   Occupation: GREENHOUSE MANAGEMENT  Tobacco Use   Smoking status: Never   Smokeless tobacco: Never  Vaping Use   Vaping status: Never Used  Substance and Sexual Activity   Alcohol use: No   Drug use: No   Sexual activity: Not on file  Other Topics Concern   Not on file  Social History Narrative   Lives in [place] with his spouse   He works as a Museum/gallery curator Strain: Not on file  Food Insecurity: No Food Insecurity (02/11/2022)   Hunger Vital Sign    Worried About Running Out of Food in the Last Year: Never true    Ran Out of Food in the Last Year: Never true  Transportation Needs: No Transportation Needs (02/11/2022)   PRAPARE - Administrator, Civil Service (Medical): No    Lack of Transportation (Non-Medical): No  Physical Activity: Not on file  Stress: Not on file  Social Connections: Not on file  Intimate Partner Violence: Not At Risk (02/11/2022)   Humiliation, Afraid, Rape, and Kick questionnaire    Fear of Current or Ex-Partner: No    Emotionally Abused: No    Physically Abused: No    Sexually Abused: No   Family History  Problem Relation Age of Onset   Hypertension Mother     Vitals:   10/07/22 1444  BP: (!) 150/80  Pulse: 64  SpO2: 95%  Weight: 113.2 kg (249 lb 9.6 oz)    Wt Readings from Last 3 Encounters:  10/07/22 113.2 kg (249 lb 9.6  oz)  07/28/22 110.8 kg (244 lb 3.2 oz)  06/30/22 111.6 kg (246 lb)   PHYSICAL EXAM: General:  Well appearing. No resp difficulty HEENT: normal Neck: supple. no JVD. Carotids 2+ bilat; no bruits. No lymphadenopathy or thryomegaly appreciated. Cor: PMI nondisplaced. Regular rate & rhythm. No rubs, gallops or murmurs. Lungs: clear Abdomen: soft, nontender, nondistended. No hepatosplenomegaly. No bruits or masses. Good bowel sounds. Extremities: no cyanosis, clubbing,  rash, tr-1+ edema Neuro: alert & orientedx3, cranial nerves grossly intact. moves all 4 extremities w/o difficulty. Affect pleasant  EKG: sinus rhythm with faster junctional at 62 RBBB.   (Personally reviewed)    ReDS 30%  ASSESSMENT & PLAN:  1. Chronic systolic CHF: EF 15-40%.  NICM, felt to be tachy mediated.  - Echo 06/2016 LVEF 40%. - Echo 02/2017: EF 40-45% - Echo 7/20 EF 50-55%.   - Echo 8/21 EF 60-65% - Echo 8/23: EF 45-50%, LV with GHK, GIDD, normal RV - Stable NYHA I  - Volume status stable Takes lasix 40 mg on Mondays and Fridays - Continue Entresto to 97/103 BID. - Failed spiro and eplernone d/t rash and HA.  - Will start Jardiance 10mg  daily  - No BB with bradycardia.  2. Paroxsymal atrial fibrillation and frequent PVCs - s/p PVI 2/21. Amio stopped - Zio in 8/21  Sinus avg HR 56 with nighttime brady in 30s. 5% PVCs. No AF -> amio restarted by Dr. Johney Frame - s/p a fib ablation 06/30/22 with Dr. Nelly Laurence - Continue Eliquis. No bleeding issues. Now off amio  3. Hypertension - BP slightly elevated. Increase amlodipine to 10 daily   4. History of LV thrombus - Resolved. No change.   5. Chronic kidney disease stage IIIb:  - baseline creatinine 1.5-2.0.  - stable at 1.6 in 1/23 - follows with Dr. Malen Gauze, last seen a year ago.  - will start Jardiance 10 daily  6. OSA - Sleep study 4/18 with mild OSA. AHI 13.8/hr.  - Has never gotten CPAP - > will need to revisit. Has f/u with Dr. Mayford Knife  7. LE edema  -  likely due to venous insufficiency - wear compression socks   Juan Meres, MD  3:14 PM 10/07/22

## 2022-10-07 NOTE — Telephone Encounter (Signed)
Advanced Heart Failure Patient Advocate Encounter  The patient was approved for a [employer] grant that will help cover the cost of Entresto, Eplerenone, Jardiance.  Total amount awarded, $10,000.  Effective: 09/07/2022 - 09/06/2023.  BIN [id number] PCN [id number] Group [id number] ID [id number]  Patient provided with approval and processing information in office.  Burnell Blanks, CPhT Rx Patient Advocate Phone: [phone]

## 2022-10-08 ENCOUNTER — Ambulatory Visit: Payer: Medicare HMO | Admitting: Cardiovascular Disease

## 2022-10-17 ENCOUNTER — Observation Stay (HOSPITAL_COMMUNITY)
Admission: EM | Admit: 2022-10-17 | Discharge: 2022-10-18 | Disposition: A | Discharge status: Home / Self Care | Payer: Medicare HMO | Attending: Cardiovascular Disease | Admitting: Cardiovascular Disease

## 2022-10-17 ENCOUNTER — Encounter (HOSPITAL_COMMUNITY): Payer: Self-pay

## 2022-10-17 ENCOUNTER — Other Ambulatory Visit: Payer: Self-pay

## 2022-10-17 DIAGNOSIS — R002 Palpitations: Secondary | ICD-10-CM

## 2022-10-17 DIAGNOSIS — I5022 Chronic systolic (congestive) heart failure: Secondary | ICD-10-CM | POA: Insufficient documentation

## 2022-10-17 DIAGNOSIS — I130 Hypertensive heart and chronic kidney disease with heart failure and stage 1 through stage 4 chronic kidney disease, or unspecified chronic kidney disease: Secondary | ICD-10-CM | POA: Diagnosis not present

## 2022-10-17 DIAGNOSIS — I4819 Other persistent atrial fibrillation: Principal | ICD-10-CM | POA: Insufficient documentation

## 2022-10-17 DIAGNOSIS — I2510 Atherosclerotic heart disease of native coronary artery without angina pectoris: Secondary | ICD-10-CM | POA: Diagnosis not present

## 2022-10-17 DIAGNOSIS — J45909 Unspecified asthma, uncomplicated: Secondary | ICD-10-CM | POA: Insufficient documentation

## 2022-10-17 DIAGNOSIS — Z7901 Long term (current) use of anticoagulants: Secondary | ICD-10-CM | POA: Diagnosis not present

## 2022-10-17 DIAGNOSIS — Z79899 Other long term (current) drug therapy: Secondary | ICD-10-CM | POA: Insufficient documentation

## 2022-10-17 DIAGNOSIS — N1832 Chronic kidney disease, stage 3b: Secondary | ICD-10-CM | POA: Diagnosis not present

## 2022-10-17 DIAGNOSIS — I4891 Unspecified atrial fibrillation: Principal | ICD-10-CM | POA: Diagnosis present

## 2022-10-17 LAB — MAGNESIUM: Magnesium: 2.2 mg/dL (ref 1.7–2.4)

## 2022-10-17 LAB — CBC WITH DIFFERENTIAL/PLATELET
Abs Immature Granulocytes: 0.01 10*3/uL (ref 0.00–0.07)
Basophils Absolute: 0 10*3/uL (ref 0.0–0.1)
Basophils Relative: 1 %
Eosinophils Absolute: 0.2 10*3/uL (ref 0.0–0.5)
Eosinophils Relative: 3 %
HCT: 37 % — ABNORMAL LOW (ref 39.0–52.0)
Hemoglobin: 11.3 g/dL — ABNORMAL LOW (ref 13.0–17.0)
Immature Granulocytes: 0 %
Lymphocytes Relative: 20 %
Lymphs Abs: 1.2 10*3/uL (ref 0.7–4.0)
MCH: 22.5 pg — ABNORMAL LOW (ref 26.0–34.0)
MCHC: 30.5 g/dL (ref 30.0–36.0)
MCV: 73.6 fL — ABNORMAL LOW (ref 80.0–100.0)
Monocytes Absolute: 0.6 10*3/uL (ref 0.1–1.0)
Monocytes Relative: 10 %
Neutro Abs: 3.9 10*3/uL (ref 1.7–7.7)
Neutrophils Relative %: 66 %
Platelets: 376 10*3/uL (ref 150–400)
RBC: 5.03 MIL/uL (ref 4.22–5.81)
RDW: 17.2 % — ABNORMAL HIGH (ref 11.5–15.5)
WBC: 5.9 10*3/uL (ref 4.0–10.5)
nRBC: 0 % (ref 0.0–0.2)

## 2022-10-17 LAB — BASIC METABOLIC PANEL
Anion gap: 11 (ref 5–15)
BUN: 19 mg/dL (ref 8–23)
CO2: 20 mmol/L — ABNORMAL LOW (ref 22–32)
Calcium: 8.9 mg/dL (ref 8.9–10.3)
Chloride: 107 mmol/L (ref 98–111)
Creatinine, Ser: 1.84 mg/dL — ABNORMAL HIGH (ref 0.61–1.24)
GFR, Estimated: 40 mL/min — ABNORMAL LOW (ref >60)
Glucose, Bld: 88 mg/dL (ref 70–99)
Potassium: 4 mmol/L (ref 3.5–5.1)
Sodium: 138 mmol/L (ref 135–145)

## 2022-10-17 MED ORDER — AMLODIPINE BESYLATE 10 MG PO TABS
10.0000 mg | ORAL_TABLET | Freq: Every day | ORAL | Status: DC
  Administered 2022-10-18: 10 mg via ORAL
  Filled 2022-10-17: qty 1

## 2022-10-17 MED ORDER — AMIODARONE HCL IN DEXTROSE 360-4.14 MG/200ML-% IV SOLN
30.0000 mg/h | INTRAVENOUS | Status: DC
  Administered 2022-10-17 – 2022-10-18 (×3): 30 mg/h via INTRAVENOUS
  Filled 2022-10-17 (×4): qty 200

## 2022-10-17 MED ORDER — LACTATED RINGERS IV BOLUS: 500.0000 mL | Freq: Once | INTRAVENOUS | Status: DC

## 2022-10-17 MED ORDER — AMIODARONE HCL IN DEXTROSE 360-4.14 MG/200ML-% IV SOLN
60.0000 mg/h | INTRAVENOUS | Status: AC
  Administered 2022-10-17 (×2): 60 mg/h via INTRAVENOUS

## 2022-10-17 MED ORDER — ACETAMINOPHEN 325 MG PO TABS: 650.0000 mg | ORAL_TABLET | ORAL | Status: DC | PRN

## 2022-10-17 MED ORDER — MOMETASONE FURO-FORMOTEROL FUM 200-5 MCG/ACT IN AERO
2.0000 | INHALATION_SPRAY | Freq: Two times a day (BID) | RESPIRATORY_TRACT | Status: DC
  Administered 2022-10-17 – 2022-10-18 (×2): 2 via RESPIRATORY_TRACT
  Filled 2022-10-17: qty 8.8

## 2022-10-17 MED ORDER — APIXABAN 5 MG PO TABS
5.0000 mg | ORAL_TABLET | Freq: Two times a day (BID) | ORAL | Status: DC
  Administered 2022-10-17 – 2022-10-18 (×2): 5 mg via ORAL
  Filled 2022-10-17 (×2): qty 1

## 2022-10-17 MED ORDER — APIXABAN 5 MG PO TABS: 5.0000 mg | ORAL_TABLET | Freq: Two times a day (BID) | ORAL | Status: DC

## 2022-10-17 MED ORDER — SACUBITRIL-VALSARTAN 97-103 MG PO TABS
1.0000 | ORAL_TABLET | Freq: Two times a day (BID) | ORAL | Status: DC
  Administered 2022-10-17 – 2022-10-18 (×2): 1 via ORAL
  Filled 2022-10-17 (×3): qty 1

## 2022-10-17 MED ORDER — AMIODARONE LOAD VIA INFUSION
150.0000 mg | Freq: Once | INTRAVENOUS | Status: AC
  Administered 2022-10-17: 150 mg via INTRAVENOUS
  Filled 2022-10-17: qty 83.34

## 2022-10-17 MED ORDER — ONDANSETRON HCL 4 MG/2ML IJ SOLN: 4.0000 mg | Freq: Four times a day (QID) | INTRAMUSCULAR | Status: DC | PRN

## 2022-10-17 MED ORDER — SODIUM CHLORIDE 0.9 % IV SOLN: INTRAVENOUS | Status: DC

## 2022-10-17 MED ORDER — PANTOPRAZOLE SODIUM 40 MG PO TBEC
40.0000 mg | DELAYED_RELEASE_TABLET | Freq: Every day | ORAL | Status: DC
  Administered 2022-10-18: 40 mg via ORAL
  Filled 2022-10-17: qty 1

## 2022-10-17 NOTE — ED Notes (Signed)
Pt well appearing upon transport to floor.

## 2022-10-17 NOTE — ED Notes (Signed)
Transfer of care report received by previous Veterinary surgeon.

## 2022-10-17 NOTE — ED Notes (Addendum)
ED TO INPATIENT HANDOFF REPORT  ED Nurse Name and Phone #: Nehemiah Settle [phone]  S Name/Age/Gender [name] 67 y.o. male Room/Bed: 030C/030C  Code Status   Code Status: Full Code  Home/SNF/Other Home Patient oriented to: self, place, time, and situation Is this baseline? Yes   Triage Complete: Triage complete  Chief Complaint Atrial fibrillation (HCC) [I48.91]  Triage Note No notes on file   Allergies Allergies  Allergen Reactions   Ibuprofen Other (See Comments)    Has a-Fib, unable to take   Isosorbide Other (See Comments)    HEADACHES from Imdur   Lisinopril Cough   Digoxin And Related Rash   Eplerenone Rash   Spironolactone Rash    Level of Care/Admitting Diagnosis ED Disposition     ED Disposition  Admit   Condition  --   Comment  Hospital Area: [facility] [[id number]]  Level of Care: Telemetry Cardiac [[id number]]  May admit patient to [facility] or [facility] if equivalent level of care is available:: No  Covid Evaluation: Asymptomatic - no recent exposure (last 10 days) testing not required  Diagnosis: Atrial fibrillation (HCC) [427.31.ICD-9-CM]  Admitting Physician: Maurice Small [[id number]]  Attending Physician: Maurice Small [[id number]]  Certification:: I certify this patient is being admitted for an inpatient-only procedure  Expected Medical Readiness: 10/19/2022          B Medical/Surgery History Past Medical History:  Diagnosis Date   Asthma, persistent    BPH (benign prostatic hyperplasia) 2014   CHF (congestive heart failure) (HCC)    Combined hyperlipidemia    Dysrhythmia    ATRIAL FIBRILLATION   GERD (gastroesophageal reflux disease)    History of kidney stones    Hypertension    Lymphocytic colitis 07/2011   MICROSCOPIC   Obesity    Persistent atrial fibrillation (HCC)    Renal stone 04/2012   Seasonal allergic rhinitis    Sleep apnea    does not use a cpap machine   Past Surgical History:  Procedure  Laterality Date   ATRIAL FIBRILLATION ABLATION N/A 03/16/2019   Procedure: ATRIAL FIBRILLATION ABLATION;  Surgeon: Hillis Range, MD;  Location: [facility] INVASIVE CV LAB;  Service: Cardiovascular;  Laterality: N/A;   ATRIAL FIBRILLATION ABLATION N/A 06/30/2022   Procedure: ATRIAL FIBRILLATION ABLATION;  Surgeon: Maurice Small, MD;  Location: [facility] INVASIVE CV LAB;  Service: Cardiovascular;  Laterality: N/A;   BOWEL RESECTION N/A 02/07/2022   Procedure: SMALL BOWEL RESECTION;  Surgeon: Berna Bue, MD;  Location: [facility] OR;  Service: General;  Laterality: N/A;   CARDIOVERSION N/A 04/23/2016   Procedure: CARDIOVERSION;  Surgeon: Dolores Patty, MD;  Location: [facility] ENDOSCOPY;  Service: Cardiovascular;  Laterality: N/A;   CARDIOVERSION N/A 01/12/2022   Procedure: CARDIOVERSION;  Surgeon: Jodelle Red, MD;  Location: [facility] ENDOSCOPY;  Service: Cardiovascular;  Laterality: N/A;   CYSTOSCOPY WITH RETROGRADE PYELOGRAM, URETEROSCOPY AND STENT PLACEMENT Left 07/29/2017   Procedure: CYSTOSCOPY WITH RETROGRADE PYELOGRAM AND LEFT STENT PLACEMENT;  Surgeon: Marcine Matar, MD;  Location: [facility] ORS;  Service: Urology;  Laterality: Left;   EXTRACORPOREAL SHOCK WAVE LITHOTRIPSY Left 08/08/2017   Procedure: LEFT EXTRACORPOREAL SHOCK WAVE LITHOTRIPSY (ESWL);  Surgeon: Hildred Laser, MD;  Location: [facility] ORS;  Service: Urology;  Laterality: Left;   LAPAROTOMY N/A 02/07/2022   Procedure: EXPLORATORY LAPAROTOMY;  Surgeon: Berna Bue, MD;  Location: [facility] OR;  Service: General;  Laterality: N/A;   TEE WITHOUT CARDIOVERSION N/A 04/23/2016   Procedure: TRANSESOPHAGEAL ECHOCARDIOGRAM (TEE);  Surgeon: Dolores Patty, MD;  Location: [facility] ENDOSCOPY;  Service: Cardiovascular;  Laterality: N/A;   VENTRAL HERNIA REPAIR N/A 02/07/2022   Procedure: REPAIR INCARCERATED VENTRAL  HERNIA;  Surgeon: Berna Bue, MD;  Location: [facility] OR;  Service: General;  Laterality: N/A;   XI ROBOTIC ASSISTED SIMPLE PROSTATECTOMY N/A  12/25/2018   Procedure: XI ROBOTIC ASSISTED SIMPLE PROSTATECTOMY, REMOVAL OF BLADDER STONES;  Surgeon: Malen Gauze, MD;  Location: [facility] ORS;  Service: Urology;  Laterality: N/A;     A IV Location/Drains/Wounds Patient Lines/Drains/Airways Status     Active Line/Drains/Airways     Name Placement date Placement time Site Days   Peripheral IV 10/17/22 20 G Left Antecubital 10/17/22  0937  Antecubital  less than 1            Intake/Output Last 24 hours No intake or output data in the 24 hours ending 10/17/22 1037  Labs/Imaging Results for orders placed or performed during the hospital encounter of 10/17/22 (from the past 48 hour(s))  CBC with Differential     Status: Abnormal   Collection Time: 10/17/22  8:38 AM  Result Value Ref Range   WBC 5.9 4.0 - 10.5 K/uL   RBC 5.03 4.22 - 5.81 MIL/uL   Hemoglobin 11.3 (L) 13.0 - 17.0 g/dL   HCT 86.5 (L) 78.4 - 69.6 %   MCV 73.6 (L) 80.0 - 100.0 fL   MCH 22.5 (L) 26.0 - 34.0 pg   MCHC 30.5 30.0 - 36.0 g/dL   RDW 29.5 (H) 28.4 - 13.2 %   Platelets 376 150 - 400 K/uL   nRBC 0.0 0.0 - 0.2 %   Neutrophils Relative % 66 %   Neutro Abs 3.9 1.7 - 7.7 K/uL   Lymphocytes Relative 20 %   Lymphs Abs 1.2 0.7 - 4.0 K/uL   Monocytes Relative 10 %   Monocytes Absolute 0.6 0.1 - 1.0 K/uL   Eosinophils Relative 3 %   Eosinophils Absolute 0.2 0.0 - 0.5 K/uL   Basophils Relative 1 %   Basophils Absolute 0.0 0.0 - 0.1 K/uL   Immature Granulocytes 0 %   Abs Immature Granulocytes 0.01 0.00 - 0.07 K/uL    Comment: Performed at [facility], [address] [place], [place] [address]  Magnesium     Status: None   Collection Time: 10/17/22  8:38 AM  Result Value Ref Range   Magnesium 2.2 1.7 - 2.4 mg/dL    Comment: Performed at [facility], [address], [place], [place] [address]  Basic metabolic panel     Status: Abnormal   Collection Time: 10/17/22  8:38 AM  Result Value Ref Range   Sodium 138 135 - 145 mmol/L   Potassium  4.0 3.5 - 5.1 mmol/L   Chloride 107 98 - 111 mmol/L   CO2 20 (L) 22 - 32 mmol/L   Glucose, Bld 88 70 - 99 mg/dL    Comment: Glucose reference range applies only to samples taken after fasting for at least 8 hours.   BUN 19 8 - 23 mg/dL   Creatinine, Ser 6.64 (H) 0.61 - 1.24 mg/dL   Calcium 8.9 8.9 - 40.3 mg/dL   GFR, Estimated 40 (L) >60 mL/min    Comment: (NOTE) Calculated using the CKD-EPI Creatinine Equation (2021)    Anion gap 11 5 - 15    Comment: Performed at [facility], [address], [place], [place] [address]   No results found.  Pending Labs Unresulted Labs (From admission, onward)     Start     Ordered   10/18/22 0500  Basic metabolic panel  Tomorrow morning,   R        10/17/22 1020   10/18/22 0500  CBC  Tomorrow morning,   R        10/17/22 1020            Vitals/Pain Today's Vitals   10/17/22 0930 10/17/22 0945 10/17/22 1000 10/17/22 1015  BP: 121/85  (!) 119/90 122/88  Pulse: 94 (!) 108 (!) 44 77  Resp: 13 12 11 11   Temp:      TempSrc:      SpO2: 97% 97% 98% 98%    Isolation Precautions No active isolations  Medications Medications  amiodarone (NEXTERONE) 1.8 mg/mL load via infusion 150 mg (has no administration in time range)    Followed by  amiodarone (NEXTERONE PREMIX) 360-4.14 MG/200ML-% (1.8 mg/mL) IV infusion (has no administration in time range)    Followed by  amiodarone (NEXTERONE PREMIX) 360-4.14 MG/200ML-% (1.8 mg/mL) IV infusion (has no administration in time range)  acetaminophen (TYLENOL) tablet 650 mg (has no administration in time range)  apixaban (ELIQUIS) tablet 5 mg (has no administration in time range)  amLODipine (NORVASC) tablet 10 mg (has no administration in time range)  pantoprazole (PROTONIX) EC tablet 40 mg (has no administration in time range)  sacubitril-valsartan (ENTRESTO) 97-103 mg per tablet (has no administration in time range)  mometasone-formoterol (DULERA) 200-5 MCG/ACT inhaler 2 puff (has no  administration in time range)    Mobility walks     Focused Assessments    R Recommendations: See Admitting Provider Note  Report given to:   Additional Notes:   NPO at midnight

## 2022-10-17 NOTE — Plan of Care (Signed)

## 2022-10-17 NOTE — H&P (Addendum)
Cardiology Admission History and Physical   Patient ID: [name] MRN: [id number]; DOB: [birth year]   Admission date: 10/17/2022  PCP:  Kirby Funk, MD (Inactive)   [facility] Providers Cardiologist:  Charlton Haws, MD  Electrophysiologist:  Maurice Small, MD  Advanced Heart Failure:  Arvilla Meres, MD  Sleep Medicine:  Armanda Magic, MD       Chief Complaint:  recurrent atrial fibrillation  Patient Profile:   [name] is a 67 y.o. male with history of persistent atrial fibrillation, tachycardia mediated CM, CKD, hypertension, CAD, OSA, CKD IIIb who is being seen 10/17/2022 for the evaluation of recurrent atrial fibrillation.  History of Present Illness:   [name] presented to the ED today after noticing persistent arrhythmia consistent with atrial fibrillation on his Apple Watch x2 days. He did not come to the ED due to symptoms but rather because of his complex afib history and he is without symptoms of palpitations, chest tightness, shortness of breath. He was first diagnosed with afib in 2018 and underwent DCCV x2 but each time quickly returned to afib. He was started on Amiodarone and underwent TEE/DCCV in March of 2018 with restoration of NSR. Patient maintained NSR on Amiodarone 200mg  and dose was reduced to 100mg  due to sinus bradycardia (HR into the 40s) in 2020. Unfortunately he had recurrent afib with RVR in the setting of illness and was reloaded on higher dose Amiodarone. He was scheduled for DCCV but spontaneously converted. Dr. Johney Frame performed PVI ablation in 2021. Patient with recurrent afib this year and underwent repeat ablation with Dr. Nelly Laurence on 06/30/22. Patient was again taken off amiodarone s/p ablation and had no evidence of recurrent afib until the last few days.    Past Medical History:  Diagnosis Date   Asthma, persistent    BPH (benign prostatic hyperplasia) 2014   CHF (congestive heart failure) (HCC)    Combined hyperlipidemia     Dysrhythmia    ATRIAL FIBRILLATION   GERD (gastroesophageal reflux disease)    History of kidney stones    Hypertension    Lymphocytic colitis 07/2011   MICROSCOPIC   Obesity    Persistent atrial fibrillation (HCC)    Renal stone 04/2012   Seasonal allergic rhinitis    Sleep apnea    does not use a cpap machine    Past Surgical History:  Procedure Laterality Date   ATRIAL FIBRILLATION ABLATION N/A 03/16/2019   Procedure: ATRIAL FIBRILLATION ABLATION;  Surgeon: Hillis Range, MD;  Location: [facility] INVASIVE CV LAB;  Service: Cardiovascular;  Laterality: N/A;   ATRIAL FIBRILLATION ABLATION N/A 06/30/2022   Procedure: ATRIAL FIBRILLATION ABLATION;  Surgeon: Maurice Small, MD;  Location: [facility] INVASIVE CV LAB;  Service: Cardiovascular;  Laterality: N/A;   BOWEL RESECTION N/A 02/07/2022   Procedure: SMALL BOWEL RESECTION;  Surgeon: Berna Bue, MD;  Location: [facility] OR;  Service: General;  Laterality: N/A;   CARDIOVERSION N/A 04/23/2016   Procedure: CARDIOVERSION;  Surgeon: Dolores Patty, MD;  Location: [facility] ENDOSCOPY;  Service: Cardiovascular;  Laterality: N/A;   CARDIOVERSION N/A 01/12/2022   Procedure: CARDIOVERSION;  Surgeon: Jodelle Red, MD;  Location: [facility] ENDOSCOPY;  Service: Cardiovascular;  Laterality: N/A;   CYSTOSCOPY WITH RETROGRADE PYELOGRAM, URETEROSCOPY AND STENT PLACEMENT Left 07/29/2017   Procedure: CYSTOSCOPY WITH RETROGRADE PYELOGRAM AND LEFT STENT PLACEMENT;  Surgeon: Marcine Matar, MD;  Location: [facility] ORS;  Service: Urology;  Laterality: Left;   EXTRACORPOREAL SHOCK WAVE LITHOTRIPSY Left 08/08/2017   Procedure: LEFT  EXTRACORPOREAL SHOCK WAVE LITHOTRIPSY (ESWL);  Surgeon: Hildred Laser, MD;  Location: [facility] ORS;  Service: Urology;  Laterality: Left;   LAPAROTOMY N/A 02/07/2022   Procedure: EXPLORATORY LAPAROTOMY;  Surgeon: Berna Bue, MD;  Location: [facility] OR;  Service: General;  Laterality: N/A;   TEE WITHOUT CARDIOVERSION N/A 04/23/2016   Procedure:  TRANSESOPHAGEAL ECHOCARDIOGRAM (TEE);  Surgeon: Dolores Patty, MD;  Location: [facility] ENDOSCOPY;  Service: Cardiovascular;  Laterality: N/A;   VENTRAL HERNIA REPAIR N/A 02/07/2022   Procedure: REPAIR INCARCERATED VENTRAL  HERNIA;  Surgeon: Berna Bue, MD;  Location: [facility] OR;  Service: General;  Laterality: N/A;   XI ROBOTIC ASSISTED SIMPLE PROSTATECTOMY N/A 12/25/2018   Procedure: XI ROBOTIC ASSISTED SIMPLE PROSTATECTOMY, REMOVAL OF BLADDER STONES;  Surgeon: Malen Gauze, MD;  Location: [facility] ORS;  Service: Urology;  Laterality: N/A;     Medications Prior to Admission: Prior to Admission medications   Medication Sig Start Date End Date Taking? Authorizing Provider  acetaminophen (TYLENOL) 325 MG tablet Take 2 tablets (650 mg total) by mouth every 6 (six) hours as needed for mild pain or headache. 04/27/16   Graciella Freer, PA-C  albuterol (PROVENTIL HFA;VENTOLIN HFA) 108 (90 Base) MCG/ACT inhaler Inhale 2 puffs into the lungs every 6 (six) hours as needed for wheezing or shortness of breath.    [provider]  amLODipine (NORVASC) 10 MG tablet Take 1 tablet (10 mg total) by mouth daily. 10/07/22   Bensimhon, Bevelyn Buckles, MD  ELIQUIS 5 MG TABS tablet TAKE 1 TABLET BY MOUTH TWICE A DAY 09/28/22   Bensimhon, Bevelyn Buckles, MD  empagliflozin (JARDIANCE) 10 MG TABS tablet Take 1 tablet (10 mg total) by mouth daily before breakfast. 10/07/22   Bensimhon, Bevelyn Buckles, MD  eplerenone (INSPRA) 25 MG tablet Take 1 tablet (25 mg total) by mouth daily. Patient not taking: Reported on 10/07/2022 06/23/22   Alen Bleacher, NP  fluticasone-salmeterol (ADVAIR DISKUS) 250-50 MCG/ACT AEPB Inhale 1 puff into the lungs 2 (two) times daily as needed (shortness of breath).    [provider]  furosemide (LASIX) 40 MG tablet Take 40 mg by mouth 2 (two) times a week.    [provider]  pantoprazole (PROTONIX) 40 MG tablet Take 40 mg by mouth daily.    [provider]   sacubitril-valsartan (ENTRESTO) 97-103 MG Take 1 tablet by mouth 2 (two) times daily. NEEDS FOLLOW UP APPOINTMENT FOR MORE REFILLS 05/10/22   Bensimhon, Bevelyn Buckles, MD  tadalafil (CIALIS) 5 MG tablet Take 5 mg by mouth daily.    [provider]     Allergies:    Allergies  Allergen Reactions   Ibuprofen Other (See Comments)    Has a-Fib, unable to take   Isosorbide Other (See Comments)    HEADACHES from Imdur   Lisinopril Cough   Digoxin And Related Rash   Eplerenone Rash   Spironolactone Rash    Social History:   Social History   Socioeconomic History   Marital status: Married    Spouse name: Not on file   Number of children: Not on file   Years of education: Not on file   Highest education level: Not on file  Occupational History   Occupation: GREENHOUSE MANAGEMENT  Tobacco Use   Smoking status: Never   Smokeless tobacco: Never  Vaping Use   Vaping status: Never Used  Substance and Sexual Activity   Alcohol use: No   Drug use: No   Sexual activity:  Not on file  Other Topics Concern   Not on file  Social History Narrative   Lives in [place] with his spouse   He works as a Museum/gallery curator Strain: Not on file  Food Insecurity: No Food Insecurity (02/11/2022)   Hunger Vital Sign    Worried About Running Out of Food in the Last Year: Never true    Ran Out of Food in the Last Year: Never true  Transportation Needs: No Transportation Needs (02/11/2022)   PRAPARE - Administrator, Civil Service (Medical): No    Lack of Transportation (Non-Medical): No  Physical Activity: Not on file  Stress: Not on file  Social Connections: Not on file  Intimate Partner Violence: Not At Risk (02/11/2022)   Humiliation, Afraid, Rape, and Kick questionnaire    Fear of Current or Ex-Partner: No    Emotionally Abused: No    Physically Abused: No    Sexually Abused: No    Family History:   The patient's family  history includes Hypertension in his mother.    ROS:  Please see the history of present illness.  All other ROS reviewed and negative.     Physical Exam/Data:   Vitals:   10/17/22 1015 10/17/22 1030 10/17/22 1045 10/17/22 1102  BP: 122/88 (!) 131/91 118/83 (!) 123/93  Pulse: 77 91 (!) 123 (!) 104  Resp: 11 20 16 16   Temp:      TempSrc:      SpO2: 98% 99% 96% 96%   No intake or output data in the 24 hours ending 10/17/22 1114    10/07/2022    2:44 PM 07/28/2022    2:45 PM 06/30/2022    9:29 AM  Last 3 Weights  Weight (lbs) 249 lb 9.6 oz 244 lb 3.2 oz 246 lb  Weight (kg) 113.218 kg 110.768 kg 111.585 kg     There is no height or weight on file to calculate BMI.  General:  Well nourished, well developed, in no acute distress HEENT: normal Neck: no JVD Vascular: No carotid bruits; Distal pulses 2+ bilaterally   Cardiac:  normal S1, S2; irregularly irregular and rapid; no murmur  Lungs:  clear to auscultation bilaterally, no wheezing, rhonchi or rales  Abd: soft, nontender, no hepatomegaly  Ext: no edema Musculoskeletal:  No deformities, BUE and BLE strength normal and equal Skin: warm and dry  Neuro:  CNs 2-12 intact, no focal abnormalities noted Psych:  Normal affect    EKG:  The ECG that was done 10/17/22 was personally reviewed and demonstrates afib with RVR, ventricular rate ~130 with RBBB morphology  Relevant CV Studies:  09/04/21 TTE  IMPRESSIONS     1. Left ventricular ejection fraction, by estimation, is 45 to 50%. The  left ventricle has mildly decreased function. The left ventricle  demonstrates global hypokinesis. The left ventricular internal cavity size  was severely dilated. Left ventricular  diastolic parameters are consistent with Grade I diastolic dysfunction  (impaired relaxation).   2. Right ventricular systolic function is normal. The right ventricular  size is normal. There is normal pulmonary artery systolic pressure.   3. Left atrial size was  moderately dilated.   4. The mitral valve is normal in structure. Mild, eccentric, mitral valve  regurgitation. No evidence of mitral stenosis.   5. The aortic valve is tricuspid. Aortic valve regurgitation is not  visualized. No aortic stenosis is present.   6. The  inferior vena cava is normal in size with greater than 50%  respiratory variability, suggesting right atrial pressure of 3 mmHg.   Comparison(s): Prior images reviewed side by side. LV size has increased  from 2020-> LVIDd 6.3 cm -> 6.9 cm.   FINDINGS   Left Ventricle: Left ventricular ejection fraction, by estimation, is 45  to 50%. The left ventricle has mildly decreased function. The left  ventricle demonstrates global hypokinesis. The left ventricular internal  cavity size was severely dilated. There  is no left ventricular hypertrophy. Left ventricular diastolic parameters  are consistent with Grade I diastolic dysfunction (impaired relaxation).   Right Ventricle: The right ventricular size is normal. No increase in  right ventricular wall thickness. Right ventricular systolic function is  normal. There is normal pulmonary artery systolic pressure. The tricuspid  regurgitant velocity is 1.95 m/s, and   with an assumed right atrial pressure of 3 mmHg, the estimated right  ventricular systolic pressure is 18.2 mmHg.   Left Atrium: Left atrial size was moderately dilated.   Right Atrium: Right atrial size was normal in size.   Pericardium: There is no evidence of pericardial effusion.   Mitral Valve: The mitral valve is normal in structure. Mild mitral valve  regurgitation. No evidence of mitral valve stenosis.   Tricuspid Valve: The tricuspid valve is normal in structure. Tricuspid  valve regurgitation is not demonstrated. No evidence of tricuspid  stenosis.   Aortic Valve: The aortic valve is tricuspid. Aortic valve regurgitation is  not visualized. No aortic stenosis is present.   Pulmonic Valve: The  pulmonic valve was normal in structure. Pulmonic valve  regurgitation is not visualized. No evidence of pulmonic stenosis.   Aorta: The aortic root and ascending aorta are structurally normal, with  no evidence of dilitation.   Venous: The inferior vena cava is normal in size with greater than 50%  respiratory variability, suggesting right atrial pressure of 3 mmHg.   IAS/Shunts: No atrial level shunt detected by color flow Doppler.    Laboratory Data:  High Sensitivity Troponin:  No results for input(s): "TROPONINIHS" in the last 720 hours.    Chemistry Recent Labs  Lab 10/17/22 0838  NA 138  K 4.0  CL 107  CO2 20*  GLUCOSE 88  BUN 19  CREATININE 1.84*  CALCIUM 8.9  MG 2.2  GFRNONAA 40*  ANIONGAP 11    No results for input(s): "PROT", "ALBUMIN", "AST", "ALT", "ALKPHOS", "BILITOT" in the last 168 hours. Lipids No results for input(s): "CHOL", "TRIG", "HDL", "LABVLDL", "LDLCALC", "CHOLHDL" in the last 168 hours. Hematology Recent Labs  Lab 10/17/22 0838  WBC 5.9  RBC 5.03  HGB 11.3*  HCT 37.0*  MCV 73.6*  MCH 22.5*  MCHC 30.5  RDW 17.2*  PLT 376   Thyroid No results for input(s): "TSH", "FREET4" in the last 168 hours. BNPNo results for input(s): "BNP", "PROBNP" in the last 168 hours.  DDimer No results for input(s): "DDIMER" in the last 168 hours.   Radiology/Studies:  No results found.   Assessment and Plan:   Paroxysmal atrial fibrillation  Patient with hx of intermittent afib since 2018, now s/p ablation x2 (most recently in June of 2024. He presented to the ED today with recurrent afib with RVR.   Patient currently asymptomatic Qtc and CKD IIIb preclude use of AAD such as Tikosyn. Given prior success with Amiodarone, will reload Amiodarone IV today with plans for DCCV tomorrow (no missed doses of Eliquis).  Continue Eliquis 5mg   BID  Informed Consent   Shared Decision Making/Informed Consent The risks (stroke, cardiac arrhythmias rarely resulting  in the need for a temporary or permanent pacemaker, skin irritation or burns and complications associated with conscious sedation including aspiration, arrhythmia, respiratory failure and death), benefits (restoration of normal sinus rhythm) and alternatives of a direct current cardioversion were explained in detail to Mr. Palley and he agrees to proceed.        Chronic systolic heart failure  Patient with hx NICM, suspected tachy-mediated. Latest EF 45-50% per August 2023 TTE. NYHA I symptoms on exam today and patient is euvolemic.  Continue Entresto 97-103mg  BID Hold Jardiance with pending DCCV (per anesthesia guidelines) Hx bradycardia with BB, will defer use Patient takes Lasix 40mg  on Mondays and Fridays. Will dose PRN this admission.  Hypertension  Continue Amlodipine 10mg  (increased by Dr. Gala Romney on 9/12). Continue Entresto as above.  CKD IIIb  Creatinine today is 1.84. Recent baseline on review of labs has been 1.5-2.1.  Hold Jardiance with pending DCCV/sedation Continue outpatient follow up with Dr. Malen Gauze  OSA  Prior sleep study has indicated OSA but patient not on CPAP. Currently has follow up scheduled with Dr. Mayford Knife. OSA management will be important given its association with afib.     Risk Assessment/Risk Scores:       New York Heart Association (NYHA) Functional Class NYHA Class I  CHA2DS2-VASc Score = 4   This indicates a 4.8% annual risk of stroke. The patient's score is based upon: CHF History: 1 HTN History: 1 Diabetes History: 0 Stroke History: 0 Vascular Disease History: 1 Age Score: 1 Gender Score: 0     Code Status: Full Code  Severity of Illness: The appropriate patient status for this patient is INPATIENT. Inpatient status is judged to be reasonable and necessary in order to provide the required intensity of service to ensure the patient's safety. The patient's presenting symptoms, physical exam findings, and initial radiographic and  laboratory data in the context of their chronic comorbidities is felt to place them at high risk for further clinical deterioration. Furthermore, it is not anticipated that the patient will be medically stable for discharge from the hospital within 2 midnights of admission.   * I certify that at the point of admission it is my clinical judgment that the patient will require inpatient hospital care spanning beyond 2 midnights from the point of admission due to high intensity of service, high risk for further deterioration and high frequency of surveillance required.*   For questions or updates, please contact [facility] Please consult [email or web address] for contact info under     Signed, Perlie Gold, PA-C  10/17/2022 11:14 AM

## 2022-10-17 NOTE — ED Provider Notes (Signed)
[place] EMERGENCY DEPARTMENT AT [facility] Provider Note   CSN: [id number] Arrival date & time: 10/17/22  7829     History  Chief Complaint  Patient presents with   Palpitations    [name] is a 67 y.o. male with PMH as listed below who presents with palpitations. 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  Patient has history of A-fib with multiple treatments with amiodarone and cardioversions.  Most recently had A-fib ablation on 06/30/2022 with Dr. Nelly Laurence.  Not currently taking amiodarone.  Today patient presents with palpitations that began last night, sxs that he associates with his afib. Hasn't had any afib since ablation in June. Endorses very mild intermittent chest pain as well as mild dyspnea and fatigue. Denies f/c, flu-like sxs, N/V/D/C, leg swelling. Otherwise had been in his NSOH. Last dose of eliquis was this AM.    Past Medical History:  Diagnosis Date   Asthma, persistent    BPH (benign prostatic hyperplasia) 2014   CHF (congestive heart failure) (HCC)    Combined hyperlipidemia    Dysrhythmia    ATRIAL FIBRILLATION   GERD (gastroesophageal reflux disease)    History of kidney stones    Hypertension    Lymphocytic colitis 07/2011   MICROSCOPIC   Obesity    Persistent atrial fibrillation (HCC)    Renal stone 04/2012   Seasonal allergic rhinitis    Sleep apnea    does not use a cpap machine       Home Medications Prior to Admission medications   Medication Sig Start Date End Date Taking? Authorizing Provider  acetaminophen (TYLENOL) 325 MG tablet Take 2 tablets (650 mg total) by mouth every 6 (six) hours as needed for mild pain or headache. 04/27/16   Graciella Freer, PA-C  albuterol (PROVENTIL HFA;VENTOLIN HFA) 108 (90 Base) MCG/ACT inhaler Inhale 2 puffs into the lungs every 6 (six) hours as needed for wheezing or shortness of breath.    [provider]  amLODipine  (NORVASC) 10 MG tablet Take 1 tablet (10 mg total) by mouth daily. 10/07/22   Bensimhon, Bevelyn Buckles, MD  ELIQUIS 5 MG TABS tablet TAKE 1 TABLET BY MOUTH TWICE A DAY 09/28/22   Bensimhon, Bevelyn Buckles, MD  empagliflozin (JARDIANCE) 10 MG TABS tablet Take 1 tablet (10 mg total) by mouth daily before breakfast. 10/07/22   Bensimhon, Bevelyn Buckles, MD  eplerenone (INSPRA) 25 MG tablet Take 1 tablet (25 mg total) by mouth daily. Patient not taking: Reported on 10/07/2022 06/23/22   Alen Bleacher, NP  fluticasone-salmeterol (ADVAIR DISKUS) 250-50 MCG/ACT AEPB Inhale 1 puff into the lungs 2 (two) times daily as needed (shortness of breath).    [provider]  furosemide (LASIX) 40 MG tablet Take 40 mg by mouth 2 (two) times a week.    [provider]  pantoprazole (PROTONIX) 40 MG tablet Take 40 mg by mouth daily.    [provider]  sacubitril-valsartan (ENTRESTO) 97-103 MG Take 1 tablet by mouth 2 (two) times daily. NEEDS FOLLOW UP APPOINTMENT FOR MORE REFILLS 05/10/22   Bensimhon, Bevelyn Buckles, MD  tadalafil (CIALIS) 5 MG tablet Take 5 mg by mouth daily.    [provider]      Allergies    Ibuprofen, Isosorbide, Lisinopril, Digoxin and related, and Spironolactone    Review of Systems   Review of Systems A 10 point review of systems was performed and is negative  unless otherwise reported in HPI.  Physical Exam Updated Vital Signs BP 122/88   Pulse 77   Temp 98.5 F (36.9 C) (Oral)   Resp 11   SpO2 98%  Physical Exam General: Normal appearing male, lying in bed.  HEENT: Sclera anicteric, MMM, trachea midline.  Cardiology: irregularly irregular tachycardic rate, no murmurs/rubs/gallops.  Resp: Normal respiratory rate and effort. CTAB, no wheezes, rhonchi, crackles.  Abd: Soft, non-tender, non-distended. No rebound tenderness or guarding.  GU: Deferred. MSK: No peripheral edema or signs of trauma.  Skin: warm, dry.  Neuro: A&Ox4, CNs II-XII grossly intact. MAEs. Sensation  grossly intact.  Psych: Normal mood and affect.   ED Results / Procedures / Treatments   Labs (all labs ordered are listed, but only abnormal results are displayed) Labs Reviewed  CBC WITH DIFFERENTIAL/PLATELET - Abnormal; Notable for the following components:      Result Value   Hemoglobin 11.3 (*)    HCT 37.0 (*)    MCV 73.6 (*)    MCH 22.5 (*)    RDW 17.2 (*)    All other components within normal limits  BASIC METABOLIC PANEL - Abnormal; Notable for the following components:   CO2 20 (*)    Creatinine, Ser 1.84 (*)    GFR, Estimated 40 (*)    All other components within normal limits  MAGNESIUM    EKG EKG Interpretation Date/Time:  Sunday October 17 2022 08:43:45 EDT Ventricular Rate:  129 PR Interval:  40 QRS Duration:  157 QT Interval:  344 QTC Calculation: 504 R Axis:   91  Text Interpretation: Atrial fibrillation with rapid ventricular response RBBB and LPFB Baseline wander in lead(s) V1 Confirmed by Vivi Barrack [phone]) on 10/17/2022 9:14:14 AM  Radiology No results found.  Procedures Procedures    Medications Ordered in ED Medications  amiodarone (NEXTERONE) 1.8 mg/mL load via infusion 150 mg (has no administration in time range)    Followed by  amiodarone (NEXTERONE PREMIX) 360-4.14 MG/200ML-% (1.8 mg/mL) IV infusion (has no administration in time range)    Followed by  amiodarone (NEXTERONE PREMIX) 360-4.14 MG/200ML-% (1.8 mg/mL) IV infusion (has no administration in time range)    ED Course/ Medical Decision Making/ A&P                          Medical Decision Making Amount and/or Complexity of Data Reviewed Labs: ordered. Decision-making details documented in ED Course.    This patient presents to the ED for concern of Afib w/ RVR, this involves an extensive number of treatment options, and is a complaint that carries with it a high risk of complications and morbidity.  I considered the following differential and admission for this acute,  potentially life threatening condition.   MDM:    EKG demonstrates Afib w/ RVR. Patient has h/o recent ablation. He is normotensive and no significant chest pain, no syncope, he is stable now and does not require emergent cardioversion. He is compliant with eliquis, has no frank contraindications to cardioversion, however he states that on occasions where he has been cardioverted without amiodarone, the cardioversions in the ED weren't successful. However the occasions where he was cardioverted with amiodarone on board were successful. No recent infectious symptoms to suggest that afib is result of infection or sepsis. Will consult with cardiology to discuss plan.   Clinical Course as of 10/17/22 1018  Sun Oct 17, 2022  0913 CBC with Differential(!) Stable from prior [HN]  0945 Basic metabolic panel(!) Electrolytes okay.  [HN]  1012 D/w Dr Nelly Laurence who suggested dofetilide as a possible solution to patient's Afib, however will need another QTc measurement after in NSR, and will likely need amiodarone IV in the interim. Plan will be for amiodarone and inpatient DCCV. Admitted to Dr. Nelly Laurence. [HN]    Clinical Course User Index [HN] Loetta Rough, MD    Labs: I Ordered, and personally interpreted labs.  The pertinent results include:  those listed above  Additional history obtained from chart review.    Cardiac Monitoring: The patient was maintained on a cardiac monitor.  I personally viewed and interpreted the cardiac monitored which showed an underlying rhythm of: Afib w/ RVR  Social Determinants of Health: Lives independently  Disposition:  Admitted to Dr. Nelly Laurence cardiology  Co morbidities that complicate the patient evaluation  Past Medical History:  Diagnosis Date   Asthma, persistent    BPH (benign prostatic hyperplasia) 2014   CHF (congestive heart failure) (HCC)    Combined hyperlipidemia    Dysrhythmia    ATRIAL FIBRILLATION   GERD (gastroesophageal reflux disease)     History of kidney stones    Hypertension    Lymphocytic colitis 07/2011   MICROSCOPIC   Obesity    Persistent atrial fibrillation (HCC)    Renal stone 04/2012   Seasonal allergic rhinitis    Sleep apnea    does not use a cpap machine     Medicines Meds ordered this encounter  Medications   DISCONTD: lactated ringers bolus 500 mL   FOLLOWED BY Linked Order Group    amiodarone (NEXTERONE) 1.8 mg/mL load via infusion 150 mg    amiodarone (NEXTERONE PREMIX) 360-4.14 MG/200ML-% (1.8 mg/mL) IV infusion    amiodarone (NEXTERONE PREMIX) 360-4.14 MG/200ML-% (1.8 mg/mL) IV infusion    I have reviewed the patients home medicines and have made adjustments as needed  Problem List / ED Course: Problem List Items Addressed This Visit   None Visit Diagnoses     Atrial fibrillation with rapid ventricular response (HCC)    -  Primary   Relevant Medications   amiodarone (NEXTERONE) 1.8 mg/mL load via infusion 150 mg   amiodarone (NEXTERONE PREMIX) 360-4.14 MG/200ML-% (1.8 mg/mL) IV infusion (Start on 10/17/2022 10:30 AM)   amiodarone (NEXTERONE PREMIX) 360-4.14 MG/200ML-% (1.8 mg/mL) IV infusion (Start on 10/17/2022  4:30 PM)   Palpitations                       This note was created using dictation software, which may contain spelling or grammatical errors.    Loetta Rough, MD 10/17/22 1018

## 2022-10-17 NOTE — ED Notes (Signed)
MD at bedside assessing pt.

## 2022-10-17 NOTE — ED Notes (Signed)
ED TO INPATIENT HANDOFF REPORT  ED Nurse Name and Phone #: Osvaldo Shipper RN [phone]  S Name/Age/Gender [name] 67 y.o. male Room/Bed: 010C/010C  Code Status   Code Status: Full Code  Home/SNF/Other Home Patient oriented to: self, place, time, and situation Is this baseline? Yes   Triage Complete: Triage complete  Chief Complaint Atrial fibrillation (HCC) [I48.91]  Triage Note No notes on file   Allergies Allergies  Allergen Reactions   Ibuprofen Other (See Comments)    Has a-Fib, unable to take   Isosorbide Other (See Comments)    HEADACHES from Imdur   Lisinopril Cough   Digoxin And Related Rash   Eplerenone Rash   Spironolactone Rash    Level of Care/Admitting Diagnosis ED Disposition     ED Disposition  Admit   Condition  --   Comment  Hospital Area: [facility] [[id number]]  Level of Care: Telemetry Cardiac [103]  May admit patient to [facility] or [facility] if equivalent level of care is available:: No  Covid Evaluation: Asymptomatic - no recent exposure (last 10 days) testing not required  Diagnosis: Atrial fibrillation (HCC) [427.31.ICD-9-CM]  Admitting Physician: Maurice Small [[id number]]  Attending Physician: Maurice Small [[id number]]  Certification:: I certify this patient is being admitted for an inpatient-only procedure  Expected Medical Readiness: 10/19/2022          B Medical/Surgery History Past Medical History:  Diagnosis Date   Asthma, persistent    BPH (benign prostatic hyperplasia) 2014   CHF (congestive heart failure) (HCC)    Combined hyperlipidemia    Dysrhythmia    ATRIAL FIBRILLATION   GERD (gastroesophageal reflux disease)    History of kidney stones    Hypertension    Lymphocytic colitis 07/2011   MICROSCOPIC   Obesity    Persistent atrial fibrillation (HCC)    Renal stone 04/2012   Seasonal allergic rhinitis    Sleep apnea    does not use a cpap machine   Past Surgical History:   Procedure Laterality Date   ATRIAL FIBRILLATION ABLATION N/A 03/16/2019   Procedure: ATRIAL FIBRILLATION ABLATION;  Surgeon: Hillis Range, MD;  Location: [facility] INVASIVE CV LAB;  Service: Cardiovascular;  Laterality: N/A;   ATRIAL FIBRILLATION ABLATION N/A 06/30/2022   Procedure: ATRIAL FIBRILLATION ABLATION;  Surgeon: Maurice Small, MD;  Location: [facility] INVASIVE CV LAB;  Service: Cardiovascular;  Laterality: N/A;   BOWEL RESECTION N/A 02/07/2022   Procedure: SMALL BOWEL RESECTION;  Surgeon: Berna Bue, MD;  Location: [facility] OR;  Service: General;  Laterality: N/A;   CARDIOVERSION N/A 04/23/2016   Procedure: CARDIOVERSION;  Surgeon: Dolores Patty, MD;  Location: [facility] ENDOSCOPY;  Service: Cardiovascular;  Laterality: N/A;   CARDIOVERSION N/A 01/12/2022   Procedure: CARDIOVERSION;  Surgeon: Jodelle Red, MD;  Location: [facility] ENDOSCOPY;  Service: Cardiovascular;  Laterality: N/A;   CYSTOSCOPY WITH RETROGRADE PYELOGRAM, URETEROSCOPY AND STENT PLACEMENT Left 07/29/2017   Procedure: CYSTOSCOPY WITH RETROGRADE PYELOGRAM AND LEFT STENT PLACEMENT;  Surgeon: Marcine Matar, MD;  Location: [facility] ORS;  Service: Urology;  Laterality: Left;   EXTRACORPOREAL SHOCK WAVE LITHOTRIPSY Left 08/08/2017   Procedure: LEFT EXTRACORPOREAL SHOCK WAVE LITHOTRIPSY (ESWL);  Surgeon: Hildred Laser, MD;  Location: [facility] ORS;  Service: Urology;  Laterality: Left;   LAPAROTOMY N/A 02/07/2022   Procedure: EXPLORATORY LAPAROTOMY;  Surgeon: Berna Bue, MD;  Location: [facility] OR;  Service: General;  Laterality: N/A;   TEE WITHOUT CARDIOVERSION N/A 04/23/2016   Procedure: TRANSESOPHAGEAL ECHOCARDIOGRAM (  TEE);  Surgeon: Dolores Patty, MD;  Location: [facility] ENDOSCOPY;  Service: Cardiovascular;  Laterality: N/A;   VENTRAL HERNIA REPAIR N/A 02/07/2022   Procedure: REPAIR INCARCERATED VENTRAL  HERNIA;  Surgeon: Berna Bue, MD;  Location: [facility] OR;  Service: General;  Laterality: N/A;   XI ROBOTIC ASSISTED SIMPLE  PROSTATECTOMY N/A 12/25/2018   Procedure: XI ROBOTIC ASSISTED SIMPLE PROSTATECTOMY, REMOVAL OF BLADDER STONES;  Surgeon: Malen Gauze, MD;  Location: [facility] ORS;  Service: Urology;  Laterality: N/A;     A IV Location/Drains/Wounds Patient Lines/Drains/Airways Status     Active Line/Drains/Airways     Name Placement date Placement time Site Days   Peripheral IV 10/17/22 20 G Left Antecubital 10/17/22  0937  Antecubital  less than 1            Intake/Output Last 24 hours No intake or output data in the 24 hours ending 10/17/22 1132  Labs/Imaging Results for orders placed or performed during the hospital encounter of 10/17/22 (from the past 48 hour(s))  CBC with Differential     Status: Abnormal   Collection Time: 10/17/22  8:38 AM  Result Value Ref Range   WBC 5.9 4.0 - 10.5 K/uL   RBC 5.03 4.22 - 5.81 MIL/uL   Hemoglobin 11.3 (L) 13.0 - 17.0 g/dL   HCT 16.1 (L) 09.6 - 04.5 %   MCV 73.6 (L) 80.0 - 100.0 fL   MCH 22.5 (L) 26.0 - 34.0 pg   MCHC 30.5 30.0 - 36.0 g/dL   RDW 40.9 (H) 81.1 - 91.4 %   Platelets 376 150 - 400 K/uL   nRBC 0.0 0.0 - 0.2 %   Neutrophils Relative % 66 %   Neutro Abs 3.9 1.7 - 7.7 K/uL   Lymphocytes Relative 20 %   Lymphs Abs 1.2 0.7 - 4.0 K/uL   Monocytes Relative 10 %   Monocytes Absolute 0.6 0.1 - 1.0 K/uL   Eosinophils Relative 3 %   Eosinophils Absolute 0.2 0.0 - 0.5 K/uL   Basophils Relative 1 %   Basophils Absolute 0.0 0.0 - 0.1 K/uL   Immature Granulocytes 0 %   Abs Immature Granulocytes 0.01 0.00 - 0.07 K/uL    Comment: Performed at [facility], [address] [place], [place] [address]  Magnesium     Status: None   Collection Time: 10/17/22  8:38 AM  Result Value Ref Range   Magnesium 2.2 1.7 - 2.4 mg/dL    Comment: Performed at [facility], [address] [place], [place] [address]  Basic metabolic panel     Status: Abnormal   Collection Time: 10/17/22  8:38 AM  Result Value Ref Range   Sodium 138 135 - 145  mmol/L   Potassium 4.0 3.5 - 5.1 mmol/L   Chloride 107 98 - 111 mmol/L   CO2 20 (L) 22 - 32 mmol/L   Glucose, Bld 88 70 - 99 mg/dL    Comment: Glucose reference range applies only to samples taken after fasting for at least 8 hours.   BUN 19 8 - 23 mg/dL   Creatinine, Ser 8.65 (H) 0.61 - 1.24 mg/dL   Calcium 8.9 8.9 - 78.4 mg/dL   GFR, Estimated 40 (L) >60 mL/min    Comment: (NOTE) Calculated using the CKD-EPI Creatinine Equation (2021)    Anion gap 11 5 - 15    Comment: Performed at [facility], [address] [place], [place] [address]   No  results found.  Pending Labs Unresulted Labs (From admission, onward)     Start     Ordered   10/18/22 0500  Basic metabolic panel  Tomorrow morning,   R        10/17/22 1020   10/18/22 0500  CBC  Tomorrow morning,   R        10/17/22 1020            Vitals/Pain Today's Vitals   10/17/22 1015 10/17/22 1030 10/17/22 1045 10/17/22 1102  BP: 122/88 (!) 131/91 118/83 (!) 123/93  Pulse: 77 91 (!) 123 (!) 104  Resp: 11 20 16 16   Temp:      TempSrc:      SpO2: 98% 99% 96% 96%  PainSc:    0-No pain    Isolation Precautions No active isolations  Medications Medications  amiodarone (NEXTERONE) 1.8 mg/mL load via infusion 150 mg (150 mg Intravenous Bolus from Bag 10/17/22 1052)    Followed by  amiodarone (NEXTERONE PREMIX) 360-4.14 MG/200ML-% (1.8 mg/mL) IV infusion (60 mg/hr Intravenous New Bag/Given 10/17/22 1105)    Followed by  amiodarone (NEXTERONE PREMIX) 360-4.14 MG/200ML-% (1.8 mg/mL) IV infusion (has no administration in time range)  acetaminophen (TYLENOL) tablet 650 mg (has no administration in time range)  apixaban (ELIQUIS) tablet 5 mg (has no administration in time range)  amLODipine (NORVASC) tablet 10 mg (has no administration in time range)  pantoprazole (PROTONIX) EC tablet 40 mg (has no administration in time range)  sacubitril-valsartan (ENTRESTO) 97-103 mg per tablet (has no administration in time range)   mometasone-formoterol (DULERA) 200-5 MCG/ACT inhaler 2 puff (has no administration in time range)    Mobility walks     Focused Assessments Cardiac Assessment Handoff:  Cardiac Rhythm: Atrial fibrillation Lab Results  Component Value Date   TROPONINI 0.06 (HH) 03/31/2016   No results found for: "DDIMER" Does the Patient currently have chest pain? No    R Recommendations: See Admitting Provider Note  Report given to:   Additional Notes: Cardioversion planned for 10/18/2022, NPO at midnight

## 2022-10-18 ENCOUNTER — Inpatient Hospital Stay (HOSPITAL_COMMUNITY): Payer: Medicare HMO | Admitting: Anesthesiology

## 2022-10-18 ENCOUNTER — Encounter (HOSPITAL_COMMUNITY)
Admission: EM | Disposition: A | Discharge status: Home / Self Care | Payer: Self-pay | Source: Home / Self Care | Attending: Emergency Medicine

## 2022-10-18 ENCOUNTER — Other Ambulatory Visit (HOSPITAL_COMMUNITY): Payer: Self-pay

## 2022-10-18 DIAGNOSIS — I4819 Other persistent atrial fibrillation: Secondary | ICD-10-CM | POA: Diagnosis not present

## 2022-10-18 DIAGNOSIS — I110 Hypertensive heart disease with heart failure: Secondary | ICD-10-CM | POA: Diagnosis not present

## 2022-10-18 DIAGNOSIS — G4733 Obstructive sleep apnea (adult) (pediatric): Secondary | ICD-10-CM

## 2022-10-18 DIAGNOSIS — I5043 Acute on chronic combined systolic (congestive) and diastolic (congestive) heart failure: Secondary | ICD-10-CM

## 2022-10-18 DIAGNOSIS — I4891 Unspecified atrial fibrillation: Secondary | ICD-10-CM

## 2022-10-18 HISTORY — PX: CARDIOVERSION: SHX1299

## 2022-10-18 LAB — BASIC METABOLIC PANEL
Anion gap: 8 (ref 5–15)
BUN: 17 mg/dL (ref 8–23)
CO2: 24 mmol/L (ref 22–32)
Calcium: 8.6 mg/dL — ABNORMAL LOW (ref 8.9–10.3)
Chloride: 107 mmol/L (ref 98–111)
Creatinine, Ser: 1.64 mg/dL — ABNORMAL HIGH (ref 0.61–1.24)
GFR, Estimated: 46 mL/min — ABNORMAL LOW (ref >60)
Glucose, Bld: 98 mg/dL (ref 70–99)
Potassium: 3.7 mmol/L (ref 3.5–5.1)
Sodium: 139 mmol/L (ref 135–145)

## 2022-10-18 LAB — CBC
HCT: 34.1 % — ABNORMAL LOW (ref 39.0–52.0)
Hemoglobin: 10.6 g/dL — ABNORMAL LOW (ref 13.0–17.0)
MCH: 23.2 pg — ABNORMAL LOW (ref 26.0–34.0)
MCHC: 31.1 g/dL (ref 30.0–36.0)
MCV: 74.6 fL — ABNORMAL LOW (ref 80.0–100.0)
Platelets: 345 10*3/uL (ref 150–400)
RBC: 4.57 MIL/uL (ref 4.22–5.81)
RDW: 17.1 % — ABNORMAL HIGH (ref 11.5–15.5)
WBC: 5.9 10*3/uL (ref 4.0–10.5)
nRBC: 0 % (ref 0.0–0.2)

## 2022-10-18 LAB — MRSA NEXT GEN BY PCR, NASAL: MRSA by PCR Next Gen: NOT DETECTED

## 2022-10-18 SURGERY — CARDIOVERSION
Anesthesia: General

## 2022-10-18 MED ORDER — AMIODARONE HCL 200 MG PO TABS
ORAL_TABLET | ORAL | 0 refills | Status: DC
  Filled 2022-10-18: qty 90, 70d supply, fill #0

## 2022-10-18 MED ORDER — AMIODARONE HCL 200 MG PO TABS
ORAL_TABLET | ORAL | 0 refills | Status: DC
  Filled 2022-10-18: qty 80, 70d supply, fill #0

## 2022-10-18 MED ORDER — AMIODARONE HCL 200 MG PO TABS
400.0000 mg | ORAL_TABLET | Freq: Once | ORAL | Status: AC
  Administered 2022-10-18: 400 mg via ORAL
  Filled 2022-10-18: qty 2

## 2022-10-18 MED ORDER — PROPOFOL 10 MG/ML IV BOLUS
INTRAVENOUS | Status: DC | PRN
  Administered 2022-10-18: 60 mg via INTRAVENOUS

## 2022-10-18 MED ORDER — LIDOCAINE 2% (20 MG/ML) 5 ML SYRINGE
INTRAMUSCULAR | Status: DC | PRN
  Administered 2022-10-18: 60 mg via INTRAVENOUS

## 2022-10-18 SURGICAL SUPPLY — 1 items: ELECT DEFIB PAD ADLT CADENCE (PAD) ×1 IMPLANT

## 2022-10-18 NOTE — Progress Notes (Signed)
Back from the cath lab by bed awake and alert.

## 2022-10-18 NOTE — Interval H&P Note (Signed)
History and Physical Interval Note:  10/18/2022 10:29 AM  [name]  has presented today for surgery, with the diagnosis of afib.  The various methods of treatment have been discussed with the patient and family. After consideration of risks, benefits and other options for treatment, the patient has consented to  Procedure(s): CARDIOVERSION (N/A) as a surgical intervention.  The patient's history has been reviewed, patient examined, no change in status, stable for surgery.  I have reviewed the patient's chart and labs.  Questions were answered to the patient's satisfaction.     [employer]

## 2022-10-18 NOTE — Care Management Obs Status (Addendum)
MEDICARE OBSERVATION STATUS NOTIFICATION   Patient Details  Name: [name] MRN: [id number] Date of Birth: Apr 28, 1955   Medicare Observation Status Notification Given:  Yes Spoke with patient over the phone   Leone Haven, RN 10/18/2022, 2:41 PM

## 2022-10-18 NOTE — Progress Notes (Signed)
  Patient Name: [name] Date of Encounter: 10/18/2022  Primary Cardiologist: Charlton Haws, MD Electrophysiologist: Maurice Small, MD  Interval Summary   The patient is doing well today.  At this time, the patient denies chest pain, shortness of breath, or any new concerns.  Vital Signs    Vitals:   10/18/22 0016 10/18/22 0254 10/18/22 0741 10/18/22 0803  BP: 99/66 (!) 143/86  (!) 131/92  Pulse: 93   95  Resp: 19 19    Temp: 98.1 F (36.7 C) 98.2 F (36.8 C)  98.9 F (37.2 C)  TempSrc: Oral Oral  Oral  SpO2: 99% 98% 98%     Intake/Output Summary (Last 24 hours) at 10/18/2022 0809 Last data filed at 10/18/2022 1610 Gross per 24 hour  Intake 509.55 ml  Output 575 ml  Net -65.45 ml   There were no vitals filed for this visit.  Physical Exam    GEN- The patient is well appearing, alert and oriented x 3 today.   Lungs- Clear to ausculation bilaterally, normal work of breathing Cardiac- Irregularly irregular rate and rhythm, no murmurs, rubs or gallops GI- soft, NT, ND, + BS Extremities- no clubbing or cyanosis. No edema  Telemetry    AF 110-120s (personally reviewed)  Hospital Course    [name] is a 67 y.o. male with history of persistent atrial fibrillation, tachycardia mediated CM, CKD, hypertension, CAD, OSA, CKD IIIb who is being seen 10/17/2022 for the evaluation of recurrent atrial fibrillation.   Assessment & Plan    Persistent atrial fibrillation History of ablation x2 Qtc has been prolonged historically, no option for class III, class I contraindicated by cardiomyopathy Continue IV amiodarone through cardioversion. Then will taper for reload.  He reports he has not missed a dose of anticoagulation in the past 2 weeks; Plan for [facility] this afternoon Tentatively home after.    CHFrEF EF 45-50%, attributable to tachycardia Continue entresto Jardiance held for planned anesthesia procedure   Hypertension Continue entresto, amlodipine    OSA Will need follow-up with sleep medicine for optimal treatment of OSA  Dr. Jimmey Ralph has seen.  [facility] later this am then tentatively home.   For questions or updates, please contact [facility] Please consult [email or web address] for contact info under Cardiology/STEMI.  Signed, Graciella Freer, PA-C  10/18/2022, 8:09 AM

## 2022-10-18 NOTE — Care Management CC44 (Signed)
Condition Code 44 Documentation Completed  Patient Details  Name: [name] MRN: [id number] Date of Birth: 03-19-1955   Condition Code 44 given:  Yes Patient signature on Condition Code 44 notice:  Yes Documentation of 2 MD's agreement:  Yes Code 44 added to claim:  Yes    Leone Haven, RN 10/18/2022, 2:41 PM

## 2022-10-18 NOTE — CV Procedure (Signed)
Electrical Cardioversion Procedure Note RONITH RAPER [id number] 29-Aug-1955  Procedure: Electrical Cardioversion Indications:  Atrial Fibrillation  Time Out: Verified patient identification, verified procedure,medications/allergies/relevent history reviewed, required imaging and test results available.  Performed  Procedure Details  The patient was NPO after midnight. Anesthesia was administered at the beside  by Dr.Rose with propofol.  Cardioversion was performed with synchronized biphasic defibrillation via AP pads with 200 joules.  1 attempt(s) were performed.  The patient converted to normal sinus rhythm. The patient tolerated the procedure well   IMPRESSION:  Successful DC cardioversion of atrial fibrillation with IV amiodarone in place as well.     Donato Schultz 10/18/2022, 10:42 AM

## 2022-10-18 NOTE — Plan of Care (Signed)

## 2022-10-18 NOTE — Discharge Summary (Signed)
ELECTROPHYSIOLOGY DISCHARGE SUMMARY    Patient ID: [name],  MRN: [id number], DOB/AGE: September 29, 1955 67 y.o.  Admit date: 10/17/2022 Discharge date: 10/18/2022  Primary Care Physician: Kirby Funk, MD (Inactive)  Primary Cardiologist: Charlton Haws, MD  Electrophysiologist: Dr. Nelly Laurence   Primary Discharge Diagnosis:  Persistent atrial fibrillation  Secondary Discharge Diagnosis:  HTN OSA HFrEF  Procedures This Admission:  Cardioversion 9/23 by Dr. Anne Fu which showed:  Successful DC cardioversion of atrial fibrillation with IV amiodarone in place as well.   Brief HPI: [name] is a 67 y.o. male with a history of persistent atrial fibrillation, tachycardia mediated CM, CKD, hypertension, CAD, OSA, CKD IIIb who is being seen 10/17/2022 for the evaluation of recurrent atrial fibrillation.   Hospital Course:  The patient was admitted with recurrent AF by [facility]. Was relatively asymptomatic , but due to his complicated history with multiple ablations presented for evaluation. Reloaded on IV amiodarone, which had been stopped after redo ablation in June of this year. They were monitored on telemetry overnight which demonstrated AF with variable rate control.   Pt underwent direct current cardioversion as above and was stable post op. He was transitioned to po amiodarone with a taper.   The patient was examined and considered to be stable for discharge.  The patient will be seen back by [facility] in 1-2 weeks for post hospital care.   Pt has previously had sinus bradycardia on higher doses of amiodarone, so will taper to 200 mg daily as below with close follow up.    Physical Exam: Vitals:   10/18/22 1047 10/18/22 1055 10/18/22 1100 10/18/22 1207  BP: 104/78  105/78 125/86  Pulse: 62  (!) 55 61  Resp: 14  12   Temp:  (!) 96.2 F (35.7 C)  97.6 F (36.4 C)  TempSrc:  Temporal  Oral  SpO2: 97%  96% 96%    GEN- NAD. A&O x 3.  HEENT: Normocephalic,  atraumatic Lungs- CTAB, Normal effort.  Heart- RRR, No M/G/R.  GI- Soft, NT, ND.  Extremities- No clubbing, cyanosis, or edema; Groin without hematoma   Discharge Medications:  Allergies as of 10/18/2022       Reactions   Ibuprofen Other (See Comments)   Has a-Fib, unable to take   Isosorbide Other (See Comments)   HEADACHES from Imdur   Lisinopril Cough   Digoxin And Related Rash   Eplerenone Rash   Spironolactone Rash        Medication List     STOP taking these medications    eplerenone 25 MG tablet Commonly known as: INSPRA       TAKE these medications    acetaminophen 325 MG tablet Commonly known as: TYLENOL Take 2 tablets (650 mg total) by mouth every 6 (six) hours as needed for mild pain or headache. What changed: when to take this   Advair Diskus 250-50 MCG/ACT Aepb Generic drug: fluticasone-salmeterol Inhale 1 puff into the lungs in the morning and at bedtime.   albuterol 108 (90 Base) MCG/ACT inhaler Commonly known as: VENTOLIN HFA Inhale 2 puffs into the lungs every 6 (six) hours as needed for wheezing or shortness of breath.   amiodarone 200 MG tablet Commonly known as: PACERONE Take 2 tablets (400 mg total) by mouth daily for 5 days, THEN 1 tablet (200 mg total) 2 (two) times daily for 5 days, THEN 1 tablet (200 mg total) daily. Start taking on: October 18, 2022 What changed:  medication  strength See the new instructions.   amLODipine 10 MG tablet Commonly known as: NORVASC Take 1 tablet (10 mg total) by mouth daily.   Eliquis 5 MG Tabs tablet Generic drug: apixaban TAKE 1 TABLET BY MOUTH TWICE A DAY   empagliflozin 10 MG Tabs tablet Commonly known as: Jardiance Take 1 tablet (10 mg total) by mouth daily before breakfast.   Entresto 97-103 MG Generic drug: sacubitril-valsartan Take 1 tablet by mouth 2 (two) times daily. NEEDS FOLLOW UP APPOINTMENT FOR MORE REFILLS   furosemide 40 MG tablet Commonly known as: LASIX Take 40 mg by  mouth 2 (two) times a week.   pantoprazole 40 MG tablet Commonly known as: PROTONIX Take 40 mg by mouth daily.   rosuvastatin 5 MG tablet Commonly known as: CRESTOR 1 tablet Orally Mon-Weds-Fri for 90 days   tadalafil 5 MG tablet Commonly known as: CIALIS Take 5 mg by mouth daily.        Disposition: Home with usual follow up as in AVS  Duration of Discharge Encounter: Greater than 30 minutes including physician time.  Dustin Flock, PA-C  10/18/2022 12:54 PM

## 2022-10-18 NOTE — Anesthesia Preprocedure Evaluation (Signed)
Anesthesia Evaluation  Patient identified by MRN, date of birth, ID band Patient awake    Reviewed: Allergy & Precautions, H&P , NPO status , Patient's Chart, lab work & pertinent test results  Airway Mallampati: II  TM Distance: <3 FB Neck ROM: Full    Dental no notable dental hx.    Pulmonary sleep apnea    Pulmonary exam normal breath sounds clear to auscultation       Cardiovascular hypertension, +CHF  Normal cardiovascular exam+ dysrhythmias Atrial Fibrillation  Rhythm:Irregular Rate:Normal  1. Left ventricular ejection fraction, by estimation, is 45 to 50%. The  left ventricle has mildly decreased function. The left ventricle  demonstrates global hypokinesis. The left ventricular internal cavity size  was severely dilated. Left ventricular  diastolic parameters are consistent with Grade I diastolic dysfunction  (impaired relaxation).   2. Right ventricular systolic function is normal. The right ventricular  size is normal. There is normal pulmonary artery systolic pressure.   3. Left atrial size was moderately dilated.   4. The mitral valve is normal in structure. Mild, eccentric, mitral valve  regurgitation. No evidence of mitral stenosis.   5. The aortic valve is tricuspid. Aortic valve regurgitation is not  visualized. No aortic stenosis is present.   6. The inferior vena cava is normal in size with greater than 50%  respiratory variability, suggesting right atrial pressure of 3 mmHg.     Neuro/Psych negative neurological ROS  negative psych ROS   GI/Hepatic Neg liver ROS,GERD  ,,  Endo/Other  negative endocrine ROS    Renal/GU negative Renal ROS  negative genitourinary   Musculoskeletal negative musculoskeletal ROS (+)    Abdominal   Peds negative pediatric ROS (+)  Hematology negative hematology ROS (+)   Anesthesia Other Findings   Reproductive/Obstetrics negative OB ROS                              Anesthesia Physical Anesthesia Plan  ASA: 3  Anesthesia Plan: General   Post-op Pain Management: Minimal or no pain anticipated   Induction: Intravenous  PONV Risk Score and Plan: 2 and Treatment may vary due to age or medical condition  Airway Management Planned: Mask and Nasal Cannula  Additional Equipment:   Intra-op Plan:   Post-operative Plan:   Informed Consent: I have reviewed the patients History and Physical, chart, labs and discussed the procedure including the risks, benefits and alternatives for the proposed anesthesia with the patient or authorized representative who has indicated his/her understanding and acceptance.     Dental advisory given  Plan Discussed with: CRNA and Surgeon  Anesthesia Plan Comments:        Anesthesia Quick Evaluation

## 2022-10-18 NOTE — Progress Notes (Signed)
Transported to the cath lab for cardioversion by chair.awake and alert.

## 2022-10-18 NOTE — Progress Notes (Signed)
Discharged home accompanied by son. Belongings with the pt. To pick up meds from [facility] pharmacy.

## 2022-10-18 NOTE — Anesthesia Postprocedure Evaluation (Signed)
Anesthesia Post Note  Patient: [name]  Procedure(s) Performed: CARDIOVERSION     Patient location during evaluation: PACU Anesthesia Type: General Level of consciousness: awake and alert Pain management: pain level controlled Vital Signs Assessment: post-procedure vital signs reviewed and stable Respiratory status: spontaneous breathing, nonlabored ventilation, respiratory function stable and patient connected to nasal cannula oxygen Cardiovascular status: blood pressure returned to baseline and stable Postop Assessment: no apparent nausea or vomiting Anesthetic complications: no  No notable events documented.  Last Vitals:  Vitals:   10/18/22 1100 10/18/22 1207  BP: 105/78 125/86  Pulse: (!) 55 61  Resp: 12   Temp:  36.4 C  SpO2: 96% 96%    Last Pain:  Vitals:   10/18/22 1207  TempSrc: Oral  PainSc: 0-No pain                 Terralyn Matsumura S

## 2022-10-18 NOTE — TOC Transition Note (Signed)
Transition of Care [facility]) - CM/SW Discharge Note   Patient Details  Name: [name] MRN: [id number] Date of Birth: 1955/04/01  [facility]) CM/SW Contact:  Leone Haven, RN Phone Number: 10/18/2022, 1:20 PM   Clinical Narrative:    For dc today. TOC to fill meds. Son will transport him home, he has no needs.         Patient Goals and CMS Choice      Discharge Placement                         Discharge Plan and Services Additional resources added to the After Visit Summary for                                       Social Determinants of Health (SDOH) Interventions SDOH Screenings   Food Insecurity: No Food Insecurity (10/18/2022)  Housing: Low Risk  (10/18/2022)  Transportation Needs: No Transportation Needs (10/18/2022)  Utilities: Not At Risk (10/18/2022)  Tobacco Use: Low Risk  (10/17/2022)     Readmission Risk Interventions     No data to display

## 2022-10-18 NOTE — Transfer of Care (Signed)
Immediate Anesthesia Transfer of Care Note  Patient: [name]  Procedure(s) Performed: CARDIOVERSION  Patient Location: PACU and Cath Lab  Anesthesia Type:General  Level of Consciousness: sedated  Airway & Oxygen Therapy: Patient Spontanous Breathing and Patient connected to nasal cannula oxygen  Post-op Assessment: Report given to RN and Post -op Vital signs reviewed and stable  Post vital signs: Reviewed and stable  Last Vitals:  Vitals Value Taken Time  BP    Temp    Pulse    Resp    SpO2      Last Pain:  Vitals:   10/18/22 0958  TempSrc: Temporal  PainSc:          Complications: No notable events documented.

## 2022-10-18 NOTE — TOC Initial Note (Addendum)
Transition of Care [facility]) - Initial/Assessment Note    Patient Details  Name: [name] MRN: [id number] Date of Birth: 1955-02-01  Transition of Care [facility]) CM/SW Contact:    Leone Haven, RN Phone Number: 10/18/2022, 9:55 AM  Clinical Narrative:                 From home , went to cath lab for cardioversion today.  From home with spouse, has PCP and insurance on file, states has no HH services in place at this time or DME at home.  States son will transport him  home at [employer] and family is support system, states [facility] in [place].  Pta self ambulatory.          Patient Goals and CMS Choice            Expected Discharge Plan and Services                                              Prior Living Arrangements/Services                       Activities of Daily Living Home Assistive Devices/Equipment: None ADL Screening (condition at time of admission) Patient's cognitive ability adequate to safely complete daily activities?: Yes Is the patient deaf or have difficulty hearing?: No Does the patient have difficulty seeing, even when wearing glasses/contacts?: No Does the patient have difficulty concentrating, remembering, or making decisions?: No Patient able to express need for assistance with ADLs?: Yes Does the patient have difficulty dressing or bathing?: No Independently performs ADLs?: Yes (appropriate for developmental age) Does the patient have difficulty walking or climbing stairs?: No Weakness of Legs: None Weakness of Arms/Hands: None  Permission Sought/Granted                  Emotional Assessment              Admission diagnosis:  Atrial fibrillation (HCC) [I48.91] Palpitations [R00.2] Atrial fibrillation with rapid ventricular response (HCC) [I48.91] Patient Active Problem List   Diagnosis Date Noted   Atrial fibrillation (HCC) 10/17/2022   PAF (paroxysmal atrial fibrillation) (HCC)  02/09/2022   Strangulated incisional hernia s/p SB resction & primary repair 02/08/2022 02/08/2022   AKI (acute kidney injury) (HCC) 02/08/2022   Incarcerated hernia 02/07/2022   Aspiration pneumonia (HCC) 02/07/2022   Chronic combined systolic and diastolic heart failure (HCC) 02/07/2022   Primary hypertension 02/07/2022   Hyperlipidemia 02/07/2022   OSA (obstructive sleep apnea) 02/07/2022   Lymphocytic colitis 02/07/2022   Persistent atrial fibrillation (HCC) 04/16/2019   Hypercoagulable state due to persistent atrial fibrillation (HCC) 04/16/2019   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 AT [facility] [address] [place] [address] Phone: [phone] Fax: [phone]  [facility] [address] [place] [address] Phone: [phone] Fax: [phone]     Social Determinants of Health (SDOH) Social History: SDOH Screenings   Food Insecurity: No Food Insecurity (10/18/2022)  Housing: Low Risk  (10/18/2022)  Transportation Needs: No Transportation Needs (10/18/2022)  Utilities: Not At Risk (10/18/2022)  Tobacco Use: Low Risk  (  10/17/2022)   SDOH Interventions:     Readmission Risk Interventions     No data to display

## 2022-10-19 ENCOUNTER — Encounter (HOSPITAL_COMMUNITY): Payer: Self-pay | Admitting: Cardiology

## 2022-10-20 ENCOUNTER — Other Ambulatory Visit: Payer: Self-pay | Admitting: Cardiovascular Disease

## 2022-10-26 ENCOUNTER — Encounter (HOSPITAL_COMMUNITY): Payer: Medicare HMO | Admitting: Physician Assistant

## 2022-11-02 ENCOUNTER — Encounter (HOSPITAL_COMMUNITY): Payer: Self-pay

## 2022-11-02 ENCOUNTER — Encounter (HOSPITAL_COMMUNITY): Payer: Medicare HMO | Admitting: Physician Assistant

## 2022-11-10 DIAGNOSIS — I480 Paroxysmal atrial fibrillation: Secondary | ICD-10-CM | POA: Diagnosis not present

## 2022-11-10 DIAGNOSIS — E785 Hyperlipidemia, unspecified: Secondary | ICD-10-CM | POA: Diagnosis not present

## 2022-11-10 DIAGNOSIS — I130 Hypertensive heart and chronic kidney disease with heart failure and stage 1 through stage 4 chronic kidney disease, or unspecified chronic kidney disease: Secondary | ICD-10-CM | POA: Diagnosis not present

## 2022-11-10 DIAGNOSIS — N183 Chronic kidney disease, stage 3 unspecified: Secondary | ICD-10-CM | POA: Diagnosis not present

## 2022-11-10 DIAGNOSIS — I5032 Chronic diastolic (congestive) heart failure: Secondary | ICD-10-CM | POA: Diagnosis not present

## 2022-11-10 DIAGNOSIS — R053 Chronic cough: Secondary | ICD-10-CM | POA: Diagnosis not present

## 2022-11-22 ENCOUNTER — Encounter: Payer: Self-pay | Admitting: Cardiovascular Disease

## 2022-11-22 ENCOUNTER — Ambulatory Visit: Payer: Medicare HMO | Attending: Cardiovascular Disease | Admitting: Cardiovascular Disease

## 2022-11-22 VITALS — BP 116/80 | HR 58 | Ht 72.0 in | Wt 245.2 lb

## 2022-11-22 DIAGNOSIS — I43 Cardiomyopathy in diseases classified elsewhere: Secondary | ICD-10-CM

## 2022-11-22 DIAGNOSIS — I4819 Other persistent atrial fibrillation: Secondary | ICD-10-CM

## 2022-11-22 DIAGNOSIS — R000 Tachycardia, unspecified: Secondary | ICD-10-CM

## 2022-11-22 DIAGNOSIS — I5022 Chronic systolic (congestive) heart failure: Secondary | ICD-10-CM

## 2022-11-22 DIAGNOSIS — I480 Paroxysmal atrial fibrillation: Secondary | ICD-10-CM | POA: Diagnosis not present

## 2022-11-22 NOTE — Patient Instructions (Signed)
Medication Instructions:  Your physician recommends that you continue on your current medications as directed. Please refer to the Current Medication list given to you today.  *If you need a refill on your cardiac medications before your next appointment, please call your pharmacy*   Lab Work: CMET and TSH today If you have labs (blood work) drawn today and your tests are completely normal, you will receive your results only by: MyChart Message (if you have MyChart) OR A paper copy in the mail If you have any lab test that is abnormal or we need to change your treatment, we will call you to review the results.   Follow-Up: At [facility], you and your health needs are our priority.  As part of our continuing mission to provide you with exceptional heart care, we have created designated Provider Care Teams.  These Care Teams include your primary Cardiologist (physician) and Advanced Practice Providers (APPs -  Physician Assistants and Nurse Practitioners) who all work together to provide you with the care you need, when you need it.  We recommend signing up for the patient portal called "MyChart".  Sign up information is provided on this After Visit Summary.  MyChart is used to connect with patients for Virtual Visits (Telemedicine).  Patients are able to view lab/test results, encounter notes, upcoming appointments, etc.  Non-urgent messages can be sent to your provider as well.   To learn more about what you can do with MyChart, go to [email or web address].    Your next appointment:   6 month(s)  Provider:   York Pellant, MD

## 2022-11-22 NOTE — Progress Notes (Signed)
Electrophysiology Office Note:    Date:  11/22/2022   ID:  [name], DOB [birth year], MRN [id number]  PCP:  Kirby Funk, MD (Inactive)    HeartCare Providers Cardiologist:  Charlton Haws, MD Electrophysiologist:  Maurice Small, MD  Advanced Heart Failure:  Arvilla Meres, MD  Sleep Medicine:  Armanda Magic, MD     Referring MD: No ref. provider found   History of Present Illness:    [name] is a 67 y.o. male with a hx listed below, significant for atrial fibrillation, CHF, referred for arrhythmia management.  He underwent A-fib ablation by Dr. Johney Frame in February 2021.  Per his recollection, he remained in sinus rhythm for at least a year and did reasonably well on amiodarone. He did have recurrence and had a DCCV in December, 2023.   He underwent a second ablation by me in June 2024.  At that time the left nares veins were blocked, but the right veins had reconnected.  The right veins were reisolated and the posterior wall also ablated.  Unfortunately, he had recurrence of atrial fibrillation in September 2024.  Amiodarone was started as he has contraindications to class III and class I and treatment drugs.  Cardioversion was successful       EKGs/Labs/Other Studies Reviewed Today:      EKG:   EKG Interpretation Date/Time:  Monday November 22 2022 14:54:22 EDT Ventricular Rate:  58 PR Interval:  180 QRS Duration:  146 QT Interval:  460 QTC Calculation: 451 R Axis:   89  Text Interpretation: Sinus bradycardia Right bundle branch block When compared with ECG of 17-Oct-2022 08:43, sinus rhythm has replaced atrial fibrillation Confirmed by York Pellant [phone]) on 11/22/2022 3:08:56 PM     Recent Labs: 02/11/2022: TSH 0.232 02/12/2022: ALT 10 10/07/2022: B Natriuretic Peptide 121.0 10/17/2022: Magnesium 2.2 10/18/2022: BUN 17; Creatinine, Ser 1.64; Hemoglobin 10.6; Platelets 345; Potassium 3.7; Sodium 139     Physical Exam:    VS:  BP  116/80 (BP Location: Left Arm, Patient Position: Sitting, Cuff Size: Large)   Pulse (!) 58   Ht 6' (1.829 m)   Wt 245 lb 3.2 oz (111.2 kg)   SpO2 98%   BMI 33.26 kg/m     Wt Readings from Last 3 Encounters:  11/22/22 245 lb 3.2 oz (111.2 kg)  10/07/22 249 lb 9.6 oz (113.2 kg)  07/28/22 244 lb 3.2 oz (110.8 kg)     GEN:  Well nourished, well developed in no acute distress CARDIAC: RRR, no murmurs, rubs, gallops RESPIRATORY:  Normal work of breathing MUSCULOSKELETAL: no edema    ASSESSMENT & PLAN:    Atrial fibrillation:  persistent.  Failed amiodarone prior to second ablation.  Status post ablation by Dr. Johney Frame in February 2021 and repeat ablation by me June 30, 2022 Has a history of sinus bradycardia precluding aggressive rate control Now with recurrence after second ablation and back on amiodarone Check TSH, CMP I think repeat mapping is reasonable -- particularly now with PFA  Inguinal hernia -- should be well recovered by the time of ablation in April/May  Secondary hypercoagulable state: continue eliquis 5  OSA: needs to use CPAP  Tachycardia mediated cardiomyopathy:  needs to maintain sinus rhythm         Medication Adjustments/Labs and Tests Ordered: Current medicines are reviewed at length with the patient today.  Concerns regarding medicines are outlined above.  Orders Placed This Encounter  Procedures   EKG 12-Lead  No orders of the defined types were placed in this encounter.    Signed, Maurice Small, MD  11/22/2022 3:09 PM    [facility]

## 2022-11-23 LAB — COMPREHENSIVE METABOLIC PANEL
ALT: 15 [IU]/L (ref 0–44)
AST: 18 [IU]/L (ref 0–40)
Albumin: 4.1 g/dL (ref 3.9–4.9)
Alkaline Phosphatase: 90 [IU]/L (ref 44–121)
BUN/Creatinine Ratio: 15 (ref 10–24)
BUN: 28 mg/dL — ABNORMAL HIGH (ref 8–27)
Bilirubin Total: 0.5 mg/dL (ref 0.0–1.2)
CO2: 19 mmol/L — ABNORMAL LOW (ref 20–29)
Calcium: 9.2 mg/dL (ref 8.6–10.2)
Chloride: 106 mmol/L (ref 96–106)
Creatinine, Ser: 1.83 mg/dL — ABNORMAL HIGH (ref 0.76–1.27)
Globulin, Total: 2.7 g/dL (ref 1.5–4.5)
Glucose: 73 mg/dL (ref 70–99)
Potassium: 4.5 mmol/L (ref 3.5–5.2)
Sodium: 140 mmol/L (ref 134–144)
Total Protein: 6.8 g/dL (ref 6.0–8.5)
eGFR: 40 mL/min/{1.73_m2} — ABNORMAL LOW (ref >59)

## 2022-11-23 LAB — TSH RFX ON ABNORMAL TO FREE T4: TSH: 0.528 u[IU]/mL (ref 0.450–4.500)

## 2022-12-02 ENCOUNTER — Institutional Professional Consult (permissible substitution): Payer: Medicare HMO | Admitting: Pulmonary Disease

## 2022-12-08 DIAGNOSIS — I480 Paroxysmal atrial fibrillation: Secondary | ICD-10-CM | POA: Diagnosis not present

## 2022-12-08 DIAGNOSIS — E785 Hyperlipidemia, unspecified: Secondary | ICD-10-CM | POA: Diagnosis not present

## 2022-12-08 DIAGNOSIS — I129 Hypertensive chronic kidney disease with stage 1 through stage 4 chronic kidney disease, or unspecified chronic kidney disease: Secondary | ICD-10-CM | POA: Diagnosis not present

## 2022-12-08 DIAGNOSIS — N183 Chronic kidney disease, stage 3 unspecified: Secondary | ICD-10-CM | POA: Diagnosis not present

## 2022-12-24 ENCOUNTER — Other Ambulatory Visit (HOSPITAL_COMMUNITY): Payer: Self-pay | Admitting: Internal Medicine

## 2023-01-14 ENCOUNTER — Other Ambulatory Visit (HOSPITAL_COMMUNITY): Payer: Self-pay | Admitting: Internal Medicine

## 2023-02-09 ENCOUNTER — Ambulatory Visit: Payer: Medicare HMO | Attending: Cardiology | Admitting: Cardiology

## 2023-02-09 ENCOUNTER — Encounter: Payer: Self-pay | Admitting: Cardiology

## 2023-02-09 VITALS — BP 142/78 | HR 58 | Ht 72.0 in | Wt 249.4 lb

## 2023-02-09 DIAGNOSIS — I10 Essential (primary) hypertension: Secondary | ICD-10-CM | POA: Diagnosis not present

## 2023-02-09 DIAGNOSIS — G4733 Obstructive sleep apnea (adult) (pediatric): Secondary | ICD-10-CM

## 2023-02-09 NOTE — Patient Instructions (Signed)
 Medication Instructions:  Your physician recommends that you continue on your current medications as directed. Please refer to the Current Medication list given to you today.  *If you need a refill on your cardiac medications before your next appointment, please call your pharmacy*   Lab Work: None.  If you have labs (blood work) drawn today and your tests are completely normal, you will receive your results only by: MyChart Message (if you have MyChart) OR A paper copy in the mail If you have any lab test that is abnormal or we need to change your treatment, we will call you to review the results.   Testing/Procedures: Your physician has recommended that you have a split night sleep study. This test records several body functions during sleep, including: brain activity, eye movement, oxygen  and carbon dioxide blood levels, heart rate and rhythm, breathing rate and rhythm, the flow of air through your mouth and nose, snoring, body muscle movements, and chest and belly movement.    Follow-Up: At [facility], you and your health needs are our priority.  As part of our continuing mission to provide you with exceptional heart care, we have created designated Provider Care Teams.  These Care Teams include your primary Cardiologist (physician) and Advanced Practice Providers (APPs -  Physician Assistants and Nurse Practitioners) who all work together to provide you with the care you need, when you need it.  We recommend signing up for the patient portal called "MyChart".  Sign up information is provided on this After Visit Summary.  MyChart is used to connect with patients for Virtual Visits (Telemedicine).  Patients are able to view lab/test results, encounter notes, upcoming appointments, etc.  Non-urgent messages can be sent to your provider as well.   To learn more about what you can do with MyChart, go to [email or web address].    Your next appointment will be dependent on the  results of your testing and it will be with:     Provider:   Dr. Gaylyn Keas, MD

## 2023-02-09 NOTE — Progress Notes (Signed)
 Sleep office Note:    Date:  02/09/2023   ID:  [name], DOB 1955/03/23, MRN [id number]  PCP:  Lysle Saunas, MD (Inactive)  Cardiologist:  Janelle Mediate, MD    Referring MD: Sheryl Donna, NP   Chief Complaint  Patient presents with   Sleep Apnea   Hypertension    History of Present Illness:    [name] is a 68 y.o. male with a hx of asthma, CHF followed in AHF clinic, PAF, GERD, HTN and OSA.  He was initially dx with OSA by sleep study in 2018 done due to excessive daytime sleepiness and atrial fibrillation.  His split night sleep study showed mild OSA with an AHI of 13.8/hr overall and mild central sleep apnea with a CAI of 6.7/hr.  There was not enough time during the study to adequately titrate his CPAP and full night CPAP titration was ordered.   When I saw him in 2022 we reviewed his sleep study from 2018 which showed mild OSA with an AHI of 14/h.  Apparently his PCP placed him on auto CPAP and felt like he was smothering and could not tolerate the full facemask.  I recommended that he go back for a CPAP titration in the lab as we may need to switch over to BiPAP but he cancelled the appointment. He has tried the device a few times but cannot sleep with it. He has tried the full face mask and the under the nose FFM but cannot get used to them   Past Medical History:  Diagnosis Date   Asthma, persistent    BPH (benign prostatic hyperplasia) 2014   CHF (congestive heart failure) (HCC)    Combined hyperlipidemia    Dysrhythmia    ATRIAL FIBRILLATION   GERD (gastroesophageal reflux disease)    History of kidney stones    Hypertension    Lymphocytic colitis 07/2011   MICROSCOPIC   Obesity    Persistent atrial fibrillation (HCC)    Renal stone 04/2012   Seasonal allergic rhinitis    Sleep apnea    does not use a cpap machine    Past Surgical History:  Procedure Laterality Date   ATRIAL FIBRILLATION ABLATION N/A 03/16/2019   Procedure: ATRIAL FIBRILLATION  ABLATION;  Surgeon: Jolly Needle, MD;  Location: [facility];  Service: Cardiovascular;  Laterality: N/A;   ATRIAL FIBRILLATION ABLATION N/A 06/30/2022   Procedure: ATRIAL FIBRILLATION ABLATION;  Surgeon: Efraim Grange, MD;  Location: [facility];  Service: Cardiovascular;  Laterality: N/A;   BOWEL RESECTION N/A 02/07/2022   Procedure: SMALL BOWEL RESECTION;  Surgeon: Adalberto Acton, MD;  Location: [facility] OR;  Service: General;  Laterality: N/A;   CARDIOVERSION N/A 04/23/2016   Procedure: CARDIOVERSION;  Surgeon: Mardell Shade, MD;  Location: [facility] ENDOSCOPY;  Service: Cardiovascular;  Laterality: N/A;   CARDIOVERSION N/A 01/12/2022   Procedure: CARDIOVERSION;  Surgeon: Sheryle Donning, MD;  Location: [facility] ENDOSCOPY;  Service: Cardiovascular;  Laterality: N/A;   CARDIOVERSION N/A 10/18/2022   Procedure: CARDIOVERSION;  Surgeon: Hugh Madura, MD;  Location: [facility];  Service: Cardiovascular;  Laterality: N/A;   CYSTOSCOPY WITH RETROGRADE PYELOGRAM, URETEROSCOPY AND STENT PLACEMENT Left 07/29/2017   Procedure: CYSTOSCOPY WITH RETROGRADE PYELOGRAM AND LEFT STENT PLACEMENT;  Surgeon: Trent Frizzle, MD;  Location: [facility] ORS;  Service: Urology;  Laterality: Left;   EXTRACORPOREAL SHOCK WAVE LITHOTRIPSY Left 08/08/2017   Procedure: LEFT EXTRACORPOREAL SHOCK WAVE LITHOTRIPSY (ESWL);  Surgeon: Terese Fendt  Royston Cornea, MD;  Location: [facility] ORS;  Service: Urology;  Laterality: Left;   LAPAROTOMY N/A 02/07/2022   Procedure: EXPLORATORY LAPAROTOMY;  Surgeon: Adalberto Acton, MD;  Location: [facility] OR;  Service: General;  Laterality: N/A;   TEE WITHOUT CARDIOVERSION N/A 04/23/2016   Procedure: TRANSESOPHAGEAL ECHOCARDIOGRAM (TEE);  Surgeon: Mardell Shade, MD;  Location: [facility] ENDOSCOPY;  Service: Cardiovascular;  Laterality: N/A;   VENTRAL HERNIA REPAIR N/A 02/07/2022   Procedure: REPAIR INCARCERATED VENTRAL  HERNIA;  Surgeon: Adalberto Acton, MD;  Location: [facility] OR;  Service: General;   Laterality: N/A;   XI ROBOTIC ASSISTED SIMPLE PROSTATECTOMY N/A 12/25/2018   Procedure: XI ROBOTIC ASSISTED SIMPLE PROSTATECTOMY, REMOVAL OF BLADDER STONES;  Surgeon: Marco Severs, MD;  Location: [facility] ORS;  Service: Urology;  Laterality: N/A;    Current Medications: Current Meds  Medication Sig   acetaminophen  (TYLENOL ) 325 MG tablet Take 2 tablets (650 mg total) by mouth every 6 (six) hours as needed for mild pain or headache. (Patient taking differently: Take 650 mg by mouth as needed for mild pain (pain score 1-3) or headache.)   albuterol  (PROVENTIL  HFA;VENTOLIN  HFA) 108 (90 Base) MCG/ACT inhaler Inhale 2 puffs into the lungs every 6 (six) hours as needed for wheezing or shortness of breath.   amLODipine  (NORVASC ) 10 MG tablet TAKE 1 TABLET BY MOUTH EVERY DAY   ELIQUIS  5 MG TABS tablet TAKE 1 TABLET BY MOUTH TWICE A DAY   empagliflozin  (JARDIANCE ) 10 MG TABS tablet Take 1 tablet (10 mg total) by mouth daily before breakfast.   fluticasone -salmeterol (ADVAIR DISKUS) 250-50 MCG/ACT AEPB Inhale 1 puff into the lungs in the morning and at bedtime.   furosemide  (LASIX ) 40 MG tablet TAKE ONE TABLET BY MOUTH ON MONDAYS AND FRIDAYS   pantoprazole  (PROTONIX ) 40 MG tablet Take 40 mg by mouth daily.   rosuvastatin (CRESTOR) 5 MG tablet    sacubitril -valsartan  (ENTRESTO ) 97-103 MG Take 1 tablet by mouth 2 (two) times daily. NEEDS FOLLOW UP APPOINTMENT FOR MORE REFILLS   tadalafil (CIALIS) 5 MG tablet Take 5 mg by mouth daily.     Allergies:   Ibuprofen , Isosorbide , Lisinopril, Digoxin  and related, Eplerenone , and Spironolactone    Social History   Socioeconomic History   Marital status: Married    Spouse name: Not on file   Number of children: Not on file   Years of education: Not on file   Highest education level: Not on file  Occupational History   Occupation: GREENHOUSE MANAGEMENT  Tobacco Use   Smoking status: Never   Smokeless tobacco: Never  Vaping Use   Vaping status: Never  Used  Substance and Sexual Activity   Alcohol use: No   Drug use: No   Sexual activity: Not on file  Other Topics Concern   Not on file  Social History Narrative   Lives in [place] with his spouse   He works as a Secondary school teacher Strain: Not on file  Food Insecurity: No Food Insecurity (10/18/2022)   Hunger Vital Sign    Worried About Running Out of Food in the Last Year: Never true    Ran Out of Food in the Last Year: Never true  Transportation Needs: No Transportation Needs (10/18/2022)   PRAPARE - Administrator, Civil Service (Medical): No    Lack of Transportation (Non-Medical): No  Physical Activity: Not on file  Stress: Not on file  Social Connections: Not on  file     Family History: The patient's family history includes Hypertension in his mother.  ROS:   Please see the history of present illness.    ROS  All other systems reviewed and negative.   EKGs/Labs/Other Studies Reviewed:    The following studies were reviewed today: Sleep study from 2018  Recent Labs: 10/07/2022: B Natriuretic Peptide 121.0 10/17/2022: Magnesium  2.2 10/18/2022: Hemoglobin 10.6; Platelets 345 11/22/2022: ALT 15; BUN 28; Creatinine, Ser 1.83; Potassium 4.5; Sodium 140; TSH 0.528   Recent Lipid Panel No results found for: "CHOL", "TRIG", "HDL", "CHOLHDL", "VLDL", "LDLCALC", "LDLDIRECT"  CHA2DS2-VASc Score = 4 [CHF History: 1, HTN History: 1, Diabetes History: 0, Stroke History: 0, Vascular Disease History: 1, Age Score: 1, Gender Score: 0].  Therefore, the patient's annual risk of stroke is 4.8 %.        Physical Exam:    VS:  BP (!) 142/78   Pulse (!) 58   Ht 6' (1.829 m)   Wt 249 lb 6.4 oz (113.1 kg)   SpO2 97%   BMI 33.82 kg/m     Wt Readings from Last 3 Encounters:  02/09/23 249 lb 6.4 oz (113.1 kg)  11/22/22 245 lb 3.2 oz (111.2 kg)  10/07/22 249 lb 9.6 oz (113.2 kg)    GEN: Well nourished, well developed in no  acute distress HEENT: Normal NECK: No JVD; No carotid bruits LYMPHATICS: No lymphadenopathy CARDIAC:RRR, no murmurs, rubs, gallops RESPIRATORY:  Clear to auscultation without rales, wheezing or rhonchi  ABDOMEN: Soft, non-tender, non-distended MUSCULOSKELETAL:  No edema; No deformity  SKIN: Warm and dry NEUROLOGIC:  Alert and oriented x 3 PSYCHIATRIC:  Normal affect  ASSESSMENT:    1. OSA (obstructive sleep apnea)   2. Primary hypertension     PLAN:    In order of problems listed above:  1.  OSA -he has a sleep study done in 2018 showing overall mild OSA with an AHI of almost 14/hr and mild central sleep apnea with inadequate time for titration -PCP placed him on auto CPAP but he feels like he is smothering and not getting enough air and does not tolerate the FFM or the under the nose FFM -he does not really feel fatigued during the day and does not nap in the daytime.  He has not been told he snores and has not awakened gasping for breath or waking himself up snoring.   -I have recommended trying a nasal pillow mask and chin strap with a mask desensitization through the sleep lab -after the mask desensitization will send back to the sleep lab for a split night sleep study  2.  HTN -BP controlled on exam today -continue ENtresto  97=103mg  BID, Amlodipine  10mg  daily with PRN refills   Medication Adjustments/Labs and Tests Ordered: Current medicines are reviewed at length with the patient today.  Concerns regarding medicines are outlined above.  No orders of the defined types were placed in this encounter.  No orders of the defined types were placed in this encounter.   Signed, Gaylyn Keas, MD  02/09/2023 3:55 PM    [facility]

## 2023-02-14 ENCOUNTER — Other Ambulatory Visit: Payer: Self-pay

## 2023-02-14 DIAGNOSIS — G4733 Obstructive sleep apnea (adult) (pediatric): Secondary | ICD-10-CM

## 2023-02-14 DIAGNOSIS — I10 Essential (primary) hypertension: Secondary | ICD-10-CM

## 2023-02-14 DIAGNOSIS — I5022 Chronic systolic (congestive) heart failure: Secondary | ICD-10-CM

## 2023-02-17 DIAGNOSIS — I5032 Chronic diastolic (congestive) heart failure: Secondary | ICD-10-CM | POA: Diagnosis not present

## 2023-02-17 DIAGNOSIS — D6869 Other thrombophilia: Secondary | ICD-10-CM | POA: Diagnosis not present

## 2023-02-17 DIAGNOSIS — L299 Pruritus, unspecified: Secondary | ICD-10-CM | POA: Diagnosis not present

## 2023-02-17 DIAGNOSIS — I480 Paroxysmal atrial fibrillation: Secondary | ICD-10-CM | POA: Diagnosis not present

## 2023-02-17 DIAGNOSIS — L209 Atopic dermatitis, unspecified: Secondary | ICD-10-CM | POA: Diagnosis not present

## 2023-02-22 DIAGNOSIS — I10 Essential (primary) hypertension: Secondary | ICD-10-CM | POA: Diagnosis not present

## 2023-02-22 DIAGNOSIS — Z125 Encounter for screening for malignant neoplasm of prostate: Secondary | ICD-10-CM | POA: Diagnosis not present

## 2023-02-22 DIAGNOSIS — D509 Iron deficiency anemia, unspecified: Secondary | ICD-10-CM | POA: Diagnosis not present

## 2023-02-22 DIAGNOSIS — Z1331 Encounter for screening for depression: Secondary | ICD-10-CM | POA: Diagnosis not present

## 2023-02-22 DIAGNOSIS — Z0000 Encounter for general adult medical examination without abnormal findings: Secondary | ICD-10-CM | POA: Diagnosis not present

## 2023-03-02 DIAGNOSIS — N1832 Chronic kidney disease, stage 3b: Secondary | ICD-10-CM | POA: Diagnosis not present

## 2023-03-07 DIAGNOSIS — N1832 Chronic kidney disease, stage 3b: Secondary | ICD-10-CM | POA: Diagnosis not present

## 2023-03-07 DIAGNOSIS — R809 Proteinuria, unspecified: Secondary | ICD-10-CM | POA: Diagnosis not present

## 2023-03-07 DIAGNOSIS — I129 Hypertensive chronic kidney disease with stage 1 through stage 4 chronic kidney disease, or unspecified chronic kidney disease: Secondary | ICD-10-CM | POA: Diagnosis not present

## 2023-03-07 DIAGNOSIS — N189 Chronic kidney disease, unspecified: Secondary | ICD-10-CM | POA: Diagnosis not present

## 2023-03-07 DIAGNOSIS — N281 Cyst of kidney, acquired: Secondary | ICD-10-CM | POA: Diagnosis not present

## 2023-03-07 DIAGNOSIS — I5022 Chronic systolic (congestive) heart failure: Secondary | ICD-10-CM | POA: Diagnosis not present

## 2023-03-07 DIAGNOSIS — Z87442 Personal history of urinary calculi: Secondary | ICD-10-CM | POA: Diagnosis not present

## 2023-03-21 DIAGNOSIS — J4530 Mild persistent asthma, uncomplicated: Secondary | ICD-10-CM | POA: Diagnosis not present

## 2023-03-21 DIAGNOSIS — J019 Acute sinusitis, unspecified: Secondary | ICD-10-CM | POA: Diagnosis not present

## 2023-03-21 DIAGNOSIS — D6869 Other thrombophilia: Secondary | ICD-10-CM | POA: Diagnosis not present

## 2023-03-21 DIAGNOSIS — I482 Chronic atrial fibrillation, unspecified: Secondary | ICD-10-CM | POA: Diagnosis not present

## 2023-03-21 DIAGNOSIS — Z03818 Encounter for observation for suspected exposure to other biological agents ruled out: Secondary | ICD-10-CM | POA: Diagnosis not present

## 2023-03-21 DIAGNOSIS — J069 Acute upper respiratory infection, unspecified: Secondary | ICD-10-CM | POA: Diagnosis not present

## 2023-03-21 DIAGNOSIS — I10 Essential (primary) hypertension: Secondary | ICD-10-CM | POA: Diagnosis not present

## 2023-03-24 ENCOUNTER — Other Ambulatory Visit (HOSPITAL_COMMUNITY): Payer: Self-pay | Admitting: Internal Medicine

## 2023-04-06 DIAGNOSIS — J302 Other seasonal allergic rhinitis: Secondary | ICD-10-CM | POA: Diagnosis not present

## 2023-04-06 DIAGNOSIS — I5020 Unspecified systolic (congestive) heart failure: Secondary | ICD-10-CM | POA: Diagnosis not present

## 2023-04-06 DIAGNOSIS — J4530 Mild persistent asthma, uncomplicated: Secondary | ICD-10-CM | POA: Diagnosis not present

## 2023-04-06 DIAGNOSIS — I10 Essential (primary) hypertension: Secondary | ICD-10-CM | POA: Diagnosis not present

## 2023-04-20 NOTE — Progress Notes (Signed)
 Advanced Heart Failure Clinic Note   PCP: Dr Valentina Lucks Urology:  Dr Retta Diones  Primary Cardiologist: Dr. Eden Emms HF: Dr Gala Romney   HPI:   [name] is a 68 y.o.male with a past medical history of tachy mediated cardiomyopathy, persistent atrial fibrillation (on Eliquis), systolic CHF (EF 09-60%) and CKD 3b.   He was first diagnosed with Afib in 02/2016, seen in the Afib clinic and the ED for this. He underwent 2 DC-CV and only held NSR for a few days. He started Amiodarone.   Admitted 04/16/16-04/27/16 with rapid afib and decompensation. He was started on milrinone for marginal mixed venous sat of 57% and optimization with plans for repeat DCCV after loading with IV Amiodarone. Underwent TEE/DCCV on 04/23/16 with restoration of NSR.He was continued on Eliquis for anticoagulation at discharge, as he did have some evidence of LV thrombus on Echo, however it appeared to have resolved when he had a TEE on 04/23/16. Overall diuresed 9.5L and discharge weight was 217 pounds.   July 2019 he had kidney stones and underwent lithotripsy.   Echo 7/20 EF 50-55%.   In 7/20 was doing very well. EF 50-55%  Maintaining NSR on amio 200 daily. Amio cut back to 100 daily due to sinus brady with HR in 40s. Several weeks later presented for a sick visit with recurrent AF with RVR, We reloaded amio with 400 bid and scheduled DC-CV. Several days later presented for DC-CV and was in NSR.   Underwent PVI with Dr. Johney Frame 2/21.    Zio in 8/21 Sinus avg HR 56 with nighttime brady in 30s. 5% PVCs. No AF  Echo 8/21 EF 60-65%  Echo 8/23: EF 45-50%, LV with GHK, GIDD, normal RV  Presented to ED 1/24 with abdominal pain 2/2 strangulated abdominal wall hernia. Had emergent laparotomy and repair of hernia and SBO.  Discharge weight 250 lbs.   S/p a fib ablation 06/30/22 with Dr. Nelly Laurence  Admitted 9/24 with AF. Loaded with IV amiodarone and underwent DCCV to NSR. Amio changed to po and he was discharged home, off  Inspra.  Today he returns for HF follow up. Overall feeling fine. Had flu then PNA a month ago, recovering from this but feeling better. No SOB walking on flat ground. Able to walk up steps now. Legs swelling a bit, but he missed Lasix doses last week. Denies palpitations, abnormal bleeding, CP, dizziness, or PND/Orthopnea. Appetite ok. No fever or chills. Weight at home 245 pounds. Taking all medications. No tobacco, ETOH or drugs. BP at home 117-120 systolic.  Cardiac studies: Echo 8/23: EF 45-50%, LV with GHK, GIDD, normal RV Echo 8/21 EF 60-65% Echo 3/18 EF 40-45% RV mildly HK  Echo 2/19 EF 40-45% RV mildly reduced Echo 7/20 EF 50-55%   Review of systems complete and found to be negative unless listed in HPI.   Past Medical History:  Diagnosis Date   Asthma, persistent    BPH (benign prostatic hyperplasia) 2014   CHF (congestive heart failure) (HCC)    Combined hyperlipidemia    Dysrhythmia    ATRIAL FIBRILLATION   GERD (gastroesophageal reflux disease)    History of kidney stones    Hypertension    Lymphocytic colitis 07/2011   MICROSCOPIC   Obesity    Persistent atrial fibrillation (HCC)    Renal stone 04/2012   Seasonal allergic rhinitis    Sleep apnea    does not use a cpap machine   Current Outpatient Medications  Medication Sig Dispense Refill  acetaminophen (TYLENOL) 325 MG tablet Take 2 tablets (650 mg total) by mouth every 6 (six) hours as needed for mild pain or headache. (Patient taking differently: Take 650 mg by mouth as needed for mild pain (pain score 1-3) or headache.)     albuterol (PROVENTIL HFA;VENTOLIN HFA) 108 (90 Base) MCG/ACT inhaler Inhale 2 puffs into the lungs every 6 (six) hours as needed for wheezing or shortness of breath.     amiodarone (PACERONE) 200 MG tablet Take 200 mg by mouth daily.     amLODipine (NORVASC) 5 MG tablet Take 5 mg by mouth daily.     ELIQUIS 5 MG TABS tablet TAKE 1 TABLET BY MOUTH TWICE A DAY 60 tablet 2   empagliflozin  (JARDIANCE) 10 MG TABS tablet Take 1 tablet (10 mg total) by mouth daily before breakfast. 30 tablet 6   ENTRESTO 97-103 MG TAKE 1 TABLET BY MOUTH 2 (TWO) TIMES DAILY. NEEDS FOLLOW UP APPOINTMENT FOR MORE REFILLS 60 tablet 2   fluticasone-salmeterol (ADVAIR DISKUS) 250-50 MCG/ACT AEPB Inhale 1 puff into the lungs in the morning and at bedtime.     furosemide (LASIX) 40 MG tablet TAKE ONE TABLET BY MOUTH ON MONDAYS AND FRIDAYS 30 tablet 1   pantoprazole (PROTONIX) 40 MG tablet Take 40 mg by mouth daily.     rosuvastatin (CRESTOR) 5 MG tablet      tadalafil (CIALIS) 5 MG tablet Take 5 mg by mouth daily.     No current facility-administered medications for this encounter.   Allergies  Allergen Reactions   Ibuprofen Other (See Comments)    Has a-Fib, unable to take   Isosorbide Other (See Comments)    HEADACHES from Imdur   Lisinopril Cough   Digoxin And Related Rash   Eplerenone Rash   Spironolactone Rash   Social History   Socioeconomic History   Marital status: Married    Spouse name: Not on file   Number of children: Not on file   Years of education: Not on file   Highest education level: Not on file  Occupational History   Occupation: GREENHOUSE MANAGEMENT  Tobacco Use   Smoking status: Never   Smokeless tobacco: Never  Vaping Use   Vaping status: Never Used  Substance and Sexual Activity   Alcohol use: No   Drug use: No   Sexual activity: Not on file  Other Topics Concern   Not on file  Social History Narrative   Lives in [place] with his spouse   He works as a Secondary school teacher Strain: Not on file  Food Insecurity: No Food Insecurity (10/18/2022)   Hunger Vital Sign    Worried About Running Out of Food in the Last Year: Never true    Ran Out of Food in the Last Year: Never true  Transportation Needs: No Transportation Needs (10/18/2022)   PRAPARE - Administrator, Civil Service (Medical): No    Lack of  Transportation (Non-Medical): No  Physical Activity: Not on file  Stress: Not on file  Social Connections: Not on file  Intimate Partner Violence: Not At Risk (10/18/2022)   Humiliation, Afraid, Rape, and Kick questionnaire    Fear of Current or Ex-Partner: No    Emotionally Abused: No    Physically Abused: No    Sexually Abused: No   Family History  Problem Relation Age of Onset   Hypertension Mother    BP (!) 142/78  Pulse 61   Ht 6' (1.829 m)   Wt 111.7 kg (246 lb 3.2 oz)   SpO2 95%   BMI 33.39 kg/m   Wt Readings from Last 3 Encounters:  04/22/23 111.7 kg (246 lb 3.2 oz)  02/09/23 113.1 kg (249 lb 6.4 oz)  11/22/22 111.2 kg (245 lb 3.2 oz)   PHYSICAL EXAM: General:  NAD. No resp difficulty, walked into clinic HEENT: Normal Neck: Supple. No JVD. Cor: Regular rate & rhythm. No rubs, gallops or murmurs. Lungs: Clear Abdomen: Soft, nontender, nondistended.  Extremities: No cyanosis, clubbing, rash, + pedal edema Neuro: Alert & oriented x 3, moves all 4 extremities w/o difficulty. Affect pleasant.  ECG (personally reviewed): SB, RBBB 57 bpm  ASSESSMENT & PLAN:  1. Chronic systolic CHF:  - EF 20-25%.  NICM, felt to be tachy mediated.  - Echo 06/2016 LVEF 40%. - Echo 02/2017: EF 40-45% - Echo 7/20 EF 50-55%.   - Echo 8/21 EF 60-65% - Echo 8/23: EF 45-50%, LV with GHK, GIDD, normal RV - Stable NYHA I-II. Volume status stable. - Given Rx for compression hose - Continue Lasix 40 mg on Mondays and Fridays. - Continue Entresto 97/103 bid - Continue Jardiance 10 mg daily. - Failed spiro and eplernone d/t rash and HA.  - No BB with bradycardia. - Labs today - Update echo  2. Paroxsymal atrial fibrillation and frequent PVCs - s/p PVI 2/21. Amio stopped - Zio in 8/21  Sinus avg HR 56 with nighttime brady in 30s. 5% PVCs. No AF -> amio restarted by Dr. Johney Frame - s/p a fib ablation 06/30/22 with Dr. Nelly Laurence - s/p DCCV 9/24 - SB on ECG today. - Continue Eliquis. No bleeding  issues.  - Continue amiodarone, per EP - CBC today.  3. HTN - BP slightly elevated in clinic today.  - Average BP at home 117/70 - No med changes today  4. History of LV thrombus - Resolved.  - No change.   5. Chronic kidney disease stage IIIb:  - baseline creatinine 1.5-2.0.  - Follows with Dr. Malen Gauze with [facility] - Continue Jardiance - BMET today.  6. OSA - Sleep study 4/18 with mild OSA. AHI 13.8/hr.  - Not wearing CPAP - Follows with Dr. Mayford Knife and has appt next week  7. LE edema  - Likely due to venous insufficiency - Given Rx for compression hose  Follow up in 4 months with Dr. Mickle Plumb, FNP  2:51 PM 04/22/23

## 2023-04-21 ENCOUNTER — Telehealth (HOSPITAL_COMMUNITY): Payer: Self-pay

## 2023-04-21 NOTE — Telephone Encounter (Signed)
 Called to confirm/remind patient of their appointment at the Advanced [facility] Failure Clinic on 04/21/23.   Appointment:   [] Confirmed  [x] Left mess   [] No answer/No voice mail  [] Phone not in service  And to bring in all medications and/or complete list.

## 2023-04-22 ENCOUNTER — Encounter (HOSPITAL_COMMUNITY): Payer: Self-pay

## 2023-04-22 ENCOUNTER — Ambulatory Visit (HOSPITAL_COMMUNITY)
Admission: RE | Admit: 2023-04-22 | Discharge: 2023-04-22 | Disposition: A | Discharge status: Home / Self Care | Payer: Medicare HMO | Source: Ambulatory Visit | Attending: Family Medicine | Admitting: Family Medicine

## 2023-04-22 VITALS — BP 142/78 | HR 61 | Ht 72.0 in | Wt 246.2 lb

## 2023-04-22 DIAGNOSIS — R000 Tachycardia, unspecified: Secondary | ICD-10-CM | POA: Insufficient documentation

## 2023-04-22 DIAGNOSIS — I428 Other cardiomyopathies: Secondary | ICD-10-CM | POA: Diagnosis not present

## 2023-04-22 DIAGNOSIS — I130 Hypertensive heart and chronic kidney disease with heart failure and stage 1 through stage 4 chronic kidney disease, or unspecified chronic kidney disease: Secondary | ICD-10-CM | POA: Diagnosis not present

## 2023-04-22 DIAGNOSIS — Z7984 Long term (current) use of oral hypoglycemic drugs: Secondary | ICD-10-CM | POA: Diagnosis not present

## 2023-04-22 DIAGNOSIS — G4733 Obstructive sleep apnea (adult) (pediatric): Secondary | ICD-10-CM

## 2023-04-22 DIAGNOSIS — I493 Ventricular premature depolarization: Secondary | ICD-10-CM | POA: Insufficient documentation

## 2023-04-22 DIAGNOSIS — I10 Essential (primary) hypertension: Secondary | ICD-10-CM

## 2023-04-22 DIAGNOSIS — I513 Intracardiac thrombosis, not elsewhere classified: Secondary | ICD-10-CM | POA: Diagnosis not present

## 2023-04-22 DIAGNOSIS — M7989 Other specified soft tissue disorders: Secondary | ICD-10-CM | POA: Insufficient documentation

## 2023-04-22 DIAGNOSIS — I5022 Chronic systolic (congestive) heart failure: Secondary | ICD-10-CM | POA: Diagnosis not present

## 2023-04-22 DIAGNOSIS — I480 Paroxysmal atrial fibrillation: Secondary | ICD-10-CM

## 2023-04-22 DIAGNOSIS — Z79899 Other long term (current) drug therapy: Secondary | ICD-10-CM | POA: Diagnosis not present

## 2023-04-22 DIAGNOSIS — Z7901 Long term (current) use of anticoagulants: Secondary | ICD-10-CM | POA: Diagnosis not present

## 2023-04-22 DIAGNOSIS — R600 Localized edema: Secondary | ICD-10-CM

## 2023-04-22 DIAGNOSIS — N1832 Chronic kidney disease, stage 3b: Secondary | ICD-10-CM

## 2023-04-22 DIAGNOSIS — I4819 Other persistent atrial fibrillation: Secondary | ICD-10-CM | POA: Diagnosis not present

## 2023-04-22 LAB — CBC
HCT: 31.2 % — ABNORMAL LOW (ref 39.0–52.0)
Hemoglobin: 9.6 g/dL — ABNORMAL LOW (ref 13.0–17.0)
MCH: 22 pg — ABNORMAL LOW (ref 26.0–34.0)
MCHC: 30.8 g/dL (ref 30.0–36.0)
MCV: 71.4 fL — ABNORMAL LOW (ref 80.0–100.0)
Platelets: 411 10*3/uL — ABNORMAL HIGH (ref 150–400)
RBC: 4.37 MIL/uL (ref 4.22–5.81)
RDW: 19.6 % — ABNORMAL HIGH (ref 11.5–15.5)
WBC: 5.1 10*3/uL (ref 4.0–10.5)
nRBC: 0 % (ref 0.0–0.2)

## 2023-04-22 LAB — BASIC METABOLIC PANEL WITH GFR
Anion gap: 5 (ref 5–15)
BUN: 17 mg/dL (ref 8–23)
CO2: 25 mmol/L (ref 22–32)
Calcium: 8.8 mg/dL — ABNORMAL LOW (ref 8.9–10.3)
Chloride: 107 mmol/L (ref 98–111)
Creatinine, Ser: 1.67 mg/dL — ABNORMAL HIGH (ref 0.61–1.24)
GFR, Estimated: 45 mL/min — ABNORMAL LOW (ref >60)
Glucose, Bld: 91 mg/dL (ref 70–99)
Potassium: 4 mmol/L (ref 3.5–5.1)
Sodium: 137 mmol/L (ref 135–145)

## 2023-04-22 LAB — BRAIN NATRIURETIC PEPTIDE: B Natriuretic Peptide: 156.5 pg/mL — ABNORMAL HIGH (ref 0.0–100.0)

## 2023-04-22 NOTE — Patient Instructions (Signed)
 Great to see you today!!!  Medication Changes:  None, continue current medications  Lab Work:  Labs done today, we will call you for abnormal results  Testing/Procedures:  Your physician has requested that you have an echocardiogram. Echocardiography is a painless test that uses sound waves to create images of your heart. It provides your doctor with information about the size and shape of your heart and how well your heart's chambers and valves are working. This procedure takes approximately one hour. There are no restrictions for this procedure. Please do NOT wear cologne, perfume, aftershave, or lotions (deodorant is allowed). Please arrive 15 minutes prior to your appointment time.  Please note: We ask at that you not bring children with you during ultrasound (echo/ vascular) testing. Due to room size and safety concerns, children are not allowed in the ultrasound rooms during exams. Our front office staff cannot provide observation of children in our lobby area while testing is being conducted. An adult accompanying a patient to their appointment will only be allowed in the ultrasound room at the discretion of the ultrasound technician under special circumstances. We apologize for any inconvenience.  Special Instructions // Education:  Do the following things EVERYDAY: Weigh yourself in the morning before breakfast. Write it down and keep it in a log. Take your medicines as prescribed Eat low salt foods--Limit salt (sodium) to 2000 mg per day.  Stay as active as you can everyday Limit all fluids for the day to less than 2 liters  Please wear your compression hose daily, place them on as soon as you get up in the morning and remove before you go to bed at night.   Follow-Up in: 4 months (July), **PLEASE CALL OUR OFFICE IN MAY TO SCHEDULE THIS APPOINTMENT   At the Advanced [facility] Failure Clinic, you and your health needs are our priority. We have a designated team specialized in  the treatment of Heart Failure. This Care Team includes your primary Heart Failure Specialized Cardiologist (physician), Advanced Practice Providers (APPs- Physician Assistants and Nurse Practitioners), and Pharmacist who all work together to provide you with the care you need, when you need it.   You may see any of the following providers on your designated Care Team at your next follow up:  Dr. Arvilla Meres Dr. Marca Ancona Dr. Dorthula Nettles Dr. Theresia Bough Tonye Becket, NP Robbie Lis, [facility] [place], [place] Brynda Peon, NP [place] Lee, NP Karle Plumber, PharmD   Please be sure to bring in all your medications bottles to every appointment.   Need to Contact us:  If you have any questions or concerns before your next appointment please send [place] a message through [place] or call our office at [phone].    TO LEAVE A MESSAGE FOR THE NURSE SELECT OPTION 2, PLEASE LEAVE A MESSAGE INCLUDING: YOUR NAME DATE OF BIRTH CALL BACK NUMBER REASON FOR CALL**this is important as we prioritize the call backs  YOU WILL RECEIVE A CALL BACK THE SAME DAY AS LONG AS YOU CALL BEFORE 4:00 PM

## 2023-04-25 ENCOUNTER — Telehealth (HOSPITAL_COMMUNITY): Payer: Self-pay

## 2023-04-25 NOTE — Telephone Encounter (Signed)
 Spoke with patient regarding the following results. Patient made aware and patient verbalized understanding.

## 2023-04-25 NOTE — Telephone Encounter (Signed)
-----   Message from [place] sent at 04/22/2023  4:18 PM EDT ----- Labs stable, hgb 1 gram lower than last check 6 months ago.   Watch for s/s of bleeding. Otherwise no change to heart meds

## 2023-04-27 ENCOUNTER — Other Ambulatory Visit (HOSPITAL_BASED_OUTPATIENT_CLINIC_OR_DEPARTMENT_OTHER): Payer: Medicare HMO

## 2023-04-27 ENCOUNTER — Other Ambulatory Visit: Payer: Self-pay | Admitting: Cardiovascular Disease

## 2023-04-27 ENCOUNTER — Ambulatory Visit (HOSPITAL_BASED_OUTPATIENT_CLINIC_OR_DEPARTMENT_OTHER): Attending: Cardiology

## 2023-04-27 ENCOUNTER — Other Ambulatory Visit (HOSPITAL_COMMUNITY): Payer: Self-pay | Admitting: Internal Medicine

## 2023-04-27 DIAGNOSIS — G4733 Obstructive sleep apnea (adult) (pediatric): Secondary | ICD-10-CM

## 2023-04-27 NOTE — Telephone Encounter (Signed)
 This is a CHF pt

## 2023-05-17 ENCOUNTER — Other Ambulatory Visit (HOSPITAL_COMMUNITY): Payer: Self-pay | Admitting: Cardiology

## 2023-05-17 MED ORDER — AMIODARONE HCL 200 MG PO TABS: 200.0000 mg | ORAL_TABLET | Freq: Every day | ORAL | 11 refills | Status: AC

## 2023-05-17 NOTE — Telephone Encounter (Signed)
 Refills requested Meds sent

## 2023-05-19 ENCOUNTER — Ambulatory Visit (HOSPITAL_COMMUNITY)
Admission: RE | Admit: 2023-05-19 | Discharge: 2023-05-19 | Disposition: A | Discharge status: Home / Self Care | Source: Ambulatory Visit | Attending: Family Medicine | Admitting: Family Medicine

## 2023-05-19 DIAGNOSIS — I5022 Chronic systolic (congestive) heart failure: Secondary | ICD-10-CM | POA: Insufficient documentation

## 2023-05-19 DIAGNOSIS — I080 Rheumatic disorders of both mitral and aortic valves: Secondary | ICD-10-CM | POA: Insufficient documentation

## 2023-05-19 LAB — ECHOCARDIOGRAM COMPLETE
AR max vel: 3.22 cm2
AV Area VTI: 3 cm2
AV Area mean vel: 2.88 cm2
AV Mean grad: 3 mmHg
AV Peak grad: 7.2 mmHg
Ao pk vel: 1.34 m/s
Area-P 1/2: 1.88 cm2
Calc EF: 46.1 %
P 1/2 time: 1716 ms
S' Lateral: 4.8 cm
Single Plane A2C EF: 45.1 %
Single Plane A4C EF: 46.9 %

## 2023-05-19 NOTE — Progress Notes (Signed)
*  PRELIMINARY RESULTS* Echocardiogram 2D Echocardiogram has been performed.  Christon Gallaway D Lavaughn Haberle 05/19/2023, 3:55 PM

## 2023-05-20 ENCOUNTER — Telehealth (HOSPITAL_COMMUNITY): Payer: Self-pay

## 2023-05-20 NOTE — Telephone Encounter (Signed)
-----   Message from [place] sent at 05/20/2023  8:11 AM EDT ----- EF improved from previous, now 45-50%, normal RV

## 2023-05-20 NOTE — Telephone Encounter (Signed)
Spoke with patient regarding the following results. Patient made aware and patient verbalized understanding.   Advised patient to call back to office with any issues, questions, or concerns. Patient verbalized understanding.

## 2023-05-31 ENCOUNTER — Telehealth (HOSPITAL_COMMUNITY): Payer: Self-pay | Admitting: *Deleted

## 2023-05-31 NOTE — Telephone Encounter (Signed)
 Received fax from Dr Callie Cater DDS, pt is sch for tooth extraction (2 teeth)  Per Dr Julane Ny: There are no limitations  Form signed and faxed back to [phone]

## 2023-06-17 ENCOUNTER — Other Ambulatory Visit (HOSPITAL_COMMUNITY): Payer: Self-pay | Admitting: Internal Medicine

## 2023-08-30 DIAGNOSIS — R3129 Other microscopic hematuria: Secondary | ICD-10-CM | POA: Diagnosis not present

## 2023-08-30 DIAGNOSIS — K529 Noninfective gastroenteritis and colitis, unspecified: Secondary | ICD-10-CM | POA: Diagnosis not present

## 2023-09-09 DIAGNOSIS — K529 Noninfective gastroenteritis and colitis, unspecified: Secondary | ICD-10-CM | POA: Diagnosis not present

## 2023-09-21 ENCOUNTER — Telehealth: Payer: Self-pay

## 2023-09-21 DIAGNOSIS — K5289 Other specified noninfective gastroenteritis and colitis: Secondary | ICD-10-CM | POA: Diagnosis not present

## 2023-09-21 DIAGNOSIS — Z7901 Long term (current) use of anticoagulants: Secondary | ICD-10-CM | POA: Diagnosis not present

## 2023-09-21 DIAGNOSIS — D649 Anemia, unspecified: Secondary | ICD-10-CM | POA: Diagnosis not present

## 2023-09-21 DIAGNOSIS — R197 Diarrhea, unspecified: Secondary | ICD-10-CM | POA: Diagnosis not present

## 2023-09-21 DIAGNOSIS — I4891 Unspecified atrial fibrillation: Secondary | ICD-10-CM | POA: Diagnosis not present

## 2023-09-21 DIAGNOSIS — Z86018 Personal history of other benign neoplasm: Secondary | ICD-10-CM | POA: Diagnosis not present

## 2023-09-21 NOTE — Telephone Encounter (Signed)
   Pre-operative Risk Assessment    Patient Name: [name]  DOB: September 17, 1955 MRN: [id number]   Date of last office visit: 02/09/23 Date of next office visit: Not scheduled   Request for Surgical Clearance    Procedure:  Colonoscopy and Endoscopy  Date of Surgery:  Clearance 10/17/23                                Surgeon:  Dr. Elsie Cree Surgeon's Group or Practice Name:  [facility] Phone number:  [phone] Fax number:  [phone]   Type of Clearance Requested:   - Medical  - Pharmacy:  Hold Apixaban  (Eliquis )     Type of Anesthesia:  Propofol    Additional requests/questions:    [name]   09/21/2023, 4:50 PM

## 2023-09-21 NOTE — Telephone Encounter (Signed)
 Pharmacy please advise on holding Eliquis  prior to colonoscopy/endoscopy scheduled for 10/17/2023. Thank you.

## 2023-09-22 ENCOUNTER — Telehealth: Payer: Self-pay | Admitting: *Deleted

## 2023-09-22 NOTE — Telephone Encounter (Signed)
   Name: [name]  DOB: 1955-03-13  MRN: [id number]  Primary Cardiologist: Maude Emmer, MD   Preoperative team, please contact this patient and set up a phone call appointment for further preoperative risk assessment. Please obtain consent and complete medication review. Thank you for your help.  I confirm that guidance regarding antiplatelet and oral anticoagulation therapy has been completed and, if necessary, noted below.  Per office protocol, patient can hold Eliquis  for 2 days prior to procedure.   Patient will not need bridging with Lovenox (enoxaparin) around procedure.  I also confirmed the patient resides in the state of [place] . As per [facility] [employer] telemedicine laws, the patient must reside in the state in which the provider is licensed.   Wyn Raddle, Jackee Shove, NP 09/22/2023, 12:44 PM [facility]

## 2023-09-22 NOTE — Telephone Encounter (Signed)
 Pt has been scheduled tele preop appt 10/06/23. Med rec and consent are done.     Patient Consent for Virtual Visit        NIKE SOUTHWELL has provided verbal consent on 09/22/2023 for a virtual visit (video or telephone).   CONSENT FOR VIRTUAL VISIT FOR:  Desiderio ONEIDA Boyer  By participating in this virtual visit I agree to the following:  I hereby voluntarily request, consent and authorize [facility] and its employed or contracted physicians, physician assistants, nurse practitioners or other licensed health care professionals (the Practitioner), to provide me with telemedicine health care services (the "Services) as deemed necessary by the treating Practitioner. I acknowledge and consent to receive the Services by the Practitioner via telemedicine. I understand that the telemedicine visit will involve communicating with the Practitioner through live audiovisual communication technology and the disclosure of certain medical information by electronic transmission. I acknowledge that I have been given the opportunity to request an in-person assessment or other available alternative prior to the telemedicine visit and am voluntarily participating in the telemedicine visit.  I understand that I have the right to withhold or withdraw my consent to the use of telemedicine in the course of my care at any time, without affecting my right to future care or treatment, and that the Practitioner or I may terminate the telemedicine visit at any time. I understand that I have the right to inspect all information obtained and/or recorded in the course of the telemedicine visit and may receive copies of available information for a reasonable fee.  I understand that some of the potential risks of receiving the Services via telemedicine include:  Delay or interruption in medical evaluation due to technological equipment failure or disruption; Information transmitted may not be sufficient (e.g. poor resolution  of images) to allow for appropriate medical decision making by the Practitioner; and/or  In rare instances, security protocols could fail, causing a breach of personal health information.  Furthermore, I acknowledge that it is my responsibility to provide information about my medical history, conditions and care that is complete and accurate to the best of my ability. I acknowledge that Practitioner's advice, recommendations, and/or decision may be based on factors not within their control, such as incomplete or inaccurate data provided by me or distortions of diagnostic images or specimens that may result from electronic transmissions. I understand that the practice of medicine is not an exact science and that Practitioner makes no warranties or guarantees regarding treatment outcomes. I acknowledge that a copy of this consent can be made available to me via my patient portal [facility] MyChart), or I can request a printed copy by calling the office of [facility].    I understand that my insurance will be billed for this visit.   I have read or had this consent read to me. I understand the contents of this consent, which adequately explains the benefits and risks of the Services being provided via telemedicine.  I have been provided ample opportunity to ask questions regarding this consent and the Services and have had my questions answered to my satisfaction. I give my informed consent for the services to be provided through the use of telemedicine in my medical care

## 2023-09-22 NOTE — Telephone Encounter (Signed)
 Pt has been scheduled tele preop appt 10/06/23. Med rec and consent are done.

## 2023-09-22 NOTE — Telephone Encounter (Signed)
 Patient with diagnosis of atrial fibrillation on Eliquis  for anticoagulation.    Procedure:  Colonoscopy and Endoscopy   Date of Surgery:  Clearance 10/17/23     CHA2DS2-VASc Score = 3   This indicates a 3.2% annual risk of stroke. The patient's score is based upon: CHF History: 1 HTN History: 1 Diabetes History: 0 Stroke History: 0 Vascular Disease History: 0 Age Score: 1 Gender Score: 0    CrCl 68 Platelet count 411  Patient has not had an Afib/aflutter ablation within the last 3 months or DCCV within the last 30 days  Per office protocol, patient can hold Eliquis  for 2 days prior to procedure.   Patient will not need bridging with Lovenox (enoxaparin) around procedure.  **This guidance is not considered finalized until pre-operative APP has relayed final recommendations.**

## 2023-09-25 ENCOUNTER — Other Ambulatory Visit (HOSPITAL_COMMUNITY): Payer: Self-pay | Admitting: Internal Medicine

## 2023-09-27 DIAGNOSIS — N1832 Chronic kidney disease, stage 3b: Secondary | ICD-10-CM | POA: Diagnosis not present

## 2023-10-04 DIAGNOSIS — N1832 Chronic kidney disease, stage 3b: Secondary | ICD-10-CM | POA: Diagnosis not present

## 2023-10-04 DIAGNOSIS — I5022 Chronic systolic (congestive) heart failure: Secondary | ICD-10-CM | POA: Diagnosis not present

## 2023-10-04 DIAGNOSIS — Z7901 Long term (current) use of anticoagulants: Secondary | ICD-10-CM | POA: Diagnosis not present

## 2023-10-04 DIAGNOSIS — E876 Hypokalemia: Secondary | ICD-10-CM | POA: Diagnosis not present

## 2023-10-04 DIAGNOSIS — I129 Hypertensive chronic kidney disease with stage 1 through stage 4 chronic kidney disease, or unspecified chronic kidney disease: Secondary | ICD-10-CM | POA: Diagnosis not present

## 2023-10-04 DIAGNOSIS — R809 Proteinuria, unspecified: Secondary | ICD-10-CM | POA: Diagnosis not present

## 2023-10-04 DIAGNOSIS — R197 Diarrhea, unspecified: Secondary | ICD-10-CM | POA: Diagnosis not present

## 2023-10-04 DIAGNOSIS — D649 Anemia, unspecified: Secondary | ICD-10-CM | POA: Diagnosis not present

## 2023-10-04 DIAGNOSIS — Z87442 Personal history of urinary calculi: Secondary | ICD-10-CM | POA: Diagnosis not present

## 2023-10-04 DIAGNOSIS — N281 Cyst of kidney, acquired: Secondary | ICD-10-CM | POA: Diagnosis not present

## 2023-10-06 ENCOUNTER — Ambulatory Visit: Attending: Cardiology

## 2023-10-06 DIAGNOSIS — Z01810 Encounter for preprocedural cardiovascular examination: Secondary | ICD-10-CM

## 2023-10-06 NOTE — Progress Notes (Signed)
 Virtual Visit via Telephone Note   Because of ABDOU STOCKS co-morbid illnesses, he is at least at moderate risk for complications without adequate follow up.  This format is felt to be most appropriate for this patient at this time.  Due to technical limitations with video connection (technology), today's appointment will be conducted as an audio only telehealth visit, and JEREMYAH JELLEY verbally agreed to proceed in this manner.   All issues noted in this document were discussed and addressed.  No physical exam could be performed with this format.  Evaluation Performed:  Preoperative cardiovascular risk assessment _____________   Date:  10/06/2023   Patient ID:  [name], DOB [birth year], MRN [id number] Patient Location:  Home Provider location:   Office  Primary Care Provider:  Signa Rush, MD (Inactive) Primary Cardiologist:  Maude Emmer, MD  Chief Complaint / Patient Profile   68 y.o. y/o male with a h/o atrial fibrillation, CHF, GERD, OSA who is pending colonoscopy and endoscopy and presents today for telephonic preoperative cardiovascular risk assessment.  History of Present Illness    ESKER DEVER is a 68 y.o. male who presents via audio/video conferencing for a telehealth visit today.  Pt was last seen in cardiology clinic on 04/22/23 by Dr. Bensimhon.  At that time JAYDIN BONIFACE was doing well.  The patient is now pending procedure as outlined above. Since his last visit, he has been feeling normal no CP or SOB or swelling. No issues with his afib in over a year to his knowledge. He does meet 4 mets.  Per office protocol, patient can hold Eliquis  for 2 days prior to procedure. He can resume taking this when it is medically safe to do so.  Patient will not need bridging with Lovenox (enoxaparin) around procedure  Past Medical History    Past Medical History:  Diagnosis Date   Asthma, persistent    BPH (benign prostatic hyperplasia) 2014   CHF (congestive heart  failure) (HCC)    Combined hyperlipidemia    Dysrhythmia    ATRIAL FIBRILLATION   GERD (gastroesophageal reflux disease)    History of kidney stones    Hypertension    Lymphocytic colitis 07/2011   MICROSCOPIC   Obesity    Persistent atrial fibrillation (HCC)    Renal stone 04/2012   Seasonal allergic rhinitis    Sleep apnea    does not use a cpap machine   Past Surgical History:  Procedure Laterality Date   ATRIAL FIBRILLATION ABLATION N/A 03/16/2019   Procedure: ATRIAL FIBRILLATION ABLATION;  Surgeon: Kelsie Agent, MD;  Location: [facility];  Service: Cardiovascular;  Laterality: N/A;   ATRIAL FIBRILLATION ABLATION N/A 06/30/2022   Procedure: ATRIAL FIBRILLATION ABLATION;  Surgeon: Nancey Eulas BRAVO, MD;  Location: [facility];  Service: Cardiovascular;  Laterality: N/A;   BOWEL RESECTION N/A 02/07/2022   Procedure: SMALL BOWEL RESECTION;  Surgeon: Signe Mitzie LABOR, MD;  Location: [facility] OR;  Service: General;  Laterality: N/A;   CARDIOVERSION N/A 04/23/2016   Procedure: CARDIOVERSION;  Surgeon: Toribio JONELLE Fuel, MD;  Location: [facility] ENDOSCOPY;  Service: Cardiovascular;  Laterality: N/A;   CARDIOVERSION N/A 01/12/2022   Procedure: CARDIOVERSION;  Surgeon: Lonni Slain, MD;  Location: [facility]OSCOPY;  Service: Cardiovascular;  Laterality: N/A;   CARDIOVERSION N/A 10/18/2022   Procedure: CARDIOVERSION;  Surgeon: Jeffrie Oneil BROCKS, MD;  Location: [facility];  Service: Cardiovascular;  Laterality: N/A;   CYSTOSCOPY WITH RETROGRADE PYELOGRAM, URETEROSCOPY AND  STENT PLACEMENT Left 07/29/2017   Procedure: CYSTOSCOPY WITH RETROGRADE PYELOGRAM AND LEFT STENT PLACEMENT;  Surgeon: Matilda Senior, MD;  Location: [facility] ORS;  Service: Urology;  Laterality: Left;   EXTRACORPOREAL SHOCK WAVE LITHOTRIPSY Left 08/08/2017   Procedure: LEFT EXTRACORPOREAL SHOCK WAVE LITHOTRIPSY (ESWL);  Surgeon: Chauncey Redell Agent, MD;  Location: [facility] ORS;  Service: Urology;  Laterality: Left;    LAPAROTOMY N/A 02/07/2022   Procedure: EXPLORATORY LAPAROTOMY;  Surgeon: Signe Mitzie LABOR, MD;  Location: [facility] OR;  Service: General;  Laterality: N/A;   TEE WITHOUT CARDIOVERSION N/A 04/23/2016   Procedure: TRANSESOPHAGEAL ECHOCARDIOGRAM (TEE);  Surgeon: Toribio JONELLE Fuel, MD;  Location: [facility] ENDOSCOPY;  Service: Cardiovascular;  Laterality: N/A;   VENTRAL HERNIA REPAIR N/A 02/07/2022   Procedure: REPAIR INCARCERATED VENTRAL  HERNIA;  Surgeon: Signe Mitzie LABOR, MD;  Location: [facility] OR;  Service: General;  Laterality: N/A;   XI ROBOTIC ASSISTED SIMPLE PROSTATECTOMY N/A 12/25/2018   Procedure: XI ROBOTIC ASSISTED SIMPLE PROSTATECTOMY, REMOVAL OF BLADDER STONES;  Surgeon: Sherrilee Belvie CROME, MD;  Location: [facility] ORS;  Service: Urology;  Laterality: N/A;    Allergies  Allergies  Allergen Reactions   Ibuprofen  Other (See Comments)    Has a-Fib, unable to take   Isosorbide  Other (See Comments)    HEADACHES from Imdur    Lisinopril Cough   Digoxin  And Related Rash   Eplerenone  Rash   Spironolactone  Rash    Home Medications    Prior to Admission medications   Medication Sig Start Date End Date Taking? Authorizing Provider  acetaminophen  (TYLENOL ) 325 MG tablet Take 2 tablets (650 mg total) by mouth every 6 (six) hours as needed for mild pain or headache. 04/27/16   Lesia Ozell Barter, PA-C  albuterol  (PROVENTIL  HFA;VENTOLIN  HFA) 108 (90 Base) MCG/ACT inhaler Inhale 2 puffs into the lungs every 6 (six) hours as needed for wheezing or shortness of breath.    [provider]  amiodarone  (PACERONE ) 200 MG tablet Take 1 tablet (200 mg total) by mouth daily. 05/17/23   Bensimhon, Toribio JONELLE, MD  amLODipine  (NORVASC ) 5 MG tablet Take 5 mg by mouth daily.    [provider]  ELIQUIS  5 MG TABS tablet TAKE 1 TABLET BY MOUTH TWICE A DAY 09/27/23   Bensimhon, Toribio JONELLE, MD  empagliflozin  (JARDIANCE ) 10 MG TABS tablet TAKE 1 TABLET BY MOUTH DAILY BEFORE BREAKFAST. 04/27/23   Bensimhon, Toribio JONELLE, MD   ENTRESTO  97-103 MG TAKE 1 TABLET BY MOUTH 2 (TWO) TIMES DAILY. NEEDS FOLLOW UP APPOINTMENT FOR MORE REFILLS 09/27/23   Bensimhon, Toribio JONELLE, MD  fluticasone -salmeterol (ADVAIR DISKUS) 250-50 MCG/ACT AEPB Inhale 1 puff into the lungs in the morning and at bedtime.    [provider]  furosemide  (LASIX ) 40 MG tablet TAKE ONE TABLET BY MOUTH ON MONDAYS AND FRIDAYS 01/14/23   Bensimhon, Toribio JONELLE, MD  pantoprazole  (PROTONIX ) 40 MG tablet Take 40 mg by mouth daily.    [provider]  rosuvastatin (CRESTOR) 5 MG tablet  08/02/22   [provider]  tadalafil (CIALIS) 5 MG tablet Take 5 mg by mouth daily.    [provider]    Physical Exam    Vital Signs:  MANUEL DALL does not have vital signs available for review today.  Given telephonic nature of communication, physical exam is limited. AAOx3. NAD. Normal affect.  Speech and respirations are unlabored.  Accessory Clinical Findings    None  Assessment & Plan    1.  Preoperative Cardiovascular Risk Assessment:  Mr. Natarajan's perioperative risk of a major cardiac event is 0.9% according to the Revised Cardiac Risk Index (RCRI).  Therefore, he is at low risk for perioperative complications.   His functional capacity is good at 5.62 METs according to the Duke Activity Status Index (DASI). Recommendations: According to ACC/AHA guidelines, no further cardiovascular testing needed.  The patient may proceed to surgery at acceptable risk.   Antiplatelet and/or Anticoagulation Recommendations:  Eliquis  (Apixaban ) can be held for 2 days prior to surgery.  Please resume post op when felt to be safe.     The patient was advised that if he develops new symptoms prior to surgery to contact our office to arrange for a follow-up visit, and he verbalized understanding.  A copy of this note will be routed to requesting surgeon.  Time:   Today, I have spent 9 minutes with the patient with telehealth technology discussing  medical history, symptoms, and management plan.     Orren LOISE Fabry, PA-C  10/06/2023, 3:04 PM

## 2023-10-11 DIAGNOSIS — K529 Noninfective gastroenteritis and colitis, unspecified: Secondary | ICD-10-CM | POA: Diagnosis not present

## 2023-10-11 DIAGNOSIS — H9319 Tinnitus, unspecified ear: Secondary | ICD-10-CM | POA: Diagnosis not present

## 2023-10-11 DIAGNOSIS — E876 Hypokalemia: Secondary | ICD-10-CM | POA: Diagnosis not present

## 2023-10-11 DIAGNOSIS — H6123 Impacted cerumen, bilateral: Secondary | ICD-10-CM | POA: Diagnosis not present

## 2023-10-11 DIAGNOSIS — N289 Disorder of kidney and ureter, unspecified: Secondary | ICD-10-CM | POA: Diagnosis not present

## 2023-12-08 ENCOUNTER — Other Ambulatory Visit (HOSPITAL_COMMUNITY): Payer: Self-pay

## 2023-12-29 ENCOUNTER — Other Ambulatory Visit (HOSPITAL_COMMUNITY): Payer: Self-pay | Admitting: Internal Medicine

## 2024-01-31 ENCOUNTER — Telehealth (HOSPITAL_COMMUNITY): Payer: Self-pay

## 2024-01-31 NOTE — Telephone Encounter (Addendum)
 Advanced Heart Failure Patient Advocate Encounter  The patient was approved for a [employer] grant that will help cover the cost of Eliquis , Entresto , Jardiance .  Total amount awarded, $7,500.  Effective: 01/01/2024 - 12/30/2024.  BIN [id number] PCN [id number] [id number] ID [id number]  Pharmacy provided with approval and processing information. Informed patient by phone.  Rachel DEL, CPhT Rx Patient Advocate Phone: [phone]

## 2024-02-03 ENCOUNTER — Other Ambulatory Visit (HOSPITAL_COMMUNITY): Payer: Self-pay

## 2024-02-03 MED ORDER — SACUBITRIL-VALSARTAN 97-103 MG PO TABS: 1.0000 | ORAL_TABLET | Freq: Two times a day (BID) | ORAL | 2 refills | Status: DC

## 2024-02-03 MED ORDER — EMPAGLIFLOZIN 10 MG PO TABS: 10.0000 mg | ORAL_TABLET | Freq: Every day | ORAL | 2 refills | Status: AC

## 2024-02-03 MED ORDER — SACUBITRIL-VALSARTAN 97-103 MG PO TABS
1.0000 | ORAL_TABLET | Freq: Two times a day (BID) | ORAL | 2 refills | Status: AC
  Filled 2024-02-03 (×2): qty 60, 30d supply, fill #0

## 2024-02-03 MED ORDER — APIXABAN 5 MG PO TABS
5.0000 mg | ORAL_TABLET | Freq: Two times a day (BID) | ORAL | 2 refills | Status: AC
  Filled 2024-02-03 (×2): qty 60, 30d supply, fill #0

## 2024-02-03 MED ORDER — APIXABAN 5 MG PO TABS: 5.0000 mg | ORAL_TABLET | Freq: Two times a day (BID) | ORAL | 2 refills | Status: DC

## 2024-02-03 NOTE — Addendum Note (Signed)
 Addended by: Parris Signer, DALTON HERO on: 02/03/2024 03:52 PM   Modules accepted: Orders

## 2024-02-03 NOTE — Addendum Note (Signed)
 Addended by: Jemar Paulsen, DALTON HERO on: 02/03/2024 10:26 AM   Modules accepted: Orders

## 2024-02-21 ENCOUNTER — Other Ambulatory Visit (HOSPITAL_COMMUNITY): Payer: Self-pay
